# Patient Record
Sex: Female | Born: 1950
Health system: Southern US, Community
[De-identification: ages and names within clinical notes are randomized; demographics above are authoritative.]

## PROBLEM LIST (undated history)

## (undated) DIAGNOSIS — I509 Heart failure, unspecified: Secondary | ICD-10-CM

## (undated) DIAGNOSIS — I350 Nonrheumatic aortic (valve) stenosis: Secondary | ICD-10-CM

## (undated) DIAGNOSIS — I4891 Unspecified atrial fibrillation: Secondary | ICD-10-CM

## (undated) DIAGNOSIS — I4819 Other persistent atrial fibrillation: Secondary | ICD-10-CM

## (undated) DIAGNOSIS — I480 Paroxysmal atrial fibrillation: Secondary | ICD-10-CM

## (undated) DIAGNOSIS — T7840XA Allergy, unspecified, initial encounter: Secondary | ICD-10-CM

## (undated) DIAGNOSIS — R011 Cardiac murmur, unspecified: Secondary | ICD-10-CM

## (undated) DIAGNOSIS — I428 Other cardiomyopathies: Secondary | ICD-10-CM

## (undated) DIAGNOSIS — I10 Essential (primary) hypertension: Secondary | ICD-10-CM

## (undated) DIAGNOSIS — N189 Chronic kidney disease, unspecified: Secondary | ICD-10-CM

## (undated) HISTORY — DX: Essential (primary) hypertension: I10

## (undated) HISTORY — DX: Allergy, unspecified, initial encounter: T78.40XA

## (undated) HISTORY — DX: Chronic kidney disease, unspecified: N18.9

## (undated) HISTORY — DX: Other cardiomyopathies: I42.8

## (undated) HISTORY — DX: Other persistent atrial fibrillation: I48.19

## (undated) HISTORY — PX: TONSILLECTOMY: SUR1361

## (undated) HISTORY — DX: Paroxysmal atrial fibrillation: I48.0

## (undated) HISTORY — DX: Nonrheumatic aortic (valve) stenosis: I35.0

## (undated) HISTORY — DX: Cardiac murmur, unspecified: R01.1

---
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
## 1998-09-09 ENCOUNTER — Emergency Department (HOSPITAL_COMMUNITY): Admission: EM | Admit: 1998-09-09 | Discharge: 1998-09-09 | Payer: Self-pay | Admitting: Emergency Medicine

## 2004-11-23 ENCOUNTER — Ambulatory Visit: Payer: Self-pay

## 2005-01-05 ENCOUNTER — Ambulatory Visit: Payer: Self-pay

## 2005-01-05 IMAGING — MG UNKNOWN MG STUDY
1 series · 4 of 4 positions shown · non-contrast
Comparison: none

REASON FOR EXAM: screening
COMMENTS:

[R CC · right · 4 of 4 slices shown]
[im 1/4]
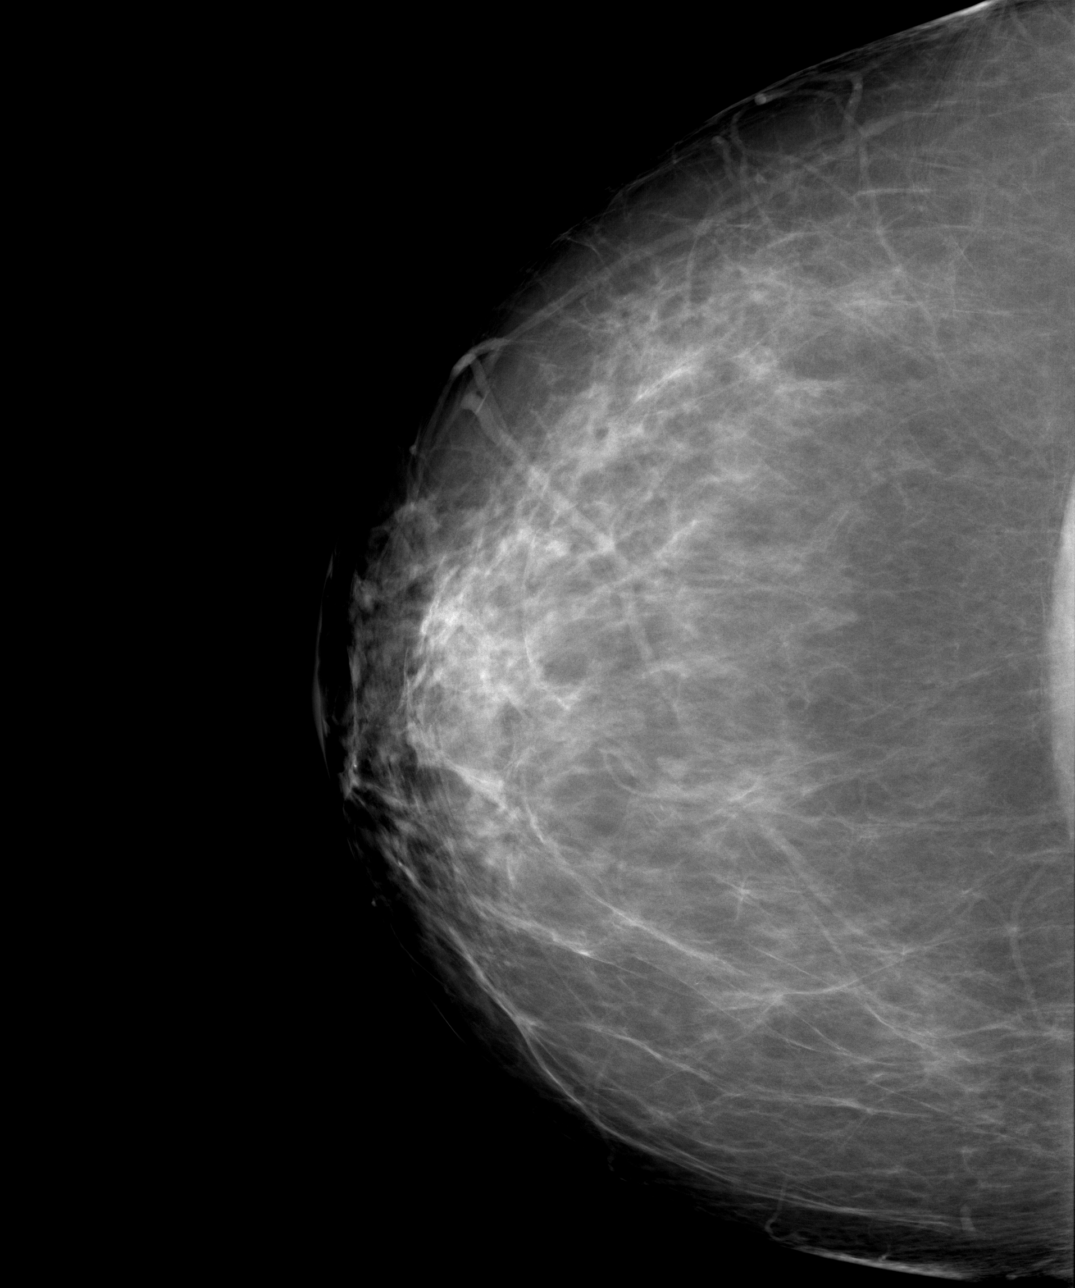
[im 2/4]
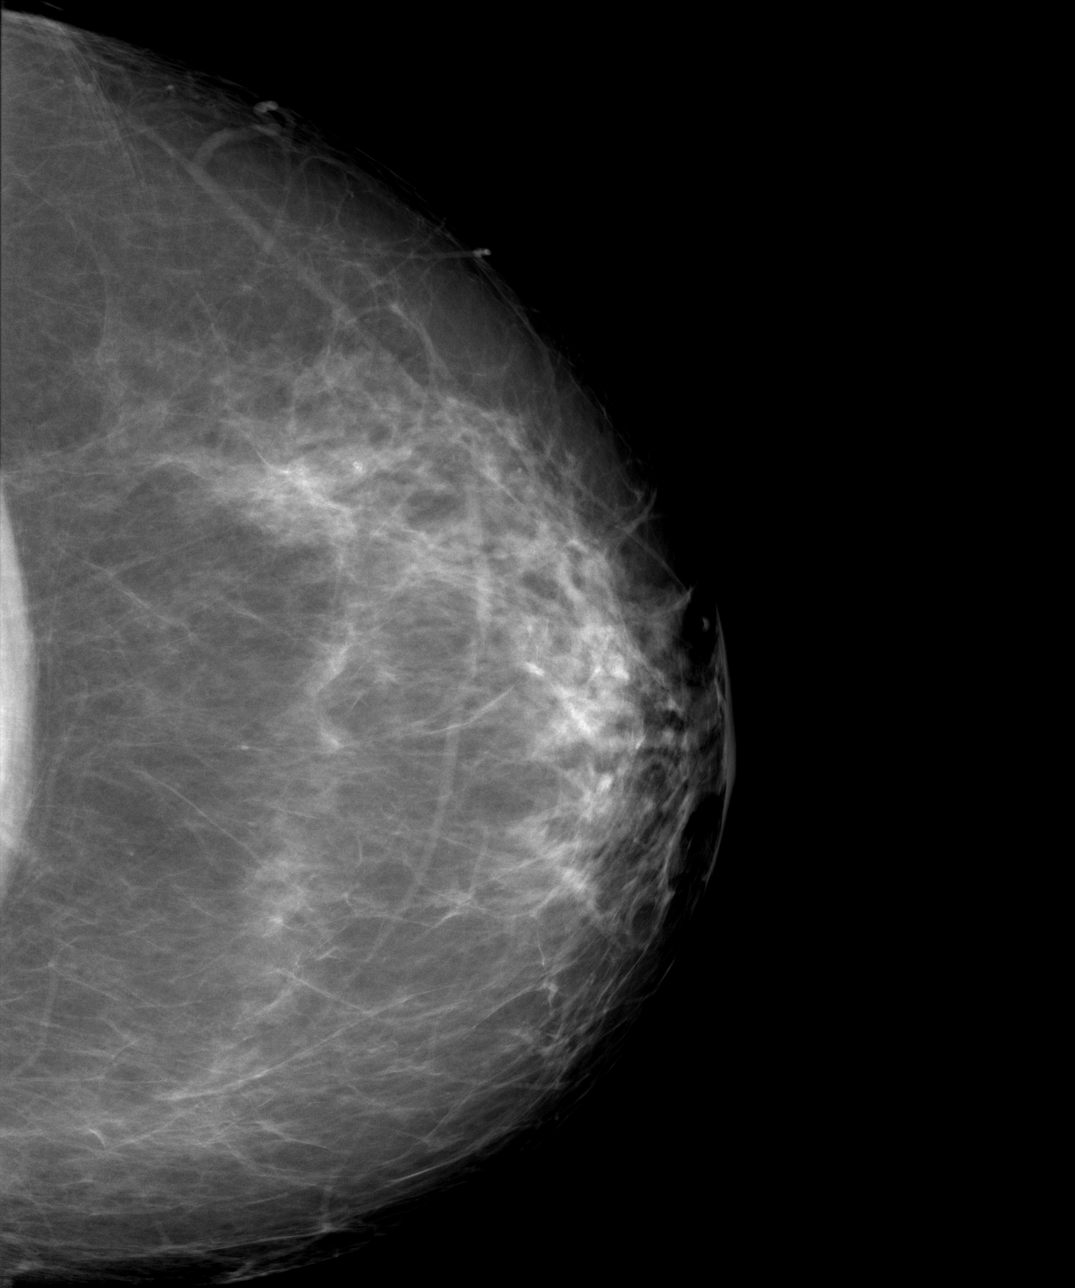
[im 3/4]
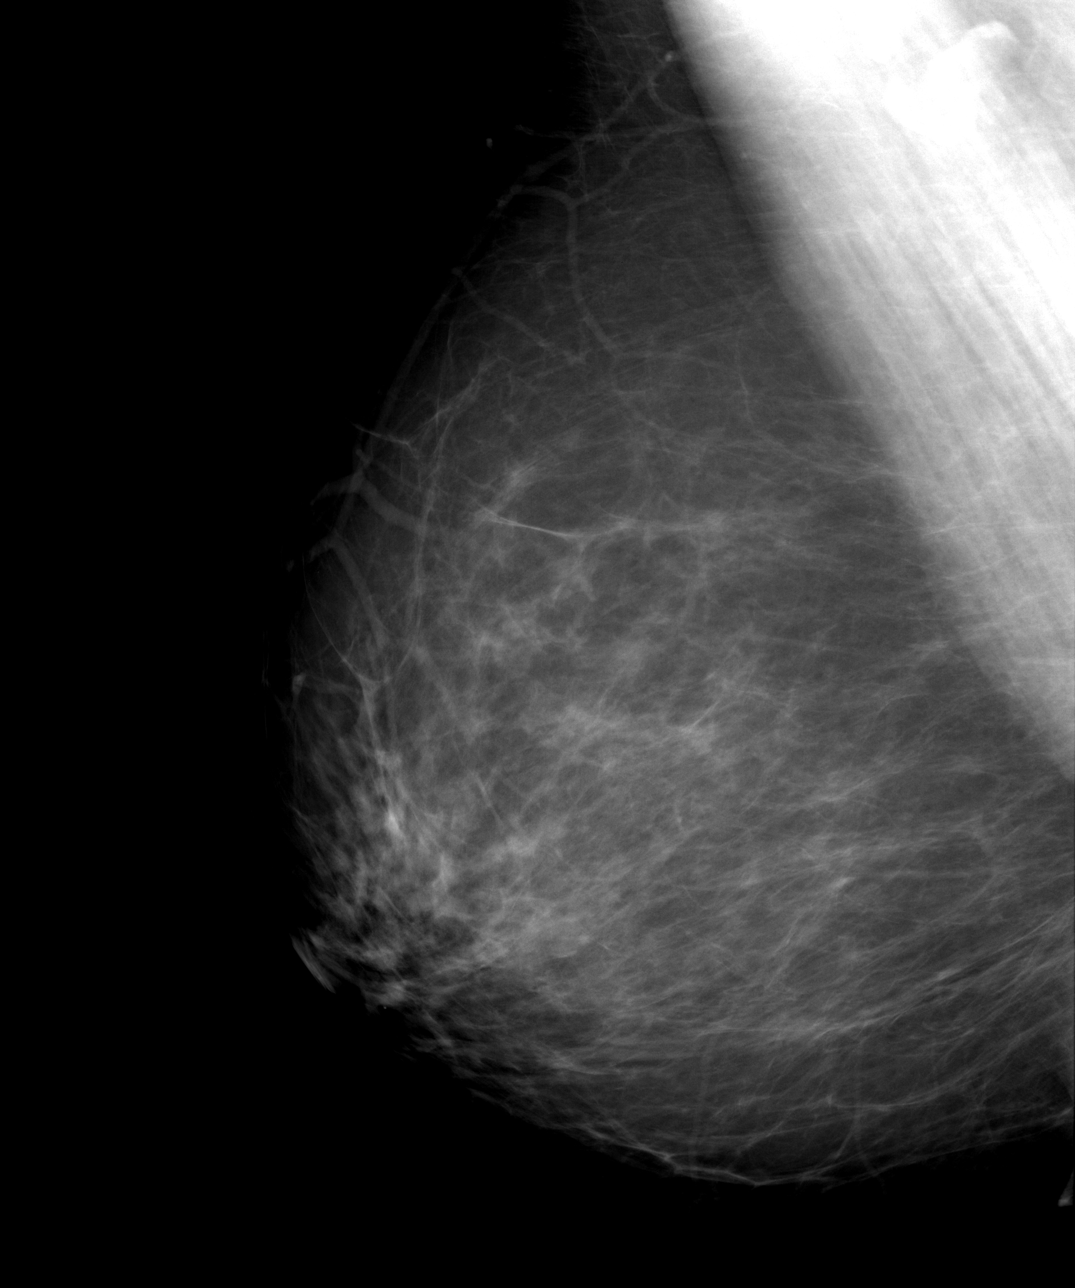
[im 4/4]
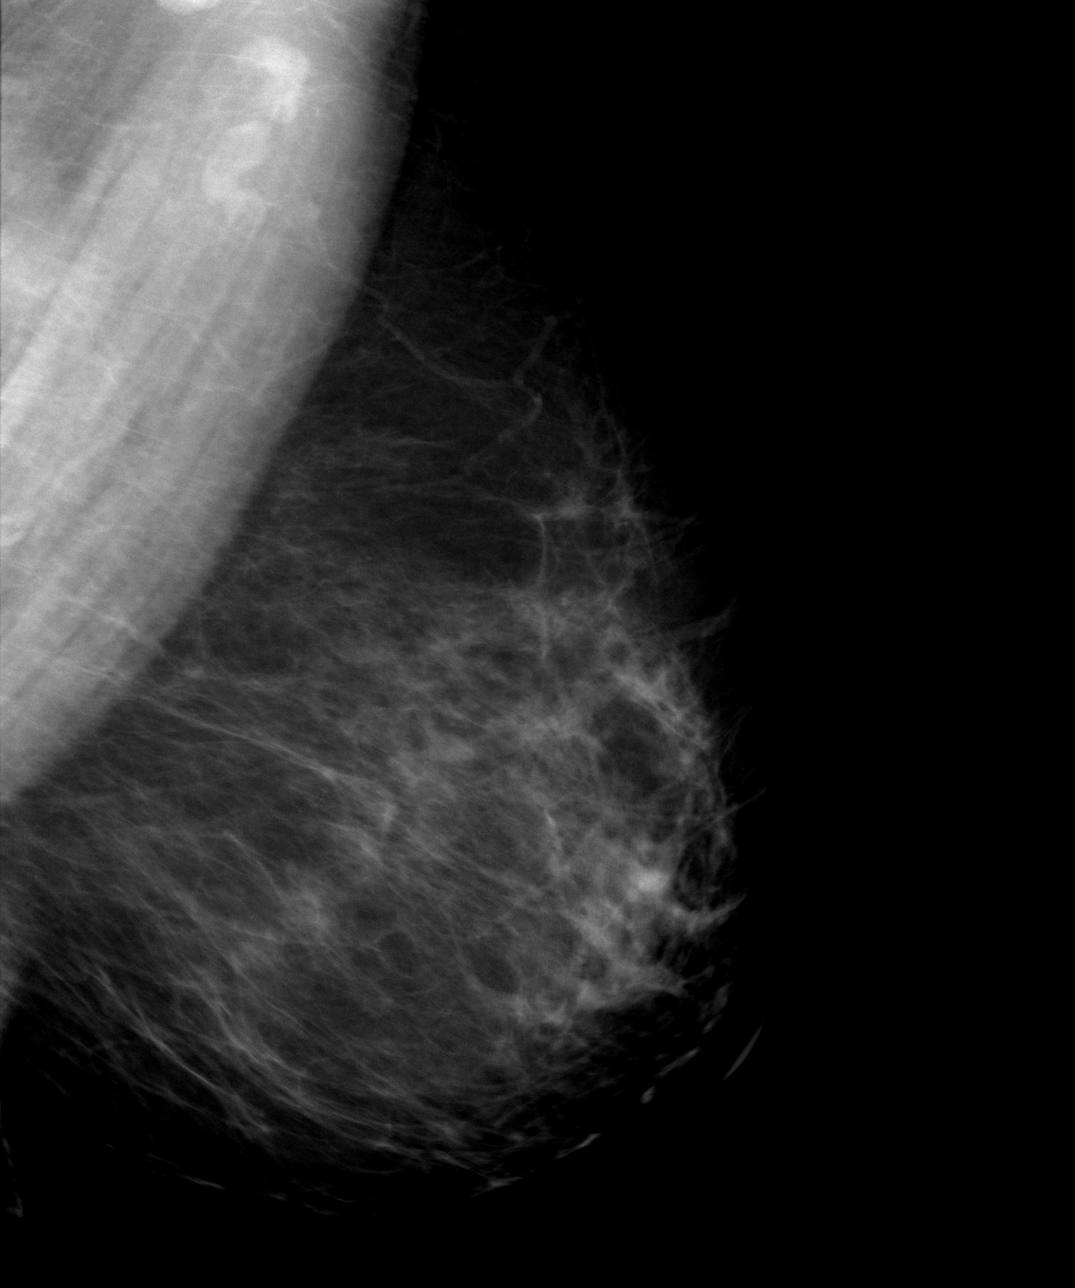

[4 of 4 positions shown; findings below may reference images not displayed]

PROCEDURE:     MAM - MAM DGTL SCREENING MAMMO W/CAD  - [DATE]  [DATE]

RESULT:     Comparison is made to the prior study of [DATE].  The breasts
exhibit a mildly dense parenchymal pattern. There is no evidence of an area
of developing density, dominant mass, or malignant appearing calcification.
The appearance is stable.
IMPRESSION: 1)Stable, benign appearing bilateral mammogram.

BI-RADS: Category 2-Benign Finding

RECOMMENDATIONS:

1)Please continue to encourage yearly mammographic follow-up.

A NEGATIVE MAMMOGRAM REPORT DOES NOT PRECLUDE BIOPSY OR OTHER EVALUATION OF
A CLINICALLY PALPABLE OR OTHERWISE SUSPICIOUS MASS OR LESION.  BREAST CANCER
MAY NOT BE DETECTED BY MAMMOGRAPHY IN UP TO 10% OF CASES.

## 2005-09-06 ENCOUNTER — Ambulatory Visit: Payer: Self-pay

## 2005-09-06 IMAGING — CR DG CHEST 2V
1 series · 2 of 2 positions shown · non-contrast
Comparison: none

REASON FOR EXAM: PAIN , COUGH, CONGESTION
COMMENTS:

[Series 1: view not recorded · 0.17mm/px · 2 of 2 slices shown]
[im 1/2]
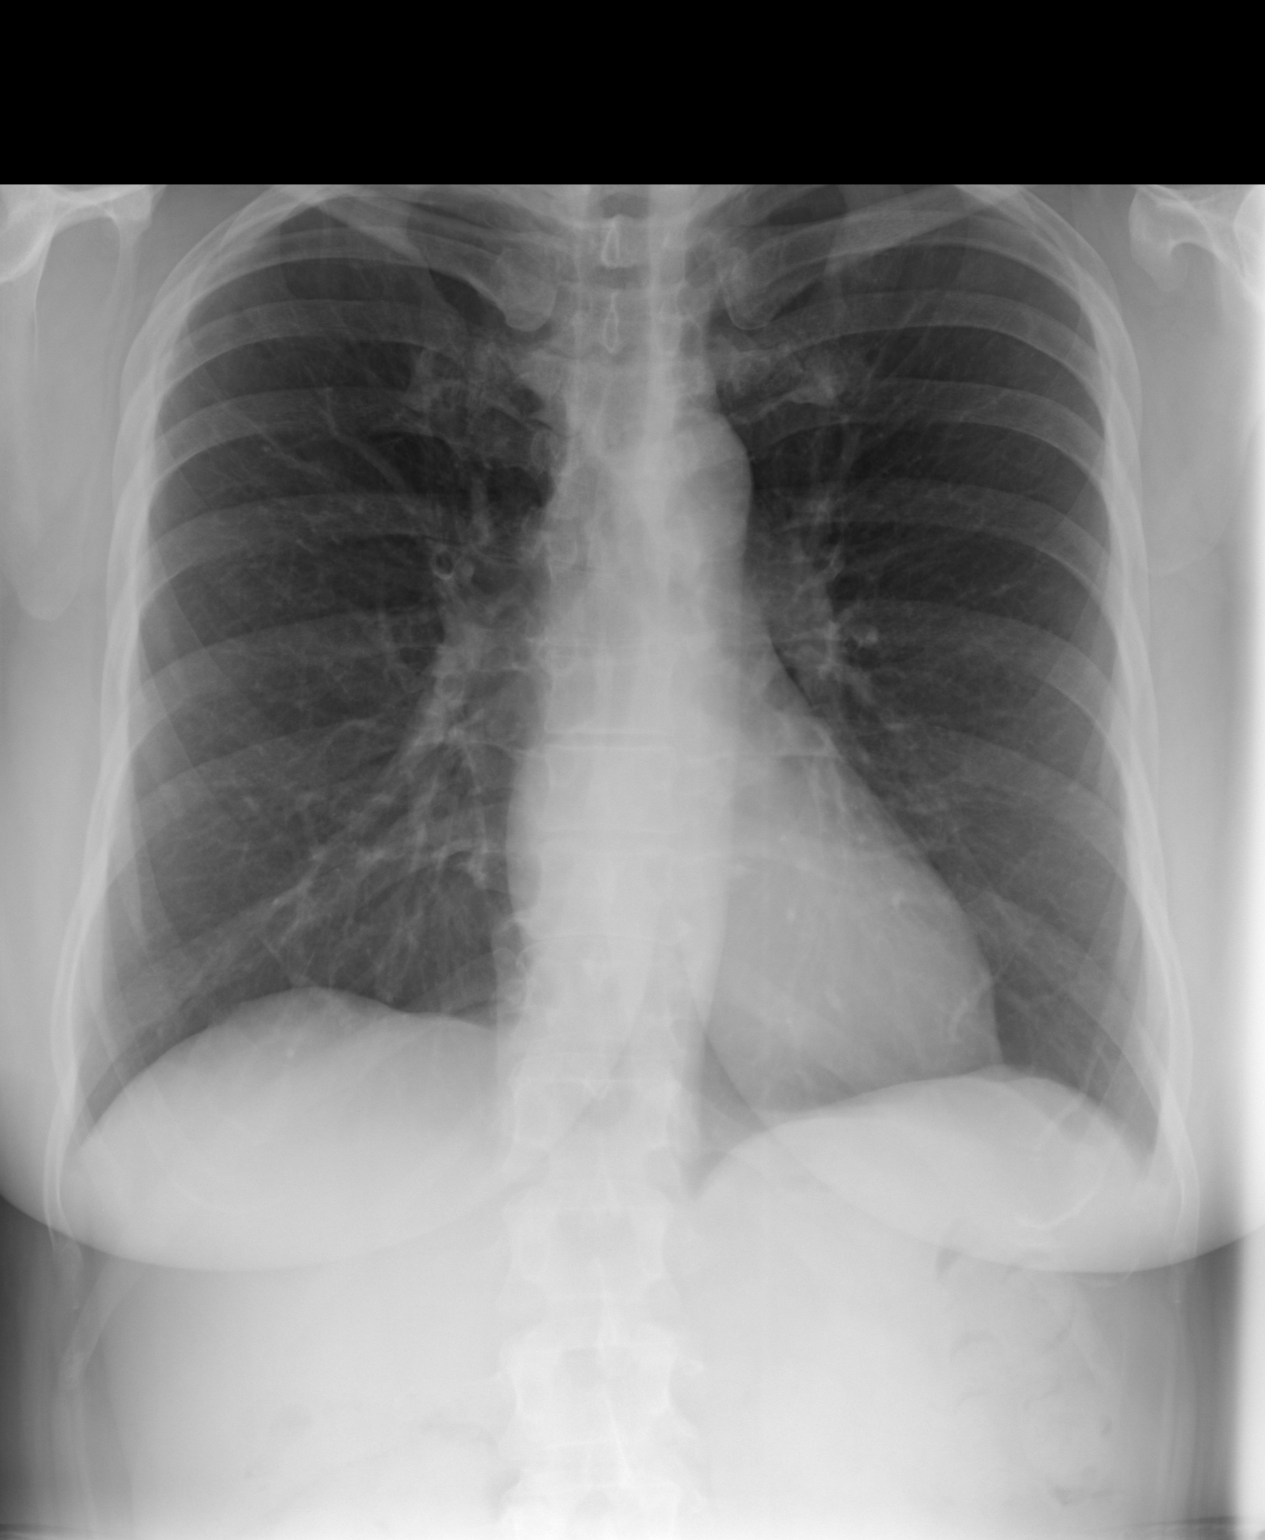
[im 2/2]
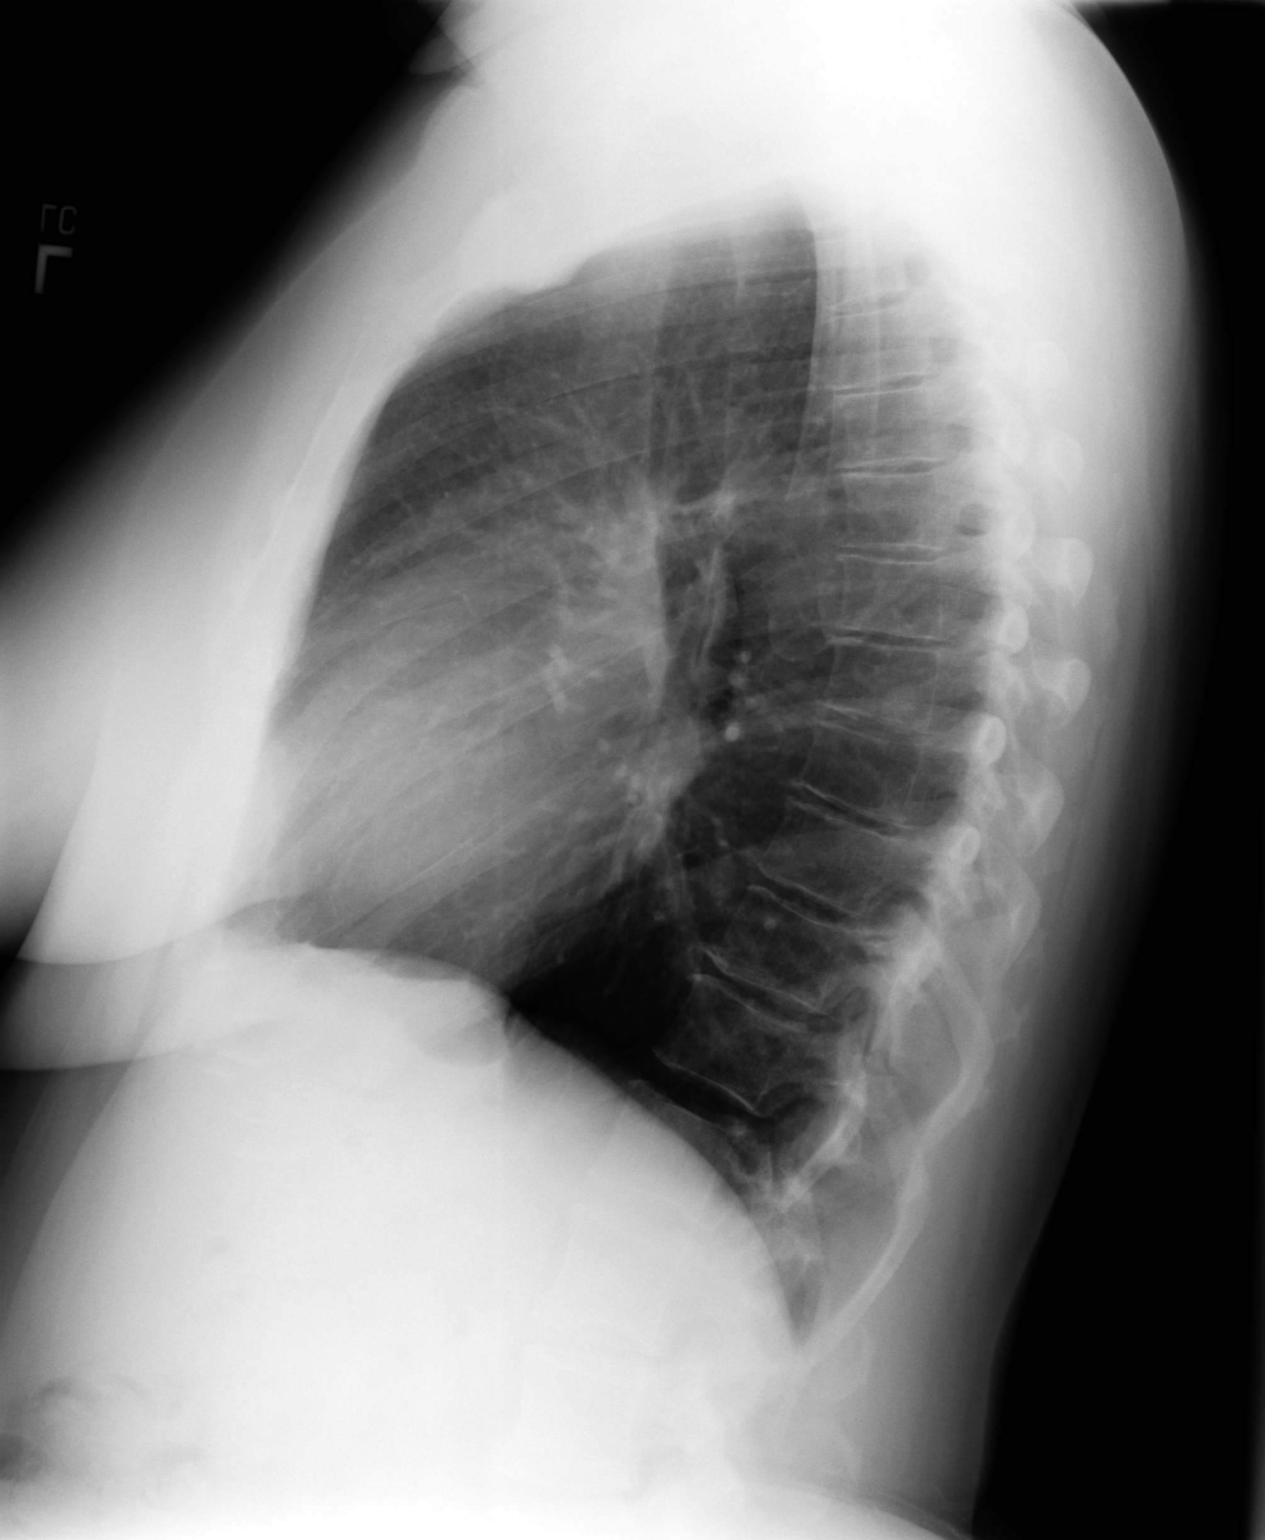

[2 of 2 positions shown; findings below may reference images not displayed]

PROCEDURE:     DXR - DXR CHEST PA (OR AP) AND LATERAL  - [DATE]  [DATE]

RESULT:     Two views of the chest show the lungs are clear. The heart and
pulmonary vessels are normal. The bony structures are unremarkable. There is
some slight prominence of the lung markings in the RIGHT lung base which
could represent minimal atelectasis or fibrosis but there is no definite
infiltrate.
IMPRESSION: No acute cardiopulmonary disease. I cannot exclude some
RIGHT basilar atelectasis or fibrosis.

## 2005-09-06 IMAGING — CR PARANASAL SINUSES - COMPLETE 3 + VIEW
1 series · 5 of 5 positions shown · non-contrast
Comparison: none

REASON FOR EXAM: PAIN,COUGH, CONGESTION
COMMENTS:

[Series 1: view not recorded · 0.17mm/px · 5 of 5 slices shown]
[im 1/5]
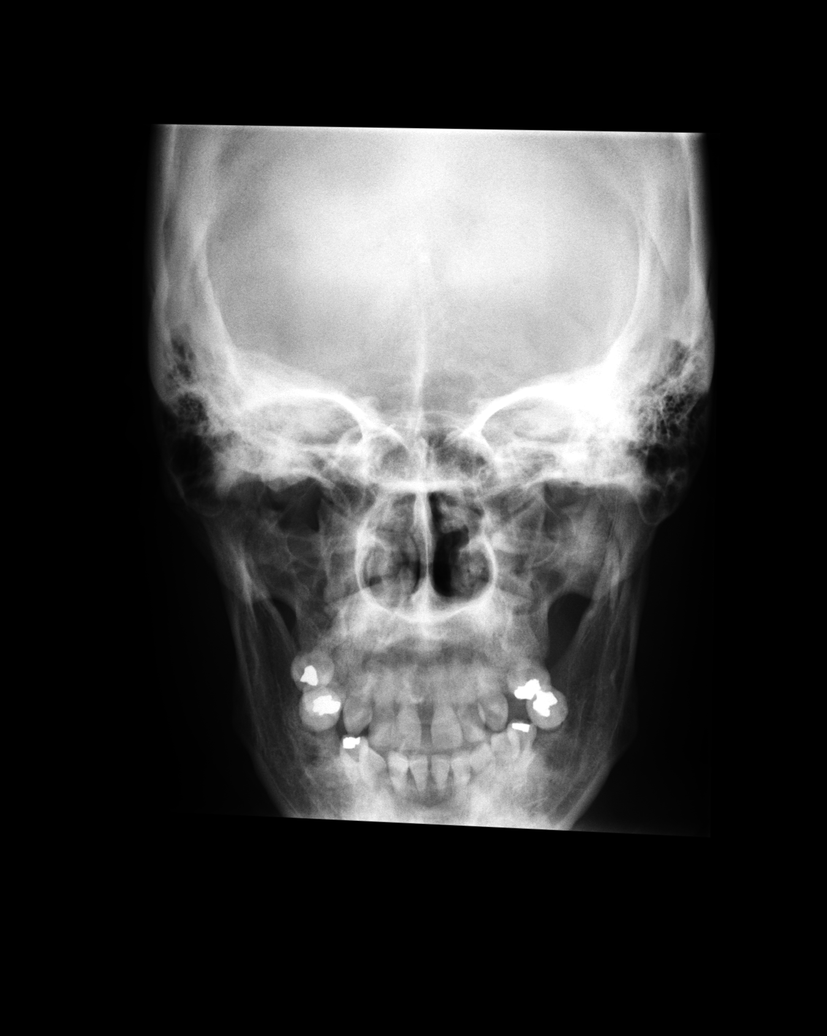
[im 2/5]
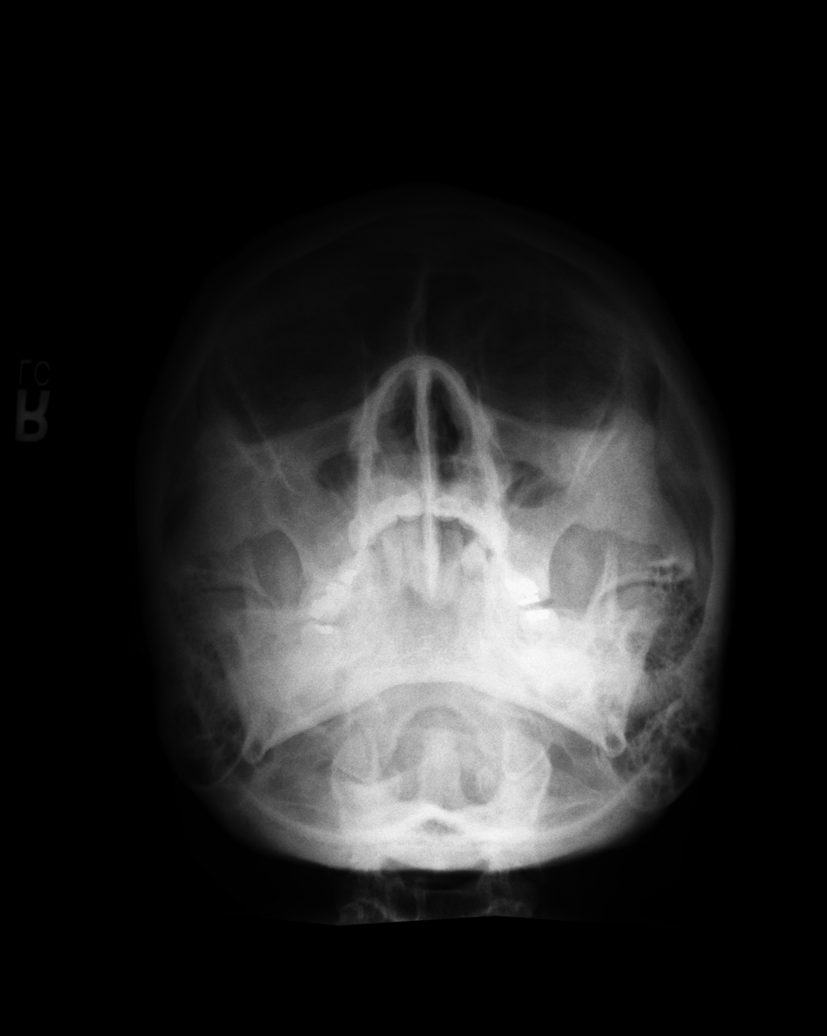
[im 3/5]
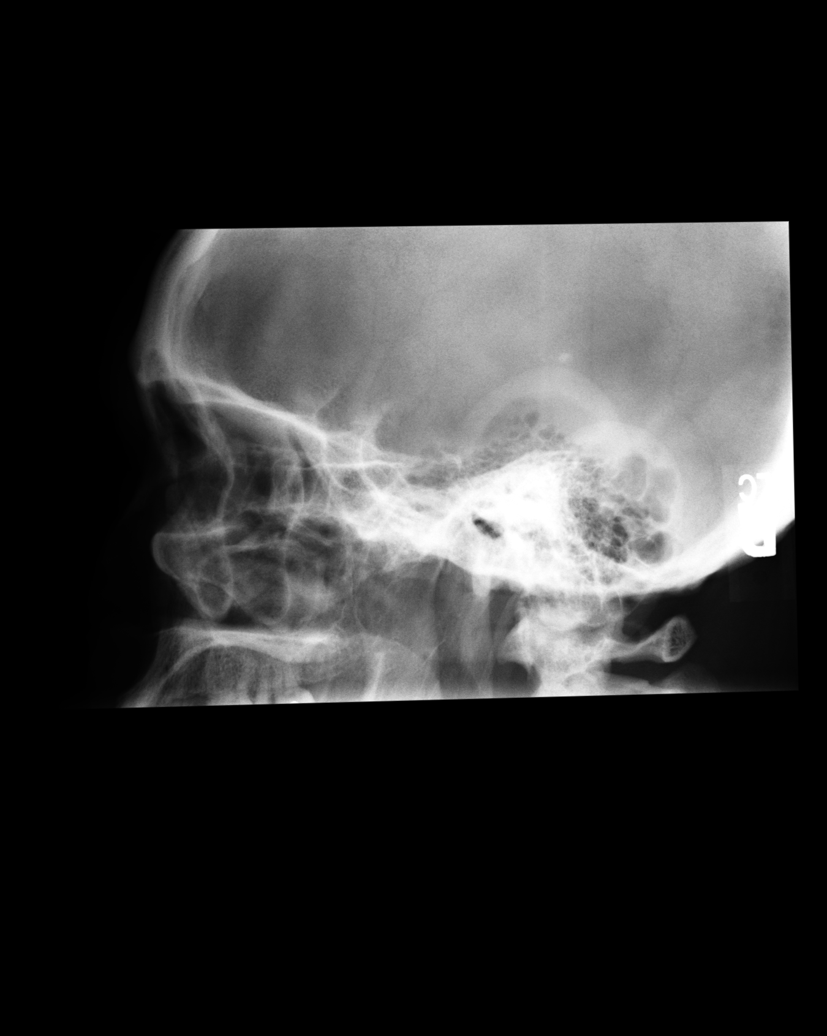
[im 4/5]
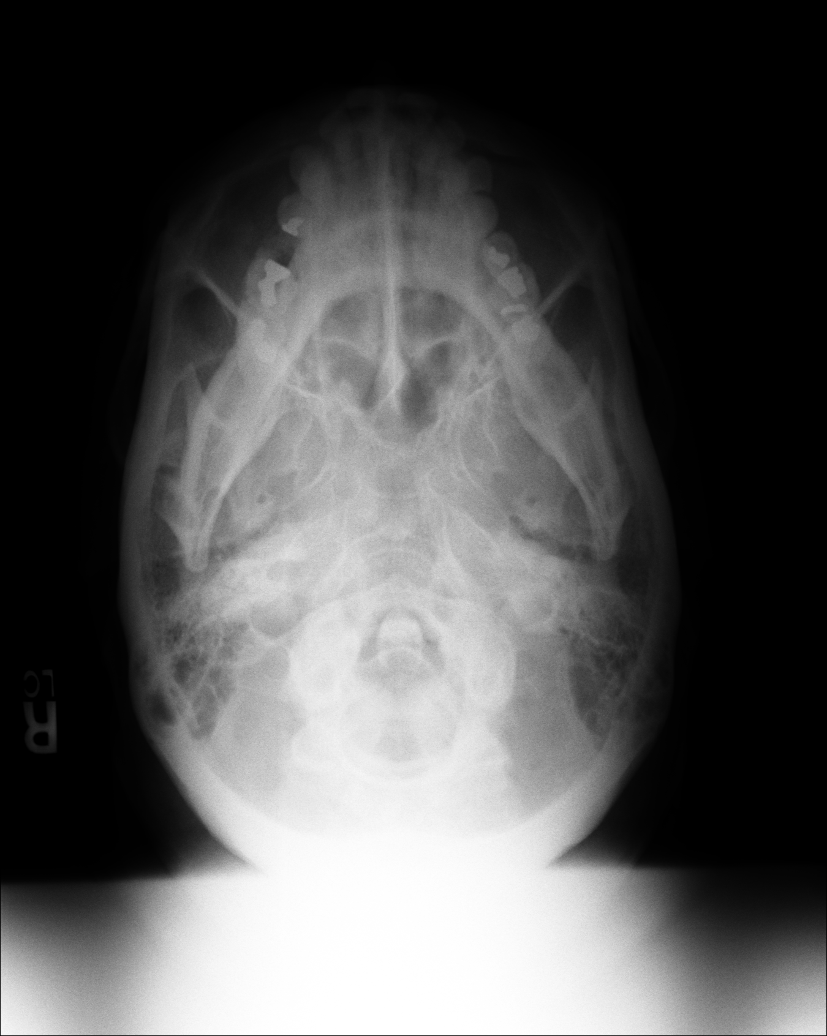
[im 5/5]
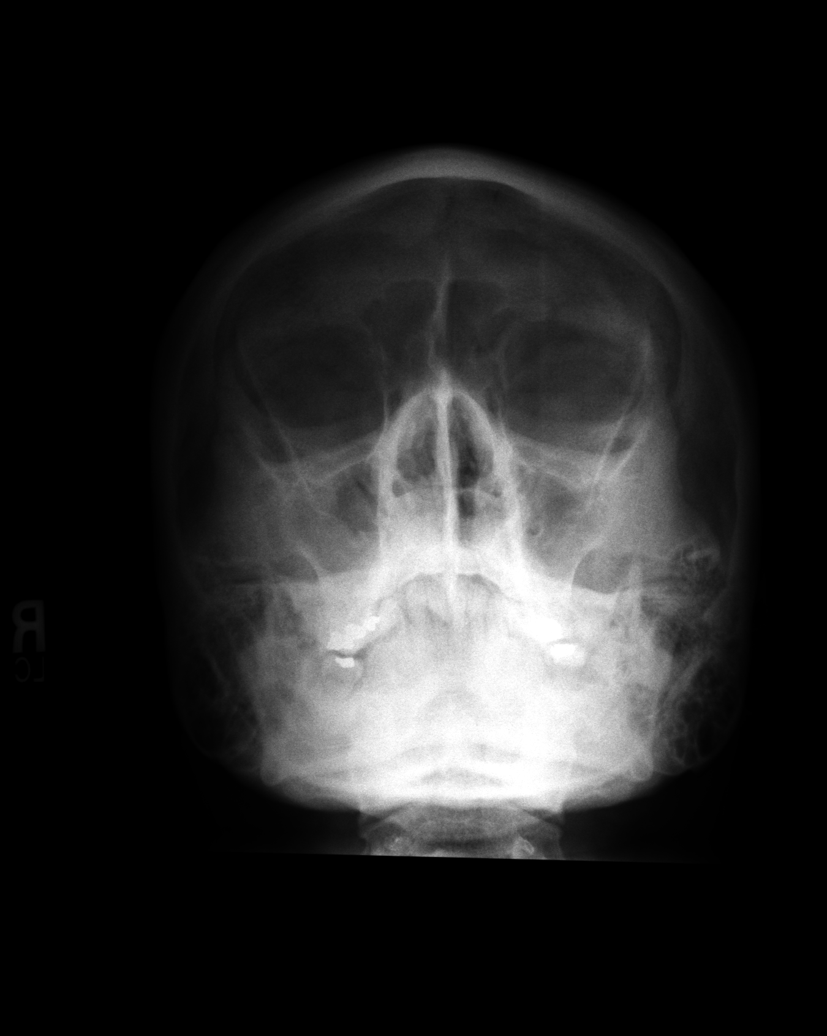

[5 of 5 positions shown; findings below may reference images not displayed]

PROCEDURE:     DXR - DXR SINUSES PARANASAL COMPLETE  - [DATE]  [DATE]

RESULT:        Routine sinus series demonstrates evidence of mild mucosal
thickening in the maxillary sinuses.  Sinusitis cannot be excluded.  If
there is continued clinical concern,  then correlation with CT of the
sinuses would be suggested.
IMPRESSION: See above.

## 2006-06-28 ENCOUNTER — Ambulatory Visit: Payer: Self-pay

## 2006-06-28 ENCOUNTER — Ambulatory Visit: Payer: Self-pay | Admitting: Internal Medicine

## 2006-06-28 IMAGING — MG UNKNOWN MG STUDY
1 series · 4 of 4 positions shown · non-contrast
Comparison: none

REASON FOR EXAM: screening mammogram
COMMENTS:

[Series 6266: R CC · right · 4 of 4 slices shown]
[im 1/4]
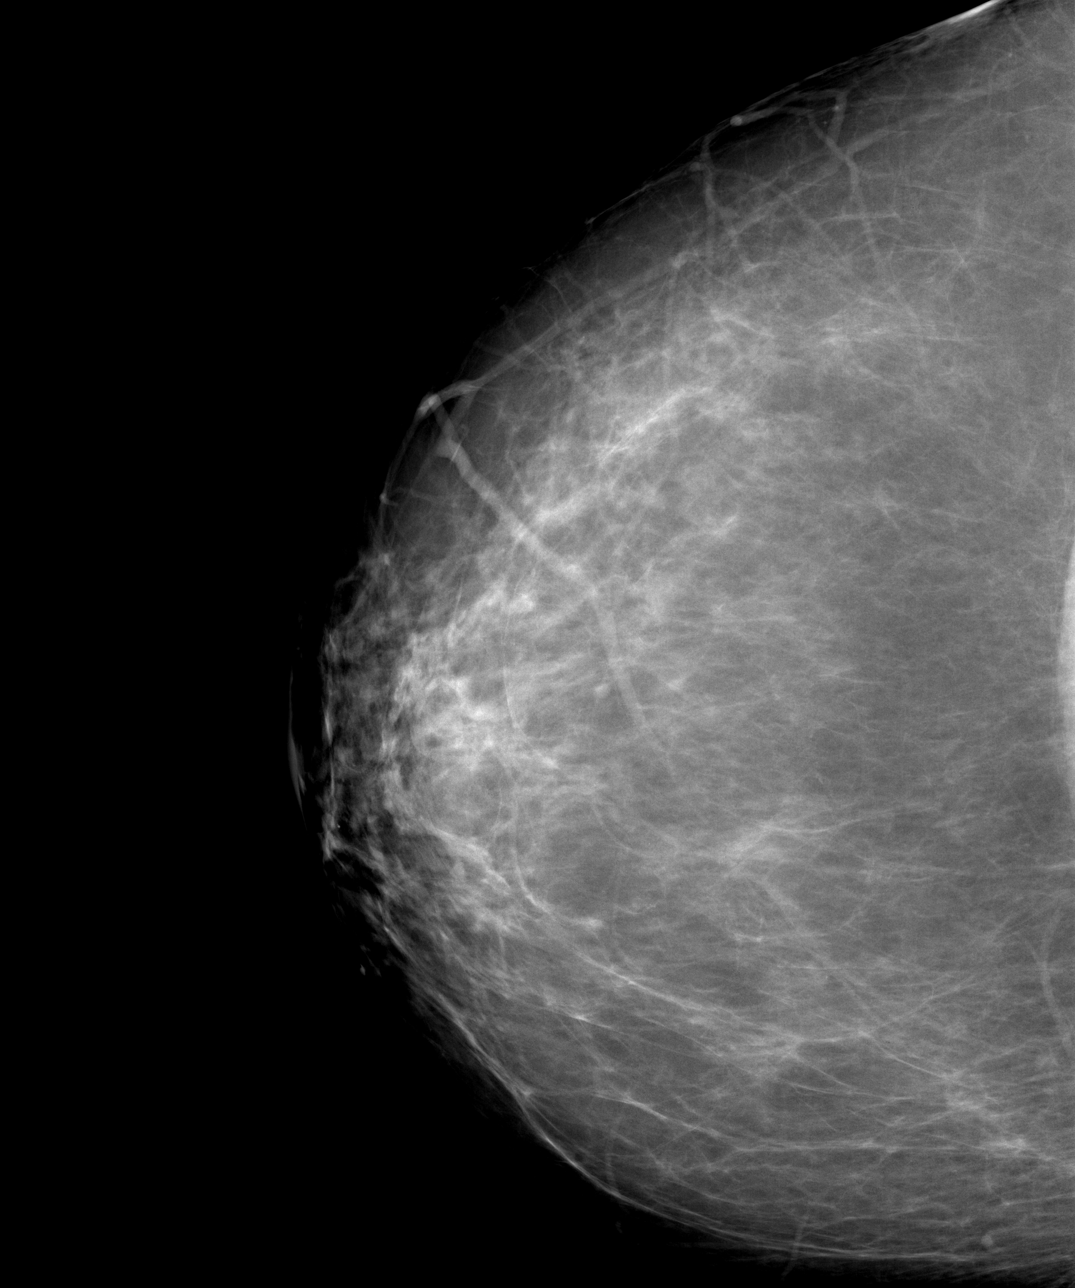
[im 2/4]
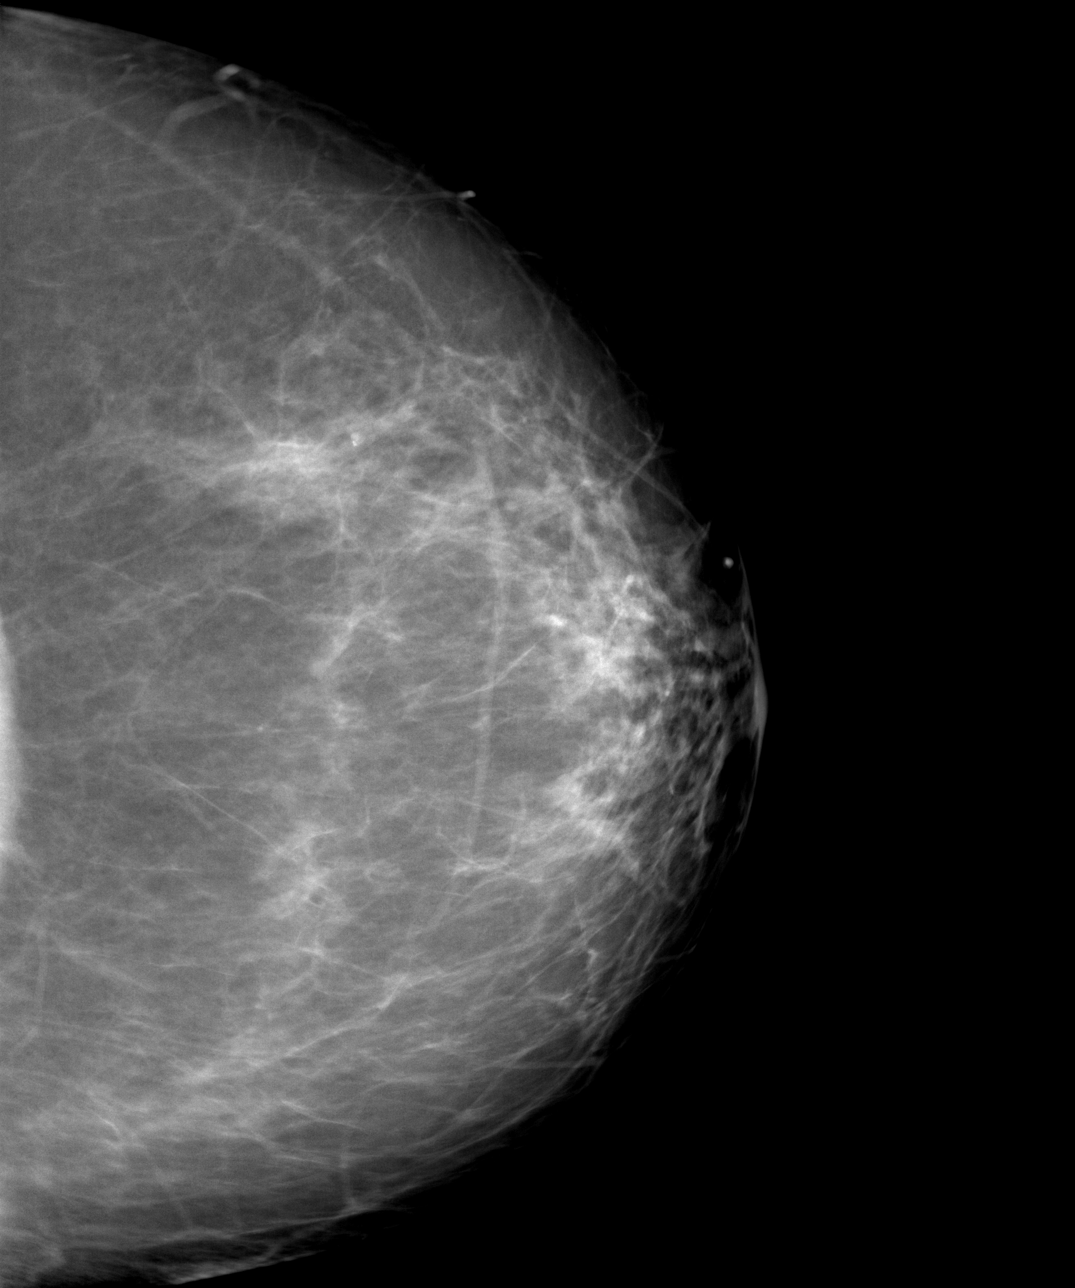
[im 3/4]
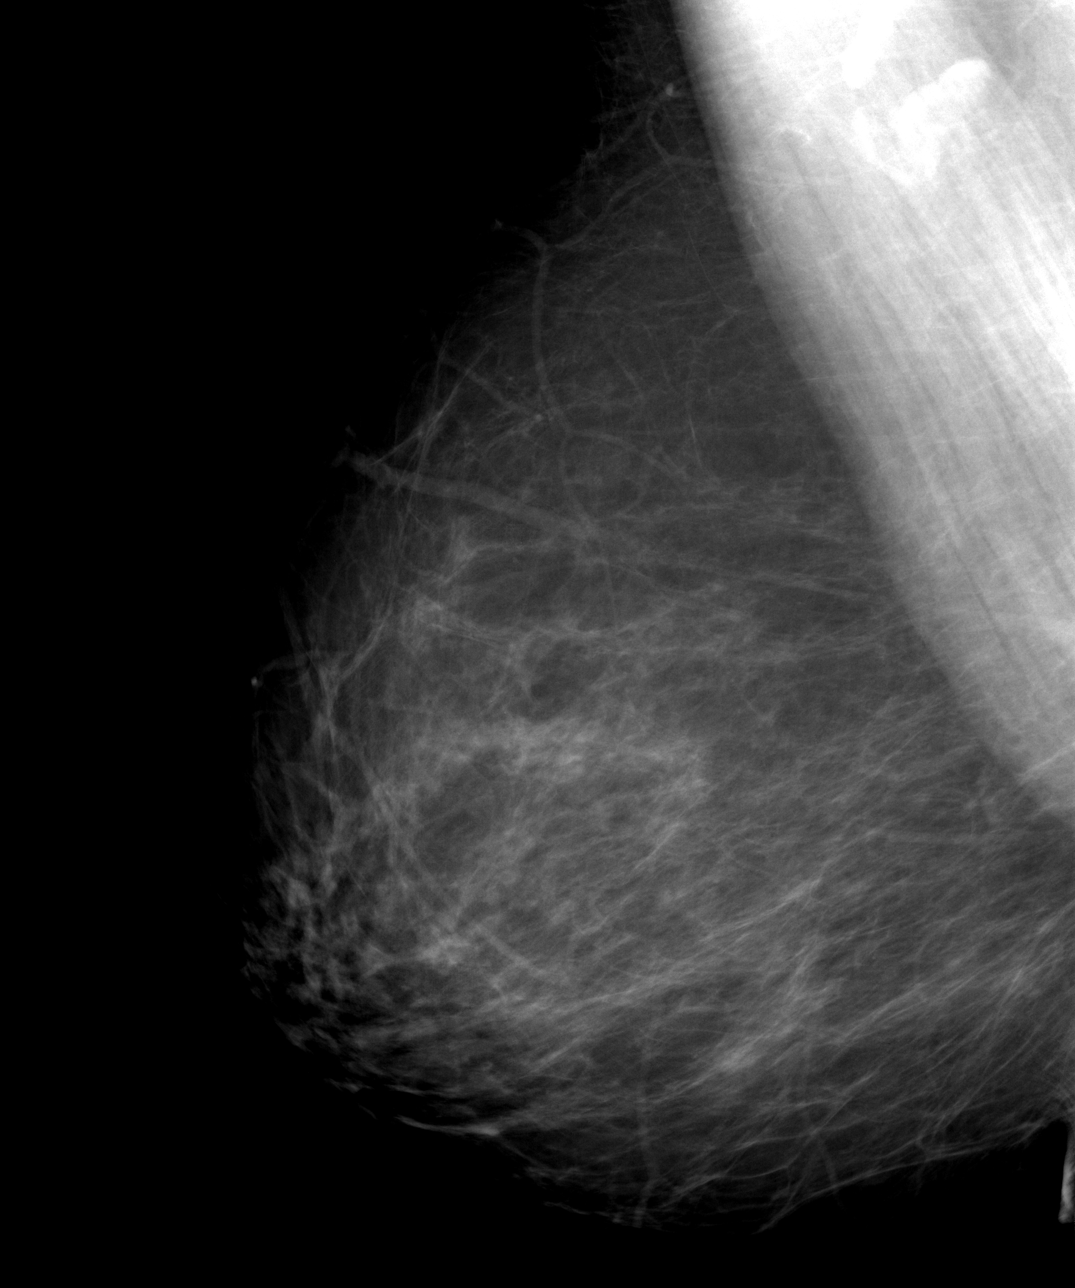
[im 4/4]
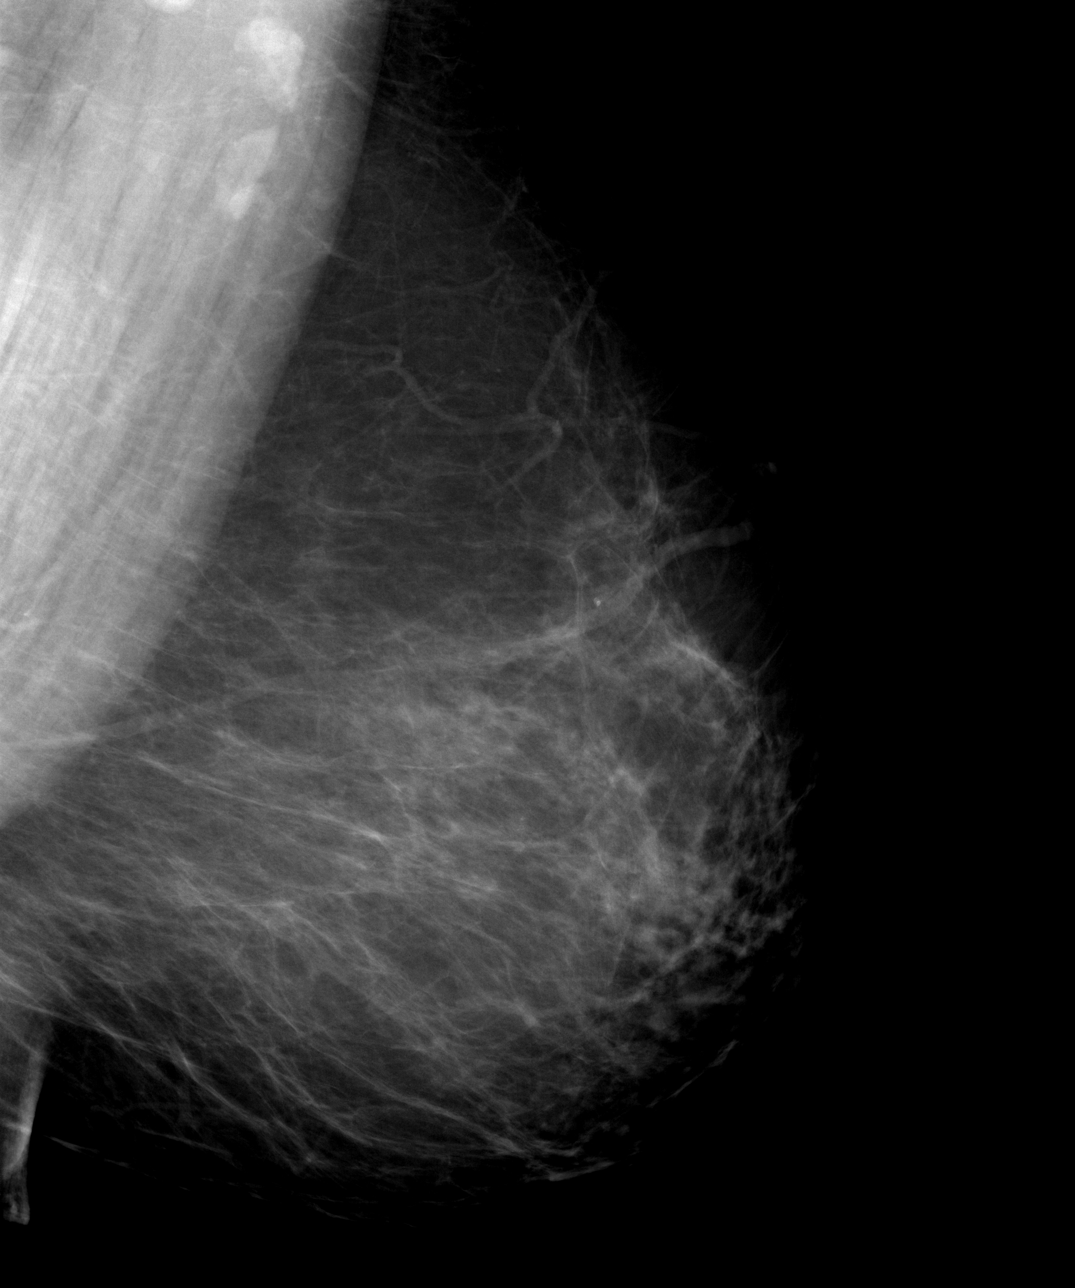

[4 of 4 positions shown; findings below may reference images not displayed]

PROCEDURE:     MAM - MAM DGTL SCREENING MAMMO W/CAD  - [DATE]  [DATE]

RESULT:          Comparison is made to the film-screen study of [DATE]
and to previous digital exam of [DATE].

The breasts exhibit a moderately dense parenchymal pattern and show no area
of developing density, dominant mass, or malignant-appearing calcification.
The appearance is stable.  There is no architectural distortion.
IMPRESSION: Stable, benign-appearing bilateral mammogram. Please
continue to encourage annual mammographic followup.

BI-RADS:  Category 2 - Benign finding

A NEGATIVE MAMMOGRAM REPORT DOES NOT PRECLUDE BIOPSY OR OTHER EVALUATION OF
A CLINICALLY PALPABLE OR OTHERWISE SUSPICIOUS MASS OR LESION.  BREAST CANCER
MAY NOT BE DETECTED BY MAMMOGRAPHY IN UP TO 10% OF CASES.

## 2006-07-10 ENCOUNTER — Ambulatory Visit: Payer: Self-pay | Admitting: Internal Medicine

## 2008-09-03 ENCOUNTER — Ambulatory Visit: Payer: Self-pay

## 2008-09-03 IMAGING — MG MAM DGTL SCREENING MAMMO W/CAD
1 series · 7 of 7 positions shown · non-contrast
Comparison: none

REASON FOR EXAM: screening mammo
COMMENTS:

[R CC · right · 7 of 7 slices shown]
[im 1/7]
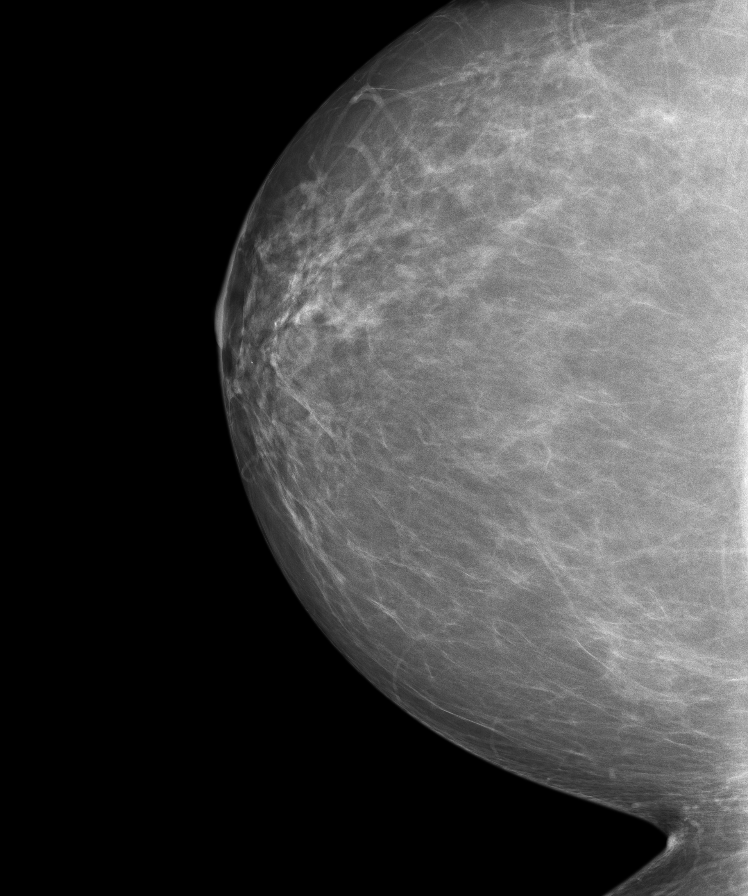
[im 2/7]
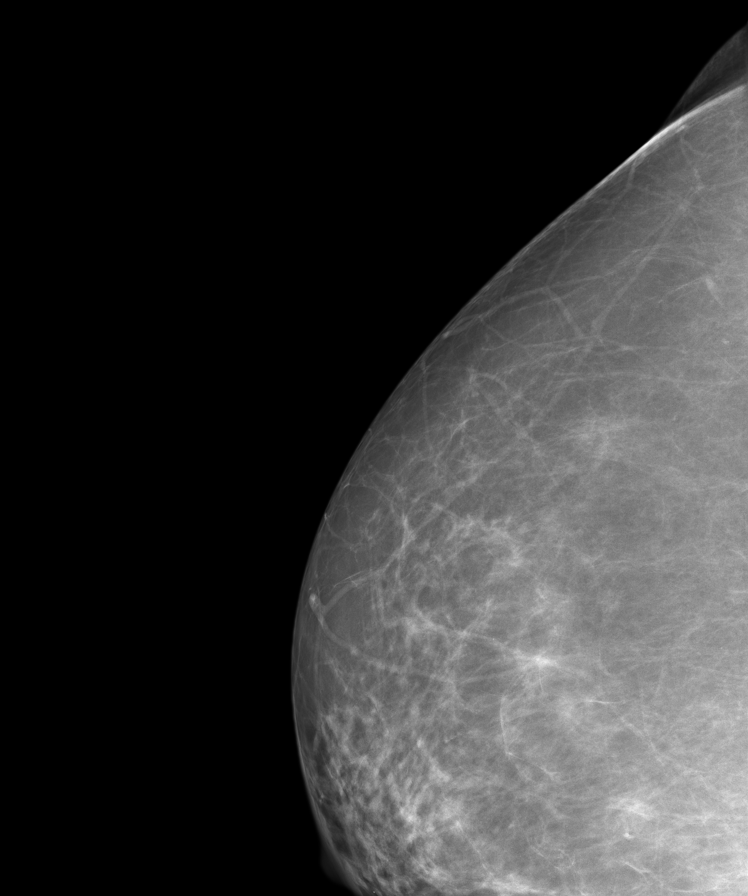
[im 3/7]
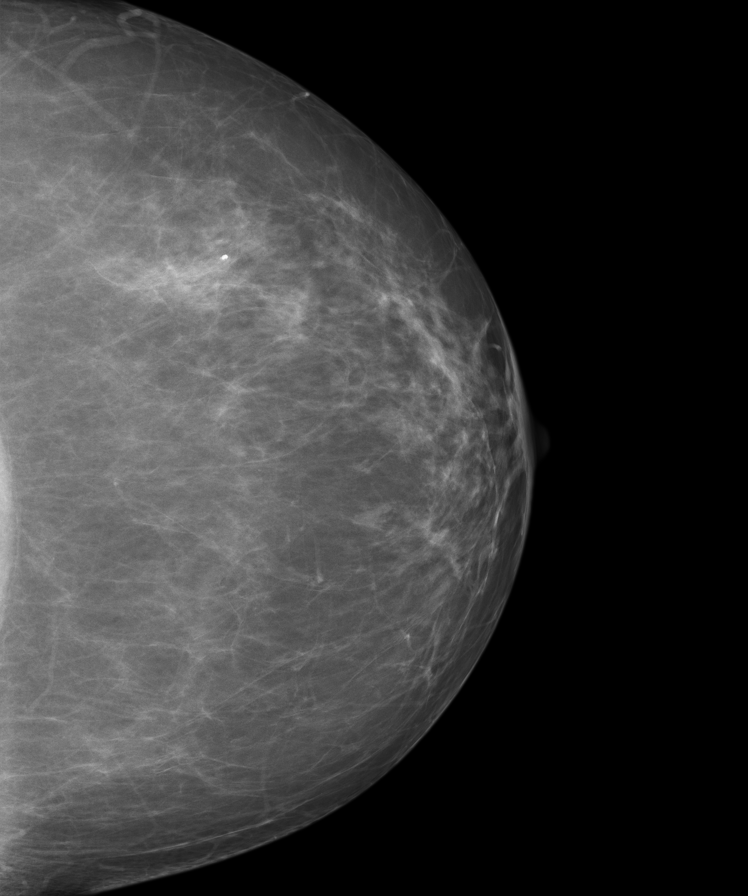
[im 4/7]
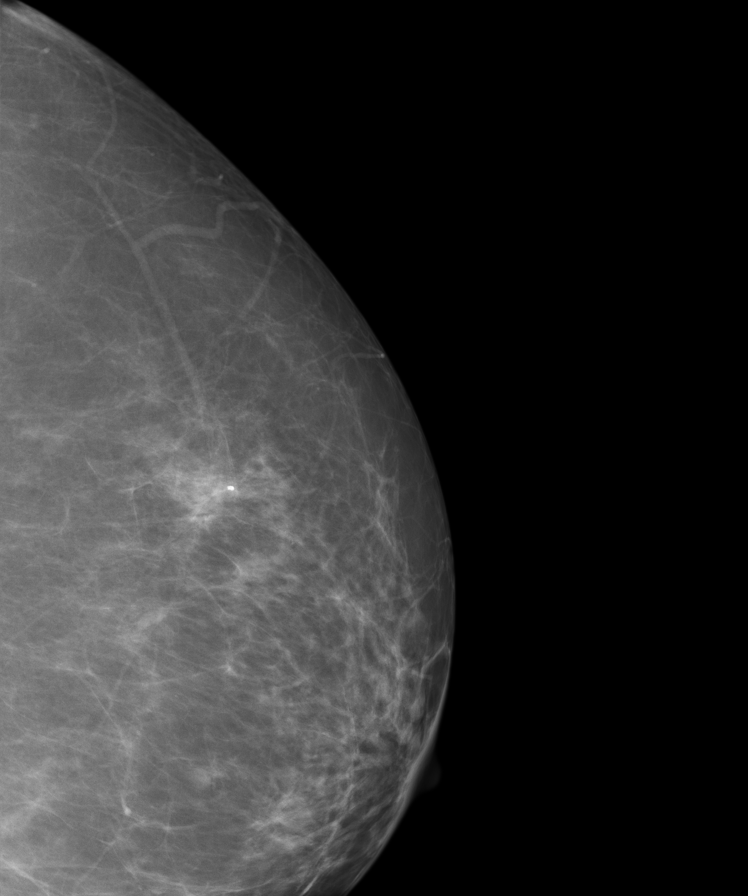
[im 5/7]
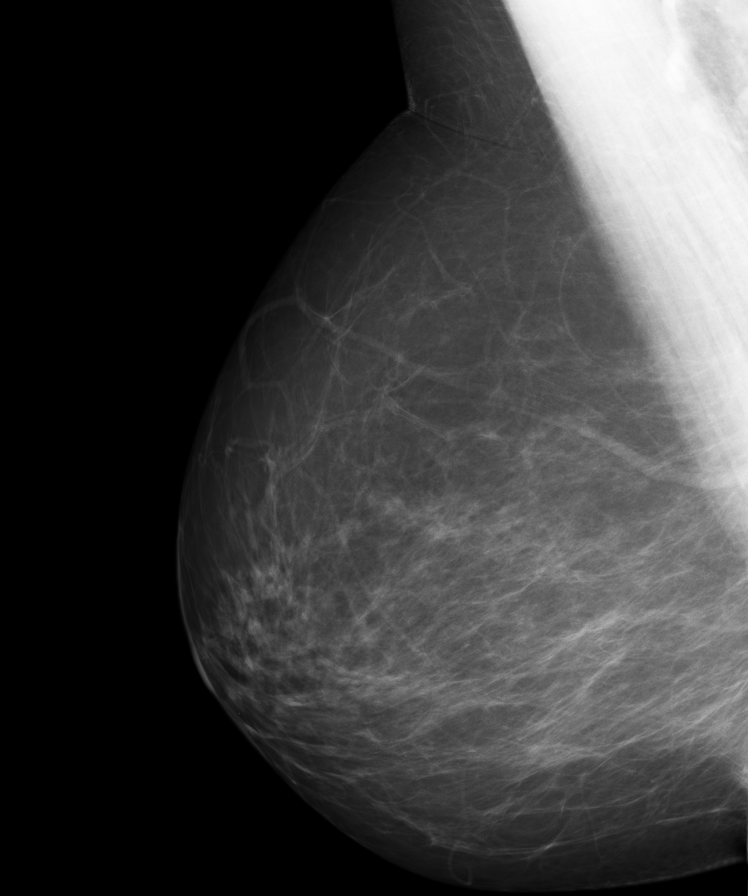
[im 6/7]
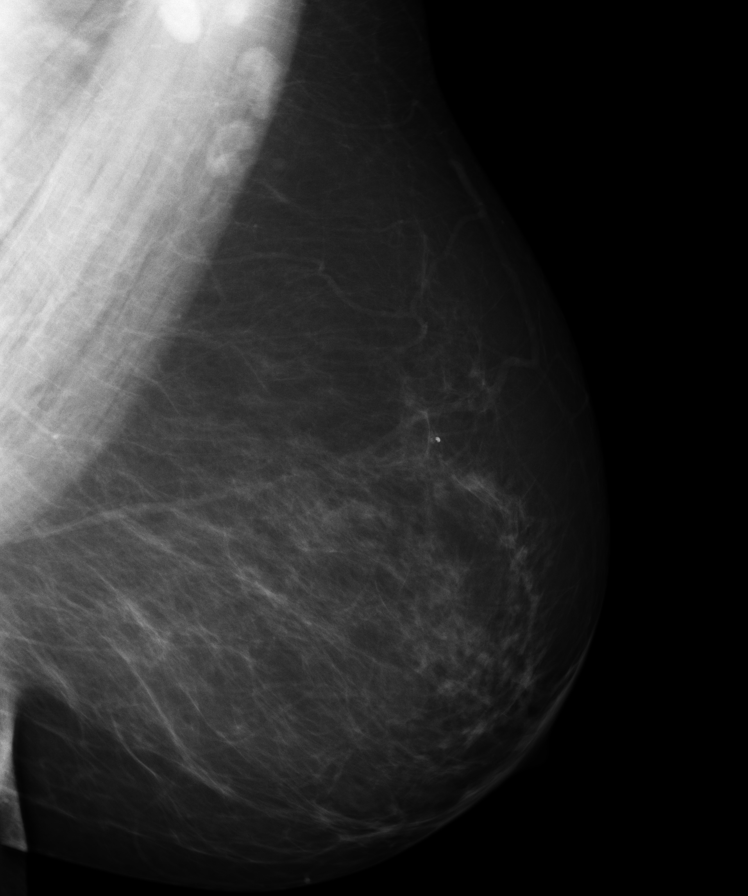
[im 7/7]
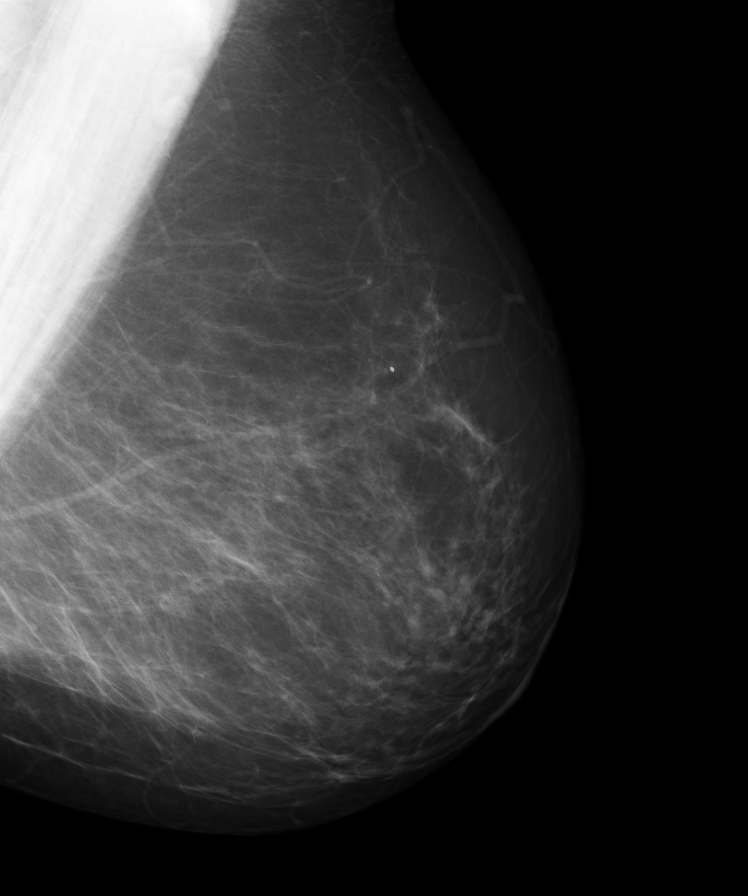

[7 of 7 positions shown; findings below may reference images not displayed]

PROCEDURE:     MAM - MAM DGTL SCREENING MAMMO W/CAD  - [DATE]  [DATE]

RESULT:     Comparison is made to a prior digital study [DATE],[DATE], and to a film screening study dated [DATE].

The breasts exhibit a moderately dense parenchymal pattern. A benign
appearing macrocalcification is present in the upper outer aspect of the
LEFT breast. There are no malignant appearing groupings of
microcalcification and I see no areas of new architectural distortion.
IMPRESSION: 1.I see no finding suspicious for malignancy.

BI-RADS: Category 2-Benign Finding

RECOMMENDATIONS:

1.     Please continue to encourage annual mammographic follow-up.

A NEGATIVE MAMMOGRAM REPORT DOES NOT PRECLUDE BIOPSY OR OTHER EVALUATION OF
A CLINICALLY PALPABLE OR OTHERWISE SUSPICIOUS MASS OR LESION. BREAST CANCER
MAY NOT BE DETECTED BY MAMMOGRAPHY IN UP TO 10% OF CASES.

## 2009-09-07 ENCOUNTER — Ambulatory Visit: Payer: Self-pay

## 2009-09-07 IMAGING — MG MM CAD SCREENING MAMMO
1 series · 8 of 8 positions shown · non-contrast
Comparison: none

REASON FOR EXAM: SCR MAMMO
COMMENTS:

PROCEDURE:     MAM - MAM DGTL SCREENING MAMMO W/CAD  - [DATE]  [DATE]
RESULT:     Comparison is made to prior studies dated [DATE], [DATE] and
[DATE].
The breasts demonstrate a moderate, heterogeneous parenchymal pattern. There
is no radiographic evidence to suggest malignancy.

[R CC · right · 8 of 8 slices shown]
[im 1/8]
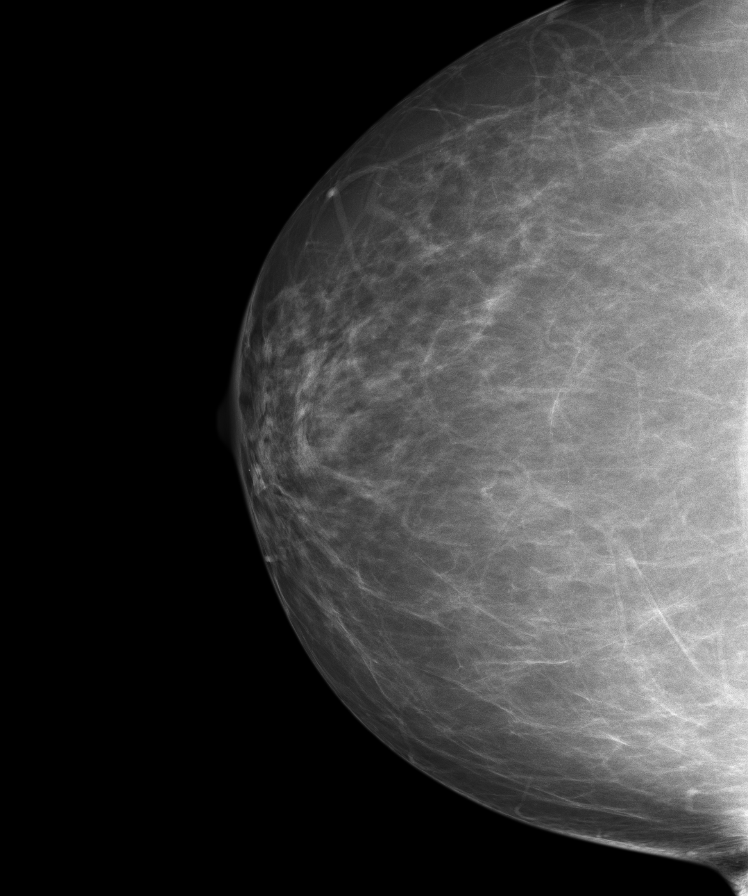
[im 2/8]
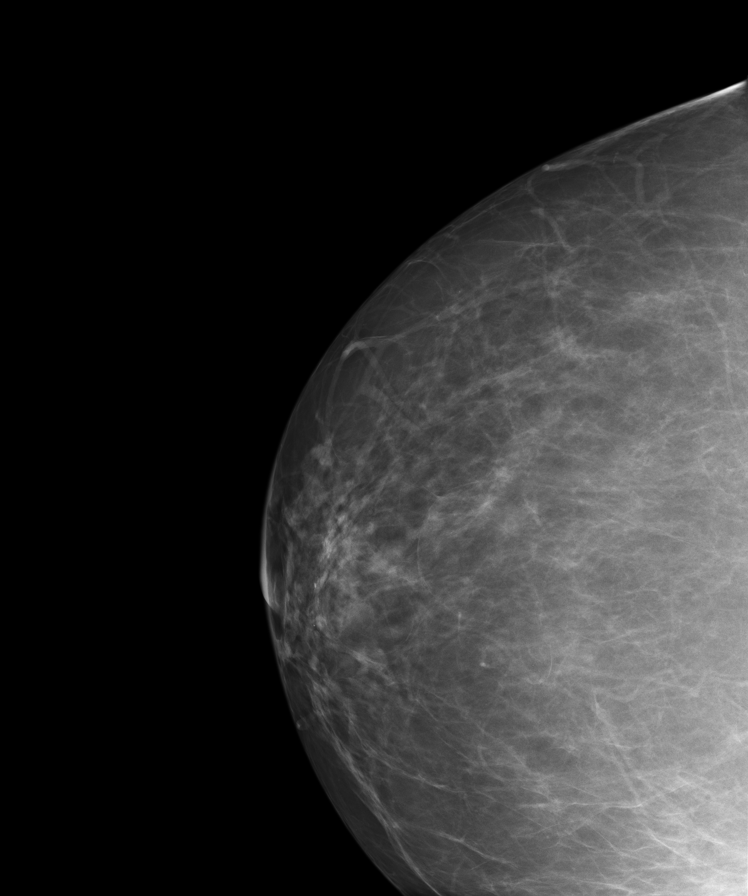
[im 3/8]
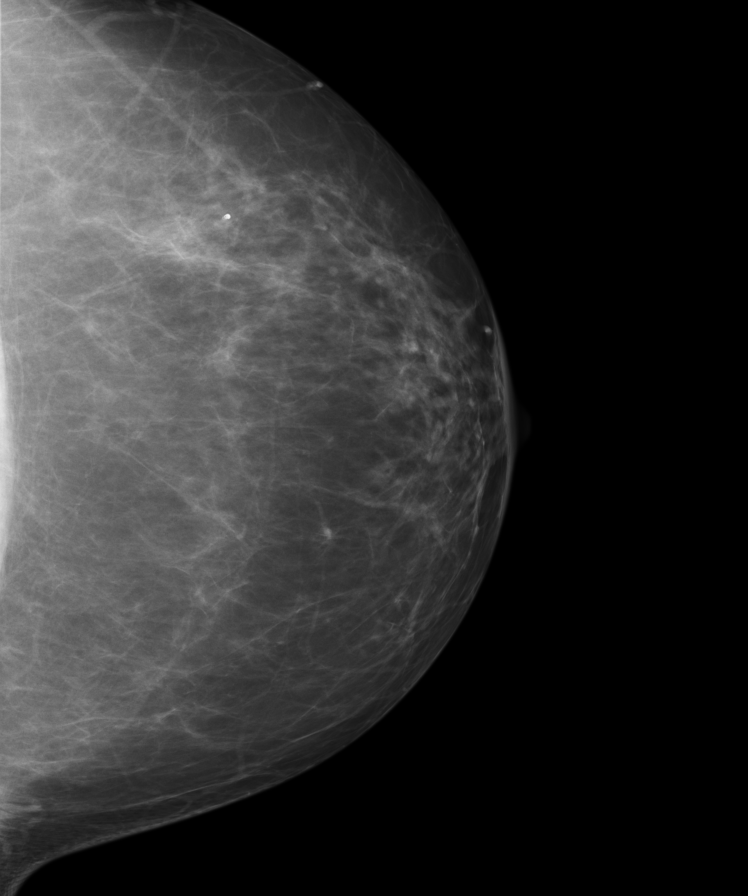
[im 4/8]
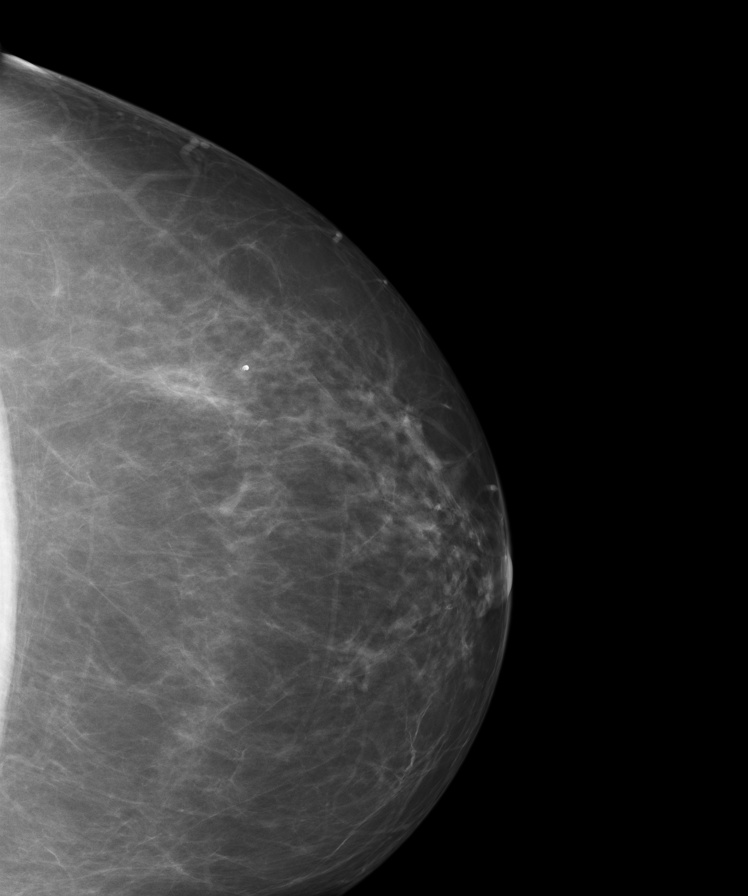
[im 5/8]
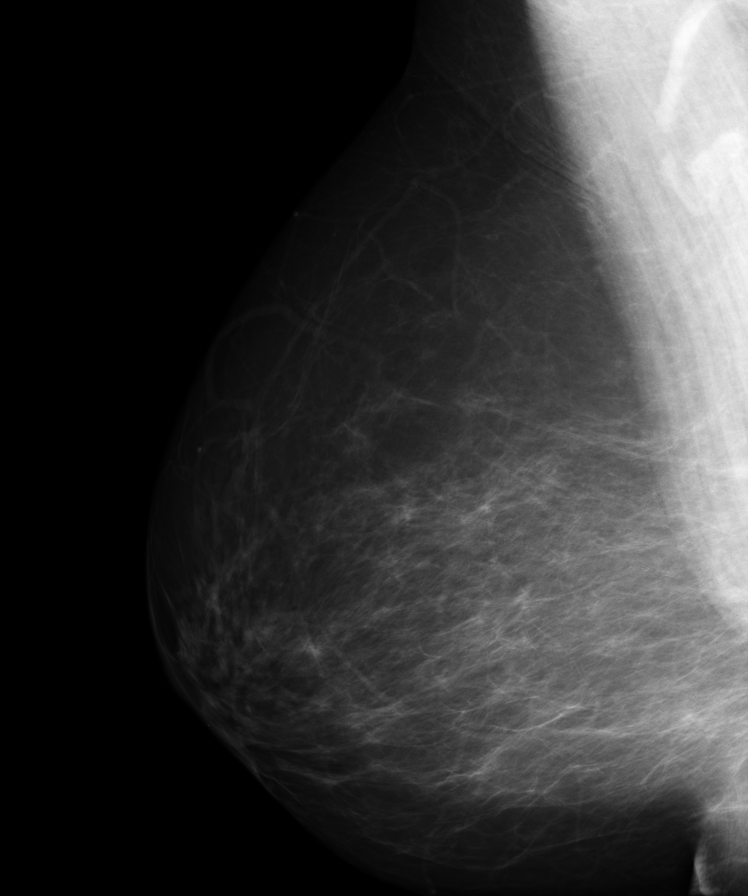
[im 6/8]
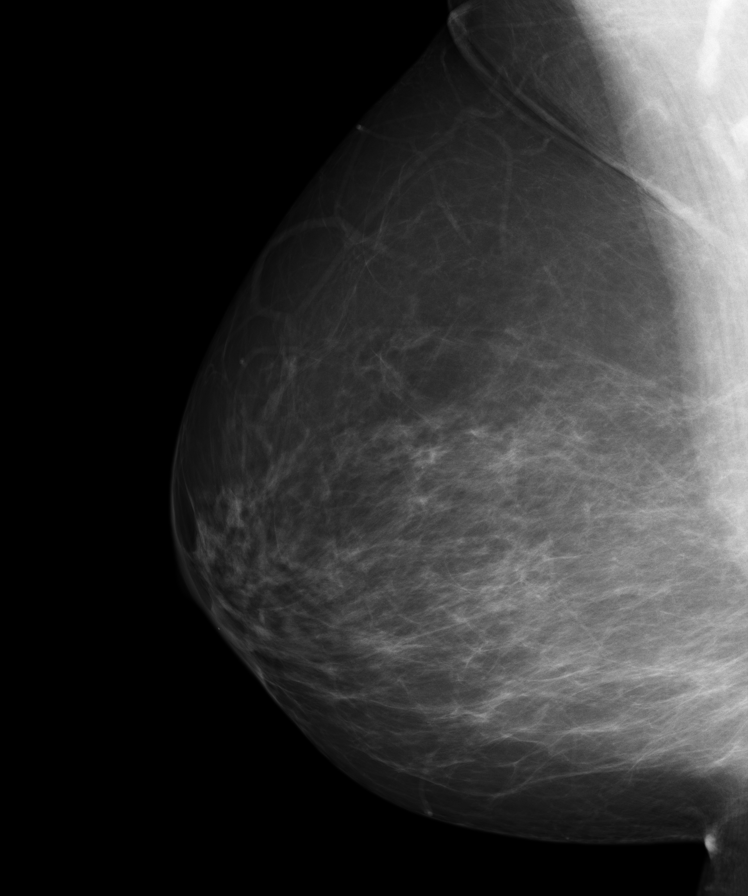
[im 7/8]
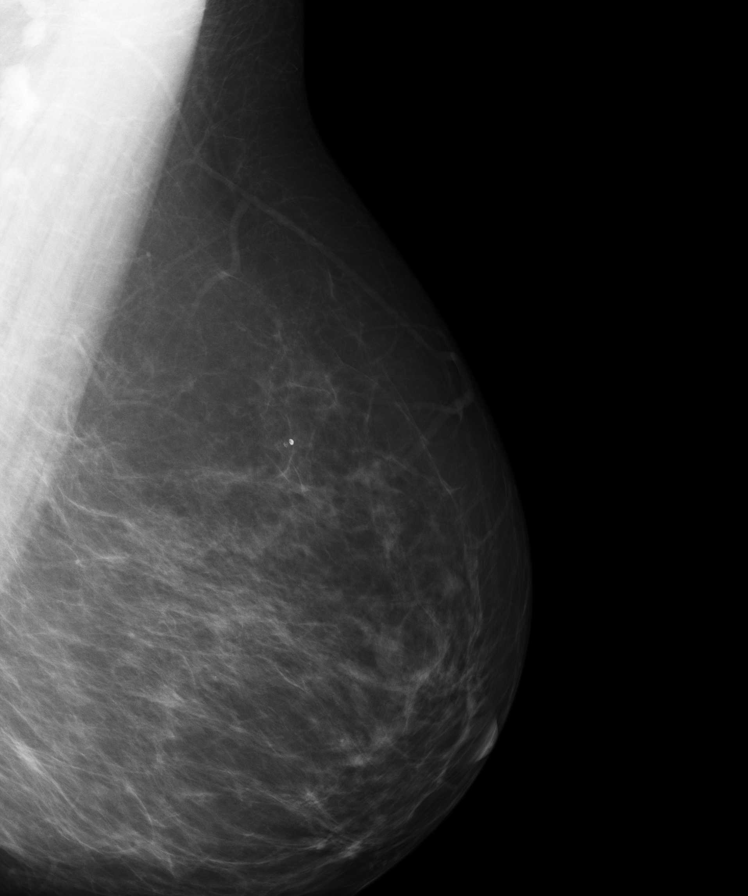
[im 8/8]
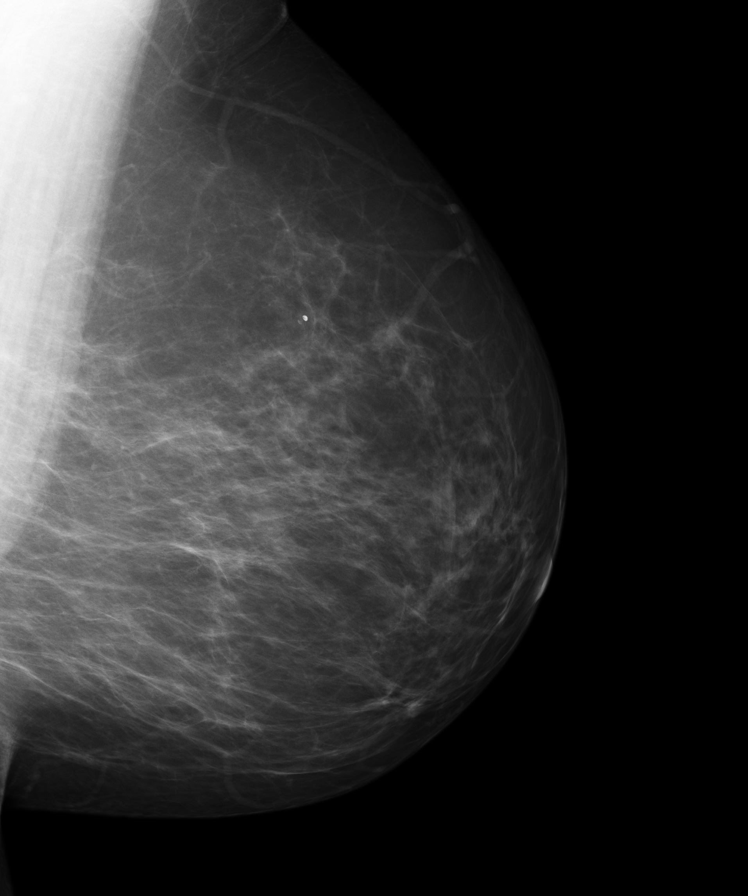

[8 of 8 positions shown; findings below may reference images not displayed]

IMPRESSION: BI-RADS: Category 2 - Benign Findings

A NEGATIVE MAMMOGRAM REPORT DOES NOT PRECLUDE BIOPSY OR OTHER EVALUATION OF
A CLINICALLY PALPABLE OR OTHERWISE SUSPICIOUS MASS OR LESION. BREAST CANCER
MAY NOT BE DETECTED BY MAMMOGRAPHY IN UP TO 10% OF CASES.

## 2010-04-18 ENCOUNTER — Ambulatory Visit: Payer: Self-pay

## 2010-10-04 ENCOUNTER — Ambulatory Visit: Payer: Self-pay

## 2010-10-04 IMAGING — MG MM CAD SCREENING MAMMO
1 series · 4 of 4 positions shown · non-contrast
Comparison: none

REASON FOR EXAM: SCR
COMMENTS:

[R CC · right · 4 of 4 slices shown]
[im 1/4]
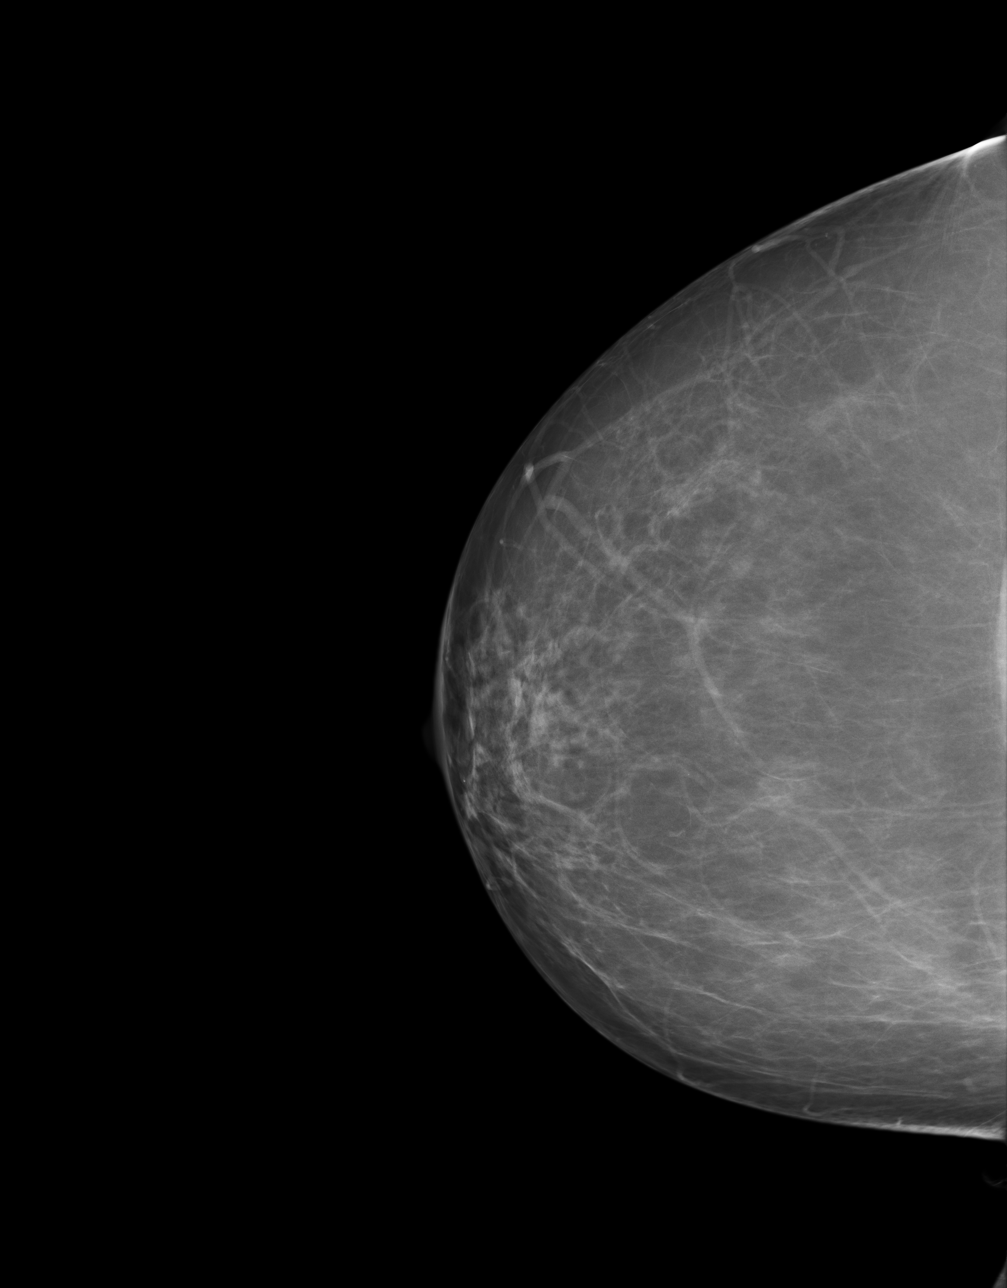
[im 2/4]
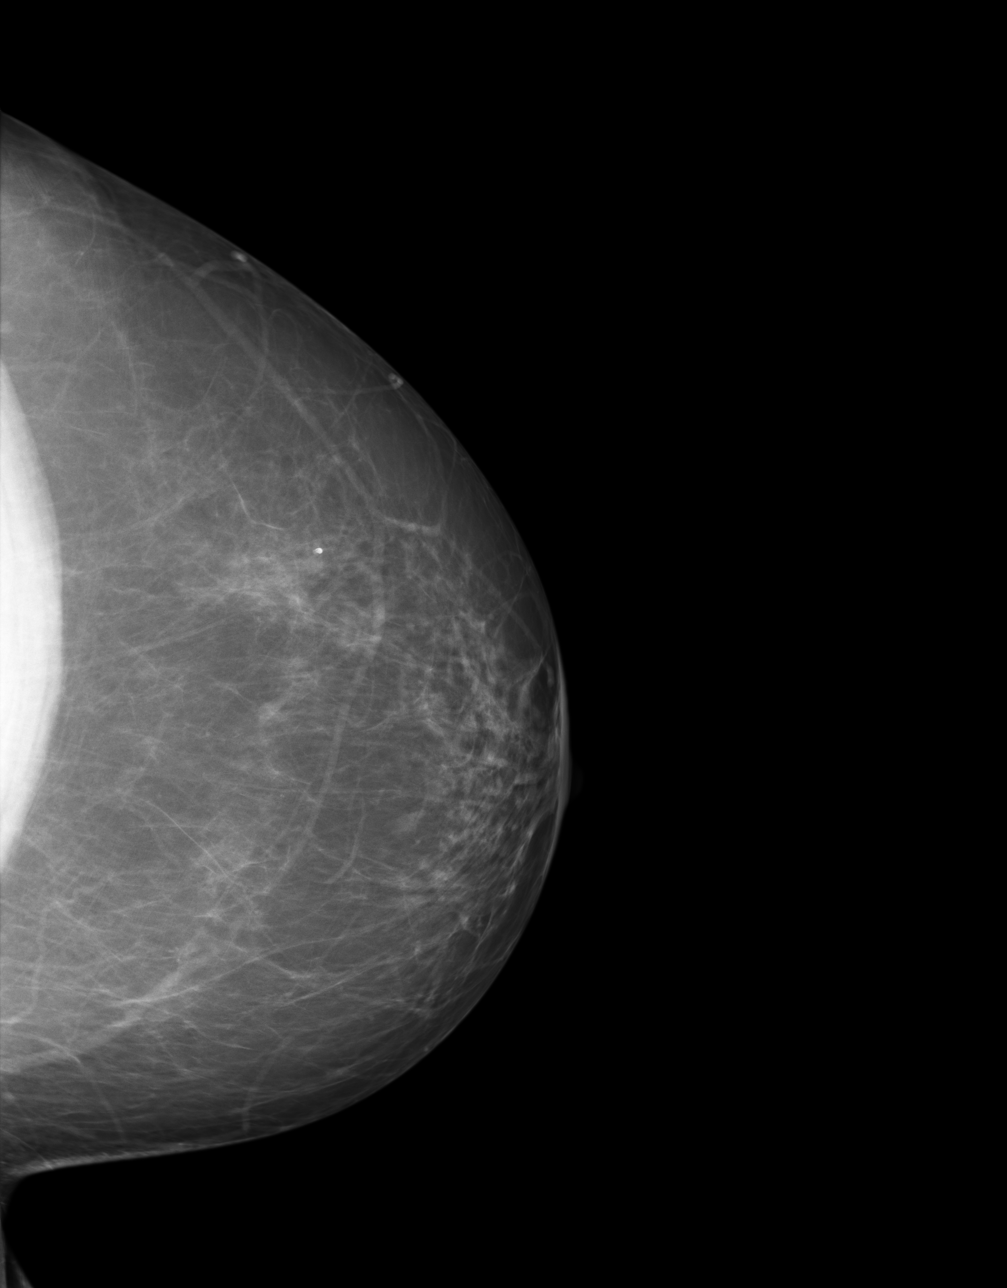
[im 3/4]
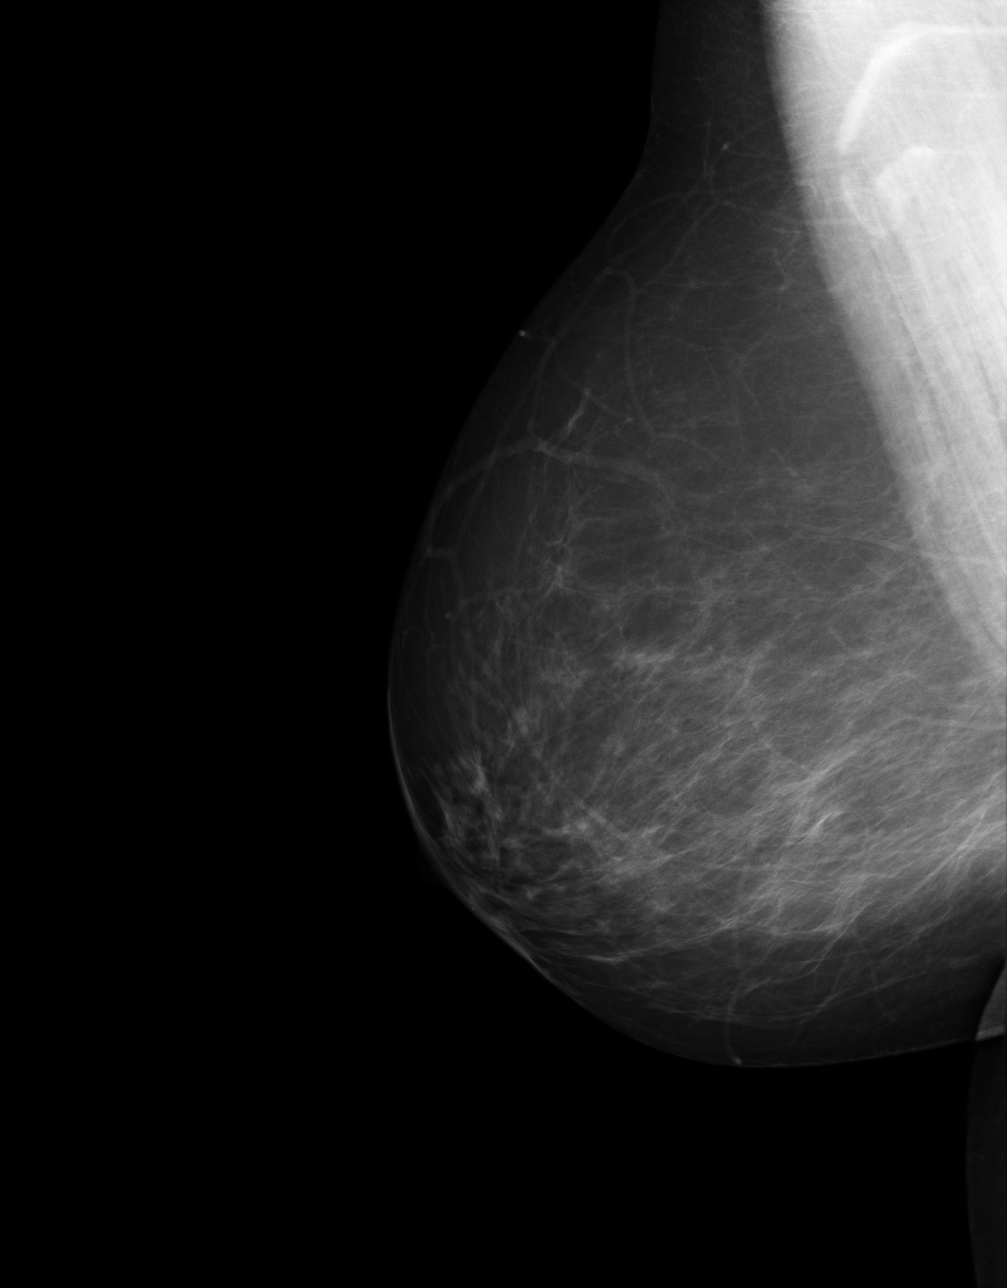
[im 4/4]
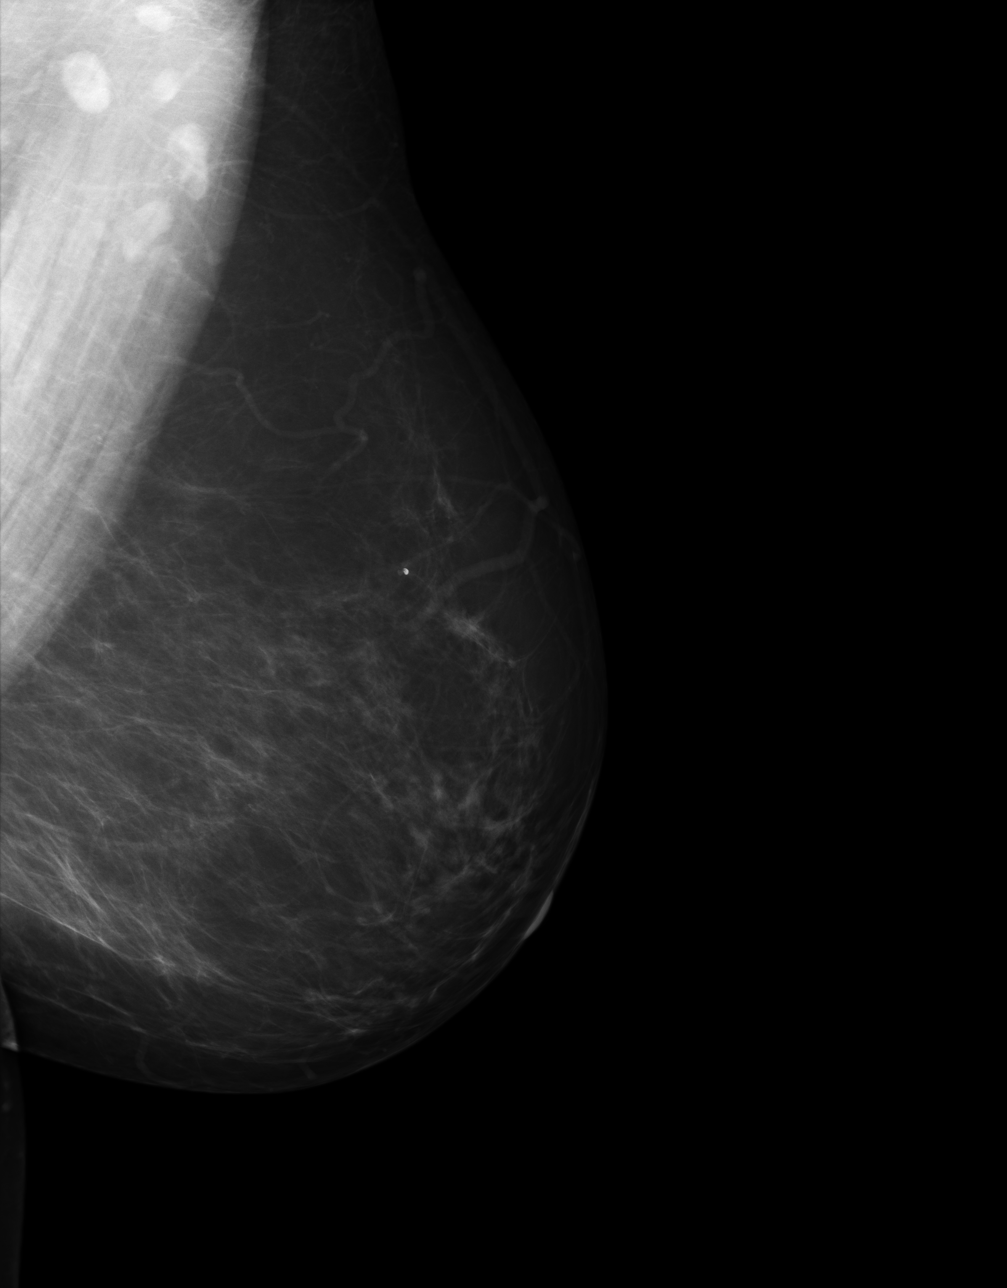

[4 of 4 positions shown; findings below may reference images not displayed]

PROCEDURE:     MAM - MAM DGTL SCREENING MAMMO W/CAD  - [DATE]  [DATE]

RESULT:     Comparison is made to the study of [DATE] and to images of
[DATE] and [DATE]. The breasts exhibit a mildly dense parenchymal
pattern. There is no developing density, dominant mass or
malignant-appearing calcification. The appearance is stable. There is no
known family history of breast cancer.
IMPRESSION: 1.     Stable, benign-appearing bilateral mammogram.
2.     BI-RADS:  Category 2- Benign Finding.
3.     Please continue to encourage annual mammographic followup.

A negative mammogram report does not preclude biopsy or other evaluation of
a clinically palpable or otherwise suspicious mass or lesion.  Breast cancer
may not be detected by mammography in up to 10% of cases.

## 2011-08-02 ENCOUNTER — Encounter: Payer: Self-pay | Admitting: Internal Medicine

## 2011-11-13 ENCOUNTER — Ambulatory Visit: Payer: Self-pay

## 2012-11-15 ENCOUNTER — Ambulatory Visit: Payer: Self-pay

## 2012-11-15 IMAGING — MG MM CAD SCREENING MAMMO
1 series · 4 of 4 positions shown · non-contrast
Comparison: none

REASON FOR EXAM: SCR MAMMO NO ORDER
COMMENTS:

PROCEDURE:     MAM - MAM DGTL SCRN MAM NO ORDER W/CAD  - [DATE] [DATE]
RESULT:

[R CC · right · 4 of 4 slices shown]
[im 1/4]
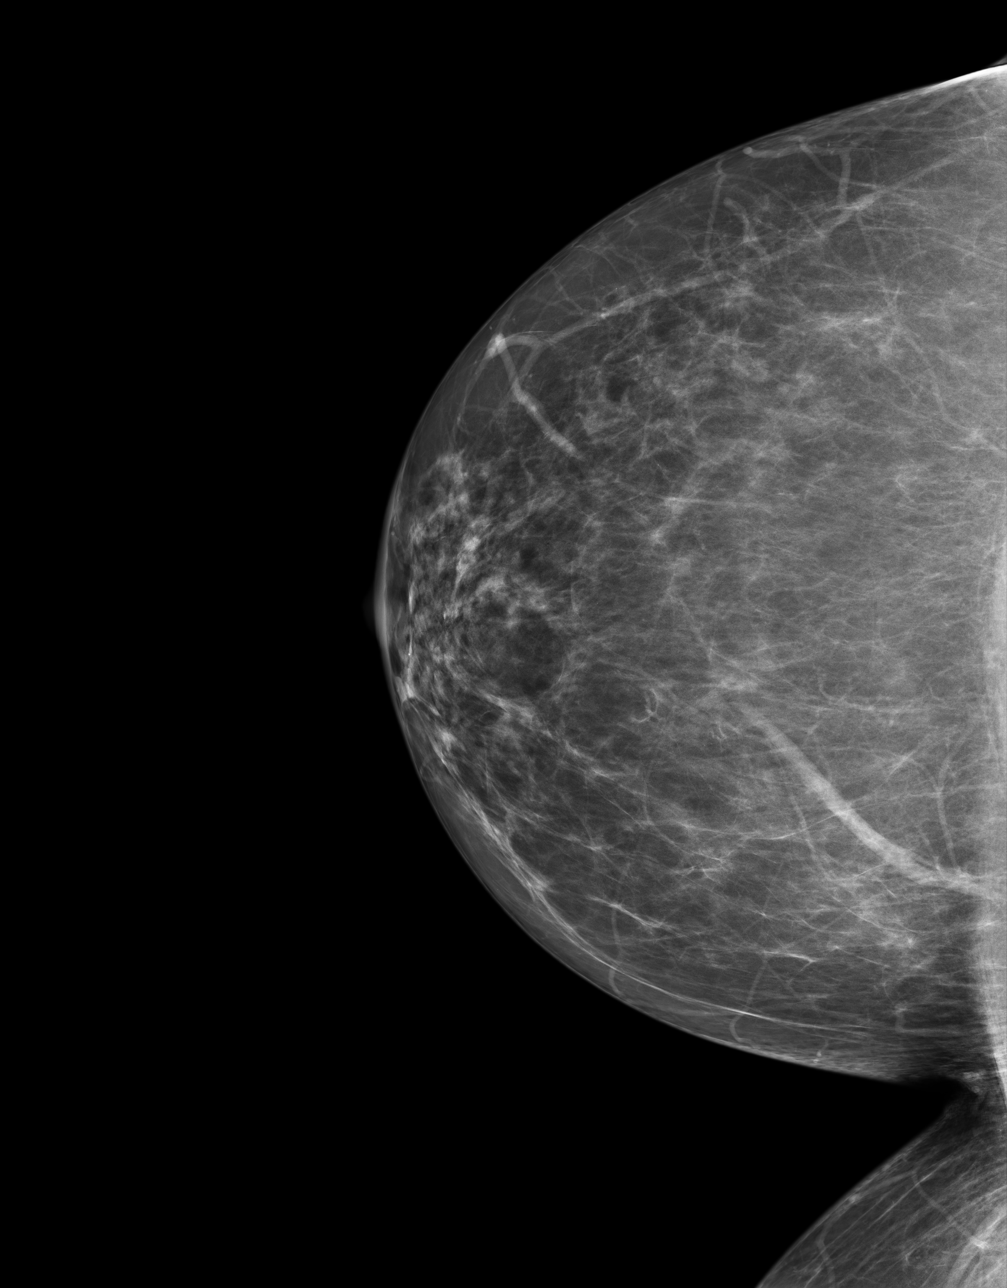
[im 2/4]
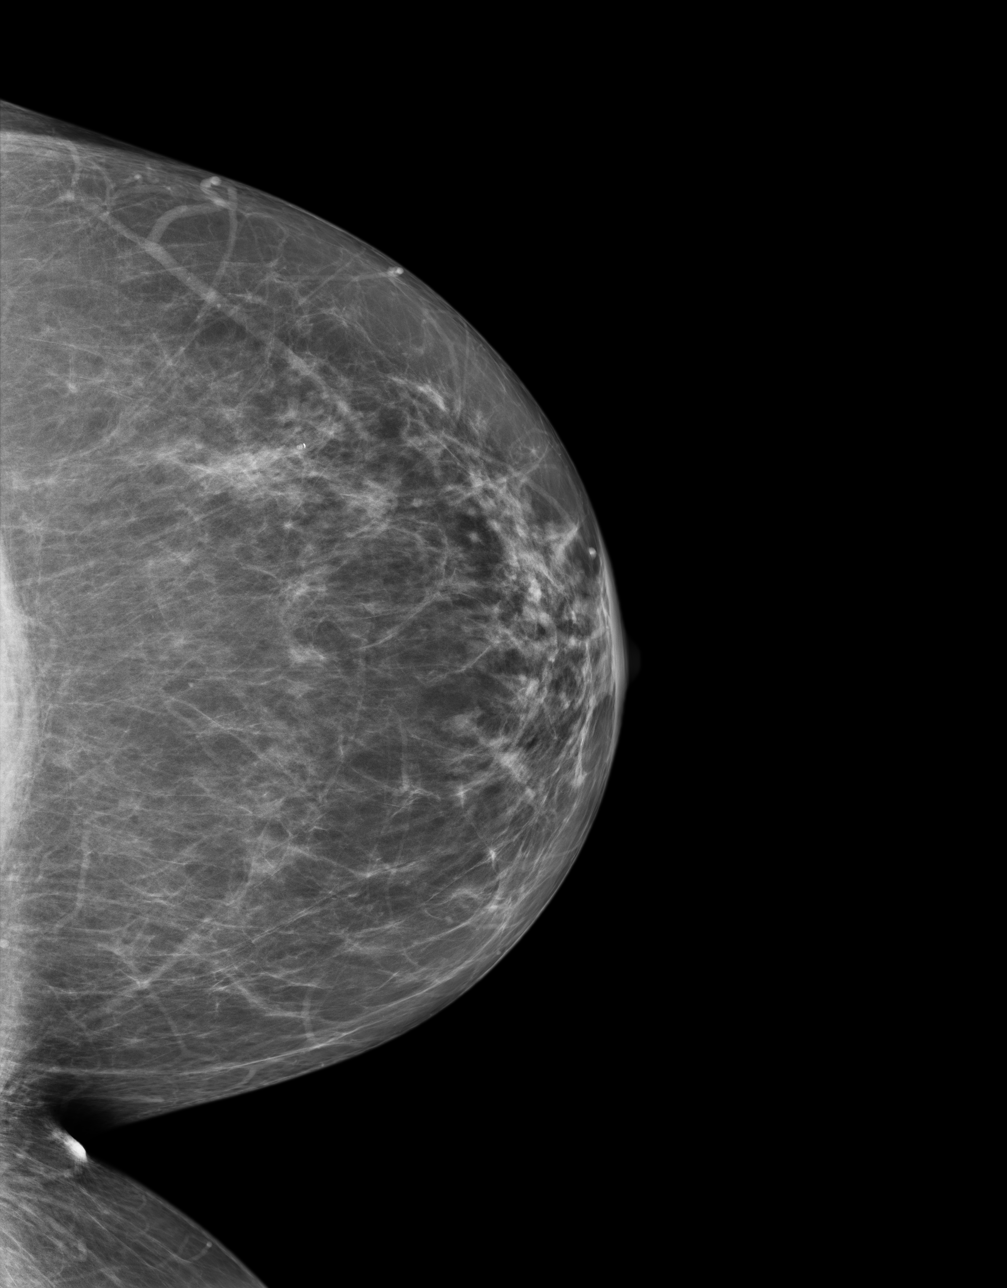
[im 3/4]
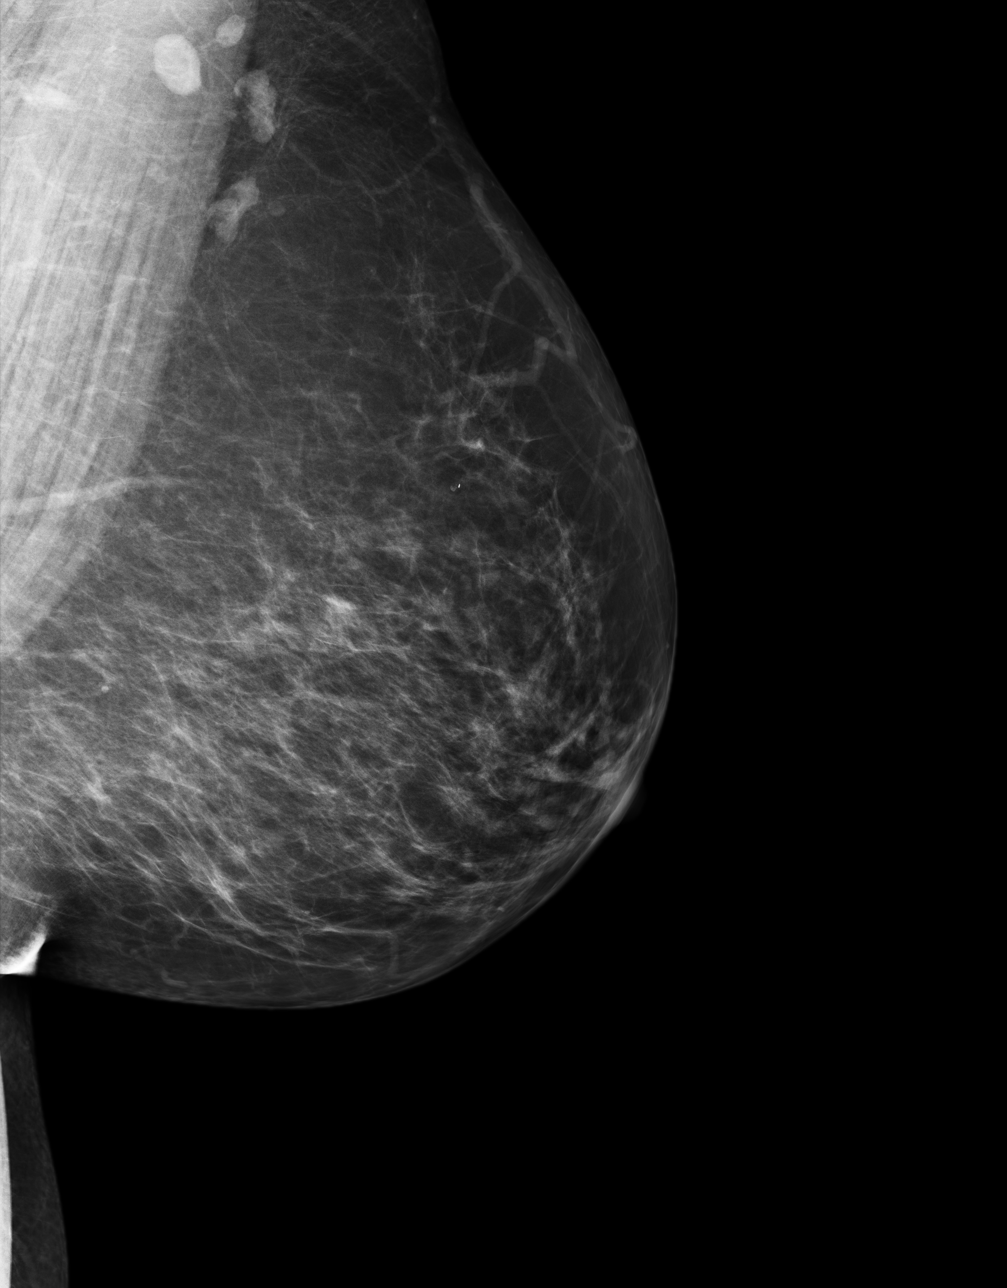
[im 4/4]
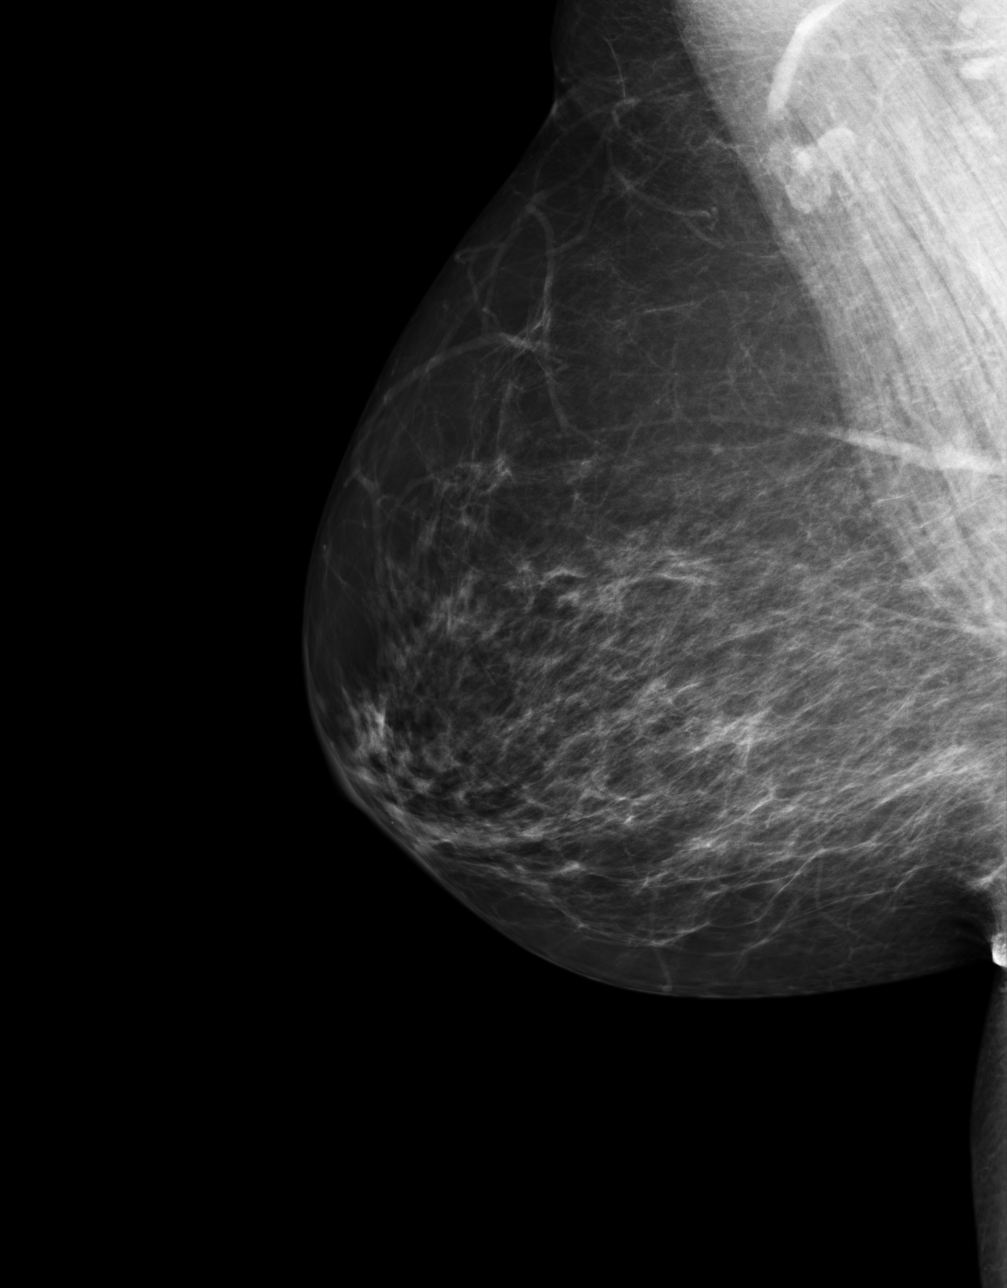

[4 of 4 positions shown; findings below may reference images not displayed]

FINDINGS: There is no known personal or family history of breast cancer. The
patient denies previous breast surgery.

Comparison is made to previous digital mammographic studies including images
from [DATE],[DATE] and [DATE].

There is a mild to moderately dense somewhat nodular heterogeneous
parenchymal pattern without a developing or dominant mass, malignant
appearing calcification or architectural distortion. Benign appearing
calcifications are present in the left breast. Axillary lymph nodes appear
to be unchanged bilaterally.
IMPRESSION: Benign appearing bilateral mammogram.

RECOMMENDATION:  Please continue to encourage annual mammographic follow-up
and monthly breast self exam.

BI-RADS: Category 2 - Benign Findings

Breast density: BI-RADS Type II

[REDACTED]

Thank you for this opportunity to contribute to the care of your patient.

A NEGATIVE MAMMOGRAM REPORT DOES NOT PRECLUDE BIOPSY OR OTHER EVALUATION OF
A CLINICALLY PALPABLE OR OTHERWISE SUSPICIOUS MASS OR LESION. BREAST CANCER
MAY NOT BE DETECTED BY MAMMOGRAPHY IN UP TO 10% OF CASES.

## 2013-06-27 ENCOUNTER — Encounter: Payer: Self-pay | Admitting: Internal Medicine

## 2013-11-07 ENCOUNTER — Ambulatory Visit: Payer: Self-pay

## 2013-11-07 IMAGING — US US CAROTID DUPLEX BILAT
1 series · 13 of 24 positions shown · non-contrast
Comparison: None.

CLINICAL DATA: Left Carotid bruit, history of hyperlipidemia,
history of tobacco use

EXAM:
BILATERAL CAROTID DUPLEX ULTRASOUND
TECHNIQUE: Gray scale imaging, color Doppler and duplex ultrasound were
performed of bilateral carotid and vertebral arteries in the neck.

[Series 1: us carotid duplex bilat · 0.07mm/px · 13 of 65 slices shown]
[im 1/65]
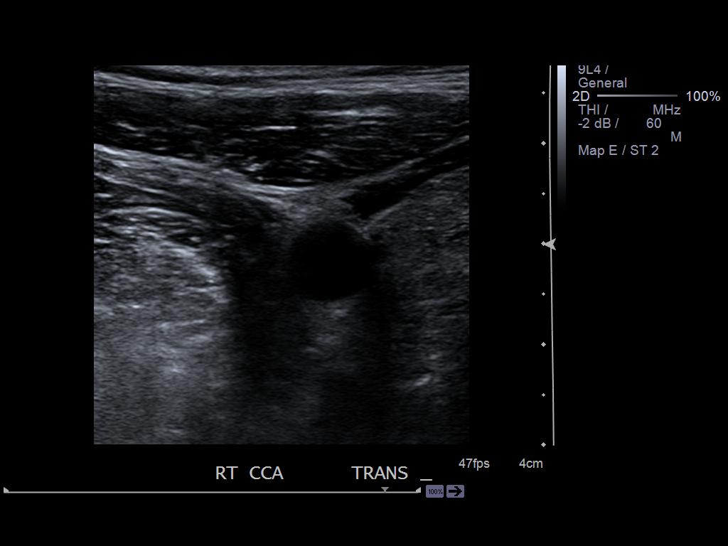
[im 6/65]
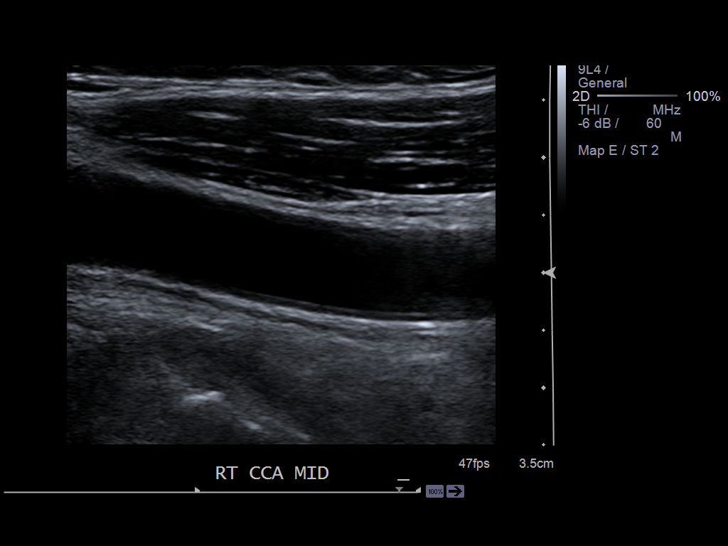
[im 12/65]
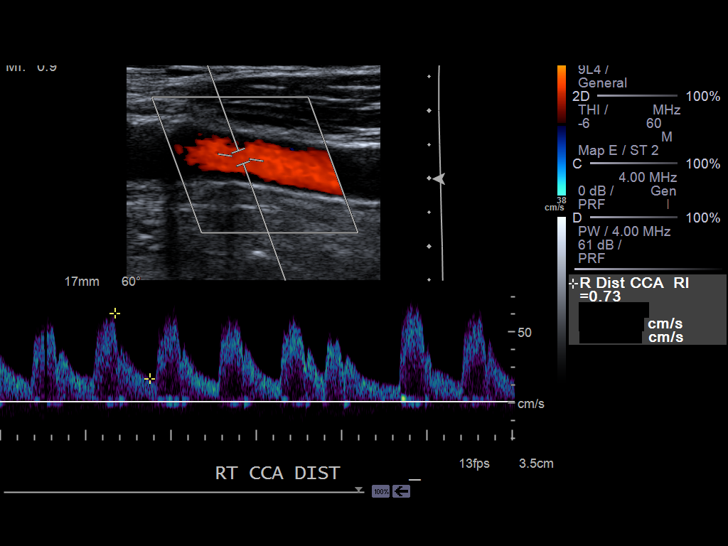
[im 17/65]
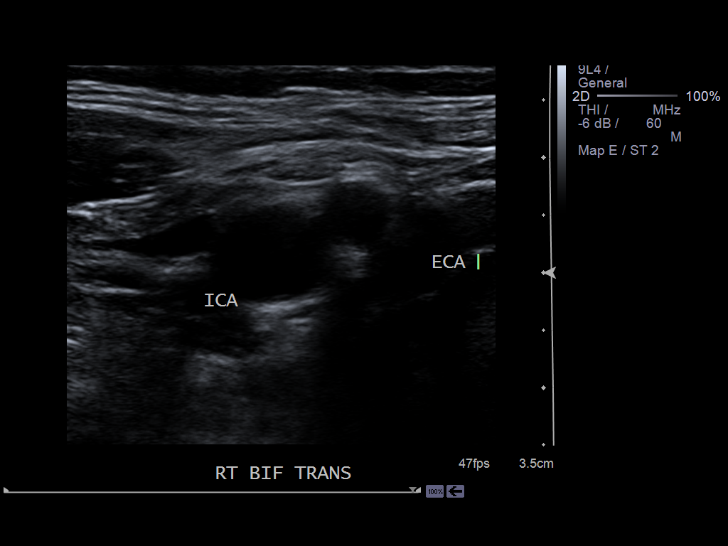
[im 23/65]
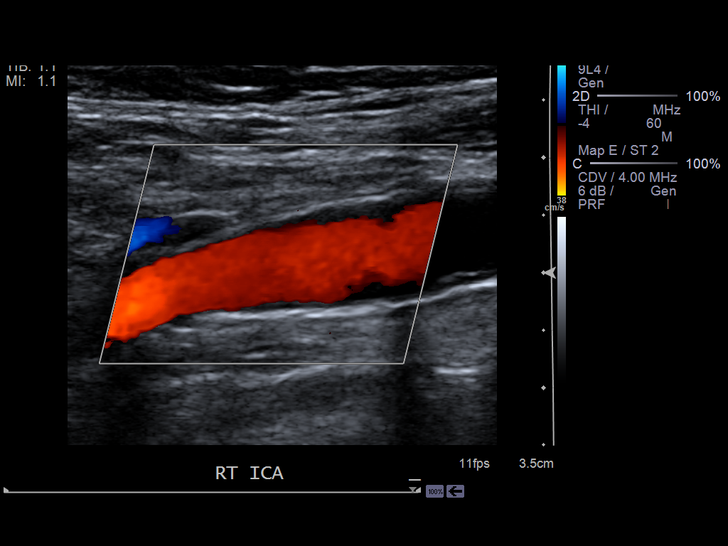
[im 28/65]
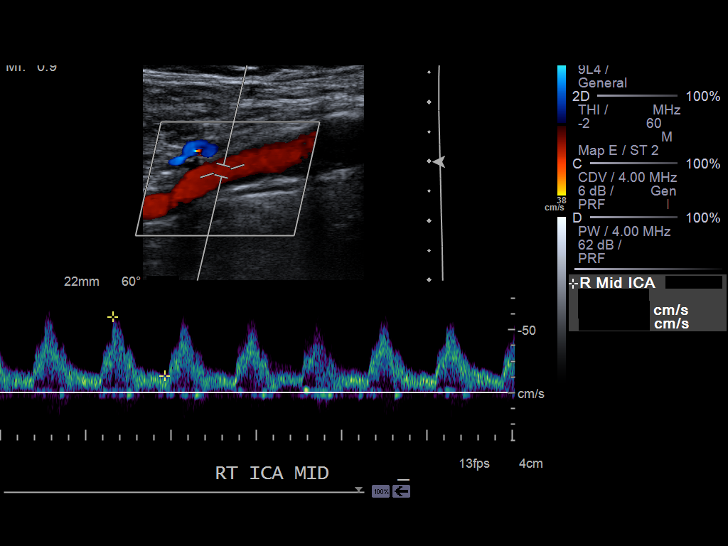
[im 34/65]
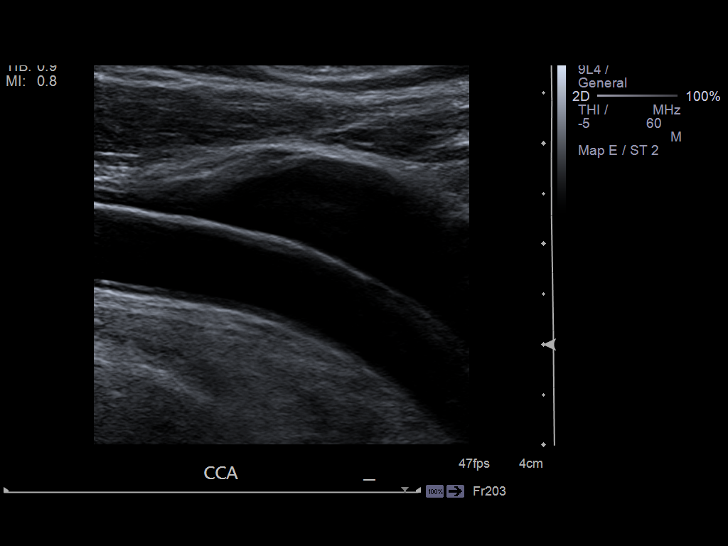
[im 37/65]
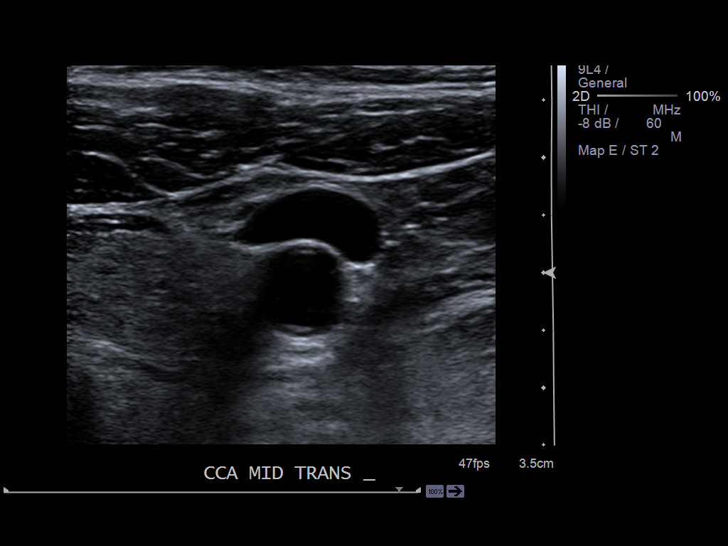
[im 42/65]
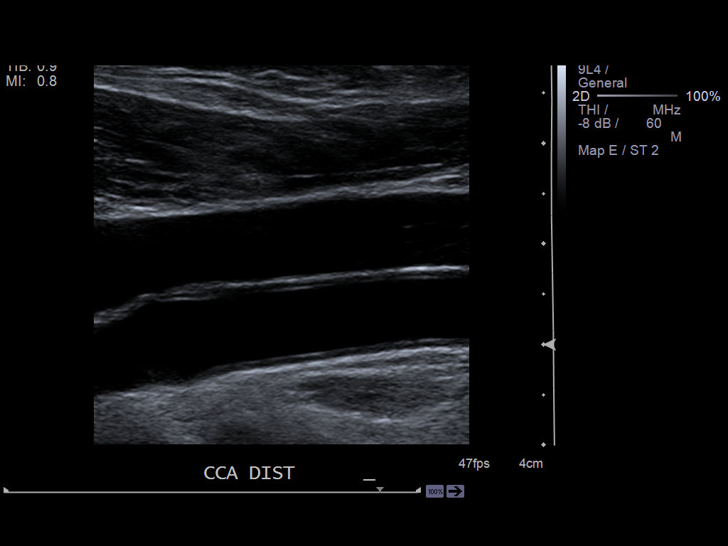
[im 48/65]
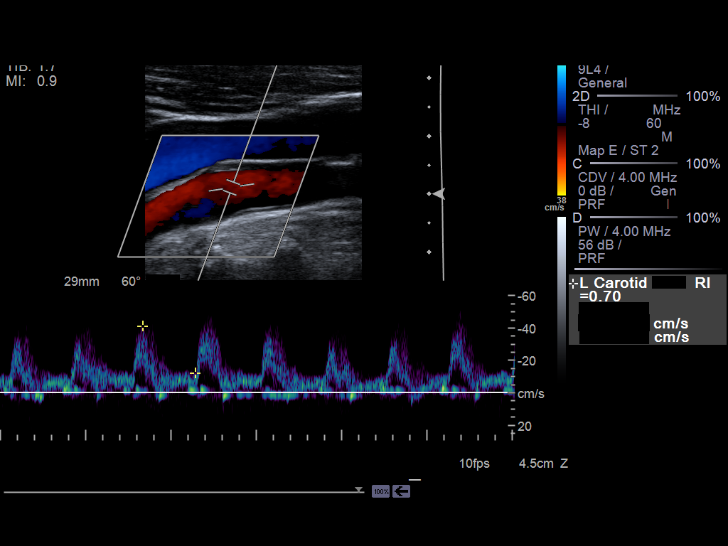
[im 53/65]
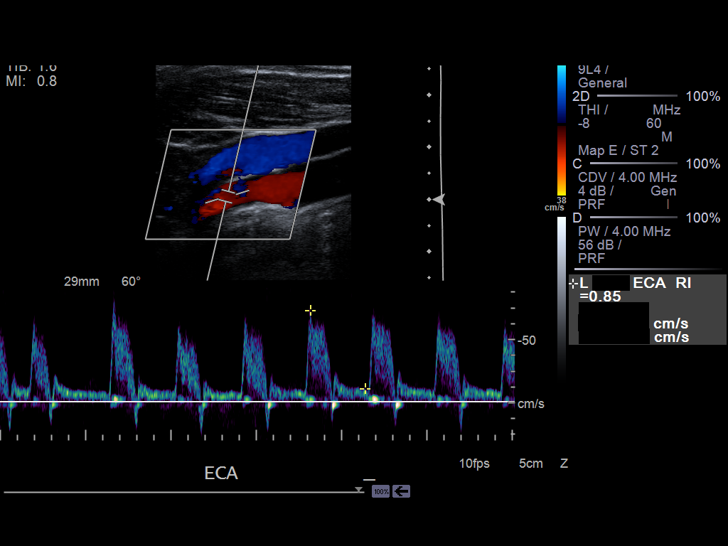
[im 59/65]
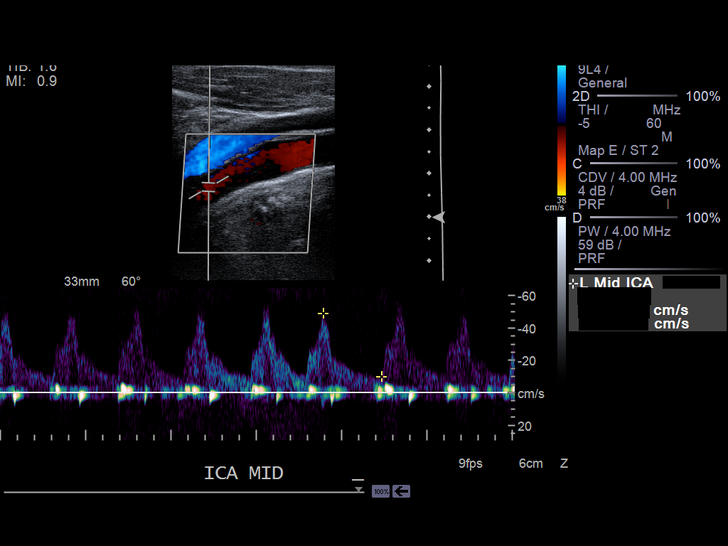
[im 65/65]
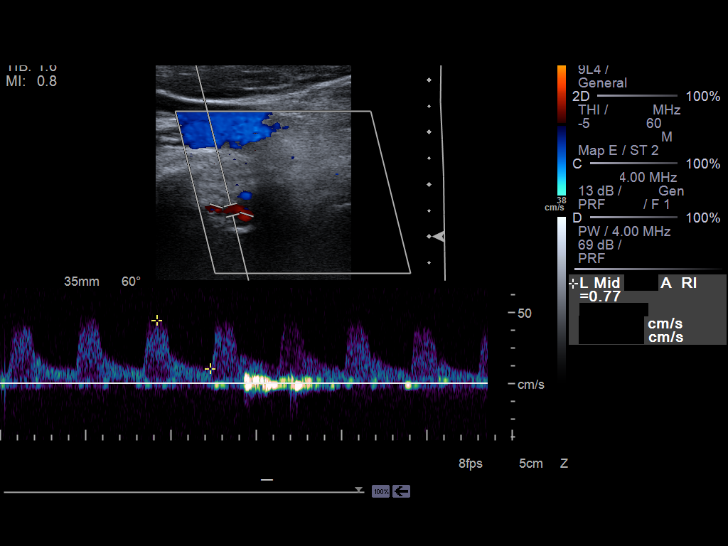

[13 of 24 positions shown; findings below may reference images not displayed]

FINDINGS: Criteria: Quantification of carotid stenosis is based on velocity
parameters that correlate the residual internal carotid diameter
with NASCET-based stenosis levels, using the diameter of the distal
internal carotid lumen as the denominator for stenosis measurement.

The following velocity measurements were obtained:

RIGHT

ICA:  60 cm/sec

CCA:  57 cm/sec

SYSTOLIC ICA/CCA RATIO:

DIASTOLIC ICA/CCA RATIO:

ECA:  75 cm/sec

LEFT

ICA:  49 cm/sec

CCA:  60 cm/sec

SYSTOLIC ICA/CCA RATIO:

DIASTOLIC ICA/CCA RATIO:

ECA:  73 cm/sec

RIGHT CAROTID ARTERY: Small amount of calcified plaque at the right
carotid bulb

RIGHT VERTEBRAL ARTERY:  Normal in flow direction bilaterally.

LEFT CAROTID ARTERY:  No significant soft or calcified plaque.

LEFT VERTEBRAL ARTERY:  Normal in flow direction.
IMPRESSION: There is no evidence of a hemodynamically significant carotid
stenosis.

## 2013-11-20 ENCOUNTER — Ambulatory Visit: Payer: Self-pay

## 2014-05-03 ENCOUNTER — Encounter: Payer: Self-pay | Admitting: Internal Medicine

## 2015-08-27 ENCOUNTER — Encounter: Payer: Self-pay | Admitting: *Deleted

## 2015-09-21 ENCOUNTER — Other Ambulatory Visit: Payer: Self-pay | Admitting: Family Medicine

## 2015-09-21 ENCOUNTER — Encounter: Payer: Self-pay | Admitting: Family Medicine

## 2015-09-21 ENCOUNTER — Ambulatory Visit (INDEPENDENT_AMBULATORY_CARE_PROVIDER_SITE_OTHER): Payer: BC Managed Care – PPO | Admitting: Family Medicine

## 2015-09-21 VITALS — BP 136/70 | HR 84 | Temp 98.0°F | Resp 16 | Ht 67.0 in | Wt 196.0 lb

## 2015-09-21 DIAGNOSIS — Z0000 Encounter for general adult medical examination without abnormal findings: Secondary | ICD-10-CM

## 2015-09-21 DIAGNOSIS — E669 Obesity, unspecified: Secondary | ICD-10-CM | POA: Diagnosis not present

## 2015-09-21 DIAGNOSIS — I10 Essential (primary) hypertension: Secondary | ICD-10-CM | POA: Diagnosis not present

## 2015-09-21 DIAGNOSIS — Z124 Encounter for screening for malignant neoplasm of cervix: Secondary | ICD-10-CM | POA: Diagnosis not present

## 2015-09-21 DIAGNOSIS — Z1321 Encounter for screening for nutritional disorder: Secondary | ICD-10-CM

## 2015-09-21 DIAGNOSIS — Z1239 Encounter for other screening for malignant neoplasm of breast: Secondary | ICD-10-CM | POA: Diagnosis not present

## 2015-09-21 LAB — COMPREHENSIVE METABOLIC PANEL
ALBUMIN: 4.2 g/dL (ref 3.6–5.1)
ALK PHOS: 54 U/L (ref 33–130)
ALT: 15 U/L (ref 6–29)
AST: 16 U/L (ref 10–35)
BILIRUBIN TOTAL: 0.5 mg/dL (ref 0.2–1.2)
BUN: 19 mg/dL (ref 7–25)
CALCIUM: 9.2 mg/dL (ref 8.6–10.4)
CO2: 22 mmol/L (ref 20–31)
Chloride: 105 mmol/L (ref 98–110)
Creat: 0.98 mg/dL (ref 0.50–0.99)
GLUCOSE: 113 mg/dL — AB (ref 70–99)
Potassium: 4.7 mmol/L (ref 3.5–5.3)
Sodium: 138 mmol/L (ref 135–146)
TOTAL PROTEIN: 7.1 g/dL (ref 6.1–8.1)

## 2015-09-21 LAB — CBC WITH DIFFERENTIAL/PLATELET
BASOS ABS: 0 10*3/uL (ref 0.0–0.1)
Basophils Relative: 0 % (ref 0–1)
Eosinophils Absolute: 0.1 10*3/uL (ref 0.0–0.7)
Eosinophils Relative: 1 % (ref 0–5)
HEMATOCRIT: 43.9 % (ref 36.0–46.0)
HEMOGLOBIN: 14.6 g/dL (ref 12.0–15.0)
LYMPHS ABS: 3.5 10*3/uL (ref 0.7–4.0)
LYMPHS PCT: 37 % (ref 12–46)
MCH: 30 pg (ref 26.0–34.0)
MCHC: 33.3 g/dL (ref 30.0–36.0)
MCV: 90.3 fL (ref 78.0–100.0)
MPV: 9.9 fL (ref 8.6–12.4)
Monocytes Absolute: 0.7 10*3/uL (ref 0.1–1.0)
Monocytes Relative: 7 % (ref 3–12)
NEUTROS ABS: 5.2 10*3/uL (ref 1.7–7.7)
Neutrophils Relative %: 55 % (ref 43–77)
Platelets: 305 10*3/uL (ref 150–400)
RBC: 4.86 MIL/uL (ref 3.87–5.11)
RDW: 14.1 % (ref 11.5–15.5)
WBC: 9.5 10*3/uL (ref 4.0–10.5)

## 2015-09-21 LAB — LIPID PANEL
CHOLESTEROL: 210 mg/dL — AB (ref 125–200)
HDL: 36 mg/dL — AB (ref >=46)
LDL Cholesterol: 123 mg/dL (ref <130)
TRIGLYCERIDES: 254 mg/dL — AB (ref <150)
Total CHOL/HDL Ratio: 5.8 Ratio — ABNORMAL HIGH (ref <=5.0)
VLDL: 51 mg/dL — ABNORMAL HIGH (ref <30)

## 2015-09-21 MED ORDER — HYDROCHLOROTHIAZIDE 25 MG PO TABS: 25.0000 mg | ORAL_TABLET | Freq: Every day | ORAL | Status: DC

## 2015-09-21 NOTE — Assessment & Plan Note (Signed)
Well controlled, continue HCTZ Discussed healthy eating , exercise

## 2015-09-21 NOTE — Progress Notes (Addendum)
Patient ID: Tracy James, female   DOB: September 29, 1951, 64 y.o.   MRN: [id number]   Subjective:    Patient ID: Tracy James, female    DOB: [birth year], 64 y.o.   MRN: [id number]  Patient presents for CPE with PAP  patient here to establish care. Her previous PCP was Dr. Hardin Negus. She was retired Geophysicist/field seismologist at [employer]. She is history of hypertension has been well controlled on hydrochlorothiazide. She tries to limit her medication and uses homeopathic remedies. She has no new concerns today. Her colonoscopy is up-to-date and was last done 2 years ago she has a family history of colon cancer. She is due for mammogram in November. She's been getting Pap smears almost yearly she had one abnormal Pap smear 4 years ago she had cryotherapy done and they have been normal since then.   - On exam heart murmur seen- has been evaluated by previous PCP  She declines immunizations.  Family history and social history reviewed. She is separated but has 2 children.    Review Of Systems:  GEN- denies fatigue, fever, weight loss,weakness, recent illness HEENT- denies eye drainage, change in vision, nasal discharge, CVS- denies chest pain, palpitations RESP- denies SOB, cough, wheeze ABD- denies N/V, change in stools, abd pain GU- denies dysuria, hematuria, dribbling, incontinence MSK- denies joint pain, muscle aches, injury Neuro- denies headache, dizziness, syncope, seizure activity       Objective:    BP 136/70 mmHg  Pulse 84  Temp(Src) 98 F (36.7 C) (Oral)  Resp 16  Ht 5\' 7"  (1.702 m)  Wt 196 lb (88.905 kg)  BMI 30.69 kg/m2 GEN- NAD, alert and oriented x3 HEENT- PERRL, EOMI, non injected sclera, pink conjunctiva, MMM, oropharynx clear Neck- Supple, no thyromegaly Breast- normal symmetry, no nipple inversion,no nipple drainage, no nodules or lumps felt Nodes- no axillary nodes CVS- RRR, 2/6 SEM RESP-CTAB ABD-NABS,soft,NT,ND GU- normal external genitalia,  vaginal mucosa pink and moist, cervix visualized no growth, no blood form os, minimal thin clear discharge, no CMT, no ovarian masses, uterus normal size, rectum, norma EXT- No edema Pulses- Radial, DP- 2+        Assessment & Plan:      Problem List Items Addressed This Visit    Obesity   Essential hypertension, benign    Well controlled, continue HCTZ Discussed healthy eating , exercise      Relevant Medications   hydrochlorothiazide (HYDRODIURIL) 25 MG tablet    Other Visit Diagnoses    Routine general medical examination at a health care facility    -  Primary    CPE done, schedule mammogram, obtain records from PCP, declines flu shots, fasting labs done, PAP Smear if normal repeat in 3 years    Relevant Orders    CBC with Differential/Platelet    Comprehensive metabolic panel    Lipid panel    Cervical cancer screening        Relevant Orders    PAP, ThinPrep ASCUS Rflx HPV Rflx Type    Breast cancer screening        Relevant Orders    MM DIGITAL SCREENING BILATERAL    Encounter for vitamin deficiency screening        Relevant Orders    Vitamin D, 25-hydroxy       Note: This dictation was prepared with Dragon dictation along with smaller phrase technology. Any transcriptional errors that result from this process are unintentional.

## 2015-09-21 NOTE — Patient Instructions (Addendum)
Release of records- [facility]- last Mammogram Release of records- Dr. Hardin Negus  Schedule your mammogram F/U 1 year for PHYSICAL

## 2015-09-22 LAB — PAP THINPREP ASCUS RFLX HPV RFLX TYPE

## 2015-09-22 LAB — VITAMIN D 25 HYDROXY (VIT D DEFICIENCY, FRACTURES): VIT D 25 HYDROXY: 28 ng/mL — AB (ref 30–100)

## 2015-09-22 LAB — HEMOGLOBIN A1C
HEMOGLOBIN A1C: 6.3 % — AB (ref <5.7)
Mean Plasma Glucose: 134 mg/dL — ABNORMAL HIGH (ref <117)

## 2015-09-27 ENCOUNTER — Encounter: Payer: Self-pay | Admitting: *Deleted

## 2015-10-19 ENCOUNTER — Encounter: Payer: Self-pay | Admitting: *Deleted

## 2015-11-12 ENCOUNTER — Ambulatory Visit
Admission: RE | Admit: 2015-11-12 | Discharge: 2015-11-12 | Disposition: A | Discharge status: Home / Self Care | Payer: BC Managed Care – PPO | Source: Ambulatory Visit | Attending: Family Medicine | Admitting: Family Medicine

## 2015-11-12 DIAGNOSIS — Z1239 Encounter for other screening for malignant neoplasm of breast: Secondary | ICD-10-CM

## 2016-03-20 ENCOUNTER — Encounter: Payer: Self-pay | Admitting: Family Medicine

## 2016-03-20 ENCOUNTER — Ambulatory Visit (INDEPENDENT_AMBULATORY_CARE_PROVIDER_SITE_OTHER): Payer: BC Managed Care – PPO | Admitting: Family Medicine

## 2016-03-20 VITALS — BP 140/78 | HR 72 | Temp 97.8°F | Resp 16 | Ht 67.0 in | Wt 189.0 lb

## 2016-03-20 DIAGNOSIS — R7303 Prediabetes: Secondary | ICD-10-CM | POA: Diagnosis not present

## 2016-03-20 DIAGNOSIS — E781 Pure hyperglyceridemia: Secondary | ICD-10-CM | POA: Insufficient documentation

## 2016-03-20 DIAGNOSIS — E669 Obesity, unspecified: Secondary | ICD-10-CM

## 2016-03-20 DIAGNOSIS — I10 Essential (primary) hypertension: Secondary | ICD-10-CM | POA: Diagnosis not present

## 2016-03-20 LAB — CBC WITH DIFFERENTIAL/PLATELET
BASOS PCT: 0 %
Basophils Absolute: 0 cells/uL (ref 0–200)
EOS ABS: 158 {cells}/uL (ref 15–500)
Eosinophils Relative: 2 %
HEMATOCRIT: 40.4 % (ref 35.0–45.0)
Hemoglobin: 13.1 g/dL (ref 12.0–15.0)
LYMPHS PCT: 41 %
Lymphs Abs: 3239 cells/uL (ref 850–3900)
MCH: 29.8 pg (ref 27.0–33.0)
MCHC: 32.4 g/dL (ref 32.0–36.0)
MCV: 92 fL (ref 80.0–100.0)
MONO ABS: 553 {cells}/uL (ref 200–950)
MONOS PCT: 7 %
MPV: 10 fL (ref 7.5–12.5)
NEUTROS PCT: 50 %
Neutro Abs: 3950 cells/uL (ref 1500–7800)
PLATELETS: 293 10*3/uL (ref 140–400)
RBC: 4.39 MIL/uL (ref 3.80–5.10)
RDW: 14.2 % (ref 11.0–15.0)
WBC: 7.9 10*3/uL (ref 3.8–10.8)

## 2016-03-20 LAB — LIPID PANEL
Cholesterol: 175 mg/dL (ref 125–200)
HDL: 36 mg/dL — AB (ref >=46)
LDL CALC: 96 mg/dL (ref <130)
Total CHOL/HDL Ratio: 4.9 Ratio (ref <=5.0)
Triglycerides: 214 mg/dL — ABNORMAL HIGH (ref <150)
VLDL: 43 mg/dL — ABNORMAL HIGH (ref <30)

## 2016-03-20 LAB — COMPREHENSIVE METABOLIC PANEL
ALT: 18 U/L (ref 6–29)
AST: 19 U/L (ref 10–35)
Albumin: 3.8 g/dL (ref 3.6–5.1)
Alkaline Phosphatase: 46 U/L (ref 33–130)
BILIRUBIN TOTAL: 0.5 mg/dL (ref 0.2–1.2)
BUN: 17 mg/dL (ref 7–25)
CHLORIDE: 104 mmol/L (ref 98–110)
CO2: 22 mmol/L (ref 20–31)
CREATININE: 0.99 mg/dL (ref 0.50–0.99)
Calcium: 9.1 mg/dL (ref 8.6–10.4)
GLUCOSE: 111 mg/dL — AB (ref 70–99)
Potassium: 4.2 mmol/L (ref 3.5–5.3)
SODIUM: 137 mmol/L (ref 135–146)
Total Protein: 6.4 g/dL (ref 6.1–8.1)

## 2016-03-20 LAB — HEMOGLOBIN A1C
HEMOGLOBIN A1C: 6.3 % — AB (ref <5.7)
MEAN PLASMA GLUCOSE: 134 mg/dL

## 2016-03-20 NOTE — Patient Instructions (Signed)
Release of records- [facility] We will call with labs results F/U 6 months for Physical

## 2016-03-20 NOTE — Assessment & Plan Note (Signed)
Recheck TG, working on dietary changes, also pre-diabetic with A1C at 6.3%

## 2016-03-20 NOTE — Assessment & Plan Note (Signed)
No meds taken this AM, BP typically controlled with HCTZ No change to medication

## 2016-03-20 NOTE — Progress Notes (Signed)
Patient ID: Tracy James, female   DOB: [birth year], 65 y.o.   MRN: [id number]    Subjective:    Patient ID: Tracy James, female    DOB: 1951-11-11, 65 y.o.   MRN: [id number]  Patient presents for 6 month F/U Patient here to follow-up. She has no particular concerns today. She's been being very active she's been changed her diet her weight is down 7 pounds intentionally. She is due for repeat fasting labs she has hypertriglyceridemia with her triglycerides in the 250s her HDL was also low. She is still taking her blood pressure medication as prescribed in her supplements including omega-3 and calcium and vitamin D. Her last set of labs were reviewed at bedside.    Review Of Systems:  GEN- denies fatigue, fever, weight loss,weakness, recent illness HEENT- denies eye drainage, change in vision, nasal discharge, CVS- denies chest pain, palpitations RESP- denies SOB, cough, wheeze ABD- denies N/V, change in stools, abd pain GU- denies dysuria, hematuria, dribbling, incontinence MSK- denies joint pain, muscle aches, injury Neuro- denies headache, dizziness, syncope, seizure activity       Objective:    BP 140/78 mmHg  Pulse 72  Temp(Src) 97.8 F (36.6 C) (Oral)  Resp 16  Ht 5\' 7"  (1.702 m)  Wt 189 lb (85.73 kg)  BMI 29.59 kg/m2 GEN- NAD, alert and oriented x3 HEENT- PERRL, EOMI, non injected sclera, pink conjunctiva, MMM, oropharynx clear Neck- Supple, no thyromegaly CVS- RRR, 2/6 SEM RESP-CTAB EXT- No edema Pulses- Radial, DP- 2+        Assessment & Plan:      Problem List Items Addressed This Visit    Obesity   Hypertriglyceridemia    Recheck TG, working on dietary changes, also pre-diabetic with A1C at 6.3%       Relevant Orders   Lipid panel   Essential hypertension, benign - Primary    No meds taken this AM, BP typically controlled with HCTZ No change to medication      Relevant Orders   Comprehensive metabolic panel   CBC with  Differential/Platelet    Other Visit Diagnoses    Borderline diabetes        Relevant Orders    Hemoglobin A1c       Note: This dictation was prepared with Dragon dictation along with smaller phrase technology. Any transcriptional errors that result from this process are unintentional.

## 2016-07-13 ENCOUNTER — Encounter: Payer: Self-pay | Admitting: Family Medicine

## 2016-09-22 ENCOUNTER — Other Ambulatory Visit: Payer: Self-pay | Admitting: Family Medicine

## 2016-09-22 ENCOUNTER — Encounter: Payer: Self-pay | Admitting: Family Medicine

## 2016-09-22 ENCOUNTER — Ambulatory Visit (INDEPENDENT_AMBULATORY_CARE_PROVIDER_SITE_OTHER): Payer: BC Managed Care – PPO | Admitting: Family Medicine

## 2016-09-22 VITALS — BP 136/68 | HR 76 | Temp 98.8°F | Resp 14 | Ht 67.0 in | Wt 185.0 lb

## 2016-09-22 DIAGNOSIS — I10 Essential (primary) hypertension: Secondary | ICD-10-CM

## 2016-09-22 DIAGNOSIS — E559 Vitamin D deficiency, unspecified: Secondary | ICD-10-CM

## 2016-09-22 DIAGNOSIS — Z1211 Encounter for screening for malignant neoplasm of colon: Secondary | ICD-10-CM

## 2016-09-22 DIAGNOSIS — Z1231 Encounter for screening mammogram for malignant neoplasm of breast: Secondary | ICD-10-CM | POA: Diagnosis not present

## 2016-09-22 DIAGNOSIS — R7303 Prediabetes: Secondary | ICD-10-CM | POA: Diagnosis not present

## 2016-09-22 DIAGNOSIS — R011 Cardiac murmur, unspecified: Secondary | ICD-10-CM

## 2016-09-22 DIAGNOSIS — Z0000 Encounter for general adult medical examination without abnormal findings: Secondary | ICD-10-CM

## 2016-09-22 DIAGNOSIS — Z1239 Encounter for other screening for malignant neoplasm of breast: Secondary | ICD-10-CM

## 2016-09-22 DIAGNOSIS — Z13820 Encounter for screening for osteoporosis: Secondary | ICD-10-CM

## 2016-09-22 DIAGNOSIS — E781 Pure hyperglyceridemia: Secondary | ICD-10-CM

## 2016-09-22 DIAGNOSIS — Z1159 Encounter for screening for other viral diseases: Secondary | ICD-10-CM

## 2016-09-22 DIAGNOSIS — Z780 Asymptomatic menopausal state: Secondary | ICD-10-CM

## 2016-09-22 DIAGNOSIS — E663 Overweight: Secondary | ICD-10-CM | POA: Diagnosis not present

## 2016-09-22 DIAGNOSIS — I350 Nonrheumatic aortic (valve) stenosis: Secondary | ICD-10-CM | POA: Insufficient documentation

## 2016-09-22 LAB — CBC WITH DIFFERENTIAL/PLATELET
Basophils Absolute: 0 {cells}/uL (ref 0–200)
Basophils Relative: 0 %
Eosinophils Absolute: 107 {cells}/uL (ref 15–500)
Eosinophils Relative: 1 %
HCT: 43.8 % (ref 35.0–45.0)
Hemoglobin: 14.6 g/dL (ref 12.0–15.0)
Lymphocytes Relative: 37 %
Lymphs Abs: 3959 {cells}/uL — ABNORMAL HIGH (ref 850–3900)
MCH: 30.8 pg (ref 27.0–33.0)
MCHC: 33.3 g/dL (ref 32.0–36.0)
MCV: 92.4 fL (ref 80.0–100.0)
MPV: 10.3 fL (ref 7.5–12.5)
Monocytes Absolute: 749 {cells}/uL (ref 200–950)
Monocytes Relative: 7 %
Neutro Abs: 5885 {cells}/uL (ref 1500–7800)
Neutrophils Relative %: 55 %
Platelets: 311 K/uL (ref 140–400)
RBC: 4.74 MIL/uL (ref 3.80–5.10)
RDW: 13.8 % (ref 11.0–15.0)
WBC: 10.7 K/uL (ref 3.8–10.8)

## 2016-09-22 LAB — COMPREHENSIVE METABOLIC PANEL WITH GFR
ALT: 16 U/L (ref 6–29)
AST: 19 U/L (ref 10–35)
Albumin: 4.2 g/dL (ref 3.6–5.1)
Alkaline Phosphatase: 50 U/L (ref 33–130)
BUN: 23 mg/dL (ref 7–25)
CO2: 26 mmol/L (ref 20–31)
Calcium: 9.7 mg/dL (ref 8.6–10.4)
Chloride: 103 mmol/L (ref 98–110)
Creat: 1.19 mg/dL — ABNORMAL HIGH (ref 0.50–0.99)
Glucose, Bld: 103 mg/dL — ABNORMAL HIGH (ref 70–99)
Potassium: 4.3 mmol/L (ref 3.5–5.3)
Sodium: 139 mmol/L (ref 135–146)
Total Bilirubin: 0.4 mg/dL (ref 0.2–1.2)
Total Protein: 7.2 g/dL (ref 6.1–8.1)

## 2016-09-22 LAB — LIPID PANEL
Cholesterol: 191 mg/dL (ref 125–200)
HDL: 38 mg/dL — ABNORMAL LOW
LDL Cholesterol: 101 mg/dL
Total CHOL/HDL Ratio: 5 ratio
Triglycerides: 262 mg/dL — ABNORMAL HIGH
VLDL: 52 mg/dL — ABNORMAL HIGH

## 2016-09-22 LAB — HEMOGLOBIN A1C
Hgb A1c MFr Bld: 5.8 % — ABNORMAL HIGH
Mean Plasma Glucose: 120 mg/dL

## 2016-09-22 LAB — TSH: TSH: 1.42 m[IU]/L

## 2016-09-22 MED ORDER — CALCIUM 600-200 MG-UNIT PO TABS: 2.0000 | ORAL_TABLET | Freq: Every day | ORAL | Status: DC

## 2016-09-22 NOTE — Progress Notes (Signed)
   Subjective:    Patient ID: Tracy James, female    DOB: [birth year], 65 y.o.   MRN: [id number]  Patient presents for CPE (is fasting)  Pt here for CPE   Mammogram- due in Dec  PAP Smear- UTD, normal past 5 years, had cryotherapy in past   Declines immunizations Colonoscopy Due- history of polyps  Taking BP meds as prescribed, she does have borderline DM last A1C 6.3% 6 months ago as well as high triglycerdies  No concerns  Discussed hep C screen  She has  intentionally lost weight by doing water aerobics and change in her diet  Review Of Systems:  GEN- denies fatigue, fever, weight loss,weakness, recent illness HEENT- denies eye drainage, change in vision, nasal discharge, CVS- denies chest pain, palpitations RESP- denies SOB, cough, wheeze ABD- denies N/V, change in stools, abd pain GU- denies dysuria, hematuria, dribbling, incontinence MSK- denies joint pain, muscle aches, injury Neuro- denies headache, dizziness, syncope, seizure activity       Objective:    BP 136/68 (BP Location: Left Arm, Patient Position: Sitting, Cuff Size: Normal)   Pulse 76   Temp 98.8 F (37.1 C) (Oral)   Resp 14   Ht 5\' 7"  (1.702 m)   Wt 185 lb (83.9 kg)   BMI 28.98 kg/m  GEN- NAD, alert and oriented x3 HEENT- PERRL, EOMI, non injected sclera, pink conjunctiva, MMM, oropharynx clear Neck- Supple, no thyromegaly CVS- RRR, 3/6 SEM radiates to carotids  RESP-CTAB ABD-NABS,soft,NT,ND EXT- No edema Pulses- Radial, DP- 2+        Assessment & Plan:      Problem List Items Addressed This Visit    Overweight (BMI 25.0-29.9)   Hypertriglyceridemia   Relevant Orders   Lipid panel   Heart murmur   Essential hypertension, benign    Well controlled       Borderline diabetic    Check A1C      Relevant Orders   Hemoglobin A1c    Other Visit Diagnoses    Routine general medical examination at a health care facility    -  Primary   CPE done, PAP UTD, Colonoscopy to be scheduled,  congratuled on weight, no longer in obese category, declines immunizations   Relevant Orders   CBC with Differential/Platelet   Comprehensive metabolic panel   TSH   Colon cancer screening       Relevant Orders   Ambulatory referral to Gastroenterology   Breast cancer screening       Relevant Orders   MS DIGITAL SCREENING TOMO BILATERAL   Osteoporosis screening       Relevant Orders   DG Bone Density   Need for hepatitis C screening test       Relevant Orders   Hepatitis C antibody      Note: This dictation was prepared with Dragon dictation along with smaller phrase technology. Any transcriptional errors that result from this process are unintentional.

## 2016-09-22 NOTE — Assessment & Plan Note (Signed)
Check A1C 

## 2016-09-22 NOTE — Patient Instructions (Addendum)
Referral for GI for colonoscopy Schedule your mammogram and Bone Density  F/U 1 year for Physical

## 2016-09-22 NOTE — Assessment & Plan Note (Signed)
Well controlled 

## 2016-09-23 LAB — HEPATITIS C ANTIBODY: HCV Ab: NEGATIVE

## 2016-09-27 ENCOUNTER — Other Ambulatory Visit: Payer: Self-pay | Admitting: Family Medicine

## 2016-09-29 ENCOUNTER — Other Ambulatory Visit: Payer: Self-pay | Admitting: *Deleted

## 2016-10-02 ENCOUNTER — Encounter: Payer: Self-pay | Admitting: *Deleted

## 2016-10-06 ENCOUNTER — Encounter: Payer: Self-pay | Admitting: Gastroenterology

## 2016-10-19 ENCOUNTER — Telehealth: Payer: Self-pay | Admitting: *Deleted

## 2016-10-19 NOTE — Telephone Encounter (Signed)
Received call from patient.   States that she has some questions about her colonoscopy.   Please call her at [phone].

## 2016-10-23 NOTE — Telephone Encounter (Signed)
Spoke to patient.  She has had a colonoscopy in [place] after 2007.  Had at an outpt facility.  Told pt she will need to come and sign another record release for [place] to get that.  She said she will do at next appt.

## 2016-10-23 NOTE — Telephone Encounter (Signed)
LMTCB  ? Need previous records

## 2016-11-13 ENCOUNTER — Ambulatory Visit
Admission: RE | Admit: 2016-11-13 | Discharge: 2016-11-13 | Disposition: A | Discharge status: Home / Self Care | Payer: BC Managed Care – PPO | Source: Ambulatory Visit | Attending: Family Medicine | Admitting: Family Medicine

## 2016-11-13 DIAGNOSIS — E559 Vitamin D deficiency, unspecified: Secondary | ICD-10-CM

## 2016-11-13 DIAGNOSIS — Z780 Asymptomatic menopausal state: Secondary | ICD-10-CM

## 2016-11-13 DIAGNOSIS — Z1231 Encounter for screening mammogram for malignant neoplasm of breast: Secondary | ICD-10-CM

## 2016-11-14 ENCOUNTER — Encounter: Payer: Self-pay | Admitting: *Deleted

## 2017-01-01 ENCOUNTER — Encounter: Payer: BC Managed Care – PPO | Admitting: Gastroenterology

## 2017-02-16 ENCOUNTER — Ambulatory Visit (INDEPENDENT_AMBULATORY_CARE_PROVIDER_SITE_OTHER): Payer: BC Managed Care – PPO | Admitting: Family Medicine

## 2017-02-16 ENCOUNTER — Encounter: Payer: Self-pay | Admitting: Family Medicine

## 2017-02-16 VITALS — BP 146/80 | HR 84 | Temp 97.9°F | Resp 16 | Ht 67.0 in | Wt 184.0 lb

## 2017-02-16 DIAGNOSIS — B9689 Other specified bacterial agents as the cause of diseases classified elsewhere: Secondary | ICD-10-CM

## 2017-02-16 DIAGNOSIS — J019 Acute sinusitis, unspecified: Secondary | ICD-10-CM | POA: Diagnosis not present

## 2017-02-16 MED ORDER — AMOXICILLIN 875 MG PO TABS: 875.0000 mg | ORAL_TABLET | Freq: Two times a day (BID) | ORAL | 0 refills | Status: DC

## 2017-02-16 NOTE — Progress Notes (Signed)
   Subjective:    Patient ID: Tracy James, female    DOB: 11-14-1951, 66 y.o.   MRN: [id number]  HPI  The patient developed an upper respiratory infection approximately 3 weeks ago characterized by a nonproductive cough and rhinorrhea. She's been battling this for more than 3 weeks. Starting this week she developed pain and pressure in her frontal and maxillary sinuses bilaterally. She's been trying nasal saline Flonase and over-the-counter cold medication with no relief. She now reports a constant sinus headache, postnasal drip, and generally feeling poorly Past Medical History:  Diagnosis Date  . Allergy    SEASONAL  . Heart murmur   . Hypertension    No past surgical history on file. Current Outpatient Prescriptions on File Prior to Visit  Medication Sig Dispense Refill  . Calcium 600-200 MG-UNIT tablet Take 2 tablets by mouth daily.    . hydrochlorothiazide (HYDRODIURIL) 25 MG tablet TAKE ONE TABLET BY MOUTH ONCE DAILY 30 tablet 11  . Omega-3 1000 MG CAPS Take by mouth.    . Turmeric 500 MG TABS Take by mouth.     No current facility-administered medications on file prior to visit.    No Known Allergies Social History   Social History  . Marital status: Legally Separated    Spouse name: N/A  . Number of children: N/A  . Years of education: N/A   Occupational History  . Not on file.   Social History Main Topics  . Smoking status: Former Smoker    Quit date: 12/05/2011  . Smokeless tobacco: Never Used  . Alcohol use No     Comment: occasionally  . Drug use: No  . Sexual activity: Not Currently    Birth control/ protection: None   Other Topics Concern  . Not on file   Social History Narrative  . No narrative on file     Review of Systems  All other systems reviewed and are negative.      Objective:   Physical Exam  HENT:  Right Ear: Tympanic membrane, external ear and ear canal normal.  Left Ear: Tympanic membrane, external ear and ear canal normal.    Nose: Mucosal edema present. Right sinus exhibits maxillary sinus tenderness and frontal sinus tenderness. Left sinus exhibits maxillary sinus tenderness and frontal sinus tenderness.  Mouth/Throat: Oropharynx is clear and moist. No oropharyngeal exudate.  Neck: Neck supple.  Cardiovascular: Normal rate, regular rhythm and normal heart sounds.   Pulmonary/Chest: Effort normal and breath sounds normal. No respiratory distress. She has no wheezes. She has no rales.  Lymphadenopathy:    She has no cervical adenopathy.  Vitals reviewed.         Assessment & Plan:  Acute bacterial rhinosinusitis - Plan: amoxicillin (AMOXIL) 875 MG tablet  I believe the patient's upper respiratory infection has developed into a secondary sinusitis. Begin amoxicillin 875 mg by mouth twice a day for 10 days. Continue nasal saline 2-3 times a day as needed

## 2017-04-02 ENCOUNTER — Ambulatory Visit (INDEPENDENT_AMBULATORY_CARE_PROVIDER_SITE_OTHER): Payer: BC Managed Care – PPO | Admitting: Family Medicine

## 2017-04-02 ENCOUNTER — Encounter: Payer: Self-pay | Admitting: Family Medicine

## 2017-04-02 VITALS — BP 140/78 | HR 80 | Temp 98.6°F | Resp 14 | Ht 67.0 in | Wt 186.0 lb

## 2017-04-02 DIAGNOSIS — S39012A Strain of muscle, fascia and tendon of lower back, initial encounter: Secondary | ICD-10-CM | POA: Diagnosis not present

## 2017-04-02 DIAGNOSIS — M549 Dorsalgia, unspecified: Secondary | ICD-10-CM

## 2017-04-02 DIAGNOSIS — M5430 Sciatica, unspecified side: Secondary | ICD-10-CM | POA: Diagnosis not present

## 2017-04-02 MED ORDER — CYCLOBENZAPRINE HCL 10 MG PO TABS: 10.0000 mg | ORAL_TABLET | Freq: Three times a day (TID) | ORAL | 0 refills | Status: DC | PRN

## 2017-04-02 MED ORDER — NAPROXEN 500 MG PO TABS: 500.0000 mg | ORAL_TABLET | Freq: Two times a day (BID) | ORAL | 0 refills | Status: DC

## 2017-04-02 NOTE — Patient Instructions (Signed)
Use muscle relaxer Use anti-inflammation  Okay for Well's CHiropracter F/U 4 weeks if not better
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
## 2017-04-04 ENCOUNTER — Telehealth: Payer: Self-pay | Admitting: *Deleted

## 2017-04-04 DIAGNOSIS — S39012A Strain of muscle, fascia and tendon of lower back, initial encounter: Secondary | ICD-10-CM

## 2017-04-04 NOTE — Telephone Encounter (Signed)
Attempted to call patient.  No answer.

## 2017-04-04 NOTE — Telephone Encounter (Signed)
Received VM from patient.   States that she would like to be referred to [facility] for lumbar strain, but she requires referral.   Ok to order?

## 2017-04-04 NOTE — Telephone Encounter (Signed)
She stated chiropracter, does she want to cancel that ? Otherwise she can see Sports Medicine or is she referring to Physical therapy?

## 2017-04-04 NOTE — Telephone Encounter (Signed)
Patient returned call. Requested call on cell phone [phone].  States that she would like to be seen for PT. Does not want to continue with Chiropractor.   Would like to see: [facility] Address: [address] [place], [place],  [address] [phone]  MD made aware and approved order.   Referral placed.

## 2017-04-18 ENCOUNTER — Encounter: Payer: Self-pay | Admitting: Physical Therapy

## 2017-04-18 ENCOUNTER — Ambulatory Visit: Payer: BC Managed Care – PPO | Attending: Family Medicine | Admitting: Physical Therapy

## 2017-04-18 DIAGNOSIS — M5441 Lumbago with sciatica, right side: Secondary | ICD-10-CM | POA: Diagnosis present

## 2017-04-18 DIAGNOSIS — R2689 Other abnormalities of gait and mobility: Secondary | ICD-10-CM | POA: Insufficient documentation
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
## 2017-04-20 ENCOUNTER — Other Ambulatory Visit: Payer: Self-pay | Admitting: Family Medicine

## 2017-04-20 NOTE — Telephone Encounter (Signed)
okay

## 2017-04-20 NOTE — Telephone Encounter (Signed)
Medication called to pharmacy. 

## 2017-04-20 NOTE — Telephone Encounter (Signed)
Ok to refill 

## 2017-04-27 ENCOUNTER — Ambulatory Visit: Payer: BC Managed Care – PPO | Admitting: Physical Therapy

## 2017-04-27 DIAGNOSIS — R2689 Other abnormalities of gait and mobility: Secondary | ICD-10-CM

## 2017-04-27 DIAGNOSIS — M5441 Lumbago with sciatica, right side: Secondary | ICD-10-CM | POA: Diagnosis not present
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
## 2017-04-27 NOTE — Patient Instructions (Signed)
Discussed sleeping position had pillow but not folded, R thigh crossed midline   Standing flexion - felt stretching/discomfort in R gluteal area   Extension - no symptoms   R rotation - painful, L rotation nothing   Squat R hip IR/adduction anterior tibial translation- pain on R side   MMT hip IR- felt pressure, ER felt nothing, hip flexion felt pressure  SLR- felt pulling   Hip scour -- felt relieving pressure   Hip IR- pain, hip ER ROM - no pain   HS - WNL   CPAs L3, L4/5 grade I-II   Supine clamshells  Prone on elbows

## 2017-05-08 ENCOUNTER — Ambulatory Visit: Payer: BC Managed Care – PPO | Attending: Family Medicine | Admitting: Physical Therapy

## 2017-05-08 DIAGNOSIS — M5441 Lumbago with sciatica, right side: Secondary | ICD-10-CM

## 2017-05-08 DIAGNOSIS — R2689 Other abnormalities of gait and mobility: Secondary | ICD-10-CM
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
## 2017-05-08 NOTE — Patient Instructions (Signed)
Soft tissue mobilization over origin of gluteals and piriformis   Foam rolling over gluteals/piriformis -- 5 bouts x 30" per bout, cuing for set up and technique   Sidelying high volt e-stim applied to gluteal origin at 165V with cold compress applied on top x 10 minutes.   Standing hip abduction with red t-band   Side stepping with red t-band

## 2017-05-10 ENCOUNTER — Ambulatory Visit: Payer: BC Managed Care – PPO | Admitting: Physical Therapy

## 2017-05-14 ENCOUNTER — Ambulatory Visit: Payer: BC Managed Care – PPO | Admitting: Physical Therapy

## 2017-05-17 ENCOUNTER — Ambulatory Visit: Payer: BC Managed Care – PPO | Admitting: Physical Therapy

## 2017-05-21 ENCOUNTER — Ambulatory Visit: Payer: BC Managed Care – PPO

## 2017-05-21 ENCOUNTER — Ambulatory Visit: Payer: BC Managed Care – PPO | Admitting: Physical Therapy

## 2017-05-21 DIAGNOSIS — M5441 Lumbago with sciatica, right side: Secondary | ICD-10-CM | POA: Diagnosis not present

## 2017-05-21 DIAGNOSIS — R2689 Other abnormalities of gait and mobility: Secondary | ICD-10-CM
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
## 2017-05-24 ENCOUNTER — Ambulatory Visit: Payer: BC Managed Care – PPO | Admitting: Physical Therapy

## 2017-05-29 ENCOUNTER — Encounter: Payer: BC Managed Care – PPO | Admitting: Physical Therapy

## 2017-05-31 ENCOUNTER — Ambulatory Visit: Payer: BC Managed Care – PPO | Admitting: Physical Therapy

## 2017-05-31 DIAGNOSIS — R2689 Other abnormalities of gait and mobility: Secondary | ICD-10-CM

## 2017-05-31 DIAGNOSIS — M5441 Lumbago with sciatica, right side: Secondary | ICD-10-CM
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
## 2017-05-31 NOTE — Patient Instructions (Signed)
Hip IR mobilizations with belt   Long axis distractions   Educated on hip IR stretching   Educated on long axis distraction and provided with blue t-band for home.

## 2017-06-07 ENCOUNTER — Ambulatory Visit: Payer: BC Managed Care – PPO | Attending: Family Medicine | Admitting: Physical Therapy

## 2017-06-07 DIAGNOSIS — M5441 Lumbago with sciatica, right side: Secondary | ICD-10-CM | POA: Diagnosis not present

## 2017-06-07 DIAGNOSIS — R2689 Other abnormalities of gait and mobility: Secondary | ICD-10-CM | POA: Diagnosis present
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
## 2017-06-07 NOTE — Patient Instructions (Signed)
1/2 kneeling hip flexor   Standing hip abduction   Lateral band walking

## 2017-06-14 ENCOUNTER — Ambulatory Visit: Payer: BC Managed Care – PPO | Admitting: Physical Therapy

## 2017-06-20 ENCOUNTER — Encounter: Payer: BC Managed Care – PPO | Admitting: Physical Therapy

## 2017-06-21 ENCOUNTER — Encounter: Payer: BC Managed Care – PPO | Admitting: Physical Therapy

## 2017-07-19 ENCOUNTER — Emergency Department: Payer: BC Managed Care – PPO

## 2017-07-19 ENCOUNTER — Telehealth: Payer: Self-pay | Admitting: *Deleted

## 2017-07-19 ENCOUNTER — Observation Stay
Admission: EM | Admit: 2017-07-19 | Discharge: 2017-07-19 | Discharge status: Against Medical Advice | Payer: BC Managed Care – PPO | Attending: Internal Medicine | Admitting: Internal Medicine

## 2017-07-19 ENCOUNTER — Encounter: Payer: Self-pay | Admitting: Emergency Medicine

## 2017-07-19 DIAGNOSIS — I10 Essential (primary) hypertension: Principal | ICD-10-CM | POA: Insufficient documentation

## 2017-07-19 DIAGNOSIS — R079 Chest pain, unspecified: Secondary | ICD-10-CM

## 2017-07-19 DIAGNOSIS — R072 Precordial pain: Secondary | ICD-10-CM | POA: Diagnosis present

## 2017-07-19 DIAGNOSIS — R011 Cardiac murmur, unspecified: Secondary | ICD-10-CM | POA: Diagnosis not present

## 2017-07-19 DIAGNOSIS — Z8249 Family history of ischemic heart disease and other diseases of the circulatory system: Secondary | ICD-10-CM | POA: Insufficient documentation

## 2017-07-19 DIAGNOSIS — Z79899 Other long term (current) drug therapy: Secondary | ICD-10-CM | POA: Insufficient documentation

## 2017-07-19 DIAGNOSIS — Z87891 Personal history of nicotine dependence: Secondary | ICD-10-CM | POA: Insufficient documentation

## 2017-07-19 DIAGNOSIS — R7989 Other specified abnormal findings of blood chemistry: Secondary | ICD-10-CM

## 2017-07-19 DIAGNOSIS — R778 Other specified abnormalities of plasma proteins: Secondary | ICD-10-CM

## 2017-07-19 LAB — CBC
HEMATOCRIT: 40.6 % (ref 35.0–47.0)
HEMOGLOBIN: 14.1 g/dL (ref 12.0–16.0)
MCH: 31.1 pg (ref 26.0–34.0)
MCHC: 34.7 g/dL (ref 32.0–36.0)
MCV: 89.7 fL (ref 80.0–100.0)
Platelets: 258 10*3/uL (ref 150–440)
RBC: 4.53 MIL/uL (ref 3.80–5.20)
RDW: 14.2 % (ref 11.5–14.5)
WBC: 11.2 10*3/uL — AB (ref 3.6–11.0)

## 2017-07-19 LAB — BASIC METABOLIC PANEL
ANION GAP: 11 (ref 5–15)
BUN: 28 mg/dL — ABNORMAL HIGH (ref 6–20)
CALCIUM: 9.6 mg/dL (ref 8.9–10.3)
CO2: 23 mmol/L (ref 22–32)
Chloride: 101 mmol/L (ref 101–111)
Creatinine, Ser: 1.31 mg/dL — ABNORMAL HIGH (ref 0.44–1.00)
GFR calc Af Amer: 49 mL/min — ABNORMAL LOW (ref >60)
GFR, EST NON AFRICAN AMERICAN: 42 mL/min — AB (ref >60)
GLUCOSE: 105 mg/dL — AB (ref 65–99)
POTASSIUM: 4.2 mmol/L (ref 3.5–5.1)
SODIUM: 135 mmol/L (ref 135–145)

## 2017-07-19 LAB — TROPONIN I: TROPONIN I: 0.04 ng/mL — AB (ref <0.03)

## 2017-07-19 IMAGING — CR DG CHEST 2V
1 series · 2 of 2 positions shown · non-contrast
Comparison: [DATE]

CLINICAL DATA: Back pain for 2 weeks

EXAM:
CHEST  2 VIEW

[Series 1: w chest pa · 0.14mm/px · 2 of 2 slices shown]
[im 1/2]
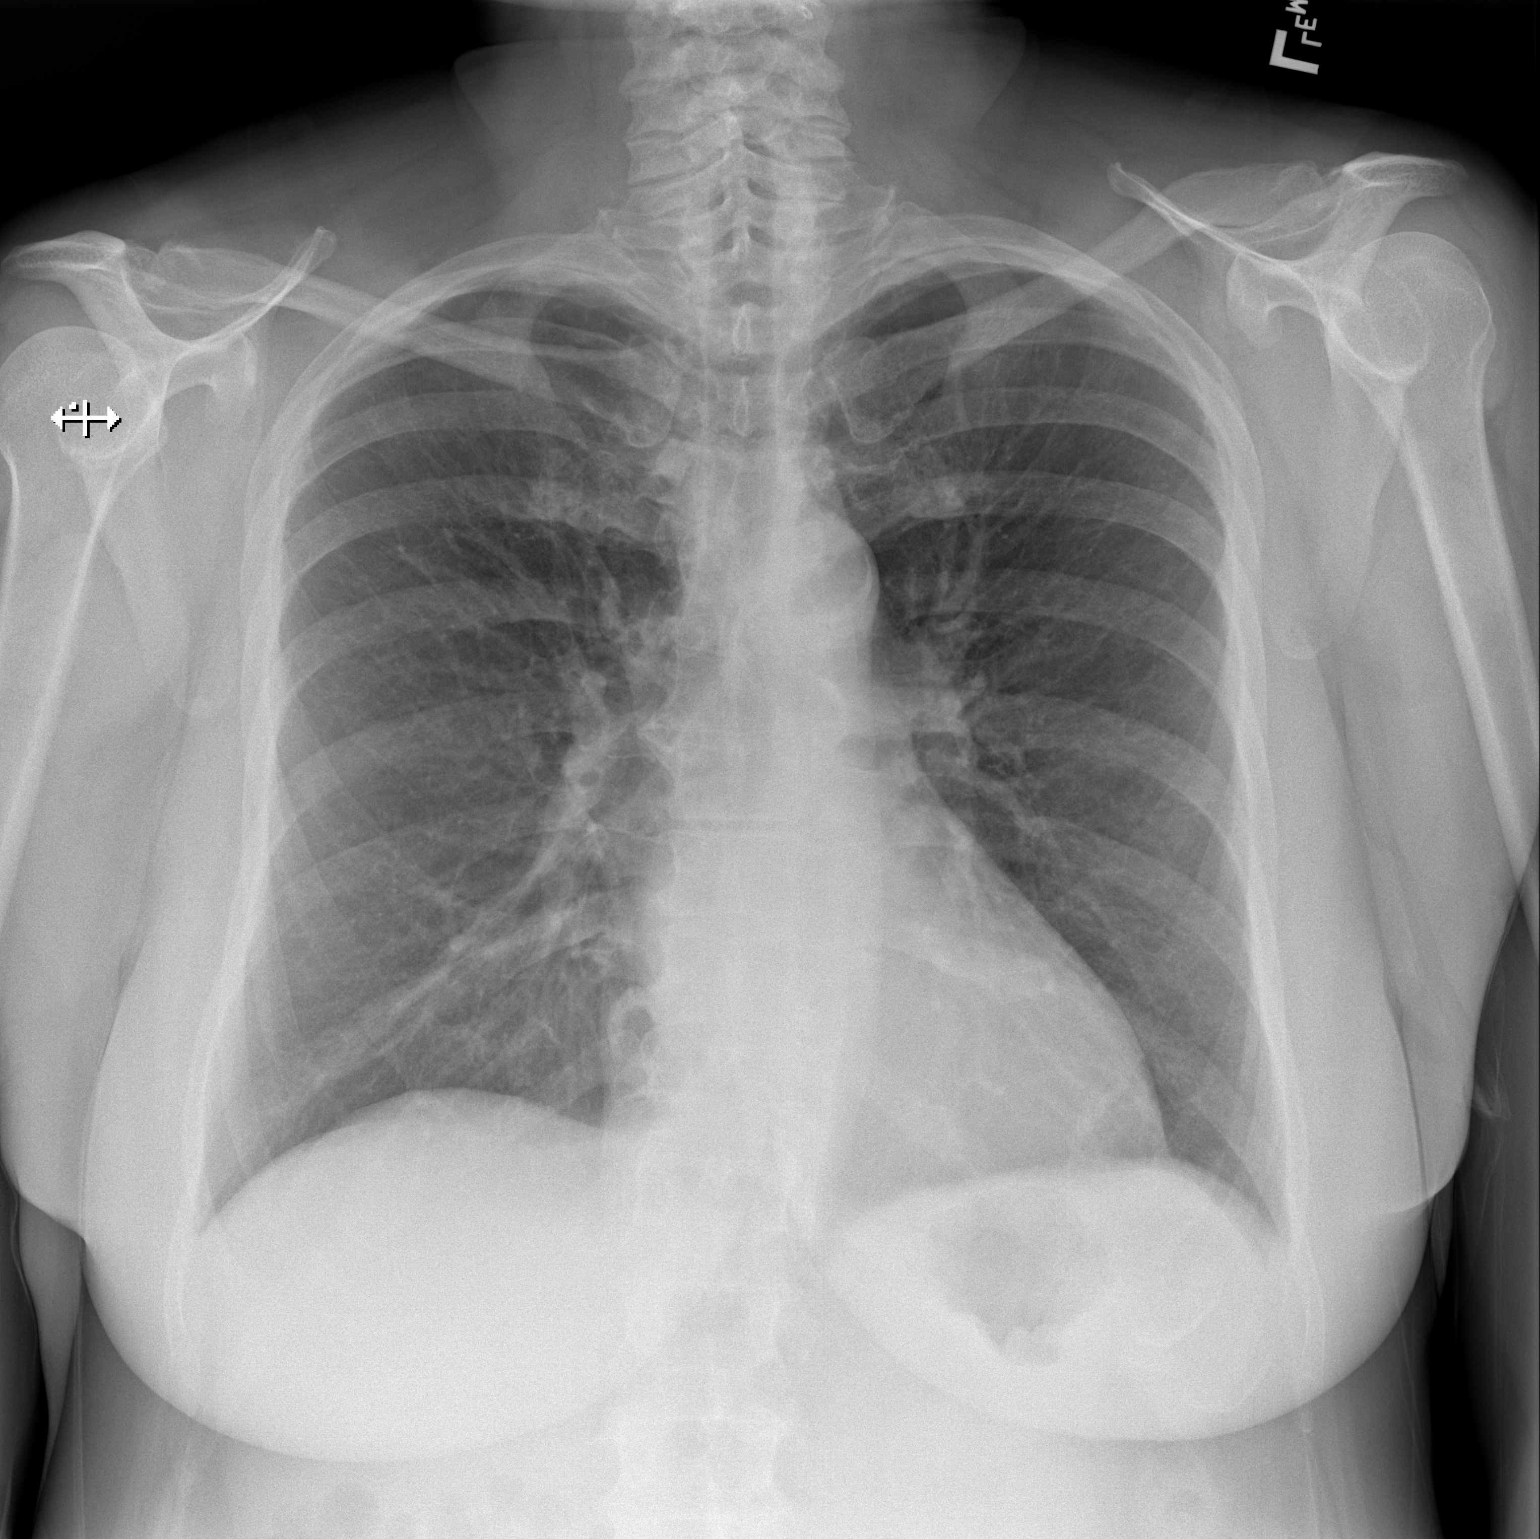
[im 2/2]
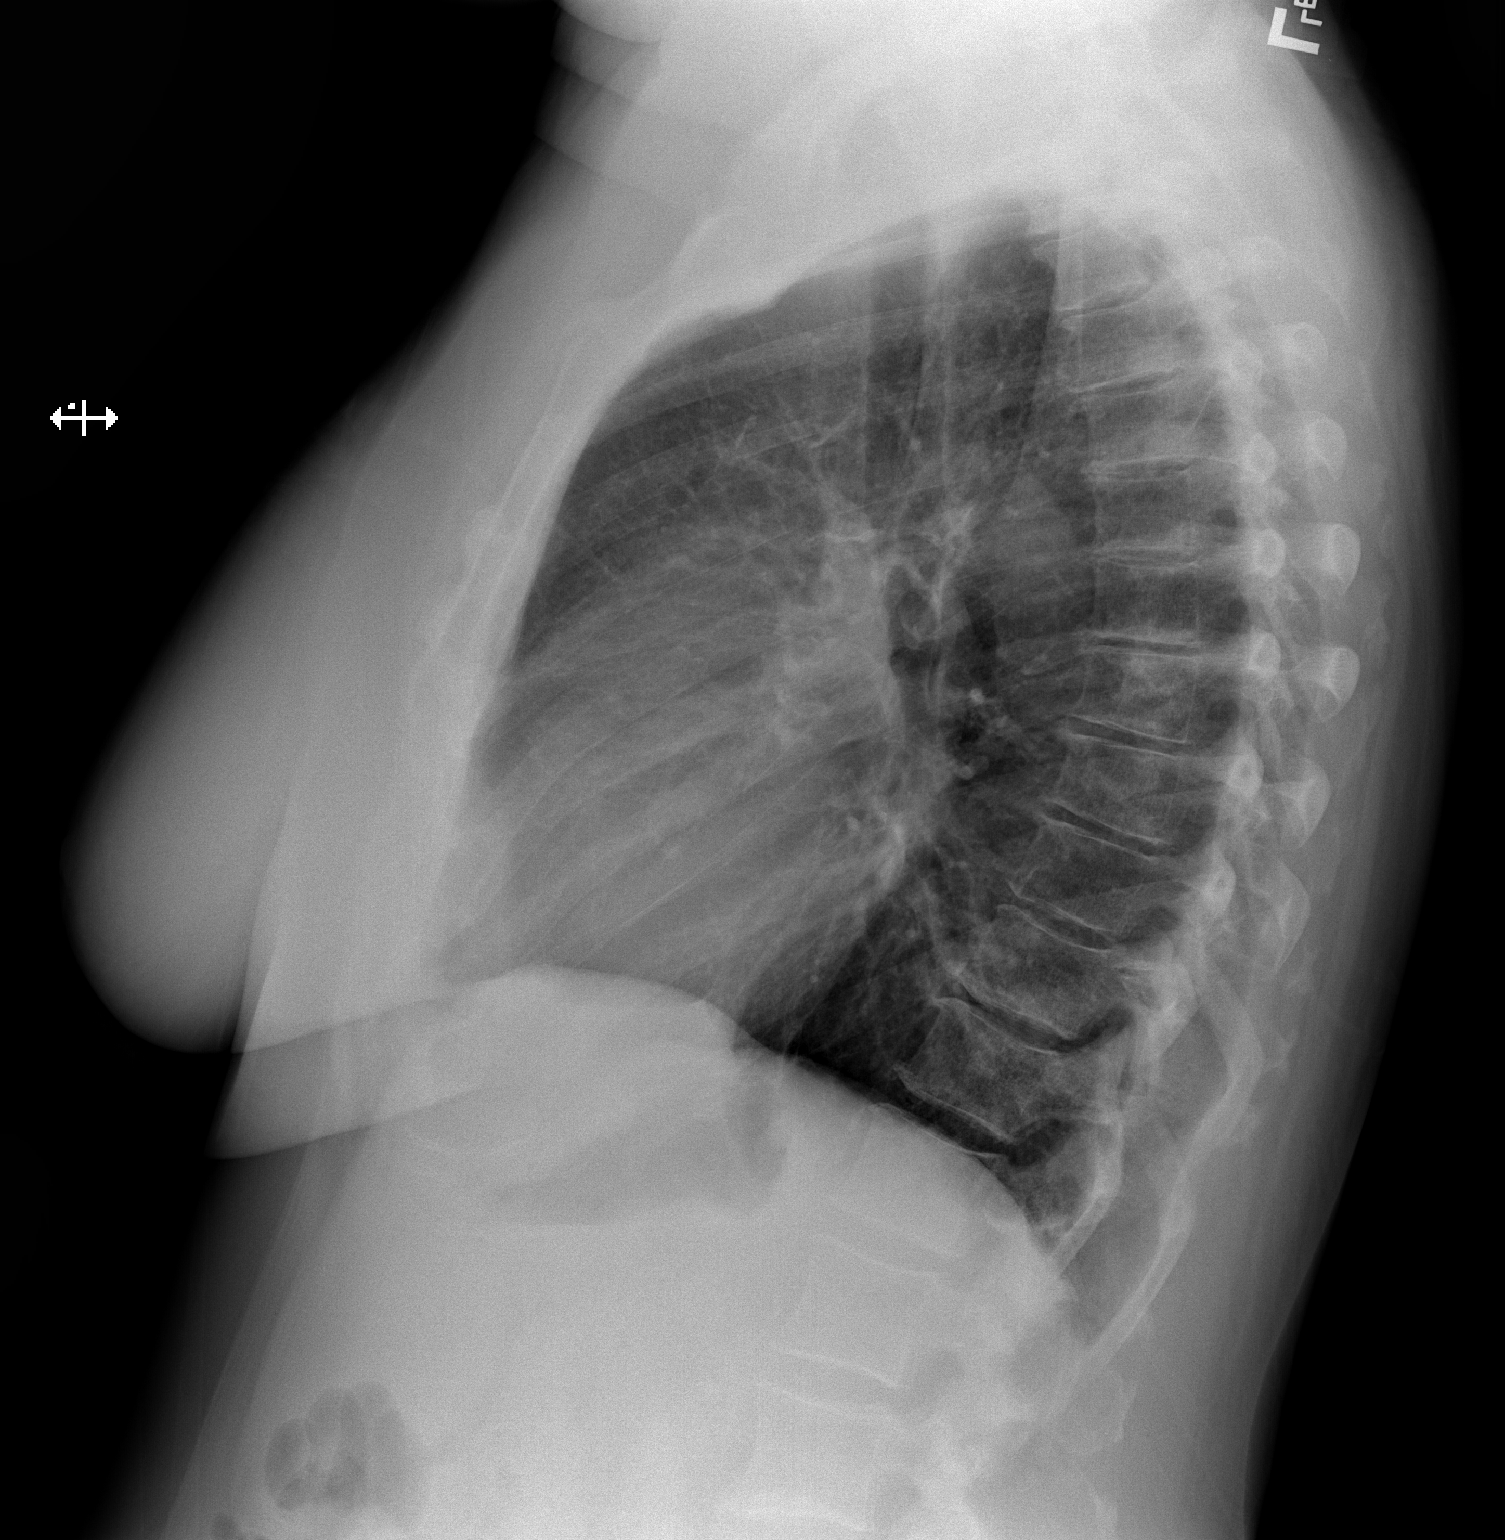

[2 of 2 positions shown; findings below may reference images not displayed]

FINDINGS: The heart size and mediastinal contours are within normal limits.
Both lungs are clear. The visualized skeletal structures are
unremarkable.
IMPRESSION: No active cardiopulmonary disease.

## 2017-07-19 IMAGING — CT CT ANGIO CHEST
3 of 7 series · 18 of 36 positions shown · IV contrast (APPLIED)
Comparison: Chest radiograph [DATE]

CLINICAL DATA: Chest pain

EXAM:
CT ANGIOGRAPHY CHEST WITH CONTRAST
TECHNIQUE: Initially, axial CT images were obtained through the chest without
intravenous contrast maternal administration. Multidetector CT
imaging of the chest was performed using the standard protocol
during bolus administration of intravenous contrast. Multiplanar CT
image reconstructions and MIPs were obtained to evaluate the
vascular anatomy.
CONTRAST:  75 mL Isovue 370 nonionic

[Series 6: axial arterial · axial · arterial · 0.71mm/px · z∈[-335,-77]mm · 11 of 104 slices shown]
[im 9/104  lung]
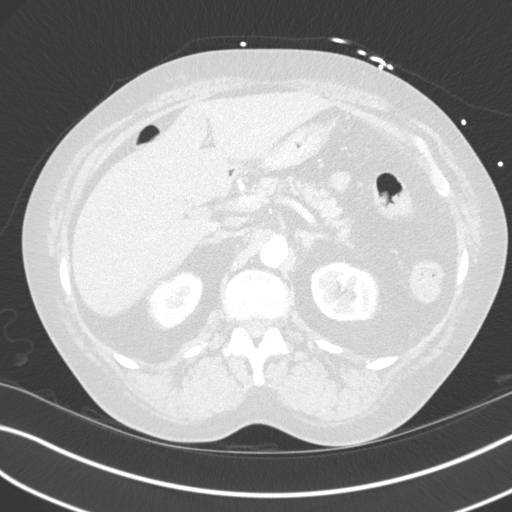
[im 18/104  mediastinal]
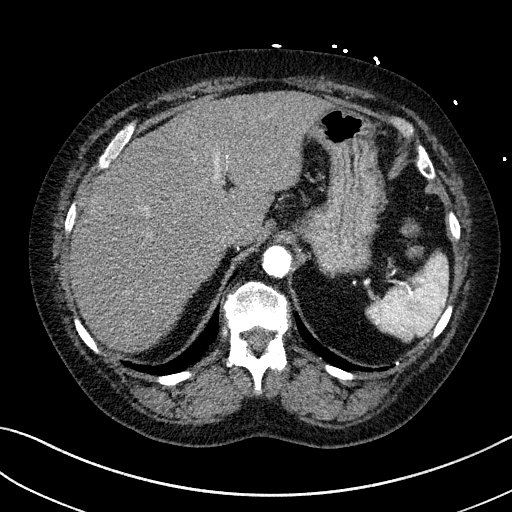
[im 26/104  lung]
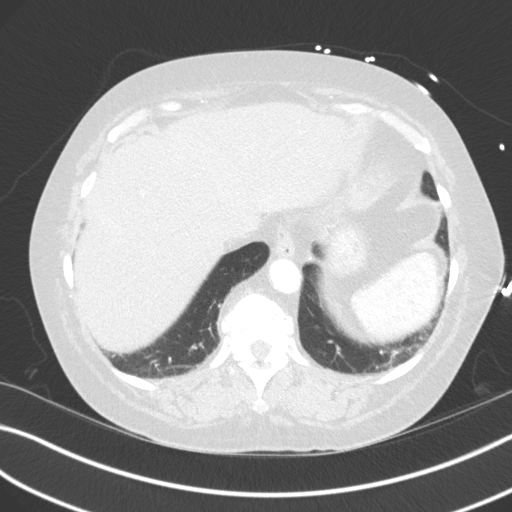
[im 35/104  mediastinal]
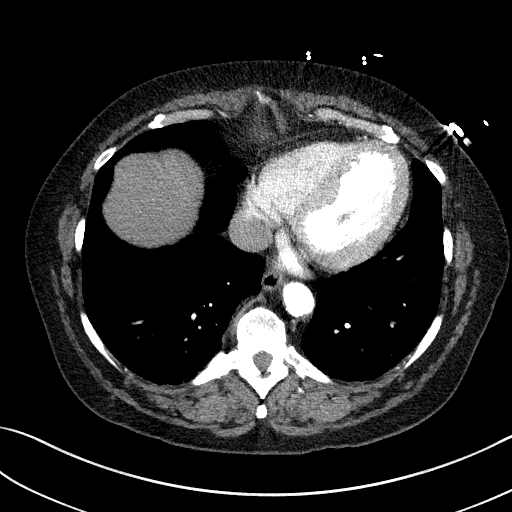
[im 43/104  lung]
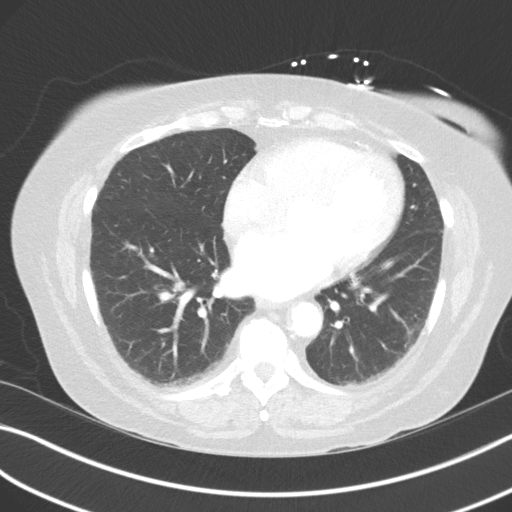
[im 52/104  mediastinal]
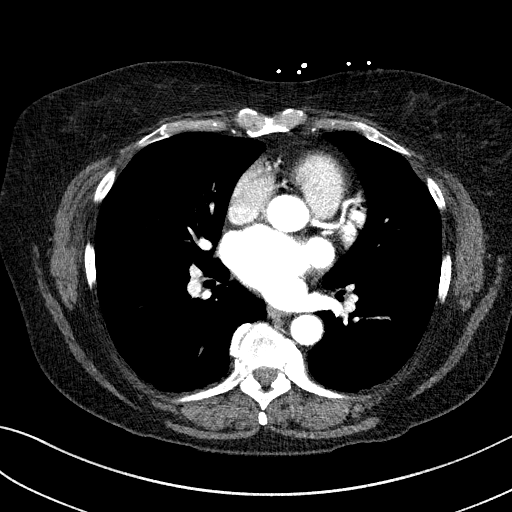
[im 61/104  lung]
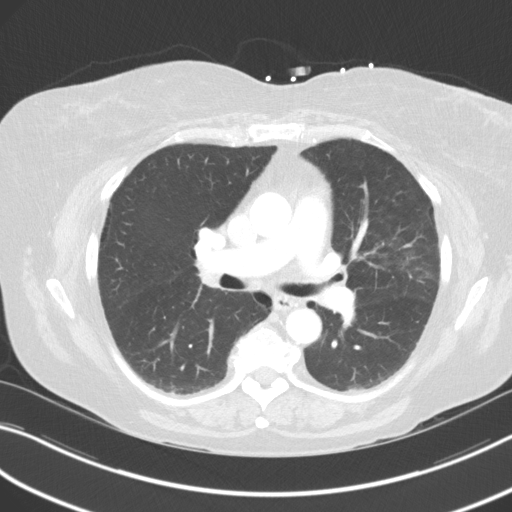
[im 69/104  mediastinal]
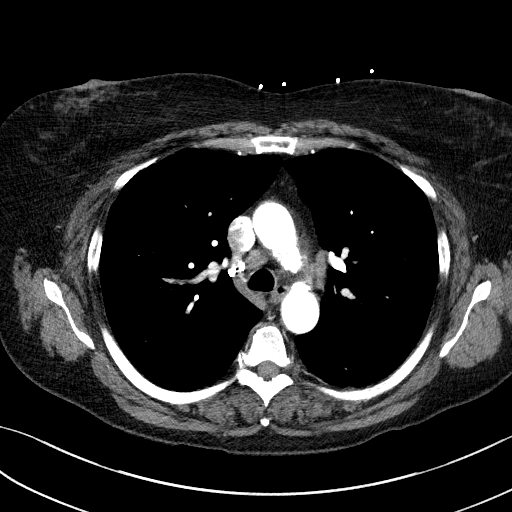
[im 78/104  lung]
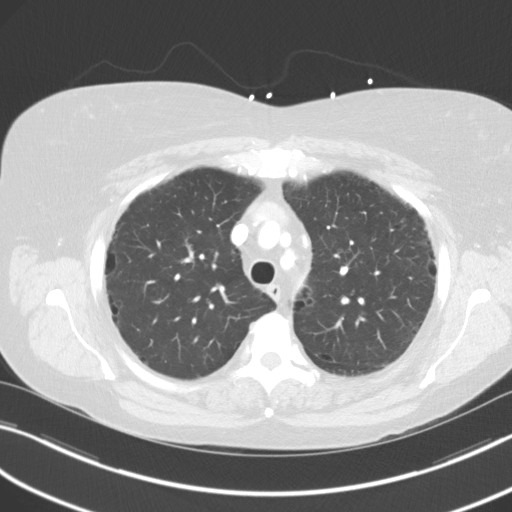
[im 86/104  mediastinal]
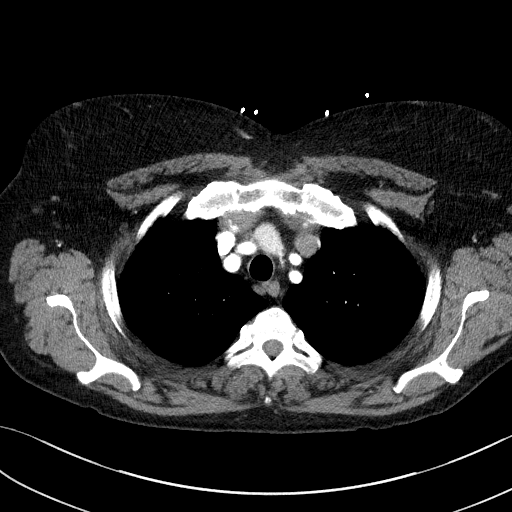
[im 95/104  lung]
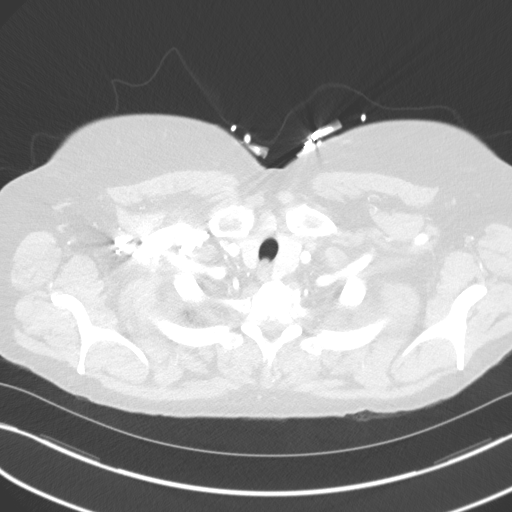

[Series 7: lung · axial · 0.71mm/px · z∈[-315,-95]mm · 6 of 62 slices shown]
[im 9/62  mediastinal]
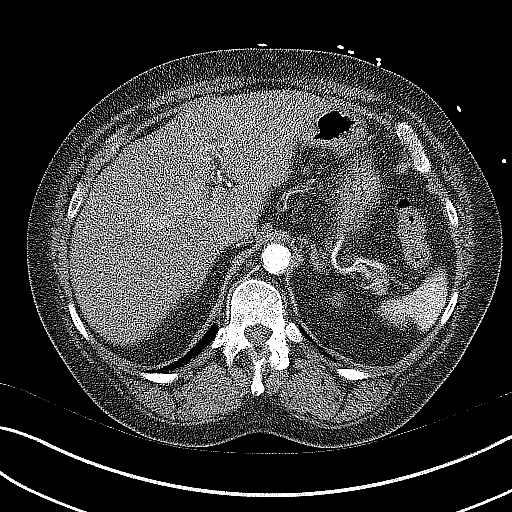
[im 18/62  mediastinal]
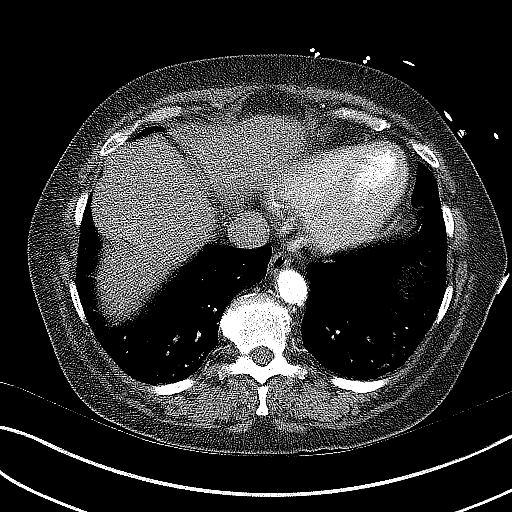
[im 27/62  mediastinal]
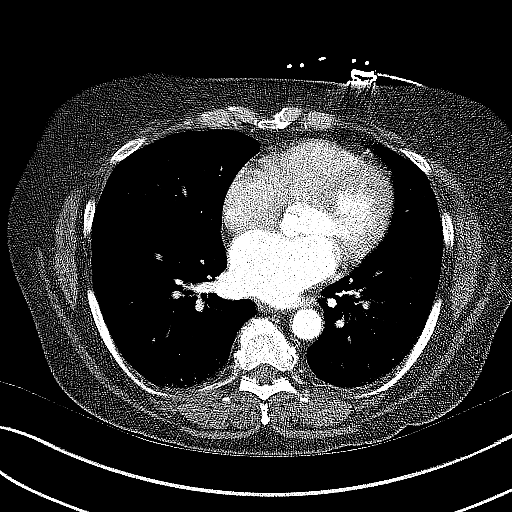
[im 35/62  mediastinal]
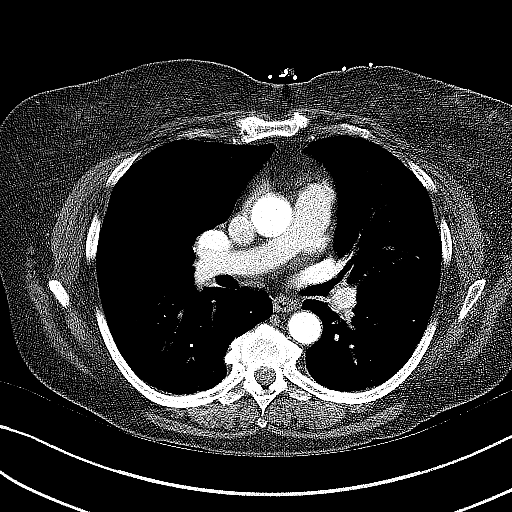
[im 44/62  mediastinal]
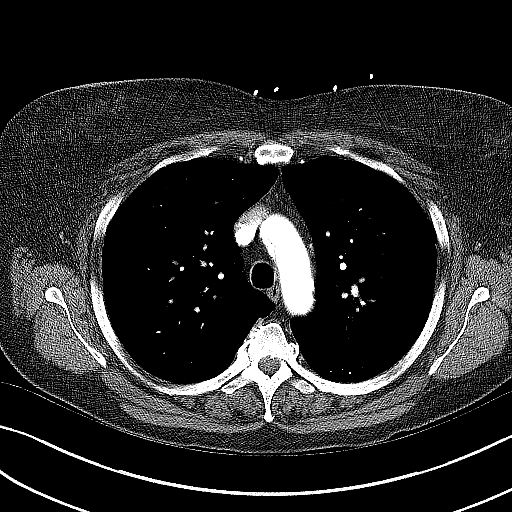
[im 53/62  mediastinal]
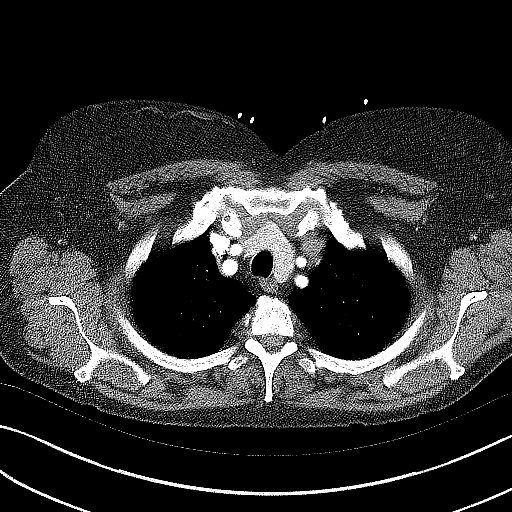

[Series 8: coronals · coronal · 0.70mm/px · 1 of 158 slices shown]
[im 79/158  mediastinal]
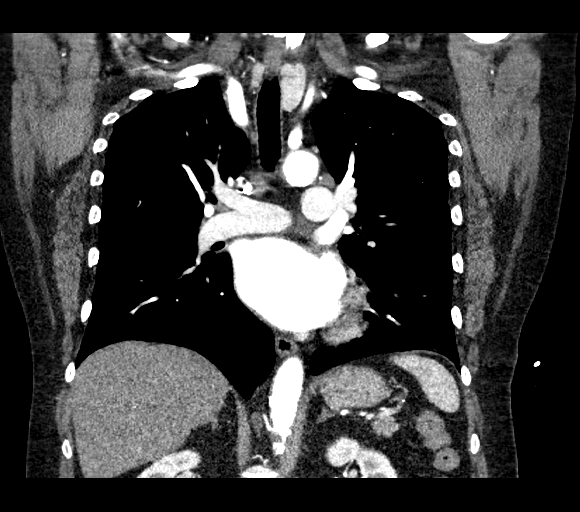

[18 of 36 positions shown; findings below may reference images not displayed]

FINDINGS: Cardiovascular: There is no intramural hematoma evident on
noncontrast enhanced study. There is no appreciable thoracic aortic
aneurysm or dissection. There is atherosclerotic calcification in
the proximal great vessels without hemodynamically significant
obstruction. There is atherosclerotic calcification in the aorta
with mild plaque in the arch region. There is a small amount of
calcification in the aortic valve. Pericardium is not appreciably
thickened. No evident pulmonary embolus.

Mediastinum/Nodes: Left lobe of thyroid is larger than right lobe of
thyroid. There is no dominant thyroid lesion. Slight inhomogeneity
in the left lobe may indicate mild multinodular goiter. There are
occasional subcentimeter mediastinal lymph nodes. There is a lymph
node to the right of the distal trachea anteriorly measuring 1.3 x
1.0 cm. No esophageal lesions are evident. Prominence

Lungs/Pleura: There is a degree of underlying centrilobular
emphysematous change. There are small bullae along the periphery of
each upper lobe. There is mild bibasilar atelectatic change. There
is no edema or consolidation. No pleural effusion or pleural
thickening.

Upper Abdomen: There is atherosclerotic calcification in the aorta
and proximal left renal artery. Visualized upper abdominal
structures otherwise appear unremarkable.

Musculoskeletal: There are foci of degenerative change in the
thoracic spine. There are no blastic or lytic bone lesions. There is
also degenerative change in the sternomanubrial joint.

Review of the MIP images confirms the above findings.
IMPRESSION: 1. No thoracic aortic aneurysm or dissection. There are foci of
calcification in the thoracic aorta and great vessels. There is mild
calcification in the aortic valve. No evident pulmonary embolus.

2. There is a degree of centrilobular emphysematous change. No edema
or consolidation. There is bibasilar atelectasis.

3. Single mildly prominent pretracheal lymph node, likely reactive
in etiology.

Aortic Atherosclerosis ([CX]-[CX]) and Emphysema ([CX]-[CX]).

## 2017-07-19 MED ORDER — IOPAMIDOL (ISOVUE-370) INJECTION 76%
75.0000 mL | Freq: Once | INTRAVENOUS | Status: AC | PRN
  Administered 2017-07-19: 75 mL via INTRAVENOUS

## 2017-07-19 MED ORDER — FENTANYL CITRATE (PF) 100 MCG/2ML IJ SOLN
50.0000 ug | INTRAMUSCULAR | Status: DC | PRN
  Administered 2017-07-19: 50 ug via INTRAVENOUS
  Filled 2017-07-19: qty 2

## 2017-07-19 MED ORDER — ASPIRIN EC 325 MG PO TBEC: 325.0000 mg | DELAYED_RELEASE_TABLET | Freq: Every day | ORAL | Status: DC

## 2017-07-19 MED ORDER — KETOROLAC TROMETHAMINE 30 MG/ML IJ SOLN: 30.0000 mg | Freq: Four times a day (QID) | INTRAMUSCULAR | Status: DC

## 2017-07-19 MED ORDER — HYDRALAZINE HCL 20 MG/ML IJ SOLN: 10.0000 mg | Freq: Four times a day (QID) | INTRAMUSCULAR | Status: DC | PRN

## 2017-07-19 MED ORDER — GI COCKTAIL ~~LOC~~: 30.0000 mL | Freq: Three times a day (TID) | ORAL | Status: DC

## 2017-07-19 MED ORDER — SODIUM CHLORIDE 0.9 % IV BOLUS (SEPSIS)
500.0000 mL | Freq: Once | INTRAVENOUS | Status: AC
  Administered 2017-07-19: 500 mL via INTRAVENOUS

## 2017-07-19 MED ORDER — NITROGLYCERIN 2 % TD OINT
0.5000 [in_us] | TOPICAL_OINTMENT | Freq: Once | TRANSDERMAL | Status: AC
  Administered 2017-07-19: 0.5 [in_us] via TOPICAL
  Filled 2017-07-19: qty 1

## 2017-07-19 MED ORDER — NITROGLYCERIN 0.4 MG SL SUBL: 0.4000 mg | SUBLINGUAL_TABLET | SUBLINGUAL | Status: DC | PRN

## 2017-07-19 MED ORDER — ASPIRIN 81 MG PO CHEW
324.0000 mg | CHEWABLE_TABLET | Freq: Once | ORAL | Status: AC
  Administered 2017-07-19: 324 mg via ORAL
  Filled 2017-07-19: qty 4

## 2017-07-19 NOTE — Telephone Encounter (Signed)
Received call from patient.   States that she has pressure in the center of her chest that feels like gas or indigestion. Reports that she has had pressure x2 weeks.States that pain will move from center of chest through to her back and will radiate towards L axilla. Pain is unresolved by antacids or Gas-x.   Denies nausea, SOB, HA, jaw pain. Advised to go to ER for eval.

## 2017-07-19 NOTE — H&P (Signed)
[facility] NAME: Tracy James    MR#:  [id number]  DATE OF BIRTH:  [birth year]  DATE OF ADMISSION:  07/19/2017  PRIMARY CARE PHYSICIAN: Alycia Rossetti, MD   REQUESTING/REFERRING PHYSICIAN:  Quentin Cornwall   CHIEF COMPLAINT:   Chief Complaint  Patient presents with  . Chest Pain    HISTORY OF PRESENT ILLNESS: Tracy James  is a 66 y.o. female with a known history of Central hypertension seasonal allergies is presenting to the hospital with complaint of having chest pain ongoing for the past 2 weeks. Patient describes the pain as a pressure-like sensation in the central area of her chest with pain radiating to her left arm as well. She reports that certain positions make it better. Laying flat makes it worse. She does not have associated shortness of breath no nausea vomiting or diarrhea. PAST MEDICAL HISTORY:   Past Medical History:  Diagnosis Date  . Allergy    SEASONAL  . Heart murmur   . Hypertension     PAST SURGICAL HISTORY: History reviewed. No pertinent surgical history.  SOCIAL HISTORY:  Social History  Substance Use Topics  . Smoking status: Former Smoker    Quit date: 12/05/2011  . Smokeless tobacco: Never Used  . Alcohol use Yes     Comment: occasionally    FAMILY HISTORY:  Family History  Problem Relation Age of Onset  . Arthritis Mother   . Cancer Mother        colon cancer  . Diabetes Mother   . Hypertension Mother   . Cancer Brother        LUNG- smoking, [place]est[place]  . Early death Brother   . Arthritis Maternal Grandmother   . Heart disease Maternal Grandmother        massive heart attack  . Heart disease Paternal Grandmother   . Asthma Brother   . Diabetes Brother   . Hypertension Brother   . Heart disease Father        anuersym- ruptered    DRUG ALLERGIES: No Known Allergies  REVIEW OF SYSTEMS:   CONSTITUTIONAL: No fever, fatigue or weakness.  EYES: No blurred or double vision.  EARS, NOSE,  AND THROAT: No tinnitus or ear pain.  RESPIRATORY: No cough, shortness of breath, wheezing or hemoptysis.  CARDIOVASCULAR: positive chest pain, orthopnea, edema.  GASTROINTESTINAL: No nausea, vomiting, diarrhea or abdominal pain.  GENITOURINARY: No dysuria, hematuria.  ENDOCRINE: No polyuria, nocturia,  HEMATOLOGY: No anemia, easy bruising or bleeding SKIN: No rash or lesion. MUSCULOSKELETAL: No joint pain or arthritis.   NEUROLOGIC: No tingling, numbness, weakness.  PSYCHIATRY: No anxiety or depression.   MEDICATIONS AT HOME:  Prior to Admission medications   Medication Sig Start Date End Date Taking? Authorizing Provider  Calcium 600-200 MG-UNIT tablet Take 2 tablets by mouth daily. 09/22/16  Yes [place], Modena Nunnery, MD  Capsicum, [place], (CAYENNE PO) Take by mouth.   Yes [provider]  hydrochlorothiazide (HYDRODIURIL) 25 MG tablet TAKE ONE TABLET BY MOUTH ONCE DAILY 09/27/16  Yes [place], Modena Nunnery, MD  Omega-3 1000 MG CAPS Take by mouth.   Yes [provider]  Turmeric 500 MG TABS Take by mouth.   Yes [provider]  cyclobenzaprine (FLEXERIL) 10 MG tablet TAKE 1 TABLET BY MOUTH THREE TIMES DAILY AS NEEDED FOR MUSCLE SPASMS Patient not taking: Reported on 07/19/2017 04/20/17   Alycia Rossetti, MD  naproxen (NAPROSYN) 500 MG tablet Take 1 tablet (500  mg total) by mouth 2 (two) times daily with a meal. Patient not taking: Reported on 04/18/2017 04/02/17   Alycia Rossetti, MD      PHYSICAL EXAMINATION:   VITAL SIGNS: Blood pressure (!) 152/71, pulse 85, temperature 98.4 F (36.9 C), temperature source Oral, resp. rate 16, height 5\' 6"  (1.676 m), weight 182 lb (82.6 kg), SpO2 98 %.  GENERAL:  66 y.o.-year-old patient lying in the bed with no acute distress.  EYES: Pupils equal, round, reactive to light and accommodation. No scleral icterus. Extraocular muscles intact.  HEENT: Head atraumatic, normocephalic. Oropharynx and nasopharynx clear.  NECK:   Supple, no jugular venous distention. No thyroid enlargement, no tenderness.  LUNGS: Normal breath sounds bilaterally, no wheezing, rales,rhonchi or crepitation. No use of accessory muscles of respiration.  CARDIOVASCULAR: S1, S2 normal. No murmurs, rubs, or gallops.  ABDOMEN: Soft, nontender, nondistended. Bowel sounds present. No organomegaly or mass.  EXTREMITIES: No pedal edema, cyanosis, or clubbing.  NEUROLOGIC: Cranial nerves II through XII are intact. Muscle strength 5/5 in all extremities. Sensation intact. Gait not checked.  PSYCHIATRIC: The patient is alert and oriented x 3.  SKIN: No obvious rash, lesion, or ulcer.   LABORATORY PANEL:   CBC  Recent Labs Lab 07/19/17 1244  WBC 11.2*  HGB 14.1  HCT 40.6  PLT 258  MCV 89.7  MCH 31.1  MCHC 34.7  RDW 14.2   ------------------------------------------------------------------------------------------------------------------  Chemistries   Recent Labs Lab 07/19/17 1244  NA 135  K 4.2  CL 101  CO2 23  GLUCOSE 105*  BUN 28*  CREATININE 1.31*  CALCIUM 9.6   ------------------------------------------------------------------------------------------------------------------ estimated creatinine clearance is 47 mL/min (A) (by C-G formula based on SCr of 1.31 mg/dL (H)). ------------------------------------------------------------------------------------------------------------------ No results for input(s): TSH, T4TOTAL, T3FREE, THYROIDAB in the last 72 hours.  Invalid input(s): FREET3   Coagulation profile No results for input(s): INR, PROTIME in the last 168 hours. ------------------------------------------------------------------------------------------------------------------- No results for input(s): DDIMER in the last 72 hours. -------------------------------------------------------------------------------------------------------------------  Cardiac Enzymes  Recent Labs Lab 07/19/17 1244  TROPONINI 0.04*    ------------------------------------------------------------------------------------------------------------------ Invalid input(s): POCBNP  ---------------------------------------------------------------------------------------------------------------  Urinalysis No results found for: COLORURINE, APPEARANCEUR, LABSPEC, PHURINE, GLUCOSEU, HGBUR, BILIRUBINUR, KETONESUR, PROTEINUR, UROBILINOGEN, NITRITE, LEUKOCYTESUR   RADIOLOGY: Dg Chest 2 View  Result Date: 07/19/2017 CLINICAL DATA:  Back pain for 2 weeks EXAM: CHEST  2 VIEW COMPARISON:  09/06/2005 FINDINGS: The heart size and mediastinal contours are within normal limits. Both lungs are clear. The visualized skeletal structures are unremarkable. IMPRESSION: No active cardiopulmonary disease. Electronically Signed   By: Inez Catalina M.D.   On: 07/19/2017 13:05   Ct Angio Chest Aorta W And/or Wo Contrast  Result Date: 07/19/2017 CLINICAL DATA:  Chest pain EXAM: CT ANGIOGRAPHY CHEST WITH CONTRAST TECHNIQUE: Initially, axial CT images were obtained through the chest without intravenous contrast maternal administration. Multidetector CT imaging of the chest was performed using the standard protocol during bolus administration of intravenous contrast. Multiplanar CT image reconstructions and MIPs were obtained to evaluate the vascular anatomy. CONTRAST:  75 mL Isovue 370 nonionic COMPARISON:  Chest radiograph July 19, 2017 FINDINGS: Cardiovascular: There is no intramural hematoma evident on noncontrast enhanced study. There is no appreciable thoracic aortic aneurysm or dissection. There is atherosclerotic calcification in the proximal great vessels without hemodynamically significant obstruction. There is atherosclerotic calcification in the aorta with mild plaque in the arch region. There is a small amount of calcification in the aortic valve. Pericardium is not appreciably thickened. No evident pulmonary  embolus. Mediastinum/Nodes: Left lobe of  thyroid is larger than right lobe of thyroid. There is no dominant thyroid lesion. Slight inhomogeneity in the left lobe may indicate mild multinodular goiter. There are occasional subcentimeter mediastinal lymph nodes. There is a lymph node to the right of the distal trachea anteriorly measuring 1.3 x 1.0 cm. No esophageal lesions are evident. Prominence Lungs/Pleura: There is a degree of underlying centrilobular emphysematous change. There are small bullae along the periphery of each upper lobe. There is mild bibasilar atelectatic change. There is no edema or consolidation. No pleural effusion or pleural thickening. Upper Abdomen: There is atherosclerotic calcification in the aorta and proximal left renal artery. Visualized upper abdominal structures otherwise appear unremarkable. Musculoskeletal: There are foci of degenerative change in the thoracic spine. There are no blastic or lytic bone lesions. There is also degenerative change in the sternomanubrial joint. Review of the MIP images confirms the above findings. IMPRESSION: 1. No thoracic aortic aneurysm or dissection. There are foci of calcification in the thoracic aorta and great vessels. There is mild calcification in the aortic valve. No evident pulmonary embolus. 2. There is a degree of centrilobular emphysematous change. No edema or consolidation. There is bibasilar atelectasis. 3. Single mildly prominent pretracheal lymph node, likely reactive in etiology. Aortic Atherosclerosis (ICD10-I70.0) and Emphysema (ICD10-J43.9). Electronically Signed   By: Lowella Grip III M.D.   On: 07/19/2017 14:51    EKG: Orders placed or performed during the hospital encounter of 07/19/17  . EKG 12-Lead  . EKG 12-Lead  . ED EKG within 10 minutes  . ED EKG within 10 minutes    IMPRESSION AND PLAN: Patient is 66 year old African-American female with chest  1. Chest pain Will admit to telemetry Obtain echocardiogram Obtain a stress test in the  morning Aspirin therapy  2. Essential hypertension Start metoprolol Continue HCTZ Use when necessary IV hydralazine  3. Miscellaneous Lovenox for DVT prophylaxis     All the records are reviewed and case discussed with ED provider. Management plans discussed with the patient, family and they are in agreement.  CODE STATUS: Code Status History    This patient does not have a recorded code status. Please follow your organizational policy for patients in this situation.       TOTAL TIME TAKING CARE OF THIS PATIENT:55 minutes.    Dustin Flock M.D on 07/19/2017 at 3:28 PM  Between 7am to 6pm - Pager - [phone]  After 6pm go to [email or web address] - password [email or web address]  [facility]  Office  [phone]  CC: Primary care physician; Alycia Rossetti, MD

## 2017-07-19 NOTE — ED Notes (Signed)
Admitting MD , nurse and patient advocate discussed admission status, however pt is persistent on leaving AMA.

## 2017-07-19 NOTE — ED Triage Notes (Signed)
Pt in via POV with complaints of centralized chest pain radiating through to back and left shoulder x approximately 2 weeks.  Pt denies any accompanying symptoms.  Pt reports pain is positional.  Ambulatory to triage room.  NAD noted at this time.

## 2017-07-19 NOTE — ED Notes (Signed)
First Nurse: pt ambulatory to stat desk with reports of upper back pain radiating around into epigastric pain, only when moving, tender to palpation.

## 2017-07-19 NOTE — ED Provider Notes (Signed)
[facility] Emergency Department Provider Note    First MD Initiated Contact with Patient 07/19/17 1325     (approximate)  I have reviewed the triage vital signs and the nursing notes.   HISTORY  Chief Complaint Chest Pain    HPI Tracy James is a 66 y.o. female presents with chief complaint of 2 weeks of midsternal stabbing chest pain radiating through to her back. Pain does change with position and movement. Patient states she has difficulty finding a comfortable position. Denies any shortness of breath. States that she thought it was gas and took some Gas-X but has not had any improvement. Denies any nausea or vomiting. No diaphoresis. No recent fevers. No previous history of heart attacks. Does have a history of smoking but does not currently. Does have a history of hypertension.   Past Medical History:  Diagnosis Date  . Allergy    SEASONAL  . Heart murmur   . Hypertension    Family History  Problem Relation Age of Onset  . Arthritis Mother   . Cancer Mother        colon cancer  . Diabetes Mother   . Hypertension Mother   . Cancer Brother        LUNG- smoking, Abestos  . Early death Brother   . Arthritis Maternal Grandmother   . Heart disease Maternal Grandmother        massive heart attack  . Heart disease Paternal Grandmother   . Asthma Brother   . Diabetes Brother   . Hypertension Brother   . Heart disease Father        anuersym- ruptered   History reviewed. No pertinent surgical history. Patient Active Problem List   Diagnosis Date Noted  . Borderline diabetic 09/22/2016  . Overweight (BMI 25.0-29.9) 09/22/2016  . Heart murmur 09/22/2016  . Hypertriglyceridemia 03/20/2016  . Essential hypertension, benign 09/21/2015      Prior to Admission medications   Medication Sig Start Date End Date Taking? Authorizing Provider  Calcium 600-200 MG-UNIT tablet Take 2 tablets by mouth daily. 09/22/16  Yes [place], Modena Nunnery, MD    Capsicum, [place], (CAYENNE PO) Take by mouth.   Yes [provider]  hydrochlorothiazide (HYDRODIURIL) 25 MG tablet TAKE ONE TABLET BY MOUTH ONCE DAILY 09/27/16  Yes [place], Modena Nunnery, MD  Omega-3 1000 MG CAPS Take by mouth.   Yes [provider]  Turmeric 500 MG TABS Take by mouth.   Yes [provider]  cyclobenzaprine (FLEXERIL) 10 MG tablet TAKE 1 TABLET BY MOUTH THREE TIMES DAILY AS NEEDED FOR MUSCLE SPASMS Patient not taking: Reported on 07/19/2017 04/20/17   Alycia Rossetti, MD  naproxen (NAPROSYN) 500 MG tablet Take 1 tablet (500 mg total) by mouth 2 (two) times daily with a meal. Patient not taking: Reported on 04/18/2017 04/02/17   Alycia Rossetti, MD    Allergies Patient has no known allergies.    Social History Social History  Substance Use Topics  . Smoking status: Former Smoker    Quit date: 12/05/2011  . Smokeless tobacco: Never Used  . Alcohol use Yes     Comment: occasionally    Review of Systems Patient denies headaches, rhinorrhea, blurry vision, numbness, shortness of breath, chest pain, edema, cough, abdominal pain, nausea, vomiting, diarrhea, dysuria, fevers, rashes or hallucinations unless otherwise stated above in HPI. ____________________________________________   PHYSICAL EXAM:  VITAL SIGNS: Vitals:   07/19/17 1245  BP: (!) 152/71  Pulse: 85  Resp: 16  Temp: 98.4 F (36.9 C)  SpO2: 98%    Constitutional: Alert and oriented. Well appearing and in no acute distress. Eyes: Conjunctivae are normal.  Head: Atraumatic. Nose: No congestion/rhinnorhea. Mouth/Throat: Mucous membranes are moist.   Neck: No stridor. Painless ROM.  Cardiovascular: Normal rate, regular rhythm. Grossly normal heart sounds.  Good peripheral circulation. Respiratory: Normal respiratory effort.  No retractions. Lungs CTAB. Gastrointestinal: Soft and nontender. No distention. No abdominal bruits. No CVA tenderness. Genitourinary:   Musculoskeletal: No lower extremity tenderness nor edema.  No joint effusions. Neurologic:  Normal speech and language. No gross focal neurologic deficits are appreciated. No facial droop Skin:  Skin is warm, dry and intact. No rash noted. Psychiatric: Mood and affect are normal. Speech and behavior are normal.  ____________________________________________   LABS (all labs ordered are listed, but only abnormal results are displayed)  Results for orders placed or performed during the hospital encounter of 07/19/17 (from the past 24 hour(s))  Basic metabolic panel     Status: Abnormal   Collection Time: 07/19/17 12:44 PM  Result Value Ref Range   Sodium 135 135 - 145 mmol/L   Potassium 4.2 3.5 - 5.1 mmol/L   Chloride 101 101 - 111 mmol/L   CO2 23 22 - 32 mmol/L   Glucose, Bld 105 (H) 65 - 99 mg/dL   BUN 28 (H) 6 - 20 mg/dL   Creatinine, Ser 1.31 (H) 0.44 - 1.00 mg/dL   Calcium 9.6 8.9 - 10.3 mg/dL   GFR calc non Af Amer 42 (L) >60 mL/min   GFR calc Af Amer 49 (L) >60 mL/min   Anion gap 11 5 - 15  CBC     Status: Abnormal   Collection Time: 07/19/17 12:44 PM  Result Value Ref Range   WBC 11.2 (H) 3.6 - 11.0 K/uL   RBC 4.53 3.80 - 5.20 MIL/uL   Hemoglobin 14.1 12.0 - 16.0 g/dL   HCT 40.6 35.0 - 47.0 %   MCV 89.7 80.0 - 100.0 fL   MCH 31.1 26.0 - 34.0 pg   MCHC 34.7 32.0 - 36.0 g/dL   RDW 14.2 11.5 - 14.5 %   Platelets 258 150 - 440 K/uL  Troponin I     Status: Abnormal   Collection Time: 07/19/17 12:44 PM  Result Value Ref Range   Troponin I 0.04 (HH) <0.03 ng/mL   ____________________________________________  EKG My review and personal interpretation at Time: 12:39   Indication: chest pain  Rate: 80  Rhythm: sinus Axis: normal Other: normal intervals,  No stemi, no wpw ____________________________________________  RADIOLOGY  I personally reviewed all radiographic images ordered to evaluate for the above acute complaints and reviewed radiology reports and findings.   These findings were personally discussed with the patient.  Please see medical record for radiology report.  ____________________________________________   PROCEDURES  Procedure(s) performed:  Procedures    Critical Care performed: no ____________________________________________   INITIAL IMPRESSION / ASSESSMENT AND PLAN / ED COURSE  Pertinent labs & imaging results that were available during my care of the patient were reviewed by me and considered in my medical decision making (see chart for details).  DDX: ACS, pericarditis, esophagitis, boerhaaves, pe, dissection, pna, bronchitis, costochondritis   Tracy James is a 66 y.o. who presents to the ED with Chief complaint of waxing and waning intermittent chest pain radiating through to her back for the past 2 weeks. Patient afebrile hemodynamic stable. EKG shows some nonspecific changes. No  evidence of acute ischemia. CT angiogram ordered to evaluate for dissection above differential shows diffuse atherosclerotic changes but no aneurysm or dissection. Do not feel this is clinically consistent with pulmonary embolism. Her abdominal exam is soft and benign. Patient does have mildly elevated renal dysfunction from previous as well as an elevated troponin of 0.04. Patient still with intermittent chest pain. May be secondary to hypertensive urgency as her blood pressure is now 200/90. Based on her high risk heart score of 5 and high-risk comorbidities I do feel the patient benefit from admission. Patient given aspirin as well as nitroglycerin.      ____________________________________________   FINAL CLINICAL IMPRESSION(S) / ED DIAGNOSES  Final diagnoses:  Chest pain, unspecified type  Elevated troponin I level  Hypertension, unspecified type      NEW MEDICATIONS STARTED DURING THIS VISIT:  New Prescriptions   No medications on file     Note:  This document was prepared using Dragon voice recognition software and may  include unintentional dictation errors.    Merlyn Lot, MD 07/19/17 [phone]

## 2017-07-20 ENCOUNTER — Ambulatory Visit: Payer: BC Managed Care – PPO

## 2017-07-20 NOTE — Telephone Encounter (Signed)
Pt admitted

## 2017-07-23 ENCOUNTER — Telehealth: Payer: Self-pay | Admitting: Family Medicine

## 2017-07-23 NOTE — Telephone Encounter (Signed)
I called patient.  She signed herself out AMA.  Did not wish to follow up with recommended studies.  Feels her problem has been entirely causes by abd gas. She tried to tell them that and they gave her nothing for it.  Told her EKG and CT Angio were fine so she saw no reason to stay and have further tests.  Says she still is having off/on mid sternal pain shooting up to the left.  Still thinks it is gas.  Made has appt to follow up with provider tomorrow

## 2017-07-23 NOTE — Telephone Encounter (Signed)
noted 

## 2017-07-24 ENCOUNTER — Encounter: Payer: Self-pay | Admitting: Family Medicine

## 2017-07-24 ENCOUNTER — Ambulatory Visit (INDEPENDENT_AMBULATORY_CARE_PROVIDER_SITE_OTHER): Payer: BC Managed Care – PPO | Admitting: Family Medicine

## 2017-07-24 VITALS — BP 146/62 | HR 96 | Temp 98.4°F | Resp 12 | Ht 67.0 in | Wt 182.0 lb

## 2017-07-24 DIAGNOSIS — R079 Chest pain, unspecified: Secondary | ICD-10-CM | POA: Diagnosis not present

## 2017-07-24 DIAGNOSIS — R7989 Other specified abnormal findings of blood chemistry: Secondary | ICD-10-CM

## 2017-07-24 DIAGNOSIS — R748 Abnormal levels of other serum enzymes: Secondary | ICD-10-CM

## 2017-07-24 DIAGNOSIS — N179 Acute kidney failure, unspecified: Secondary | ICD-10-CM | POA: Diagnosis not present

## 2017-07-24 DIAGNOSIS — I10 Essential (primary) hypertension: Secondary | ICD-10-CM | POA: Diagnosis not present

## 2017-07-24 DIAGNOSIS — R778 Other specified abnormalities of plasma proteins: Secondary | ICD-10-CM

## 2017-07-24 LAB — CBC WITH DIFFERENTIAL/PLATELET
BASOS PCT: 0 %
Basophils Absolute: 0 cells/uL (ref 0–200)
EOS ABS: 119 {cells}/uL (ref 15–500)
Eosinophils Relative: 1 %
HEMATOCRIT: 42.2 % (ref 35.0–45.0)
HEMOGLOBIN: 14.4 g/dL (ref 12.0–15.0)
LYMPHS ABS: 4046 {cells}/uL — AB (ref 850–3900)
Lymphocytes Relative: 34 %
MCH: 31.4 pg (ref 27.0–33.0)
MCHC: 34.1 g/dL (ref 32.0–36.0)
MCV: 92.1 fL (ref 80.0–100.0)
MONO ABS: 714 {cells}/uL (ref 200–950)
MPV: 9.9 fL (ref 7.5–12.5)
Monocytes Relative: 6 %
Neutro Abs: 7021 cells/uL (ref 1500–7800)
Neutrophils Relative %: 59 %
Platelets: 310 10*3/uL (ref 140–400)
RBC: 4.58 MIL/uL (ref 3.80–5.10)
RDW: 14.2 % (ref 11.0–15.0)
WBC: 11.9 10*3/uL — ABNORMAL HIGH (ref 3.8–10.8)

## 2017-07-24 LAB — BASIC METABOLIC PANEL
BUN: 22 mg/dL (ref 7–25)
CHLORIDE: 106 mmol/L (ref 98–110)
CO2: 23 mmol/L (ref 20–32)
CREATININE: 1.28 mg/dL — AB (ref 0.50–0.99)
Calcium: 9.7 mg/dL (ref 8.6–10.4)
GLUCOSE: 126 mg/dL — AB (ref 70–99)
POTASSIUM: 3.9 mmol/L (ref 3.5–5.3)
Sodium: 140 mmol/L (ref 135–146)

## 2017-07-24 LAB — TROPONIN I: Troponin I: 0.04 ng/mL (ref <=0.05)

## 2017-07-24 MED ORDER — NITROGLYCERIN 0.4 MG SL SUBL: 0.4000 mg | SUBLINGUAL_TABLET | SUBLINGUAL | 0 refills | Status: DC | PRN

## 2017-07-24 NOTE — Progress Notes (Signed)
   Subjective:    Patient ID: Tracy James, female    DOB: 09/05/1951, 66 y.o.   MRN: [id number]  Patient presents for ED F/U (chest pain- left AMA- reports that pain moves throughout chest)  Pt here to f/u ER visit for chest pain. Has had chest pain feels like stabbing cutting across chest that also radiates to middle of back for the past 2 weeks. Seen at [facility], CXR neg, CT angio neg, EKG ? Septal infarct  She took gas/indigestoin pills from health food store with minimal improvement  She continues to have the episodes of chest discomfort. States that occasionally she belches it does improve. She denies any nausea vomiting no diaphoresis she is still getting her hot flashes at nighttime.    Review Of Systems:  GEN- denies fatigue, fever, weight loss,weakness, recent illness HEENT- denies eye drainage, change in vision, nasal discharge, CVS-+chest pain, denies  palpitations RESP- denies SOB, cough, wheeze ABD- denies N/V, change in stools, abd pain GU- denies dysuria, hematuria, dribbling, incontinence MSK- denies joint pain, muscle aches, injury Neuro- denies headache, dizziness, syncope, seizure activity       Objective:    BP (!) 146/62   Pulse 96   Temp 98.4 F (36.9 C) (Oral)   Resp 12   Ht 5\' 7"  (1.702 m)   Wt 182 lb (82.6 kg)   SpO2 98%   BMI 28.51 kg/m  GEN- NAD, alert and oriented x3 HEENT- PERRL, EOMI, non injected sclera, pink conjunctiva, MMM, oropharynx clear Neck- Supple, no thyromegaly CVS- RRR, no murmur RESP-CTAB ABD-NABS,soft,NT,ND EXT- No edema Pulses- Radial, DP- 2+  EKG- NSR, no ST changes, few PAC       Assessment & Plan:      Problem List Items Addressed This Visit      Unprioritized   Essential hypertension, benign   Relevant Medications   nitroGLYCERIN (NITROSTAT) 0.4 MG SL tablet   Other Relevant Orders   Ambulatory referral to Cardiology   Chest pain - Primary    Positive troponin, EKG shows, pt declines admission for chest  pain concenrn for unastable angina, she has risk factors for CAD. Given nitroglyercin to have at home.  She declines going back to the emergency room despite ongoing pain. I will do repeat her troponin today discussed with her if this one is still positive highly recommend that she go back to the ER tonight for admission. If it is for some reason negative she is going to proceed with seeing the cardiologist as an outpatient.      Relevant Orders   Troponin I   CBC with Differential/Platelet   Basic metabolic panel   EKG 16-SAYT (Completed)   Ambulatory referral to Cardiology    Other Visit Diagnoses    AKI (acute kidney injury) ([place])       Creatinine elevated at 1.3, recheck today she is hydrated but may have worsening in setting of her HTN    Elevated troponin       Relevant Orders   Ambulatory referral to Cardiology      Note: This dictation was prepared with Dragon dictation along with smaller phrase technology. Any transcriptional errors that result from this process are unintentional.

## 2017-07-24 NOTE — Assessment & Plan Note (Signed)
Positive troponin, EKG shows, pt declines admission for chest pain concenrn for unastable angina, she has risk factors for CAD. Given nitroglyercin to have at home.  She declines going back to the emergency room despite ongoing pain. I will do repeat her troponin today discussed with her if this one is still positive highly recommend that she go back to the ER tonight for admission. If it is for some reason negative she is going to proceed with seeing the cardiologist as an outpatient.

## 2017-07-25 ENCOUNTER — Emergency Department (HOSPITAL_COMMUNITY): Payer: BC Managed Care – PPO

## 2017-07-25 ENCOUNTER — Emergency Department (HOSPITAL_COMMUNITY)
Admission: EM | Admit: 2017-07-25 | Discharge: 2017-07-25 | Disposition: A | Discharge status: Home / Self Care | Payer: BC Managed Care – PPO | Attending: Emergency Medicine | Admitting: Emergency Medicine

## 2017-07-25 ENCOUNTER — Encounter (HOSPITAL_COMMUNITY): Payer: Self-pay | Admitting: Emergency Medicine

## 2017-07-25 ENCOUNTER — Other Ambulatory Visit: Payer: Self-pay | Admitting: Cardiology

## 2017-07-25 ENCOUNTER — Other Ambulatory Visit: Payer: Self-pay

## 2017-07-25 ENCOUNTER — Telehealth: Payer: Self-pay | Admitting: Family Medicine

## 2017-07-25 DIAGNOSIS — R011 Cardiac murmur, unspecified: Secondary | ICD-10-CM | POA: Diagnosis not present

## 2017-07-25 DIAGNOSIS — Z79899 Other long term (current) drug therapy: Secondary | ICD-10-CM | POA: Insufficient documentation

## 2017-07-25 DIAGNOSIS — R072 Precordial pain: Secondary | ICD-10-CM | POA: Diagnosis not present

## 2017-07-25 DIAGNOSIS — R079 Chest pain, unspecified: Secondary | ICD-10-CM

## 2017-07-25 DIAGNOSIS — M549 Dorsalgia, unspecified: Secondary | ICD-10-CM | POA: Insufficient documentation

## 2017-07-25 DIAGNOSIS — I10 Essential (primary) hypertension: Secondary | ICD-10-CM | POA: Insufficient documentation

## 2017-07-25 DIAGNOSIS — Z87891 Personal history of nicotine dependence: Secondary | ICD-10-CM | POA: Insufficient documentation

## 2017-07-25 LAB — HEPATIC FUNCTION PANEL
ALBUMIN: 3.8 g/dL (ref 3.5–5.0)
ALT: 20 U/L (ref 14–54)
AST: 23 U/L (ref 15–41)
Alkaline Phosphatase: 50 U/L (ref 38–126)
BILIRUBIN INDIRECT: 0.9 mg/dL (ref 0.3–0.9)
Bilirubin, Direct: 0.2 mg/dL (ref 0.1–0.5)
TOTAL PROTEIN: 7.1 g/dL (ref 6.5–8.1)
Total Bilirubin: 1.1 mg/dL (ref 0.3–1.2)

## 2017-07-25 LAB — CBC
HEMATOCRIT: 41 % (ref 36.0–46.0)
HEMOGLOBIN: 13.9 g/dL (ref 12.0–15.0)
MCH: 30.3 pg (ref 26.0–34.0)
MCHC: 33.9 g/dL (ref 30.0–36.0)
MCV: 89.5 fL (ref 78.0–100.0)
Platelets: 294 10*3/uL (ref 150–400)
RBC: 4.58 MIL/uL (ref 3.87–5.11)
RDW: 14.2 % (ref 11.5–15.5)
WBC: 11.5 10*3/uL — AB (ref 4.0–10.5)

## 2017-07-25 LAB — BASIC METABOLIC PANEL
ANION GAP: 11 (ref 5–15)
BUN: 19 mg/dL (ref 6–20)
CO2: 20 mmol/L — ABNORMAL LOW (ref 22–32)
Calcium: 9.6 mg/dL (ref 8.9–10.3)
Chloride: 107 mmol/L (ref 101–111)
Creatinine, Ser: 1.23 mg/dL — ABNORMAL HIGH (ref 0.44–1.00)
GFR calc Af Amer: 53 mL/min — ABNORMAL LOW (ref >60)
GFR, EST NON AFRICAN AMERICAN: 45 mL/min — AB (ref >60)
GLUCOSE: 132 mg/dL — AB (ref 65–99)
POTASSIUM: 3.6 mmol/L (ref 3.5–5.1)
SODIUM: 138 mmol/L (ref 135–145)

## 2017-07-25 LAB — I-STAT TROPONIN, ED: Troponin i, poc: 0.02 ng/mL (ref 0.00–0.08)

## 2017-07-25 LAB — PROTIME-INR
INR: 0.99
PROTHROMBIN TIME: 13.1 s (ref 11.4–15.2)

## 2017-07-25 LAB — LIPASE, BLOOD: LIPASE: 22 U/L (ref 11–51)

## 2017-07-25 LAB — D-DIMER, QUANTITATIVE (NOT AT ARMC): D DIMER QUANT: 0.3 ug{FEU}/mL (ref 0.00–0.50)

## 2017-07-25 IMAGING — DX DG CHEST 2V
2 series · 2 of 2 positions shown · non-contrast
Comparison: Radiographs and CT [DATE]

CLINICAL DATA: Chest pain for 2 and half weeks.

EXAM:
CHEST  2 VIEW

[chest pa]
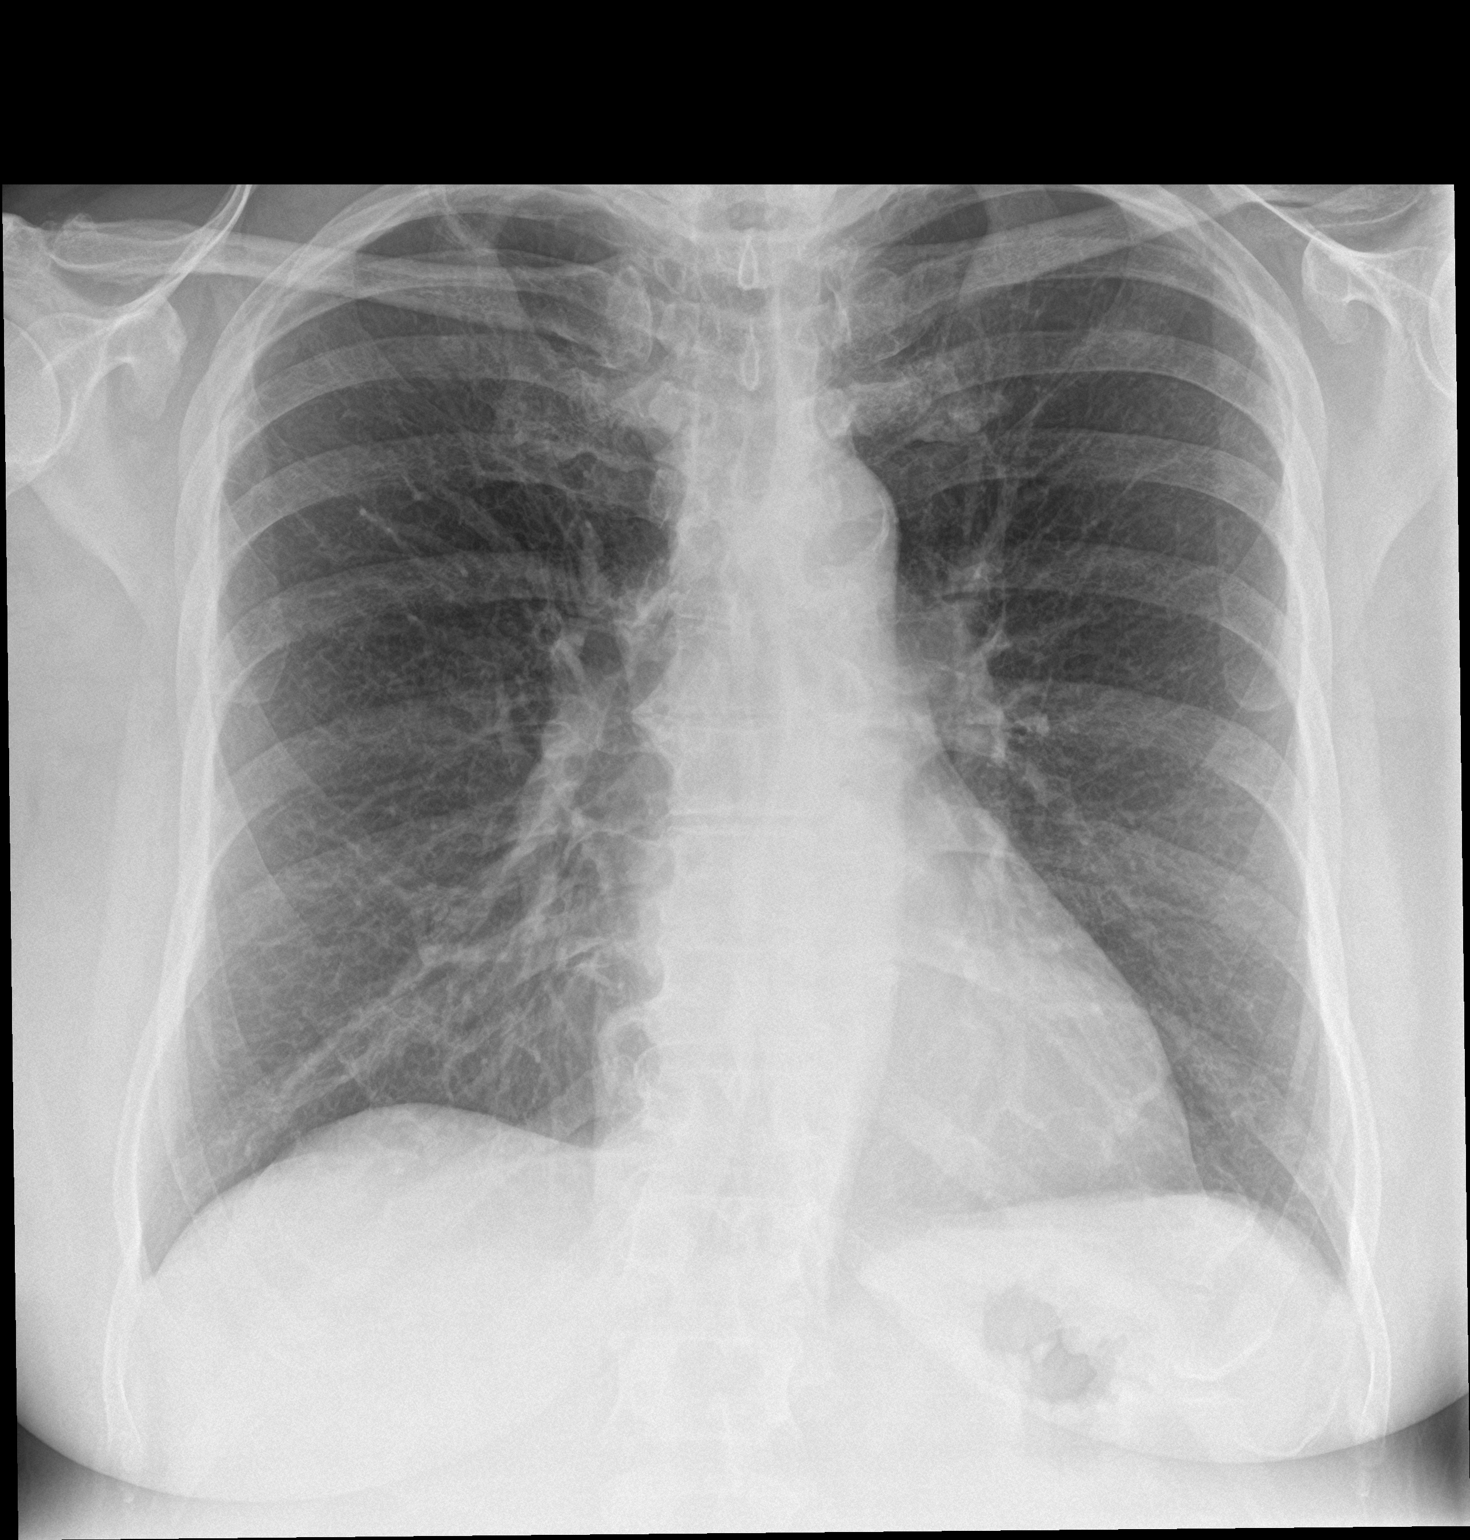

[chest lat]
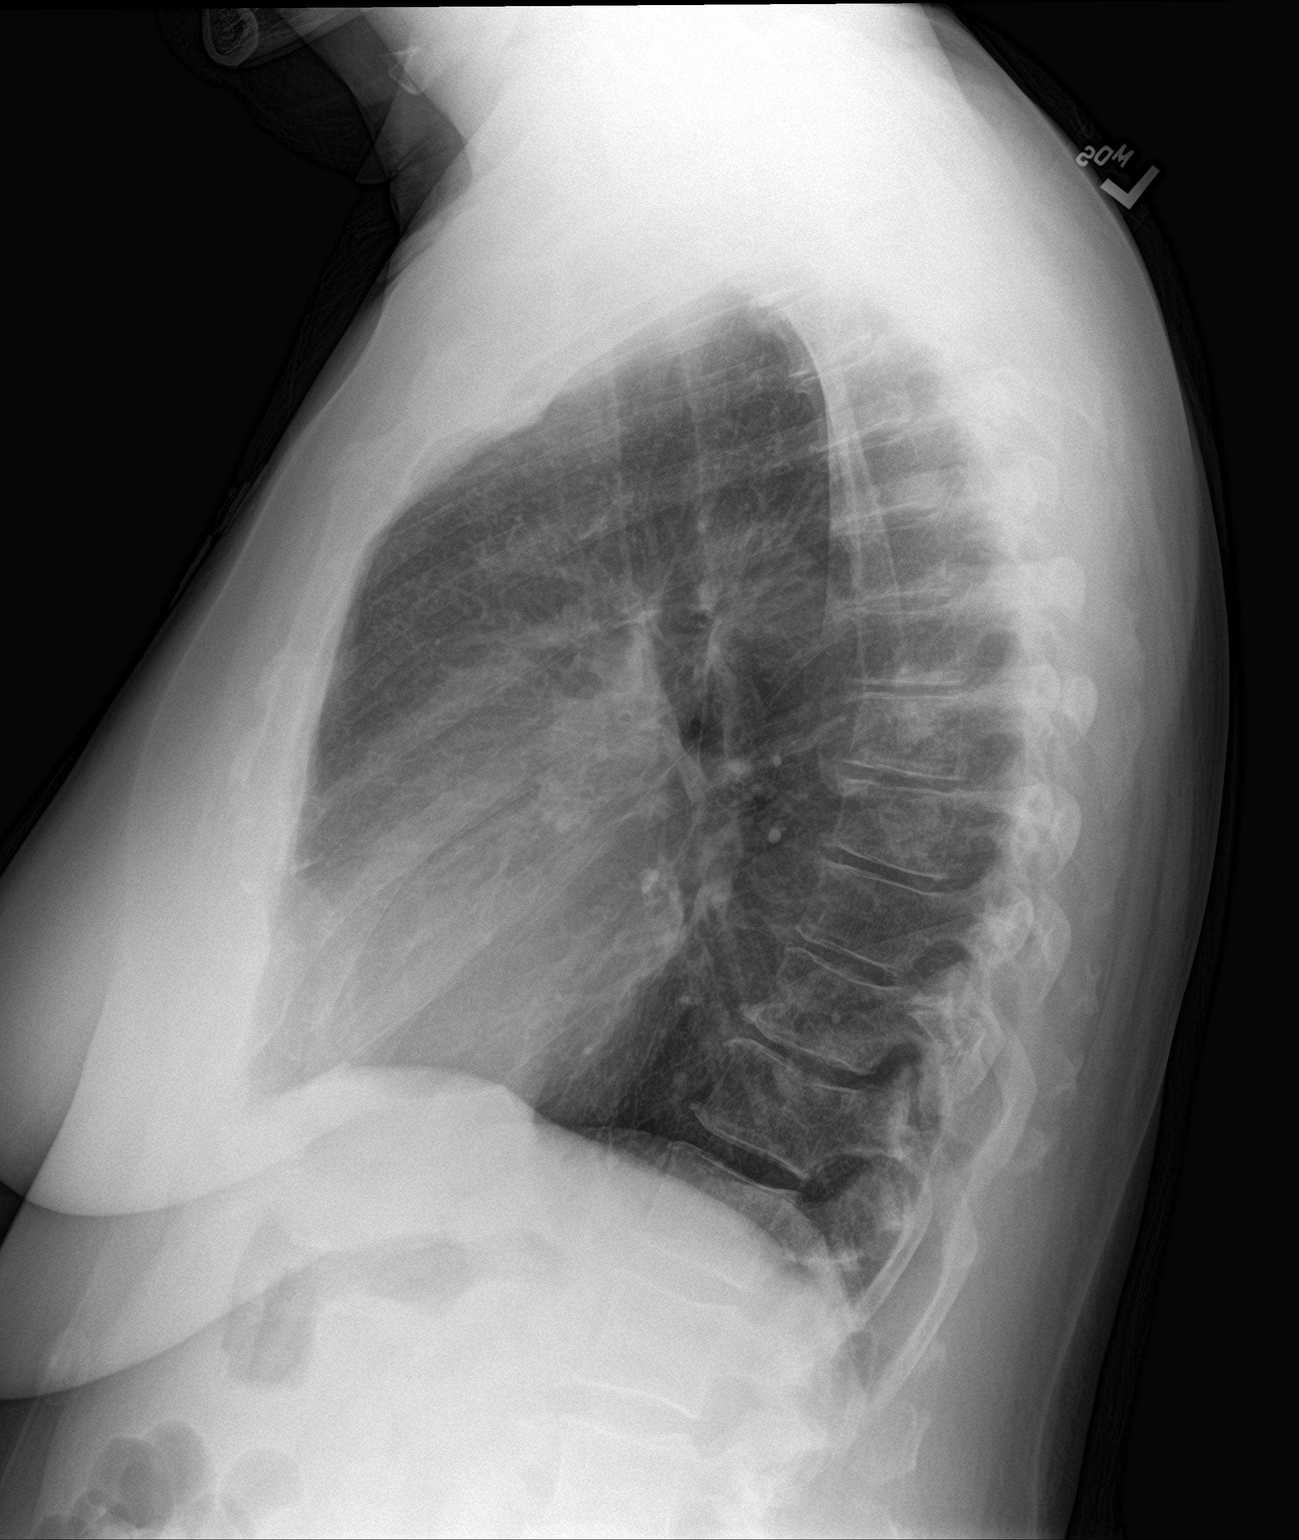

[2 of 2 positions shown; findings below may reference images not displayed]

FINDINGS: Mild hyperinflation, similar to prior exam. The cardiomediastinal
contours are normal. Mild atherosclerosis of the aortic arch.
Pulmonary vasculature is normal. No consolidation, pleural effusion,
or pneumothorax. No acute osseous abnormalities are seen.
IMPRESSION: Mild chronic hyperinflation without acute abnormality.

## 2017-07-25 IMAGING — CT CT HEART MORP W/ CTA COR W/ SCORE W/ CA W/CM &/OR W/O CM
4 of 7 series · 8 of 20 positions shown, 9 images · non-contrast
Comparison: Chest radiograph of earlier today. Diagnostic chest CT
of [DATE].

CLINICAL DATA: 66-year-old female with past medical history of
hypertension, tobacco use and heart murmur who presented to the ED
with complaints of ongoing chest pain.

EXAM:
Cardiac/Coronary  CT
TECHNIQUE: The patient was scanned on a [employer] scanner.

[Series 9: best diast 73 % · axial · 0.39mm/px · z∈[+245,+298]mm · 2 of 398 slices shown, 3 images]
[im 133/398  vessel]
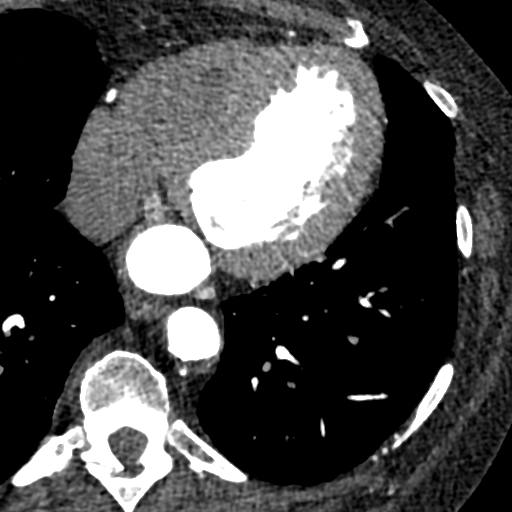
[im 133/398  lung]
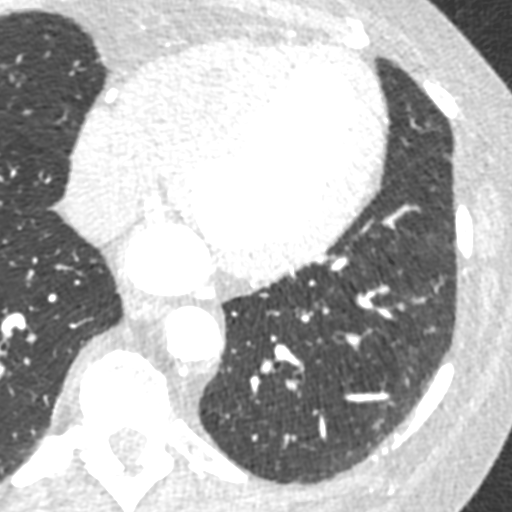
[im 265/398  vessel]
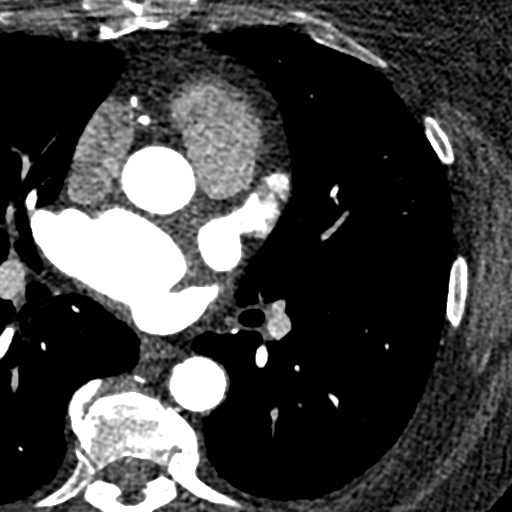

[Series 10: best syst 36 % · axial · 0.39mm/px · z∈[+245,+298]mm · 2 of 398 slices shown]
[im 133/398  vessel]
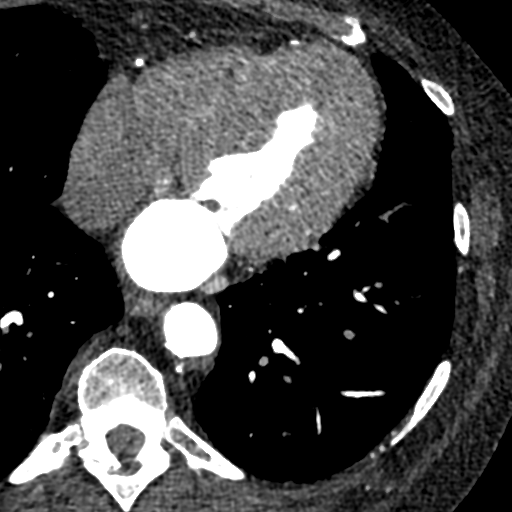
[im 265/398  vessel]
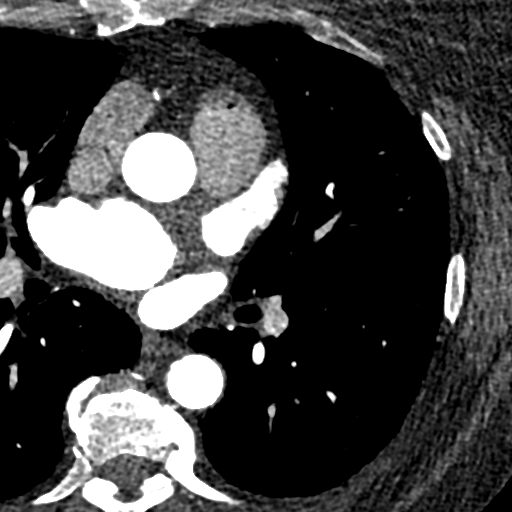

[Series 11: ts diast sharp 73 % · axial · 0.39mm/px · z∈[+245,+298]mm · 2 of 398 slices shown]
[im 133/398  lung]
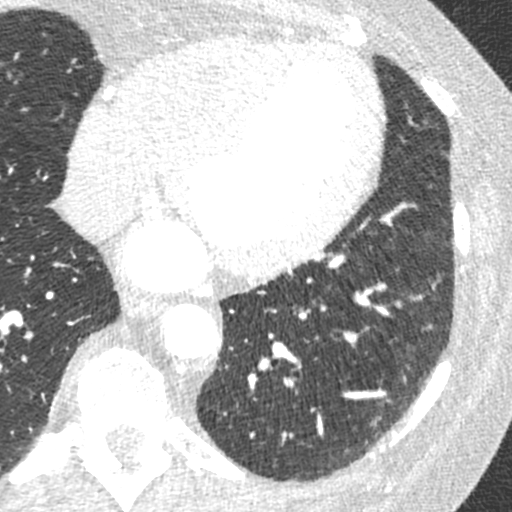
[im 265/398  lung]
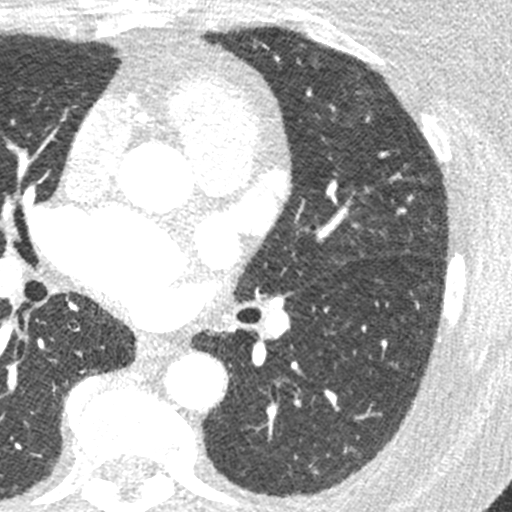

[Series 12: ts syst sharp 36 % · axial · 0.39mm/px · z∈[+245,+298]mm · 2 of 398 slices shown]
[im 133/398  lung]
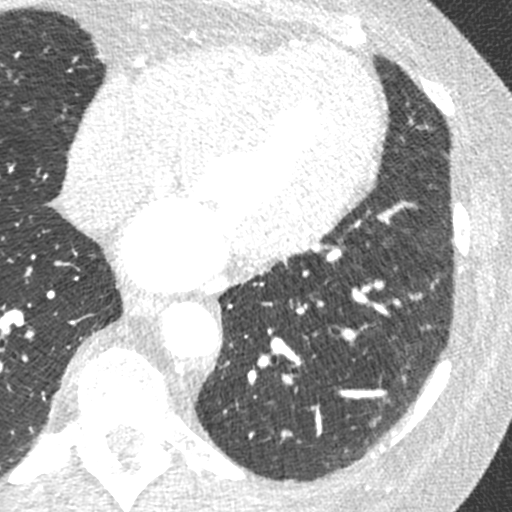
[im 265/398  lung]
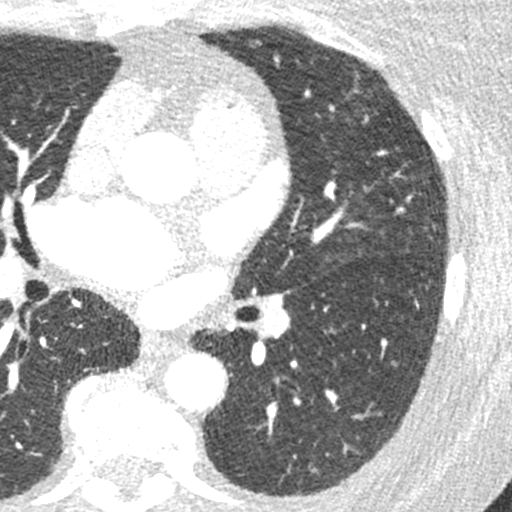

[8 of 20 positions shown; findings below may reference images not displayed]

FINDINGS: A 100 kV prospective scan was triggered in the descending thoracic
aorta at 111 HU's. Axial non-contrast 3 mm slices were carried out
through the heart. The data set was analyzed on a dedicated work
station and scored using the Agatson method. Gantry rotation speed
was 250 msecs and collimation was .5 mm. 5 mg of iv Metoprolol and
0.8 mg of sl NTG was given. The 3D data set was reconstructed in 5%
intervals of the 67-82 % of the R-R cycle. Diastolic phases were
analyzed on a dedicated work station using MPR, MIP and VRT modes.
The patient received 80 cc of contrast.

Aorta: Normal size. Mild diffuse calcifications in the aortic arch.
No dissection.

Aortic Valve: Trileaflet, unusually thickened leaflets and mild
calcifications in the non-coronary cusp. Leaflets opening can't be
evaluated as only diastolic images were obtained.

Coronary Arteries:  Normal coronary origin.  Right dominance.

RCA is a large dominant artery that gives rise to PDA and PLVB.
There is no plaque.

Left main is a large artery that gives rise to LAD and LCX arteries.

LAD is a large vessel that gives rise to two diagonal branches and
has no plaque.

LCX is a non-dominant artery that gives rise to one OM1 branch.
There is no plaque.

Other findings:

Normal pulmonary vein drainage into the left atrium.

Normal let atrial appendage without a thrombus.

Normal size of the pulmonary artery.
IMPRESSION: 1. Coronary calcium score of 0. This was 0 percentile for age and
sex matched control.

2. Normal coronary origin with right dominance.

3. No evidence of CAD.

4. Unusually thickened leaflets of the tricuspid aortic valve mild
calcifications in the non-coronary cusp. Leaflets opening can't be
evaluated as only diastolic images were obtained. An echocardiogram
is recommended for further evaluation.

MARVIN EDUARDO

EXAM:
OVER-READ INTERPRETATION  CT CHEST

The following report is an over-read performed by radiologist Dr.
does not include interpretation of cardiac or coronary anatomy or
pathology. The coronary CTA interpretation by the cardiologist is
attached.
FINDINGS: Vascular: Normal thoracic aortic caliber. Aortic atherosclerosis. No
aortic dissection. No central pulmonary embolism, on this
non-dedicated study.

Mediastinum/Nodes: 9 mm right paratracheal node is not pathologic by
size criteria. No hilar adenopathy.

Lungs/Pleura: No pleural fluid.  Clear imaged lungs.

Upper Abdomen: Normal imaged portions of the liver, spleen, stomach.

Musculoskeletal: No acute osseous abnormality. Mild thoracic
spondylosis.
IMPRESSION: 1. No acute findings in the extracardiac imaged chest.
2.  Aortic Atherosclerosis ([KU]-[KU]).

## 2017-07-25 MED ORDER — ASPIRIN 81 MG PO CHEW
324.0000 mg | CHEWABLE_TABLET | Freq: Once | ORAL | Status: AC
  Administered 2017-07-25: 324 mg via ORAL
  Filled 2017-07-25: qty 4

## 2017-07-25 MED ORDER — METOPROLOL TARTRATE 5 MG/5ML IV SOLN
INTRAVENOUS | Status: AC
  Filled 2017-07-25: qty 5

## 2017-07-25 MED ORDER — IOPAMIDOL (ISOVUE-370) INJECTION 76%
INTRAVENOUS | Status: AC
  Administered 2017-07-25: 100 mL
  Filled 2017-07-25: qty 100

## 2017-07-25 MED ORDER — NITROGLYCERIN 0.4 MG SL SUBL
0.4000 mg | SUBLINGUAL_TABLET | SUBLINGUAL | Status: DC | PRN
  Administered 2017-07-25 (×2): 0.4 mg via SUBLINGUAL
  Filled 2017-07-25: qty 1

## 2017-07-25 MED ORDER — NITROGLYCERIN 0.4 MG SL SUBL
0.8000 mg | SUBLINGUAL_TABLET | Freq: Once | SUBLINGUAL | Status: AC
  Administered 2017-07-25: 0.8 mg via SUBLINGUAL

## 2017-07-25 MED ORDER — NITROGLYCERIN 0.4 MG SL SUBL
SUBLINGUAL_TABLET | SUBLINGUAL | Status: AC
  Filled 2017-07-25: qty 2

## 2017-07-25 MED ORDER — METOPROLOL TARTRATE 25 MG PO TABS: 50.0000 mg | ORAL_TABLET | Freq: Once | ORAL | Status: DC

## 2017-07-25 MED ORDER — METOPROLOL TARTRATE 5 MG/5ML IV SOLN
5.0000 mg | Freq: Once | INTRAVENOUS | Status: AC
  Administered 2017-07-25: 5 mg via INTRAVENOUS

## 2017-07-25 NOTE — ED Notes (Signed)
Patient is back in bed hooked up to the monitor call bell in reach patient is resting

## 2017-07-25 NOTE — ED Provider Notes (Signed)
Patient is 66 year old female presenting with chest pain. Previous disposition was consultation cardiology.  Cardiology just reported that they would like to send patient home with negative coronary CT.  Patietn ambulatory, taking PO at time of discharge.   Will follow their recommendations.    Macarthur Critchley, MD 07/25/17 [phone]

## 2017-07-25 NOTE — ED Notes (Signed)
Patient transported to CT 

## 2017-07-25 NOTE — Consult Note (Addendum)
Consult Note    Patient ID: Tracy James MRN: [id number], DOB/AGE: 1951-01-02   Admit date: 07/25/2017  Primary Physician: Alycia Rossetti, MD Primary Cardiologist: New  Patient Profile    66 year old female with past medical history of hypertension, tobacco use and heart murmur who presented to the ED with complaints of ongoing chest pain.  Past Medical History   Past Medical History:  Diagnosis Date  . Allergy    SEASONAL  . Heart murmur   . Hypertension     No past surgical history on file.   Allergies  No Known Allergies  History of Present Illness    [name] is a 66 year old female with past medical history of hypertension, tobacco use and heart murmur. She denies having been dilated by cardiology in the past. States family history of heart disease with her maternal grandmother having multiple MIs. No CAD with her mother, father or siblings, only reported heart murmurs. She reports she is very active, and goes to water aerobics multiple days a week. About 3 weeks ago she began to experience a pressure/sharp sensation that radiated to the left and right sides of her chest. Felt this was GI related, and has attempted to treat with over-the-counter antacids without much relief. States she routinely cuts her grass on Mondays, and has noted in the evenings the pain seems a more significant. Goes to water aerobics on Tuesdays, and has continued to have this pressure/stabbing like sensation all across her chest. Was recently seen in the ER at [facility] on 8/16 his reported symptoms, and recommended admission with further workup. Troponin the air was 0.04. EKG there showed sinus rhythm with possible septal infarct. Patient reports she was nervous and decided to sign out AMA, feeling that this was all related to gas. Symptoms persisted, and she proceeded to follow-up phone call from her PCP office regarding her recent visit to the ER. At her appointment follow-up lab work  was done, and troponin showed 0.04 again. She was given sublingual nitroglycerin at this appt and given ER precautions.   Reports her symptoms have continued and have been keeping her awake at night as she has been too uncomfortable to sleep. Pain tends to radiate to the left side of her chest, and the right as well and into her back, as well into her left shoulder. Denies any significant dyspnea, or nausea or vomiting. States she took a sublingual nitroglycerin last night without much improvement, but woke up again around midnight and took a second without relief. Then presented to the ER for further evaluation.  In the ED her labs showed stable electrolytes, creatinine 1.2, hemoglobin 13.9, troponin 0.02. KG shows sinus rhythm with PACs and PVCs. Chest x-ray was negative for acute findings.  Home Medications    Prior to Admission medications   Medication Sig Start Date End Date Taking? Authorizing Provider  Calcium 600-200 MG-UNIT tablet Take 2 tablets by mouth daily. Patient taking differently: Take 1 tablet by mouth daily.  09/22/16  Yes [place], Modena Nunnery, MD  Capsicum, [place], (CAYENNE PO) Take 1 tablet by mouth daily.    Yes [provider]  hydrochlorothiazide (HYDRODIURIL) 25 MG tablet TAKE ONE TABLET BY MOUTH ONCE DAILY 09/27/16  Yes [place], Modena Nunnery, MD  nitroGLYCERIN (NITROSTAT) 0.4 MG SL tablet Place 1 tablet (0.4 mg total) under the tongue every 5 (five) minutes as needed for chest pain. 07/24/17  Yes [place], Modena Nunnery, MD  Omega-3 1000 MG CAPS Take 1 capsule  by mouth daily.    Yes [provider]  Turmeric 500 MG TABS Take 1 tablet by mouth daily.    Yes [provider]    Family History    Family History  Problem Relation Age of Onset  . Arthritis Mother   . Cancer Mother        colon cancer  . Diabetes Mother   . Hypertension Mother   . Cancer Brother        LUNG- smoking, [place]  . Early death Brother   . Arthritis Maternal Grandmother   .  Heart disease Maternal Grandmother        massive heart attack  . Heart disease Paternal Grandmother   . Asthma Brother   . Diabetes Brother   . Hypertension Brother   . Heart disease Father        anuersym- ruptered    Social History    Social History   Social History  . Marital status: Divorced    Spouse name: N/A  . Number of children: N/A  . Years of education: N/A   Occupational History  . Not on file.   Social History Main Topics  . Smoking status: Former Smoker    Quit date: 12/05/2011  . Smokeless tobacco: Never Used  . Alcohol use Yes     Comment: occasionally  . Drug use: No  . Sexual activity: Not Currently    Birth control/ protection: None   Other Topics Concern  . Not on file   Social History Narrative  . No narrative on file     Review of Systems    See HPI All other systems reviewed and are otherwise negative except as noted above.  Physical Exam    Blood pressure (!) 162/74, pulse 62, temperature 98.1 F (36.7 C), temperature source Oral, resp. rate 18, SpO2 100 %.  General: Pleasant, older AAF NAD Psych: Normal affect. Neuro: Alert and oriented X 3. Moves all extremities spontaneously. HEENT: Normal  Neck: Supple without bruits or JVD. Lungs:  Resp regular and unlabored, CTA. Heart: RRR no s3, s4, 2/6 systolic murmurs. Abdomen: Soft, non-tender, non-distended, BS + x 4.  Extremities: No clubbing, cyanosis or edema. DP/PT/Radials 2+ and equal bilaterally.  Labs    Troponin [facility] of Care Test)  Recent Labs  07/25/17 0559  TROPIPOC 0.02    Recent Labs  07/24/17 1613  TROPONINI 0.04   Lab Results  Component Value Date   WBC 11.5 (H) 07/25/2017   HGB 13.9 07/25/2017   HCT 41.0 07/25/2017   MCV 89.5 07/25/2017   PLT 294 07/25/2017    Recent Labs Lab 07/25/17 0546 07/25/17 0831  NA 138  --   K 3.6  --   CL 107  --   CO2 20*  --   BUN 19  --   CREATININE 1.23*  --   CALCIUM 9.6  --   PROT  --  7.1  BILITOT  --  1.1   ALKPHOS  --  50  ALT  --  20  AST  --  23  GLUCOSE 132*  --    Lab Results  Component Value Date   CHOL 191 09/22/2016   HDL 38 (L) 09/22/2016   LDLCALC 101 09/22/2016   TRIG 262 (H) 09/22/2016   Lab Results  Component Value Date   DDIMER 0.30 07/25/2017     Radiology Studies    Dg Chest 2 View  Result Date: 07/25/2017 CLINICAL DATA:  Chest pain for 2 and half weeks. EXAM: CHEST  2 VIEW COMPARISON:  Radiographs and CT 07/19/2017 FINDINGS: Mild hyperinflation, similar to prior exam. The cardiomediastinal contours are normal. Mild atherosclerosis of the aortic arch. Pulmonary vasculature is normal. No consolidation, pleural effusion, or pneumothorax. No acute osseous abnormalities are seen. IMPRESSION: Mild chronic hyperinflation without acute abnormality. Electronically Signed   By: Jeb Levering M.D.   On: 07/25/2017 06:05   Dg Chest 2 View  Result Date: 07/19/2017 CLINICAL DATA:  Back pain for 2 weeks EXAM: CHEST  2 VIEW COMPARISON:  09/06/2005 FINDINGS: The heart size and mediastinal contours are within normal limits. Both lungs are clear. The visualized skeletal structures are unremarkable. IMPRESSION: No active cardiopulmonary disease. Electronically Signed   By: Inez Catalina M.D.   On: 07/19/2017 13:05   Ct Angio Chest Aorta W And/or Wo Contrast  Result Date: 07/19/2017 CLINICAL DATA:  Chest pain EXAM: CT ANGIOGRAPHY CHEST WITH CONTRAST TECHNIQUE: Initially, axial CT images were obtained through the chest without intravenous contrast maternal administration. Multidetector CT imaging of the chest was performed using the standard protocol during bolus administration of intravenous contrast. Multiplanar CT image reconstructions and MIPs were obtained to evaluate the vascular anatomy. CONTRAST:  75 mL Isovue 370 nonionic COMPARISON:  Chest radiograph July 19, 2017 FINDINGS: Cardiovascular: There is no intramural hematoma evident on noncontrast enhanced study. There is no  appreciable thoracic aortic aneurysm or dissection. There is atherosclerotic calcification in the proximal great vessels without hemodynamically significant obstruction. There is atherosclerotic calcification in the aorta with mild plaque in the arch region. There is a small amount of calcification in the aortic valve. Pericardium is not appreciably thickened. No evident pulmonary embolus. Mediastinum/Nodes: Left lobe of thyroid is larger than right lobe of thyroid. There is no dominant thyroid lesion. Slight inhomogeneity in the left lobe may indicate mild multinodular goiter. There are occasional subcentimeter mediastinal lymph nodes. There is a lymph node to the right of the distal trachea anteriorly measuring 1.3 x 1.0 cm. No esophageal lesions are evident. Prominence Lungs/Pleura: There is a degree of underlying centrilobular emphysematous change. There are small bullae along the periphery of each upper lobe. There is mild bibasilar atelectatic change. There is no edema or consolidation. No pleural effusion or pleural thickening. Upper Abdomen: There is atherosclerotic calcification in the aorta and proximal left renal artery. Visualized upper abdominal structures otherwise appear unremarkable. Musculoskeletal: There are foci of degenerative change in the thoracic spine. There are no blastic or lytic bone lesions. There is also degenerative change in the sternomanubrial joint. Review of the MIP images confirms the above findings. IMPRESSION: 1. No thoracic aortic aneurysm or dissection. There are foci of calcification in the thoracic aorta and great vessels. There is mild calcification in the aortic valve. No evident pulmonary embolus. 2. There is a degree of centrilobular emphysematous change. No edema or consolidation. There is bibasilar atelectasis. 3. Single mildly prominent pretracheal lymph node, likely reactive in etiology. Aortic Atherosclerosis (ICD10-I70.0) and Emphysema (ICD10-J43.9). Electronically  Signed   By: Lowella Grip III M.D.   On: 07/19/2017 14:51    ECG & Cardiac Imaging    EKG: SR with PVC, PACs  Assessment & Plan    66 year old female with past medical history of hypertension, tobacco use and heart murmur who presented to the ED with complaints of ongoing chest pain.  1. Chest pain: Reports several weeks of chest pressure/sharp pain. States she felt this was GI related, and attempted to treat  as gas with OTC antiacids. Symptoms have persisted, and was seen at [facility] with recommendations for admission/work up. She left AMA, was then seen in MD office with continued symptoms. Now presents to the ED. Trop 0.04>>0.04 at [facility] and in the office, 0.02 today. CRFs with long hx of tobacco use and HTN. Discussed with Dr. Burt Knack and will plan for Coronary CT today. If normal then ok for discharge home.   2. HTN: Elevated in the ED  -- give metoprolol 50mg  PO now for Coronary CT.    3. Heart Murmur: States her PCP first noticed this about 5 or so years ago. Had an echo at that time. Asymptomatic by report.   Barnet Pall, NP-C Pager [phone] 07/25/2017, 2:48 PM  Patient seen, examined. Available data reviewed. Agree with findings, assessment, and plan as outlined by Reino Bellis, NP-C. My exam today: Vitals:   07/25/17 1530 07/25/17 1546  BP: (!) 162/74   Pulse:  63  Resp: 17 (!) 21  Temp:    SpO2:  100%   Pt is alert and oriented, NAD HEENT: normal Neck: JVP - normal, carotids 2+= without bruits Lungs: CTA bilaterally CV: RRR with 2/6 SEM at the RUSB Abd: soft, NT, Positive BS, no hepatomegaly Ext: no C/C/E, distal pulses intact and equal Skin: warm/dry no rash  EKG shows normal sinus rhythm with PVCs. Troponin has been equivocal at 0.04. The patient's history is carefully assessed and she really has primarily atypical symptoms of resting upper chest discomfort that feels like "indigestion." She has not had an exertional component and in fact she  feels better when she is pushing a lawnmower or walking. However, she does have significant risk for coronary artery disease and her troponin is equivocal. She has atherosclerosis of the aorta seen on his CT scan. I think further evaluation for ischemic heart disease is indicated. I have elected to order a gated coronary CTA with FFR if needed. If her CTA is negative she could be discharged from the emergency room. Obviously if there are coronary obstructive lesions or other significant issues found on the CT study we will admit her for further evaluation and treatment.  Sherren Mocha, M.D. 07/25/2017 4:17 PM  Addendum: Discussed coronary CTA result with Dr. Meda Coffee. The patient has no coronary artery disease. The aortic valve is noted to be thickened and this may be the cause of her heart murmur. As above, will arrange an outpatient echo. I think the patient can be discharged from the emergency room and we will arrange outpatient cardiology follow-up.  Sherren Mocha 07/25/2017 4:57 PM

## 2017-07-25 NOTE — Telephone Encounter (Signed)
New Message  Pt voiced she was here yesterday but wanted to let the nurse and MD-Heber know she was in the ED.

## 2017-07-25 NOTE — Telephone Encounter (Signed)
MD aware

## 2017-07-25 NOTE — Discharge Instructions (Signed)
Please follow up as cardiology has advised.

## 2017-07-25 NOTE — ED Notes (Signed)
Patient transported to X-ray 

## 2017-07-25 NOTE — ED Provider Notes (Signed)
[facility] DEPT Provider Note   CSN: [id number] Arrival date & time: 07/25/17  0536     History   Chief Complaint Chief Complaint  Patient presents with  . Chest Pain    HPI Tracy James is a 66 y.o. female.  The history is provided by the patient and medical records.  Chest Pain   This is a new problem. The current episode started more than 1 week ago. The problem occurs constantly. The problem has not changed since onset.The pain is present in the substernal region. The pain is at a severity of 8/10. The pain is severe. The quality of the pain is described as heavy, sharp, stabbing and pressure-like. The pain radiates to the epigastrium and upper back. Duration of episode(s) is 3 weeks. Associated symptoms include back pain and irregular heartbeat. Pertinent negatives include no abdominal pain, no cough, no diaphoresis, no fever, no headaches, no lower extremity edema, no malaise/fatigue, no nausea, no near-syncope, no numbness, no palpitations, no shortness of breath and no vomiting. She has tried nitroglycerin for the symptoms. The treatment provided moderate relief.  Her family medical history is significant for CAD.    Past Medical History:  Diagnosis Date  . Allergy    SEASONAL  . Heart murmur   . Hypertension     Patient Active Problem List   Diagnosis Date Noted  . Chest pain 07/19/2017  . Borderline diabetic 09/22/2016  . Overweight (BMI 25.0-29.9) 09/22/2016  . Heart murmur 09/22/2016  . Hypertriglyceridemia 03/20/2016  . Essential hypertension, benign 09/21/2015    No past surgical history on file.  OB History    No data available       Home Medications    Prior to Admission medications   Medication Sig Start Date End Date Taking? Authorizing Provider  Calcium 600-200 MG-UNIT tablet Take 2 tablets by mouth daily. Patient taking differently: Take 1 tablet by mouth daily.  09/22/16  Yes [place], Modena Nunnery, MD  Capsicum, [place], (CAYENNE PO)  Take 1 tablet by mouth daily.    Yes [provider]  hydrochlorothiazide (HYDRODIURIL) 25 MG tablet TAKE ONE TABLET BY MOUTH ONCE DAILY 09/27/16  Yes [place], Modena Nunnery, MD  nitroGLYCERIN (NITROSTAT) 0.4 MG SL tablet Place 1 tablet (0.4 mg total) under the tongue every 5 (five) minutes as needed for chest pain. 07/24/17  Yes [place], Modena Nunnery, MD  Omega-3 1000 MG CAPS Take 1 capsule by mouth daily.    Yes [provider]  Turmeric 500 MG TABS Take 1 tablet by mouth daily.    Yes [provider]    Family History Family History  Problem Relation Age of Onset  . Arthritis Mother   . Cancer Mother        colon cancer  . Diabetes Mother   . Hypertension Mother   . Cancer Brother        LUNG- smoking, [place]  . Early death Brother   . Arthritis Maternal Grandmother   . Heart disease Maternal Grandmother        massive heart attack  . Heart disease Paternal Grandmother   . Asthma Brother   . Diabetes Brother   . Hypertension Brother   . Heart disease Father        anuersym- ruptered    Social History Social History  Substance Use Topics  . Smoking status: Former Smoker    Quit date: 12/05/2011  . Smokeless tobacco: Never Used  . Alcohol use Yes  Comment: occasionally     Allergies   Patient has no known allergies.   Review of Systems Review of Systems  Constitutional: Negative for chills, diaphoresis, fatigue, fever and malaise/fatigue.  HENT: Negative for congestion.   Respiratory: Negative for cough, chest tightness, shortness of breath and wheezing.   Cardiovascular: Positive for chest pain. Negative for palpitations and near-syncope.  Gastrointestinal: Negative for abdominal pain, diarrhea, nausea and vomiting.  Genitourinary: Negative for flank pain.  Musculoskeletal: Positive for back pain. Negative for neck pain and neck stiffness.  Skin: Negative for rash and wound.  Neurological: Negative for light-headedness, numbness and  headaches.  Psychiatric/Behavioral: Negative for agitation.  All other systems reviewed and are negative.    Physical Exam Updated Vital Signs BP (S) (!) 150/78 (BP Location: Left Arm)   Pulse 87   Temp 98.1 F (36.7 C) (Oral)   Resp 19   SpO2 96%   Physical Exam  Constitutional: She is oriented to person, place, and time. She appears well-developed and well-nourished. No distress.  HENT:  Head: Normocephalic.  Mouth/Throat: Oropharynx is clear and moist. No oropharyngeal exudate.  Eyes: Pupils are equal, round, and reactive to light. Conjunctivae and EOM are normal.  Neck: Normal range of motion.  Cardiovascular: Normal rate and intact distal pulses.   Murmur heard. Pulmonary/Chest: Effort normal. No stridor. She has no wheezes. She has no rales. She exhibits no tenderness.  Abdominal: Soft. There is no tenderness.  Musculoskeletal: She exhibits no tenderness.  Neurological: She is alert and oriented to person, place, and time. No sensory deficit. She exhibits normal muscle tone.  Skin: Capillary refill takes less than 2 seconds. No rash noted. She is not diaphoretic. No erythema.  Psychiatric: She has a normal mood and affect.  Nursing note and vitals reviewed.    ED Treatments / Results  Labs (all labs ordered are listed, but only abnormal results are displayed) Labs Reviewed  BASIC METABOLIC PANEL - Abnormal; Notable for the following:       Result Value   CO2 20 (*)    Glucose, Bld 132 (*)    Creatinine, Ser 1.23 (*)    GFR calc non Af Amer 45 (*)    GFR calc Af Amer 53 (*)    All other components within normal limits  CBC - Abnormal; Notable for the following:    WBC 11.5 (*)    All other components within normal limits  PROTIME-INR  D-DIMER, QUANTITATIVE (NOT AT [facility])  HEPATIC FUNCTION PANEL  LIPASE, BLOOD  I-STAT TROPONIN, ED    EKG  EKG Interpretation  Date/Time:  Wednesday July 25 2017 05:37:17 EDT Ventricular Rate:  100 PR Interval:    QRS  Duration: 62 QT Interval:  368 QTC Calculation: 474 R Axis:   71 Text Interpretation:  Atrial fibrillation with premature ventricular or aberrantly conducted complexes Nonspecific ST abnormality , probably digitalis effect Abnormal ECG Multiple PVC's When compared to prior, more PVC present, no STEMI Confirmed by Antony Blackbird [phone]) on 07/25/2017 6:58:58 AM       Radiology Dg Chest 2 View  Result Date: 07/25/2017 CLINICAL DATA:  Chest pain for 2 and half weeks. EXAM: CHEST  2 VIEW COMPARISON:  Radiographs and CT 07/19/2017 FINDINGS: Mild hyperinflation, similar to prior exam. The cardiomediastinal contours are normal. Mild atherosclerosis of the aortic arch. Pulmonary vasculature is normal. No consolidation, pleural effusion, or pneumothorax. No acute osseous abnormalities are seen. IMPRESSION: Mild chronic hyperinflation without acute abnormality. Electronically Signed  By: Jeb Levering M.D.   On: 07/25/2017 06:05   Ct Coronary Morph W/cta Cor W/score W/ca W/cm &/or Wo/cm  Addendum Date: 07/25/2017   ADDENDUM REPORT: 07/25/2017 17:09 CLINICAL DATA:  66 year old female with past medical history of hypertension, tobacco use and heart murmur who presented to the ED with complaints of ongoing chest pain. EXAM: Cardiac/Coronary  CT TECHNIQUE: The patient was scanned on a [employer]. FINDINGS: A 100 kV prospective scan was triggered in the descending thoracic aorta at 111 HU's. Axial non-contrast 3 mm slices were carried out through the heart. The data set was analyzed on a dedicated work station and scored using the [place]. Gantry rotation speed was 250 msecs and collimation was .5 mm. 5 mg of iv Metoprolol and 0.8 mg of sl NTG was given. The 3D data set was reconstructed in 5% intervals of the 67-82 % of the R-R cycle. Diastolic phases were analyzed on a dedicated work station using MPR, MIP and VRT modes. The patient received 80 cc of contrast. Aorta: Normal size. Mild  diffuse calcifications in the aortic arch. No dissection. Aortic Valve: Trileaflet, unusually thickened leaflets and mild calcifications in the non-coronary cusp. Leaflets opening can't be evaluated as only diastolic images were obtained. Coronary Arteries:  Normal coronary origin.  Right dominance. RCA is a large dominant artery that gives rise to PDA and PLVB. There is no plaque. Left main is a large artery that gives rise to LAD and LCX arteries. LAD is a large vessel that gives rise to two diagonal branches and has no plaque. LCX is a non-dominant artery that gives rise to one OM1 branch. There is no plaque. Other findings: Normal pulmonary vein drainage into the left atrium. Normal let atrial appendage without a thrombus. Normal size of the pulmonary artery. IMPRESSION: 1. Coronary calcium score of 0. This was 0 percentile for age and sex matched control. 2. Normal coronary origin with right dominance. 3. No evidence of CAD. 4. Unusually thickened leaflets of the tricuspid aortic valve mild calcifications in the non-coronary cusp. Leaflets opening can't be evaluated as only diastolic images were obtained. An echocardiogram is recommended for further evaluation. Ena Dawley Electronically Signed   By: Ena Dawley   On: 07/25/2017 17:09   Result Date: 07/25/2017 EXAM: OVER-READ INTERPRETATION  CT CHEST The following report is an over-read performed by radiologist Dr. Forest Gleason [facility] on 07/25/2017. This over-read does not include interpretation of cardiac or coronary anatomy or pathology. The coronary CTA interpretation by the cardiologist is attached. COMPARISON:  Chest radiograph of earlier today. Diagnostic chest CT of 07/19/2017. FINDINGS: Vascular: Normal thoracic aortic caliber. Aortic atherosclerosis. No aortic dissection. No central pulmonary embolism, on this non-dedicated study. Mediastinum/Nodes: 9 mm right paratracheal node is not pathologic by size criteria. No hilar  adenopathy. Lungs/Pleura: No pleural fluid.  Clear imaged lungs. Upper Abdomen: Normal imaged portions of the liver, spleen, stomach. Musculoskeletal: No acute osseous abnormality. Mild thoracic spondylosis. IMPRESSION: 1. No acute findings in the extracardiac imaged chest. 2.  Aortic Atherosclerosis (ICD10-I70.0). Electronically Signed: By: Abigail Miyamoto M.D. On: 07/25/2017 16:59    Procedures Procedures (including critical care time)  Medications Ordered in ED Medications  nitroGLYCERIN (NITROSTAT) SL tablet 0.4 mg (0.4 mg Sublingual Given 07/25/17 1105)  metoprolol tartrate (LOPRESSOR) tablet 50 mg (not administered)  aspirin chewable tablet 324 mg (324 mg Oral Given 07/25/17 0903)  iopamidol (ISOVUE-370) 76 % injection (100 mLs  Contrast Given 07/25/17 1601)  metoprolol  tartrate (LOPRESSOR) injection 5 mg (5 mg Intravenous Given 07/25/17 1626)  nitroGLYCERIN (NITROSTAT) SL tablet 0.8 mg (0.8 mg Sublingual Given 07/25/17 1626)     Initial Impression / Assessment and Plan / ED Course  I have reviewed the triage vital signs and the nursing notes.  Pertinent labs & imaging results that were available during my care of the patient were reviewed by me and considered in my medical decision making (see chart for details).     Tracy James is a 66 y.o. female with a past medical history significant for hypertension and borderline diabetes ho presents with continued chest pain. Patient says that last week, she presented to [facility] and had a workup for 2 weeks of chest pain. She describes it as sharp as well as a pressure-like pain in her central chest radiating around the side on the left to her back. According to documentation, patient had a positive troponin at that time as well as a calculus in heart score of 5. Patient was offered admission however before admission, patient left AGAINST MEDICAL ADVICE. Patient says that for the last 6 days, she has had continued chest pain that has some  typical and atypical features. She does report that it is pressure-like at times and has been constant. She says that she has had relief with nitroglycerin at home. Patient then called her PCP who told her to come to the emergency department as they were concerned about a cardiac chest pain. Patient denies shortness of breath, diaphoresis, nausea, lightheadedness. Patient does report occasional palpitations. She denies recent fevers, chills, conservation, diarrhea, or dysuria. She denies any recent traumatic injuries. Patient says that her pain is up to a 6 out of 10 in severity with the discomfort. She initially thought it was gas pain but due to the length of symptoms, she is concerned about be worse.  On exam, chest is nontender. Her pain was nonreproducible.Patient's lungs were clear to auscultation bilaterally. Abdomen was nontender. Patient has no lower stomach edema. Symmetric pulses in all extremities. No focal neurologic deficits.  Patient's heart score calculated as a 4 this facility at this time. Patient is hypertensive. Patient had EKG showing multiple PVCs. These are new. These likely correspond to her palpitation she is feeling.  D-dimer was negative. Lipase not elevated. Hepatic function panel unremarkable. Mild leukocytosis but no anemia. Creatinine 1.2 and somewhat prior. Patient's initial troponin negative.  Patient will be given nitroglycerin to see if her pain is improved again. Patient's pain improved after nitroglycerin.  Given her elevated heart score and her continued symptoms after recently positive troponin, etc. Patient will need admission for further management and cardiac chest pain workup.  Anticipate following cardiology recs.    Final Clinical Impressions(s) / ED Diagnoses   Final diagnoses:  Precordial chest pain  Chest pain  Precordial pain    Clinical Impression: 1. Precordial chest pain   2. Chest pain   3. Precordial pain     Disposition: Admit to  Cardiology   Afsheen Antony, Gwenyth Allegra, MD 07/25/17 [phone]

## 2017-07-25 NOTE — ED Triage Notes (Addendum)
Pt states centralized chest pain radiating into back and shoulders for almost 3 weeks, was seen at [facility] and told she wanted to be admitted. Pt left. Went to MD office who told her she was "having a heart attack and discharged her with a prescription for nitro." Pt states she was going to see a heart specialist the next day. Pt states she has been awake since 0100 from the chest pain.  BP had to be taken manually.

## 2017-07-25 NOTE — ED Notes (Signed)
Walked patient to the bathroom patient did well 

## 2017-08-01 ENCOUNTER — Ambulatory Visit (HOSPITAL_COMMUNITY): Payer: BC Managed Care – PPO | Attending: Cardiology

## 2017-08-01 ENCOUNTER — Encounter (INDEPENDENT_AMBULATORY_CARE_PROVIDER_SITE_OTHER): Payer: Self-pay

## 2017-08-01 ENCOUNTER — Other Ambulatory Visit: Payer: Self-pay

## 2017-08-01 DIAGNOSIS — I119 Hypertensive heart disease without heart failure: Secondary | ICD-10-CM | POA: Insufficient documentation

## 2017-08-01 DIAGNOSIS — R011 Cardiac murmur, unspecified: Secondary | ICD-10-CM | POA: Diagnosis present

## 2017-08-01 DIAGNOSIS — R079 Chest pain, unspecified: Secondary | ICD-10-CM | POA: Insufficient documentation

## 2017-08-01 DIAGNOSIS — I350 Nonrheumatic aortic (valve) stenosis: Secondary | ICD-10-CM | POA: Insufficient documentation

## 2017-08-21 ENCOUNTER — Ambulatory Visit (INDEPENDENT_AMBULATORY_CARE_PROVIDER_SITE_OTHER): Payer: BC Managed Care – PPO | Admitting: Physician Assistant

## 2017-08-21 ENCOUNTER — Encounter: Payer: Self-pay | Admitting: Physician Assistant

## 2017-08-21 VITALS — BP 160/70 | HR 76 | Ht 67.0 in | Wt 181.8 lb

## 2017-08-21 DIAGNOSIS — I10 Essential (primary) hypertension: Secondary | ICD-10-CM

## 2017-08-21 DIAGNOSIS — I350 Nonrheumatic aortic (valve) stenosis: Secondary | ICD-10-CM | POA: Diagnosis not present

## 2017-08-21 DIAGNOSIS — R072 Precordial pain: Secondary | ICD-10-CM

## 2017-08-21 MED ORDER — LOSARTAN POTASSIUM 25 MG PO TABS: 25.0000 mg | ORAL_TABLET | Freq: Every day | ORAL | 3 refills | Status: DC

## 2017-08-21 NOTE — Patient Instructions (Addendum)
Medication Instructions:  1. START LOSARTAN 25 MG 1 TABLET DAILY; RX HAS BEEN SENT IN  Labwork: BMET TO BE DONE IN 1 WEEK  Testing/Procedures: Your physician has requested that you have an echocardiogram. Echocardiography is a painless test that uses sound waves to create images of your heart. It provides your doctor with information about the size and shape of your heart and how well your heart's chambers and valves are working. This procedure takes approximately one hour. There are no restrictions for this procedure. THIS IS TO BE DONE IN 1 YEAR. OUR OFFICE WILL CALL YOU TO SCHEDULE.     Follow-Up: Your physician wants you to follow-up in: 6 MONTHS WITH DR. Emelda Fear will receive a reminder letter in the mail two months in advance. If you don't receive a letter, please call our office to schedule the follow-up appointment.   Any Other Special Instructions Will Be Listed Below (If Applicable).   CALL IF YOUR BLOOD PRESSURE IS 140/90 OR HIGHER MOST OF THE TIME. [phone]  If you need a refill on your cardiac medications before your next appointment, please call your pharmacy.

## 2017-08-21 NOTE — Progress Notes (Signed)
Cardiology Office Note:    Date:  08/21/2017   ID:  Tracy James [birth year], MRN [id number]  PCP:  Tracy Rossetti, MD  Cardiologist:  Dr. Sherren James    Referring MD: Tracy Rossetti, MD   Chief Complaint  Patient presents with  . Hospitalization Follow-up    seen for chest pain  . Aortic Stenosis    History of Present Illness:    Tracy James is a 66 y.o. female with a hx of HTN, tobacco abuse.  The patient was evaluated by Dr. Sherren James in the ED on 07/25/17 for chest pain.  She had been seen in the [facility] ED 8/16 with recommendation for admission. However, she was nervous and left AMA.  At [facility], she had minimally elevated Troponin levels without clear trend.  Coronary CTA was performed and this demonstrated a Calcium score of 0 and no evidence of CAD.  The aortic valve was thickened and an OP echocardiogram was arranged.  This demonstrated an EF of 60-65.  Mean aortic valve gradient is 35 but the LVOT/AV ratio is 0.21.    Tracy James returns for follow-up. She is here alone. Since being seen in the emergency room, she denies recurrent chest discomfort. She has some upper thoracic discomfort that comes on with positional changes. She denies significant dyspnea. She denies orthopnea, PND or edema. She denies syncope.  Prior CV studies:   The following studies were reviewed today:  Echo 08/01/17 Mod LVH, EF 60-65, no RWMA, Gr 2 DD, mod AS (mean 35, peak 60), LVOT/AV velocity ratio 0.21, trivial MR, mild LAE  Coronary CTA 07/25/17 IMPRESSION: 1. Coronary calcium score of 0. This was 0 percentile for age and sex matched control. 2. Normal coronary origin with right dominance. 3. No evidence of CAD. 4. Unusually thickened leaflets of the tricuspid aortic valve mild calcifications in the non-coronary cusp. Leaflets opening can't be evaluated as only diastolic images were obtained. An echocardiogram is recommended for further evaluation.  Chest CTA  07/19/17 IMPRESSION: 1. No thoracic aortic aneurysm or dissection. There are foci of calcification in the thoracic aorta and great vessels. There is mild calcification in the aortic valve. No evident pulmonary embolus. 2. There is a degree of centrilobular emphysematous change. No edema or consolidation. There is bibasilar atelectasis. 3. Single mildly prominent pretracheal lymph node, likely reactive in etiology. Aortic Atherosclerosis (ICD10-I70.0) and Emphysema (ICD10-J43.9).  Carotid US 12/14 There is no evidence of a hemodynamically significant carotid stenosis.   Past Medical History:  Diagnosis Date  . Allergy    SEASONAL  . Heart murmur   . Hypertension     History reviewed. No pertinent surgical history.  Current Medications: Current Meds  Medication Sig  . Calcium 600-200 MG-UNIT tablet Take 1 tablet by mouth daily.  . Capsicum, [place], (CAYENNE PO) Take 1 tablet by mouth daily.   . hydrochlorothiazide (HYDRODIURIL) 25 MG tablet TAKE ONE TABLET BY MOUTH ONCE DAILY  . nitroGLYCERIN (NITROSTAT) 0.4 MG SL tablet Place 1 tablet (0.4 mg total) under the tongue every 5 (five) minutes as needed for chest pain.  . Omega-3 1000 MG CAPS Take 1 capsule by mouth daily.   . Turmeric 500 MG TABS Take 1 tablet by mouth daily.      Allergies:   Patient has no known allergies.   Social History   Social History  . Marital status: Divorced    Spouse name: N/A  . Number of children: N/A  . Years  of education: N/A   Social History Main Topics  . Smoking status: Former Smoker    Quit date: 12/05/2011  . Smokeless tobacco: Never Used  . Alcohol use Yes     Comment: occasionally  . Drug use: No  . Sexual activity: Not Currently    Birth control/ protection: None   Other Topics Concern  . None   Social History Narrative  . None     Family Hx: The patient's family history includes Arthritis in her maternal grandmother and mother; Asthma in her brother; Cancer in her brother  and mother; Diabetes in her brother and mother; Early death in her brother; Heart disease in her father, maternal grandmother, and paternal grandmother; Hypertension in her brother and mother.  ROS:   Please see the history of present illness.    ROS All other systems reviewed and are negative.   EKGs/Labs/Other Test Reviewed:    EKG:  EKG is  ordered today.  The ekg ordered today demonstrates NSR, HR 76, normal axis, PACs, nonspecific ST-T wave changes, QTc 438 ms  Recent Labs: 09/22/2016: TSH 1.42 07/25/2017: ALT 20; BUN 19; Creatinine, Ser 1.23; Hemoglobin 13.9; Platelets 294; Potassium 3.6; Sodium 138   Recent Lipid Panel Lab Results  Component Value Date/Time   CHOL 191 09/22/2016 08:29 AM   TRIG 262 (H) 09/22/2016 08:29 AM   HDL 38 (L) 09/22/2016 08:29 AM   CHOLHDL 5.0 09/22/2016 08:29 AM   LDLCALC 101 09/22/2016 08:29 AM    Physical Exam:    VS:  BP (!) 160/70   Pulse 76   Ht 5\' 7"  (1.702 m)   Wt 181 lb 12.8 oz (82.5 kg)   BMI 28.47 kg/m     Wt Readings from Last 3 Encounters:  08/21/17 181 lb 12.8 oz (82.5 kg)  07/24/17 182 lb (82.6 kg)  07/19/17 182 lb (82.6 kg)     Physical Exam  Constitutional: She is oriented to person, place, and time. She appears well-developed and well-nourished. No distress.  HENT:  Head: Normocephalic and atraumatic.  Eyes: No scleral icterus.  Neck: No JVD present.  Cardiovascular: Normal rate and regular rhythm.   Murmur heard.  Medium-pitched systolic murmur is present with a grade of 2/6  at the upper right sternal border Pulmonary/Chest: Effort normal. She has no rales.  Abdominal: Soft. There is no tenderness.  Musculoskeletal: She exhibits no edema.  Neurological: She is alert and oriented to person, place, and time.  Skin: Skin is warm and dry.  Psychiatric: She has a normal mood and affect.    ASSESSMENT:    1. Aortic valve stenosis, etiology of cardiac valve disease unspecified   2. Essential hypertension, benign    3. Precordial chest pain    PLAN:    In order of problems listed above:  1. Aortic valve stenosis, etiology of cardiac valve disease unspecified Recent echocardiogram with mean gradient 35 mmHg and LVOT/AV ratio 0.21. Mean gradient suggest moderate aortic stenosis. LVOT/AV ratio suggest severe aortic stenosis. She does not describe any symptoms that sound consistent with symptomatic severe aortic stenosis. I did review her echocardiogram today with Dr. Burt Knack over the telephone. We will plan on a repeat echocardiogram in one year. She will follow-up with Dr. Burt Knack or me in 6 months. We can obtain a follow up echocardiogram sooner if she develops symptoms.  2. Essential hypertension, benign BP above target.  She has a hx of cough that is likely related to COPD from prior smoking.  Continue HCTZ.  Start Losartan 25 mg QD.  BMET in 1 week. She will monitor her BP at home.   3. Precordial chest pain No recurrent chest pain.  Recent coronary CTA demonstrated no CAD and a Ca Score 0.  Dispo:  Return in about 6 months (around 02/18/2018) for Routine Follow Up, w/ Dr. Burt Knack, or Richardson Dopp, PA-C.   Medication Adjustments/Labs and Tests Ordered: Current medicines are reviewed at length with the patient today.  Concerns regarding medicines are outlined above.  Tests Ordered: Orders Placed This Encounter  Procedures  . Basic Metabolic Panel (BMET)  . EKG 12-Lead   Medication Changes: Meds ordered this encounter  Medications  . losartan (COZAAR) 25 MG tablet    Sig: Take 1 tablet (25 mg total) by mouth daily.    Dispense:  90 tablet    Refill:  3    Signed, Richardson Dopp, PA-C  08/21/2017 3:43 PM    [facility] [place], [place], [place]  [address] Phone: [phone]; Fax: [phone]

## 2017-08-29 ENCOUNTER — Other Ambulatory Visit: Payer: BC Managed Care – PPO | Admitting: *Deleted

## 2017-08-29 ENCOUNTER — Telehealth: Payer: Self-pay | Admitting: *Deleted

## 2017-08-29 DIAGNOSIS — I10 Essential (primary) hypertension: Secondary | ICD-10-CM

## 2017-08-29 LAB — BASIC METABOLIC PANEL
BUN/Creatinine Ratio: 19 (ref 12–28)
BUN: 19 mg/dL (ref 8–27)
CALCIUM: 9.4 mg/dL (ref 8.7–10.3)
CHLORIDE: 102 mmol/L (ref 96–106)
CO2: 22 mmol/L (ref 20–29)
Creatinine, Ser: 1.01 mg/dL — ABNORMAL HIGH (ref 0.57–1.00)
GFR calc Af Amer: 68 mL/min/{1.73_m2} (ref >59)
GFR calc non Af Amer: 59 mL/min/{1.73_m2} — ABNORMAL LOW (ref >59)
GLUCOSE: 108 mg/dL — AB (ref 65–99)
POTASSIUM: 4.9 mmol/L (ref 3.5–5.2)
Sodium: 138 mmol/L (ref 134–144)

## 2017-08-29 NOTE — Telephone Encounter (Signed)
-----   Message from Liliane Shi, [place] sent at 08/29/2017  4:58 PM EDT ----- Please call the patient. Kidney function is stable. All other parameters are within acceptable limits and no further intervention or testing required. Continue with current treatment plan. Richardson Dopp, PA-C   08/29/2017 4:58 PM

## 2017-08-29 NOTE — Telephone Encounter (Signed)
Left message to go over lab results.  

## 2017-08-30 NOTE — Telephone Encounter (Signed)
  Notes recorded by Liliane Shi, PA-C on 08/29/2017 at 4:58 PM EDT Please call the patient. Kidney function is stable. All other parameters are within acceptable limits and no further intervention or testing required. Continue with current treatment plan. Richardson Dopp, PA-C  08/29/2017 4:58 PM   Pt aware of lab results and recommendations per Richardson Dopp PA-C.  Pt verbalized understanding and agrees with this plan.

## 2017-08-30 NOTE — Telephone Encounter (Signed)
New Message ° ° pt verbalized that she is returning call for rn ° °For lab results  °

## 2017-09-26 ENCOUNTER — Ambulatory Visit (INDEPENDENT_AMBULATORY_CARE_PROVIDER_SITE_OTHER): Payer: BC Managed Care – PPO | Admitting: Family Medicine

## 2017-09-26 ENCOUNTER — Encounter: Payer: Self-pay | Admitting: Family Medicine

## 2017-09-26 VITALS — BP 178/80 | HR 78 | Temp 98.5°F | Resp 16 | Ht 67.0 in | Wt 175.0 lb

## 2017-09-26 DIAGNOSIS — E781 Pure hyperglyceridemia: Secondary | ICD-10-CM

## 2017-09-26 DIAGNOSIS — Z0000 Encounter for general adult medical examination without abnormal findings: Secondary | ICD-10-CM

## 2017-09-26 DIAGNOSIS — Z1231 Encounter for screening mammogram for malignant neoplasm of breast: Secondary | ICD-10-CM | POA: Diagnosis not present

## 2017-09-26 DIAGNOSIS — R7303 Prediabetes: Secondary | ICD-10-CM | POA: Diagnosis not present

## 2017-09-26 DIAGNOSIS — I10 Essential (primary) hypertension: Secondary | ICD-10-CM | POA: Diagnosis not present

## 2017-09-26 DIAGNOSIS — Z1211 Encounter for screening for malignant neoplasm of colon: Secondary | ICD-10-CM

## 2017-09-26 DIAGNOSIS — Z1239 Encounter for other screening for malignant neoplasm of breast: Secondary | ICD-10-CM

## 2017-09-26 MED ORDER — HYDROCHLOROTHIAZIDE 25 MG PO TABS: 25.0000 mg | ORAL_TABLET | Freq: Every day | ORAL | 3 refills | Status: DC

## 2017-09-26 NOTE — Assessment & Plan Note (Signed)
Restart HCTZ in addition to losartan Check BP at home F/U in office 6 weeks for BP recheck Continue diet and exercise

## 2017-09-26 NOTE — Patient Instructions (Addendum)
[facility] Restart HCTZ with the losartan  We will call with lab results  F/U 6 WEEKS   SCHEDULE MAMMOGRAM IN [place]  [phone]

## 2017-09-26 NOTE — Progress Notes (Signed)
   Subjective:    Patient ID: Tracy James, female    DOB: 17-Dec-1950, 66 y.o.   MRN: [id number]  Patient presents for CPE (is fasting)   Pt here for CPE   Fasting labs reviewed   Mammogram - UTD  COlonoscopy- UTD per patient PAP Smear UTD  Declines vaccines     HTN- taking losartan 25mg  once a day   She was seen by cardiology due to chest pain  Back in august. Has Aortic stenosis, following with Echo in 6 months She thought HCTZ was discontinued, and losartan started in its place BP has been high at home   Feels well, has been doing Whole 30 to intentionally lose weight, states no pains, bowels are good Walks daily and water aerobics once a week      Review Of Systems:  GEN- denies fatigue, fever, weight loss,weakness, recent illness HEENT- denies eye drainage, change in vision, nasal discharge, CVS- denies chest pain, palpitations RESP- denies SOB, cough, wheeze ABD- denies N/V, change in stools, abd pain GU- denies dysuria, hematuria, dribbling, incontinence MSK- denies joint pain, muscle aches, injury Neuro- denies headache, dizziness, syncope, seizure activity       Objective:    BP (!) 178/80 (BP Location: Left Arm, Patient Position: Sitting, Cuff Size: Normal)   Pulse 78   Temp 98.5 F (36.9 C) (Oral)   Resp 16   Ht 5\' 7"  (1.702 m)   Wt 175 lb (79.4 kg)   SpO2 100%   BMI 27.41 kg/m  GEN- NAD, alert and oriented x3 HEENT- PERRL, EOMI, non injected sclera, pink conjunctiva, MMM, oropharynx clear Neck- Supple, no thyromegaly CVS- RRR, 3/6 SEM radiates to carotids RESP-CTAB ABD-NABS,soft,NT,ND EXT- No edema Pulses- Radial, DP- 2+        Assessment & Plan:      Problem List Items Addressed This Visit      Unprioritized   Hypertriglyceridemia   Relevant Medications   hydrochlorothiazide (HYDRODIURIL) 25 MG tablet   Other Relevant Orders   Lipid panel   Borderline diabetic   Relevant Orders   Hemoglobin A1c   Essential hypertension, benign     Restart HCTZ in addition to losartan Check BP at home F/U in office 6 weeks for BP recheck Continue diet and exercise       Relevant Medications   hydrochlorothiazide (HYDRODIURIL) 25 MG tablet    Other Visit Diagnoses    Routine general medical examination at a health care facility    -  Primary   CPE done, fasting labs, with weight loss expect TG and blood sugar to be down significantly. Obtain colonoscopy report and set up when due . Mammo in Dec   Relevant Orders   CBC with Differential/Platelet   Comprehensive metabolic panel   Lipid panel   Colon cancer screening       Breast cancer screening       Relevant Orders   MM SCREENING BREAST TOMO BILATERAL      Note: This dictation was prepared with Dragon dictation along with smaller phrase technology. Any transcriptional errors that result from this process are unintentional.

## 2017-09-27 LAB — LIPID PANEL
CHOL/HDL RATIO: 3.2 (calc) (ref <5.0)
Cholesterol: 178 mg/dL (ref <200)
HDL: 55 mg/dL (ref >50)
LDL CHOLESTEROL (CALC): 95 mg/dL
NON-HDL CHOLESTEROL (CALC): 123 mg/dL (ref <130)
Triglycerides: 179 mg/dL — ABNORMAL HIGH (ref <150)

## 2017-09-27 LAB — HEMOGLOBIN A1C
EAG (MMOL/L): 6.2 (calc)
Hgb A1c MFr Bld: 5.5 % of total Hgb (ref <5.7)
Mean Plasma Glucose: 111 (calc)

## 2017-09-27 LAB — COMPREHENSIVE METABOLIC PANEL
AG Ratio: 1.3 (calc) (ref 1.0–2.5)
ALBUMIN MSPROF: 4 g/dL (ref 3.6–5.1)
ALT: 14 U/L (ref 6–29)
AST: 20 U/L (ref 10–35)
Alkaline phosphatase (APISO): 62 U/L (ref 33–130)
BUN / CREAT RATIO: 18 (calc) (ref 6–22)
BUN: 19 mg/dL (ref 7–25)
CHLORIDE: 103 mmol/L (ref 98–110)
CO2: 26 mmol/L (ref 20–32)
CREATININE: 1.03 mg/dL — AB (ref 0.50–0.99)
Calcium: 9.6 mg/dL (ref 8.6–10.4)
GLOBULIN: 3.1 g/dL (ref 1.9–3.7)
GLUCOSE: 72 mg/dL (ref 65–99)
POTASSIUM: 4.7 mmol/L (ref 3.5–5.3)
Sodium: 137 mmol/L (ref 135–146)
TOTAL PROTEIN: 7.1 g/dL (ref 6.1–8.1)
Total Bilirubin: 0.7 mg/dL (ref 0.2–1.2)

## 2017-09-27 LAB — CBC WITH DIFFERENTIAL/PLATELET
BASOS PCT: 0.6 %
Basophils Absolute: 42 cells/uL (ref 0–200)
EOS PCT: 2.6 %
Eosinophils Absolute: 182 cells/uL (ref 15–500)
HCT: 41.8 % (ref 35.0–45.0)
Hemoglobin: 14.1 g/dL (ref 11.7–15.5)
Lymphs Abs: 3297 cells/uL (ref 850–3900)
MCH: 30.3 pg (ref 27.0–33.0)
MCHC: 33.7 g/dL (ref 32.0–36.0)
MCV: 89.7 fL (ref 80.0–100.0)
MPV: 10.8 fL (ref 7.5–12.5)
Monocytes Relative: 7.9 %
Neutro Abs: 2926 cells/uL (ref 1500–7800)
Neutrophils Relative %: 41.8 %
PLATELETS: 274 10*3/uL (ref 140–400)
RBC: 4.66 10*6/uL (ref 3.80–5.10)
RDW: 13.4 % (ref 11.0–15.0)
TOTAL LYMPHOCYTE: 47.1 %
WBC: 7 10*3/uL (ref 3.8–10.8)
WBCMIX: 553 {cells}/uL (ref 200–950)

## 2017-10-04 ENCOUNTER — Encounter: Payer: Self-pay | Admitting: *Deleted

## 2017-11-20 ENCOUNTER — Ambulatory Visit
Admission: RE | Admit: 2017-11-20 | Discharge: 2017-11-20 | Disposition: A | Discharge status: Home / Self Care | Payer: BC Managed Care – PPO | Source: Ambulatory Visit | Attending: Family Medicine | Admitting: Family Medicine

## 2017-11-20 ENCOUNTER — Telehealth: Payer: Self-pay | Admitting: *Deleted

## 2017-11-20 DIAGNOSIS — Z1239 Encounter for other screening for malignant neoplasm of breast: Secondary | ICD-10-CM

## 2017-11-20 NOTE — Telephone Encounter (Signed)
Received call from patient.   Requested MD advise if she can take Omeprazole for gas.   MD please advise.

## 2017-11-20 NOTE — Telephone Encounter (Signed)
If just gas she can use Simethicone tablets and make sure bowels are moving normally If she has belching, bloating, heartburn use the omeprazole

## 2017-11-22 NOTE — Telephone Encounter (Signed)
Call placed to patient.   Patient states that she has not had any relief from trying Simethicone in the past. Reports that she has tried some of her cousin's Omeprazole and has noted relief. Advised that prescription can be sent to pharmacy, but medication may not be covered as it is OTC. States that she will by OTC.

## 2018-01-14 ENCOUNTER — Encounter: Payer: Self-pay | Admitting: Family Medicine

## 2018-02-25 ENCOUNTER — Encounter: Payer: Self-pay | Admitting: Family Medicine

## 2018-02-27 ENCOUNTER — Telehealth: Payer: Self-pay

## 2018-02-27 NOTE — Telephone Encounter (Signed)
Patient called the office about Losartan recall letter she received. I advised patient to call the pharmacy and see if her prescription was one of the manufacturers recalled. She will call the pharmacy and call back if needed.

## 2018-08-09 ENCOUNTER — Encounter: Payer: Self-pay | Admitting: Family Medicine

## 2018-08-09 ENCOUNTER — Ambulatory Visit: Payer: Medicare Other | Attending: Family Medicine | Admitting: Family Medicine

## 2018-08-09 VITALS — BP 189/80 | HR 82 | Temp 98.3°F | Ht 67.0 in | Wt 176.8 lb

## 2018-08-09 DIAGNOSIS — Z833 Family history of diabetes mellitus: Secondary | ICD-10-CM | POA: Insufficient documentation

## 2018-08-09 DIAGNOSIS — L03113 Cellulitis of right upper limb: Secondary | ICD-10-CM | POA: Insufficient documentation

## 2018-08-09 DIAGNOSIS — Z825 Family history of asthma and other chronic lower respiratory diseases: Secondary | ICD-10-CM | POA: Insufficient documentation

## 2018-08-09 DIAGNOSIS — R21 Rash and other nonspecific skin eruption: Secondary | ICD-10-CM

## 2018-08-09 DIAGNOSIS — I10 Essential (primary) hypertension: Secondary | ICD-10-CM | POA: Diagnosis not present

## 2018-08-09 DIAGNOSIS — Z79899 Other long term (current) drug therapy: Secondary | ICD-10-CM | POA: Insufficient documentation

## 2018-08-09 DIAGNOSIS — L03119 Cellulitis of unspecified part of limb: Secondary | ICD-10-CM | POA: Diagnosis not present

## 2018-08-09 DIAGNOSIS — R7303 Prediabetes: Secondary | ICD-10-CM | POA: Diagnosis not present

## 2018-08-09 DIAGNOSIS — Z87891 Personal history of nicotine dependence: Secondary | ICD-10-CM | POA: Insufficient documentation

## 2018-08-09 DIAGNOSIS — Z800 Family history of malignant neoplasm of digestive organs: Secondary | ICD-10-CM | POA: Insufficient documentation

## 2018-08-09 DIAGNOSIS — Z8249 Family history of ischemic heart disease and other diseases of the circulatory system: Secondary | ICD-10-CM | POA: Insufficient documentation

## 2018-08-09 DIAGNOSIS — Z801 Family history of malignant neoplasm of trachea, bronchus and lung: Secondary | ICD-10-CM | POA: Insufficient documentation

## 2018-08-09 DIAGNOSIS — Z8261 Family history of arthritis: Secondary | ICD-10-CM | POA: Insufficient documentation

## 2018-08-09 LAB — POCT GLYCOSYLATED HEMOGLOBIN (HGB A1C)
HbA1c POC (<> result, manual entry): 5.6 %
HbA1c, POC (controlled diabetic range): 5.6 % (ref 0.0–7.0)
HbA1c, POC (prediabetic range): 5.6 % — AB (ref 5.7–6.4)
Hemoglobin A1C: 5.6 % (ref 4.0–5.6)

## 2018-08-09 MED ORDER — CEPHALEXIN 500 MG PO CAPS: 500.0000 mg | ORAL_CAPSULE | Freq: Three times a day (TID) | ORAL | 0 refills | Status: AC

## 2018-08-09 MED ORDER — LOSARTAN POTASSIUM 50 MG PO TABS: 50.0000 mg | ORAL_TABLET | Freq: Every day | ORAL | 6 refills | Status: DC

## 2018-08-09 MED ORDER — HYDROCHLOROTHIAZIDE 25 MG PO TABS: 25.0000 mg | ORAL_TABLET | Freq: Every day | ORAL | 1 refills | Status: DC

## 2018-08-09 MED ORDER — SILVER SULFADIAZINE 1 % EX CREA: 1.0000 "application " | TOPICAL_CREAM | Freq: Every day | CUTANEOUS | 0 refills | Status: DC

## 2018-08-09 MED FILL — CEPHALEXIN 500 MG CAPSULE: 500 | 10 days supply | Qty: 30 | Fill #0

## 2018-08-09 MED FILL — SSD 1% CREAM: 1 | 30 days supply | Qty: 50 | Fill #0

## 2018-08-09 MED FILL — HYDROCHLOROTHIAZIDE 25 MG T: 25 | 30 days supply | Qty: 30 | Fill #0

## 2018-08-09 MED FILL — LOSARTAN POTASSIUM 50 MG TA: 50 | 30 days supply | Qty: 30 | Fill #0

## 2018-08-09 NOTE — Progress Notes (Signed)
PATIENT HAD A RASH ON RIGHT WRIST

## 2018-08-09 NOTE — Patient Instructions (Signed)

## 2018-08-09 NOTE — Progress Notes (Signed)
Subjective:    Patient ID: Tracy James, female    DOB: [birth year], 67 y.o.   MRN: [id number]  HPI       67 yo female new to the practice.  Patient with chief complaint at today's visit of a rash on her right wrist.  Patient states that the rash has been present for approximately 3 weeks.  Patient states that she was gardening about 3 weeks ago and later that night noticed some itching and redness on the back of her right wrist.  Patient did not feel herself get bitten by anything but patient states that it appeared as if there was a small puncture wound on her arm as if she had gotten bitten by an insect.  Patient states that she applied Betadine, alcohol and Neosporin to the wound.  Patient however states that the wound has gotten bigger and itchy and has now spread across her entire wrist.  Patient states that she showed her rash to her local pharmacist and states that she was told that if the skin around the wound becomes red that she needs to be seen by a doctor.  Patient states that the area scabbed over with the thick scab and she started soaking her wrist and a mixture of absence salt alcohol and peroxide and the scab loosened and came off earlier this week.  Patient states that the underlying skin is red and warm and patient decided to make an appointment.  Patient also states that the area is very itchy.      Patient reports a past medical history significant for hypertension.  Patient states that she did take her blood pressure medication this morning.  Patient denies headache or dizziness related to her blood pressure.  Patient states that she is also been diagnosed with a heart murmur. (On review of patient's chart, patient also with prior diagnosis of prediabetes and had a hemoglobin A1c last year that was elevated at 5.8.).  Patient reports a family history significant for her maternal grandmother dying from heart disease and her mother also had diabetes and hypertension.  Patient states  that her mom died from colon cancer at the age of 59.  Patient states that she is currently separated patient does not smoke but did smoke in the past.  Patient states that she quit smoking approximately 7 years ago and up until that point had smoked about half a pack a day of cigarettes.  Patient reports occasional intake of wine.  Patient denies any past surgery.  Past Medical History:  Diagnosis Date  . Allergy    SEASONAL  . Heart murmur   . Hypertension    Family History  Problem Relation Age of Onset  . Arthritis Mother   . Cancer Mother        colon cancer  . Diabetes Mother   . Hypertension Mother   . Cancer Brother        LUNG- smoking, Abestos  . Early death Brother   . Arthritis Maternal Grandmother   . Heart disease Maternal Grandmother        massive heart attack  . Heart disease Paternal Grandmother   . Asthma Brother   . Diabetes Brother   . Hypertension Brother   . Heart disease Father        anuersym- ruptered   Social History   Tobacco Use  . Smoking status: Former Smoker    Last attempt to quit: 12/05/2011    Years since quitting:  6.6  . Smokeless tobacco: Never Used  Substance Use Topics  . Alcohol use: Yes    Comment: occasionally  . Drug use: No  No Known Allergies    Review of Systems     Objective:   Physical Exam  BP (!) 189/80   Pulse 82   Temp 98.3 F (36.8 C) (Oral)   Ht 5\' 7"  (1.702 m)   Wt 176 lb 12.8 oz (80.2 kg)   BMI 27.69 kg/m  Vital signs and nurse's note reviewed General-well-nourished, well-developed older female in no acute distress Neck-supple, no lymphadenopathy, no thyromegaly, patient has noise in the bilateral carotids but this may be referred noise from heart murmur Lungs-clear to auscultation bilaterally Cardiovascular-regular rate and rhythm, holosystolic murmur Abdomen-soft, nontender Extremities-no edema Back-no CVA tenderness Skin- patient with a erythematous maculopapular rash on the dorsum of the right  wrist which extends across the entire dorsum and patient also with some small papules on either side of this rash.  There is central erythema of the rash and increased warmth around the area of the rash.  There appears to be a possible trigger area, darkened area in the middle of the rash.      Assessment & Plan:  1. Uncontrolled hypertension  Patient had her medication bottles with her today and states that she had taken hydrochlorothiazide 25 mg and losartan 25 mg earlier today prior to her visit.  Patient's blood pressure however was elevated at 189/80.  Patient's dose of Cozaar is being increased to 50 mg and patient will return to clinic in 1 week for recheck of blood pressure as well as follow-up of her rash. - hydrochlorothiazide (HYDRODIURIL) 25 MG tablet; Take 1 tablet (25 mg total) by mouth daily.  Dispense: 90 tablet; Refill: 1  2. Prediabetes Patient with hemoglobin A1c last year of 5.8 on review of chart and patient will have repeat hemoglobin A1c at today's visit.  Patient's hemoglobin A1c at today's visit was improved at 5.6 - HgB A1c  3. Cellulitis of wrist Patient with cellulitis of the right wrist status post contact dermatitis versus insect bite.  Patient will be placed on Keflex 500 mg 3 times daily for the next 10 days.  Patient was asked not to use hydrogen peroxide or alcohol which may further dry out the skin and damage skin cells.  Patient was asked to follow-up in 1 week for reevaluation but call or return sooner if increased redness, pain or any concerns. - cephALEXin (KEFLEX) 500 MG capsule; Take 1 capsule (500 mg total) by mouth 3 (three) times daily for 10 days.  Dispense: 30 capsule; Refill: 0  4. Rash and nonspecific skin eruption Patient with rash that may have originated as contact dermatitis or insect bite.  Patient's rash will be addressed at today's visit with Silvadene cream and dressing and she should change this once per day.  Discussed that Silvadene will  help prevent further infection and help with healing as well as hopefully provide some relief of the itching.  Patient may also take over-the-counter Benadryl as needed for itching.  Patient will follow-up in 1 week - silver sulfADIAZINE (SILVADENE) 1 % cream; Apply 1 application topically daily.  Dispense: 50 g; Refill: 0  *Patient was offered but declined influenza immunization at today's visit  An After Visit Summary was printed and given to the patient.  Return in about 1 week (around 08/16/2018) for cellulitis.

## 2018-08-16 ENCOUNTER — Other Ambulatory Visit: Payer: Self-pay

## 2018-08-16 ENCOUNTER — Encounter: Payer: Self-pay | Admitting: Family Medicine

## 2018-08-16 ENCOUNTER — Ambulatory Visit: Payer: Medicare Other | Attending: Family Medicine | Admitting: Family Medicine

## 2018-08-16 VITALS — BP 133/79 | HR 96 | Temp 97.4°F | Resp 18 | Ht 66.0 in | Wt 175.2 lb

## 2018-08-16 DIAGNOSIS — Z87891 Personal history of nicotine dependence: Secondary | ICD-10-CM | POA: Insufficient documentation

## 2018-08-16 DIAGNOSIS — I10 Essential (primary) hypertension: Secondary | ICD-10-CM

## 2018-08-16 DIAGNOSIS — Z833 Family history of diabetes mellitus: Secondary | ICD-10-CM | POA: Insufficient documentation

## 2018-08-16 DIAGNOSIS — R21 Rash and other nonspecific skin eruption: Secondary | ICD-10-CM | POA: Diagnosis not present

## 2018-08-16 DIAGNOSIS — Z8249 Family history of ischemic heart disease and other diseases of the circulatory system: Secondary | ICD-10-CM | POA: Insufficient documentation

## 2018-08-16 DIAGNOSIS — K521 Toxic gastroenteritis and colitis: Secondary | ICD-10-CM | POA: Diagnosis not present

## 2018-08-16 DIAGNOSIS — R197 Diarrhea, unspecified: Secondary | ICD-10-CM | POA: Insufficient documentation

## 2018-08-16 MED ORDER — LOPERAMIDE HCL 2 MG PO TABS: ORAL_TABLET | ORAL | 0 refills | Status: DC

## 2018-08-16 MED ORDER — HYDROCORTISONE 0.5 % EX CREA: 1.0000 "application " | TOPICAL_CREAM | Freq: Two times a day (BID) | CUTANEOUS | 0 refills | Status: DC

## 2018-08-16 MED ORDER — METRONIDAZOLE 500 MG PO TABS: 500.0000 mg | ORAL_TABLET | Freq: Two times a day (BID) | ORAL | 0 refills | Status: AC

## 2018-08-16 NOTE — Progress Notes (Signed)
Subjective:    Patient ID: Tracy James, female    DOB: [birth year], 67 y.o.   MRN: [id number]  HPI 67 year old female seen in follow-up of cellulitis/infected area of rash as well as uncontrolled hypertension.  Patient states that she has started the blood pressure medicine, Cozaar since her last visit.  Patient also continues to take her hydrochlorothiazide.  Patient states that she has been taking the antibiotic and her wrist where she has had the rash is no longer red, warm or tender.  Patient has had no drainage from the area.  Patient states that the area does continue to feel itchy.  Patient denies any fever or chills.  Patient states that last night she had onset of diarrhea which she believes is related to the antibiotic use.  Patient states that she has had several episodes of diarrhea both last night and this morning.  Diarrhea is nonbloody.  Patient has had no abdominal pain.  Patient denies any chest pain or shortness of breath.  Patient states that she did have one episode of diarrhea since arriving here at the office this morning.  Patient does feel fatigued.  Patient denies any muscle cramping. Past Medical History:  Diagnosis Date  . Allergy    SEASONAL  . Heart murmur   . Hypertension    Family History  Problem Relation Age of Onset  . Arthritis Mother   . Cancer Mother        colon cancer  . Diabetes Mother   . Hypertension Mother   . Cancer Brother        LUNG- smoking, Abestos  . Early death Brother   . Arthritis Maternal Grandmother   . Heart disease Maternal Grandmother        massive heart attack  . Heart disease Paternal Grandmother   . Asthma Brother   . Diabetes Brother   . Hypertension Brother   . Heart disease Father        anuersym- ruptered   Social History   Tobacco Use  . Smoking status: Former Smoker    Last attempt to quit: 12/05/2011    Years since quitting: 6.7  . Smokeless tobacco: Never Used  Substance Use Topics  . Alcohol use: Yes   Comment: occasionally  . Drug use: No  No Known Allergies   sReview of Systems  Constitutional: Positive for fatigue (Due to recent onset of diarrhea). Negative for chills and fever.  Respiratory: Negative for cough and shortness of breath.   Cardiovascular: Negative for chest pain, palpitations and leg swelling.  Gastrointestinal: Positive for diarrhea. Negative for abdominal pain and nausea.  Genitourinary: Negative for dysuria and frequency.  Musculoskeletal: Negative for arthralgias and back pain.  Neurological: Negative for dizziness and headaches.       Objective:   Physical Exam BP 133/79   Pulse 96   Temp (!) 97.4 F (36.3 C) (Oral)   Resp 18   Ht 5\' 6"  (1.676 m)   Wt 175 lb 3.2 oz (79.5 kg)   SpO2 99%   BMI 28.28 kg/m Vital signs and nurse's note reviewed General-well-nourished, well-developed older female in no acute distress Lungs-clear to auscultation bilaterally. Cardiovascular- regular rate and rhythm, holosystolic murmur Abdomen- bowel sounds are normal, abdomen is soft and nontender, mild abdominal distention Back-no CVA tenderness Extremities-no edema Skin- patient still with some mild skin irritation but no increased warmth, no drainage and no extension of prior rash      Assessment & Plan:  1. Essential hypertension Patient's blood pressures improved at today's visit with recent addition of Cozaar 50 mg to patient's current hydrochlorothiazide.  Patient will continue her current medications.  Unfortunately, blood pressure may also be artificially low if patient is having some early/mild dehydration secondary to diarrhea  2. Rash Patient with complaint of continued itching at the rash site.  Prescription provided for hydrocortisone cream to apply twice daily as needed.  Patient is to return to clinic next week if itching is not improved and also if her diarrhea has not improved - hydrocortisone cream 0.5 %; Apply 1 application topically 2 (two) times  daily. As needed for itching  Dispense: 30 g; Refill: 0  3. Diarrhea due to drug Patient has recently been on Keflex for an infected wound.  Patient now with recent onset diarrhea.  Due to concern of C. difficile.  Patient will be placed on metronidazole 500 mg twice daily.  Prescription also given for Imodium.  Patient was given education on diarrhea as part of AVS.  Patient was made aware that if her diarrhea is not improving or she feels as if she has become dehydrated over the weekend she should go to the emergency department otherwise she should follow-up next week if her diarrhea has not resolved - metroNIDAZOLE (FLAGYL) 500 MG tablet; Take 1 tablet (500 mg total) by mouth 2 (two) times daily for 7 days.  Dispense: 14 tablet; Refill: 0 - loperamide (LOPERAMIDE A-D) 2 MG tablet; Take 2 pills then one pill after any additional loose stools  Dispense: 30 tablet; Refill: 0  An After Visit Summary was printed and given to the patient.  Return in about 3 months (around 11/15/2018) for next week if not better otherwise 3 months.

## 2018-08-16 NOTE — Patient Instructions (Signed)
Diarrhea, Adult °Diarrhea is when you have loose and water poop (stool) often. Diarrhea can make you feel weak and cause you to get dehydrated. Dehydration can make you tired and thirsty, make you have a dry mouth, and make it so you pee (urinate) less often. Diarrhea often lasts 2-3 days. However, it can last longer if it is a sign of something more serious. It is important to treat your diarrhea as told by your doctor. °Follow these instructions at home: °Eating and drinking ° °Follow these recommendations as told by your doctor: °· Take an oral rehydration solution (ORS). This is a drink that is sold at pharmacies and stores. °· Drink clear fluids, such as: °? Water. °? Ice chips. °? Diluted fruit juice. °? Low-calorie sports drinks. °· Eat bland, easy-to-digest foods in small amounts as you are able. These foods include: °? Bananas. °? Applesauce. °? Rice. °? Low-fat (lean) meats. °? Toast. °? Crackers. °· Avoid drinking fluids that have a lot of sugar or caffeine in them. °· Avoid alcohol. °· Avoid spicy or fatty foods. ° °General instructions ° °· Drink enough fluid to keep your pee (urine) clear or pale yellow. °· Wash your hands often. If you cannot use soap and water, use hand sanitizer. °· Make sure that all people in your home wash their hands well and often. °· Take over-the-counter and prescription medicines only as told by your doctor. °· Rest at home while you get better. °· Watch your condition for any changes. °· Take a warm bath to help with any burning or pain from having diarrhea. °· Keep all follow-up visits as told by your doctor. This is important. °Contact a doctor if: °· You have a fever. °· Your diarrhea gets worse. °· You have new symptoms. °· You cannot keep fluids down. °· You feel light-headed or dizzy. °· You have a headache. °· You have muscle cramps. °Get help right away if: °· You have chest pain. °· You feel very weak or you pass out (faint). °· You have bloody or black poop or  poop that look like tar. °· You have very bad pain, cramping, or bloating in your belly (abdomen). °· You have trouble breathing or you are breathing very quickly. °· Your heart is beating very quickly. °· Your skin feels cold and clammy. °· You feel confused. °· You have signs of dehydration, such as: °? Dark pee, hardly any pee, or no pee. °? Cracked lips. °? Dry mouth. °? Sunken eyes. °? Sleepiness. °? Weakness. °This information is not intended to replace advice given to you by your health care provider. Make sure you discuss any questions you have with your health care provider. °Document Released: 05/08/2008 Document Revised: 06/09/2016 Document Reviewed: 07/27/2015 °Elsevier Interactive Patient Education © 2018 [employer] ° °

## 2018-08-16 NOTE — Progress Notes (Signed)
Pain: 0 Patient stated she had not much energy. Antibiotic gave the patient diahrea, yellow watery.  Started yesterday . bp pills this am that's it.  Patient wanted to use [facility]

## 2018-10-04 ENCOUNTER — Telehealth: Payer: Self-pay | Admitting: Family Medicine

## 2018-10-04 ENCOUNTER — Ambulatory Visit (HOSPITAL_BASED_OUTPATIENT_CLINIC_OR_DEPARTMENT_OTHER): Payer: Medicare Other | Admitting: Family Medicine

## 2018-10-04 ENCOUNTER — Encounter: Payer: Self-pay | Admitting: Family Medicine

## 2018-10-04 ENCOUNTER — Other Ambulatory Visit: Payer: Self-pay | Admitting: Family Medicine

## 2018-10-04 ENCOUNTER — Other Ambulatory Visit: Payer: Self-pay

## 2018-10-04 ENCOUNTER — Other Ambulatory Visit (HOSPITAL_COMMUNITY)
Admission: RE | Admit: 2018-10-04 | Discharge: 2018-10-04 | Disposition: A | Discharge status: Home / Self Care | Payer: Medicare Other | Source: Ambulatory Visit | Attending: Family Medicine | Admitting: Family Medicine

## 2018-10-04 VITALS — BP 122/73 | HR 72 | Temp 98.1°F | Resp 18 | Ht 66.0 in | Wt 174.2 lb

## 2018-10-04 DIAGNOSIS — E782 Mixed hyperlipidemia: Secondary | ICD-10-CM

## 2018-10-04 DIAGNOSIS — I10 Essential (primary) hypertension: Secondary | ICD-10-CM

## 2018-10-04 DIAGNOSIS — Z79899 Other long term (current) drug therapy: Secondary | ICD-10-CM

## 2018-10-04 DIAGNOSIS — R590 Localized enlarged lymph nodes: Secondary | ICD-10-CM

## 2018-10-04 DIAGNOSIS — Z0000 Encounter for general adult medical examination without abnormal findings: Secondary | ICD-10-CM

## 2018-10-04 DIAGNOSIS — R0989 Other specified symptoms and signs involving the circulatory and respiratory systems: Secondary | ICD-10-CM | POA: Diagnosis not present

## 2018-10-04 DIAGNOSIS — Z124 Encounter for screening for malignant neoplasm of cervix: Secondary | ICD-10-CM | POA: Diagnosis present

## 2018-10-04 NOTE — Patient Instructions (Signed)

## 2018-10-04 NOTE — Progress Notes (Signed)
Subjective:    Patient ID: Tracy James, female    DOB: 12/10/1950, 67 y.o.   MRN: [id number]  HPI 67 yo female who presents for an annual well exam. Per chart review it appears that the last pap was done 09/21/15 and was negative for intraepithelial lesions or malignancy but specimen was partially obscured by inflammation. Last mammogram was done 11/20/2017 and was normal.  Patient is here for her initial Medicare examination and patient also with a history of hypertension and hyperlipidemia.  Patient states that she is fasting at today's visit in case blood work needs to be done.  Patient reports compliance with her blood pressure medication.  Patient is not currently on prescription medication for her cholesterol but she does take an omega-3 supplement.  Patient reports that she is independent with all ADLs.  Patient has no concerns regarding personal safety.  Patient reports that she is a former smoker.  Patient quit smoking about 7 years ago.  Patient has had no further surgeries.  Patient reports that she has had prior colonoscopy but she is not sure how long ago this was.  Patient does not wish to reschedule at this time.  Past Medical History:  Diagnosis Date  . Allergy    SEASONAL  . Heart murmur   . Hypertension   Surgical History: Patient has had no past surgeries Family History  Problem Relation Age of Onset  . Arthritis Mother   . Cancer Mother        colon cancer  . Diabetes Mother   . Hypertension Mother   . Cancer Brother        LUNG- smoking, [place]  . Early death Brother   . Arthritis Maternal Grandmother   . Heart disease Maternal Grandmother        massive heart attack  . Heart disease Paternal Grandmother   . Asthma Brother   . Diabetes Brother   . Hypertension Brother   . Heart disease Father        anuersym- ruptered   Social History   Tobacco Use  . Smoking status: Former Smoker    Last attempt to quit: 12/05/2011    Years since quitting: 6.8  .  Smokeless tobacco: Never Used  Substance Use Topics  . Alcohol use: Yes    Comment: occasionally  . Drug use: No  No Known Allergies     Review of Systems  Constitutional: Negative for chills and fever.  HENT: Negative for dental problem, hearing loss and trouble swallowing.   Eyes: Negative for photophobia and visual disturbance.  Respiratory: Negative for cough and shortness of breath.   Cardiovascular: Negative for chest pain, palpitations and leg swelling.  Gastrointestinal: Negative for abdominal pain, blood in stool, constipation, diarrhea and nausea.  Genitourinary: Negative for dysuria and frequency.  Musculoskeletal: Negative for back pain and gait problem.  Skin: Positive for rash. Negative for wound.  Neurological: Negative for dizziness and headaches.       Objective:   Physical Exam BP 122/73   Pulse 72   Temp 98.1 F (36.7 C) (Oral)   Resp 18   Ht 5\' 6"  (1.676 m)   Wt 174 lb 3.2 oz (79 kg)   SpO2 100%   BMI 28.12 kg/m   Nurse's notes and vital signs reviewed General-well-nourished, well-developed older female in no acute distress Neck-supple, patient with positive bilateral carotid bruits, no thyromegaly, no lymphadenopathy Cardiovascular-regular rate and rhythm; murmur (patient with known aortic stenosis per  chart) Abdomen-soft, nontender Breast exam- patient with an enlarged right upper medial enlarged lymph node, nontender.  Patient has recently shaved her underarm area bilaterally, no skin changes, no nipple discharge.  No left axillary adenopathy.  Patient with dense tissue mass in the left upper breast approximately 1 to 2 o'clock position which patient states has been present previously. Back-no CVA tenderness GU-normal external genitalia, normal appearance of the vaginal canal and mucosa, patient with some very mild clear to light white discharge within the canal near the cervix.  Patient with normal appearance to the cervix.  Patient did have some mild  bleeding on the left upper cervix with collection of Pap sample.  No cervical motion tenderness or adnexal tenderness with bimanual exam. Extremities-no edema. Skin- patient with papular rash on the right posterior wrist/back of hand       Assessment & Plan:  1. Medicare annual wellness visit, initial Patient with initial Medicare annual wellness visit.  Patient denies any difficulty with performance of ADLs.  Patient has had no falls in the past 12 months.  See information in chart for Medicare annual wellness exam.    2. Screening for cervical cancer Patient had Pap smear done at today's visit as a screening for cervical cancer.  Patient will be notified of the results and if any further treatment or follow-up is needed based on these results. - Cytology - PAP  3. Mixed hyperlipidemia Patient with a history of hyperlipidemia for which she will have lipid panel and CMP at today's visit that she is fasting.  Patient is aware that this is not considered to be part of the annual well exam as this is a pre-existing condition. - Lipid panel - Comprehensive metabolic panel  4. Bilateral carotid bruits Patient was found to have a carotid bruit on today's exam and patient will be scheduled for lipid panel in follow-up.  Patient is also being scheduled for carotid ultrasound - Lipid panel  5. Essential hypertension Patient with essential hypertension as well as a history of hyperlipidemia.  Patient will have fasting lab visit for lipid panel and CMP in follow-up of hypertension, hyperlipidemia and long-term use of medications.  Patient is encouraged to continue a low-sodium diet and regular exercise and to continue to monitor her blood pressure.  Continue losartan and HCTZ - Lipid panel - Comprehensive metabolic panel  6. Encounter for long-term (current) use of medications Patient with encounter for long-term use of medications and patient will have CMP and lipid panel done in follow-up of  long-term use of medications including - Comprehensive metabolic panel-  *Patient with abnormalities on today's exam including carotid bruit for which she will be scheduled for carotid ultrasound and patient with enlarged axillary lymph node for which she will have diagnostic mammogram and ultrasound of the axilla and breast.  *Patient was offered but declined influenza immunization at today's visit  An After Visit Summary was printed and given to the patient.  Return in about 3 months (around 01/04/2019) for blood pressure.

## 2018-10-04 NOTE — Progress Notes (Signed)
Patient ID: Tracy James, female   DOB: 1951-07-20, 67 y.o.   MRN: [id number]   Patient with Medicare annual wellness exam.  Patient did have right axillary enlarged lymph node.  Recommended diagnostic mammogram.

## 2018-10-04 NOTE — Progress Notes (Signed)
Patient ID: Tracy James, female   DOB: [birth year], 67 y.o.   MRN: [id number]   Patient is in the office this morning for her annual well exam.  Patient however does have abnormalities on exam including bilateral carotid bruits.  Patient does have history of aortic stenosis and some of the noise in the carotids may be secondary to referred noise from her murmur however patient also with a history of hypertriglyceridemia and patient will be scheduled for carotid ultrasound and will also have lipid panel and CMP done today.

## 2018-10-04 NOTE — Telephone Encounter (Signed)
Patient called requesting a refill of her hydrocortisone cream and also requesting information about her next appointment, stated she didn't understand why she needed to return 01/2018. Please follow up.

## 2018-10-05 LAB — COMPREHENSIVE METABOLIC PANEL WITH GFR
ALT: 11 IU/L (ref 0–32)
AST: 13 IU/L (ref 0–40)
Albumin/Globulin Ratio: 1.5 (ref 1.2–2.2)
Albumin: 4 g/dL (ref 3.6–4.8)
Alkaline Phosphatase: 52 IU/L (ref 39–117)
BUN/Creatinine Ratio: 19 (ref 12–28)
BUN: 23 mg/dL (ref 8–27)
Bilirubin Total: 0.4 mg/dL (ref 0.0–1.2)
CO2: 23 mmol/L (ref 20–29)
Calcium: 10.1 mg/dL (ref 8.7–10.3)
Chloride: 104 mmol/L (ref 96–106)
Creatinine, Ser: 1.21 mg/dL — ABNORMAL HIGH (ref 0.57–1.00)
GFR calc Af Amer: 54 mL/min/1.73 — ABNORMAL LOW
GFR calc non Af Amer: 47 mL/min/1.73 — ABNORMAL LOW
Globulin, Total: 2.7 g/dL (ref 1.5–4.5)
Glucose: 100 mg/dL — ABNORMAL HIGH (ref 65–99)
Potassium: 4.8 mmol/L (ref 3.5–5.2)
Sodium: 141 mmol/L (ref 134–144)
Total Protein: 6.7 g/dL (ref 6.0–8.5)

## 2018-10-05 LAB — LIPID PANEL
Chol/HDL Ratio: 4.3 ratio (ref 0.0–4.4)
Cholesterol, Total: 171 mg/dL (ref 100–199)
HDL: 40 mg/dL
LDL Calculated: 106 mg/dL — ABNORMAL HIGH (ref 0–99)
Triglycerides: 127 mg/dL (ref 0–149)
VLDL Cholesterol Cal: 25 mg/dL (ref 5–40)

## 2018-10-07 ENCOUNTER — Other Ambulatory Visit: Payer: Self-pay | Admitting: Family Medicine

## 2018-10-07 DIAGNOSIS — R21 Rash and other nonspecific skin eruption: Secondary | ICD-10-CM

## 2018-10-07 LAB — CYTOLOGY - PAP: Diagnosis: NEGATIVE

## 2018-10-07 MED ORDER — HYDROCORTISONE 0.5 % EX CREA: 1.0000 "application " | TOPICAL_CREAM | Freq: Two times a day (BID) | CUTANEOUS | 0 refills | Status: DC

## 2018-10-07 NOTE — Progress Notes (Signed)
Patient ID: Tracy James, female   DOB: [birth year], 67 y.o.   MRN: [id number]   Patient called to request refill of hydrocortisone cream.  Patient also questioned why she needed to follow-up in 4 months from her most recent visit.  Patient will be contacted by the CMA to let her know that a 109-month follow-up was arranged for her hyperlipidemia and hypertension.  Patient however if she chooses can cancel that appointment but I would like to see patient within 6 months of her most recent visit here.  Refill of her hydrocortisone cream will be sent to pharmacy.

## 2018-10-07 NOTE — Telephone Encounter (Signed)
Patient already has a follow up appointment scheduled. No further questions.

## 2018-10-07 NOTE — Telephone Encounter (Signed)
Please let patient know that I will look at her chart and refill her hydrocortisone cream.  Patient was asked to return to clinic in follow-up of her hypertension and hyperlipidemia in 4 months from her recent appointment.  It is up to patient if she wishes to return at that time otherwise I would like to see her no later than 6 months from her most recent visit

## 2018-10-10 ENCOUNTER — Ambulatory Visit (HOSPITAL_COMMUNITY)
Admission: RE | Admit: 2018-10-10 | Discharge: 2018-10-10 | Disposition: A | Discharge status: Home / Self Care | Payer: Medicare Other | Source: Ambulatory Visit | Attending: Family Medicine | Admitting: Family Medicine

## 2018-10-10 DIAGNOSIS — E782 Mixed hyperlipidemia: Secondary | ICD-10-CM | POA: Insufficient documentation

## 2018-10-10 DIAGNOSIS — Z79899 Other long term (current) drug therapy: Secondary | ICD-10-CM | POA: Diagnosis not present

## 2018-10-10 DIAGNOSIS — R0989 Other specified symptoms and signs involving the circulatory and respiratory systems: Secondary | ICD-10-CM | POA: Insufficient documentation

## 2018-10-10 NOTE — Progress Notes (Signed)
Bilateral carotid duplex completed - Preliminary results - There is no evidence of ICA stenosis. Vertebral artery flow is antegrade. [facility], [place]  10/10/2018 4:22 Pm

## 2018-10-11 NOTE — Telephone Encounter (Signed)
-----   Message from Antony Blackbird, MD sent at 10/09/2018 11:44 PM EST ----- Please notify patient that her recent Pap smear was negative for intraepithelial lesion or malignancy.  Pap smear was normal

## 2018-10-11 NOTE — Telephone Encounter (Signed)
Patient verified DOB Patient is aware of PAP being normal.  Patient is also aware of needing to increase water intake and eliminate NSAID to help protect the kidneys. Patient completed US carotid on yesterday and patient had results discussed at the office after procedure. Patient had no further questions.

## 2018-10-24 ENCOUNTER — Ambulatory Visit: Payer: Self-pay | Admitting: Family Medicine

## 2018-10-27 ENCOUNTER — Other Ambulatory Visit: Payer: Self-pay | Admitting: Family Medicine

## 2018-10-29 ENCOUNTER — Telehealth: Payer: Self-pay | Admitting: Family Medicine

## 2018-10-29 ENCOUNTER — Other Ambulatory Visit: Payer: Self-pay | Admitting: Family Medicine

## 2018-10-29 NOTE — Telephone Encounter (Signed)
Chabely was still on the phone with the patient when I saw this, I let her know to inform the patient that they have refills at [place] and should request that [facility] transfer the RX there.

## 2018-10-29 NOTE — Telephone Encounter (Signed)
Patient has requested a note to be put in for future prescriptions to be sent to   [facility], [place], [place] [address]

## 2018-10-29 NOTE — Telephone Encounter (Signed)
Patient called to get a refill for her hydrochlorothiazide. Patient would like it sent to [facility] on [address] [phone]

## 2018-11-22 ENCOUNTER — Ambulatory Visit
Admission: RE | Admit: 2018-11-22 | Discharge: 2018-11-22 | Disposition: A | Discharge status: Home / Self Care | Payer: Medicare Other | Source: Ambulatory Visit | Attending: Family Medicine | Admitting: Family Medicine

## 2018-11-22 DIAGNOSIS — R590 Localized enlarged lymph nodes: Secondary | ICD-10-CM

## 2018-11-22 IMAGING — MG DIGITAL DIAGNOSTIC BILATERAL MAMMOGRAM WITH TOMO AND CAD
1 series · 1 of 1 positions shown · non-contrast
Comparison: Previous exam(s).

CLINICAL DATA: 67-year-old female with palpable abnormality in the
right axilla.

EXAM:
DIGITAL DIAGNOSTIC BILATERAL MAMMOGRAM WITH CAD AND TOMO
ULTRASOUND OF THE RIGHT AXILLA

[R MLO synth-2D]
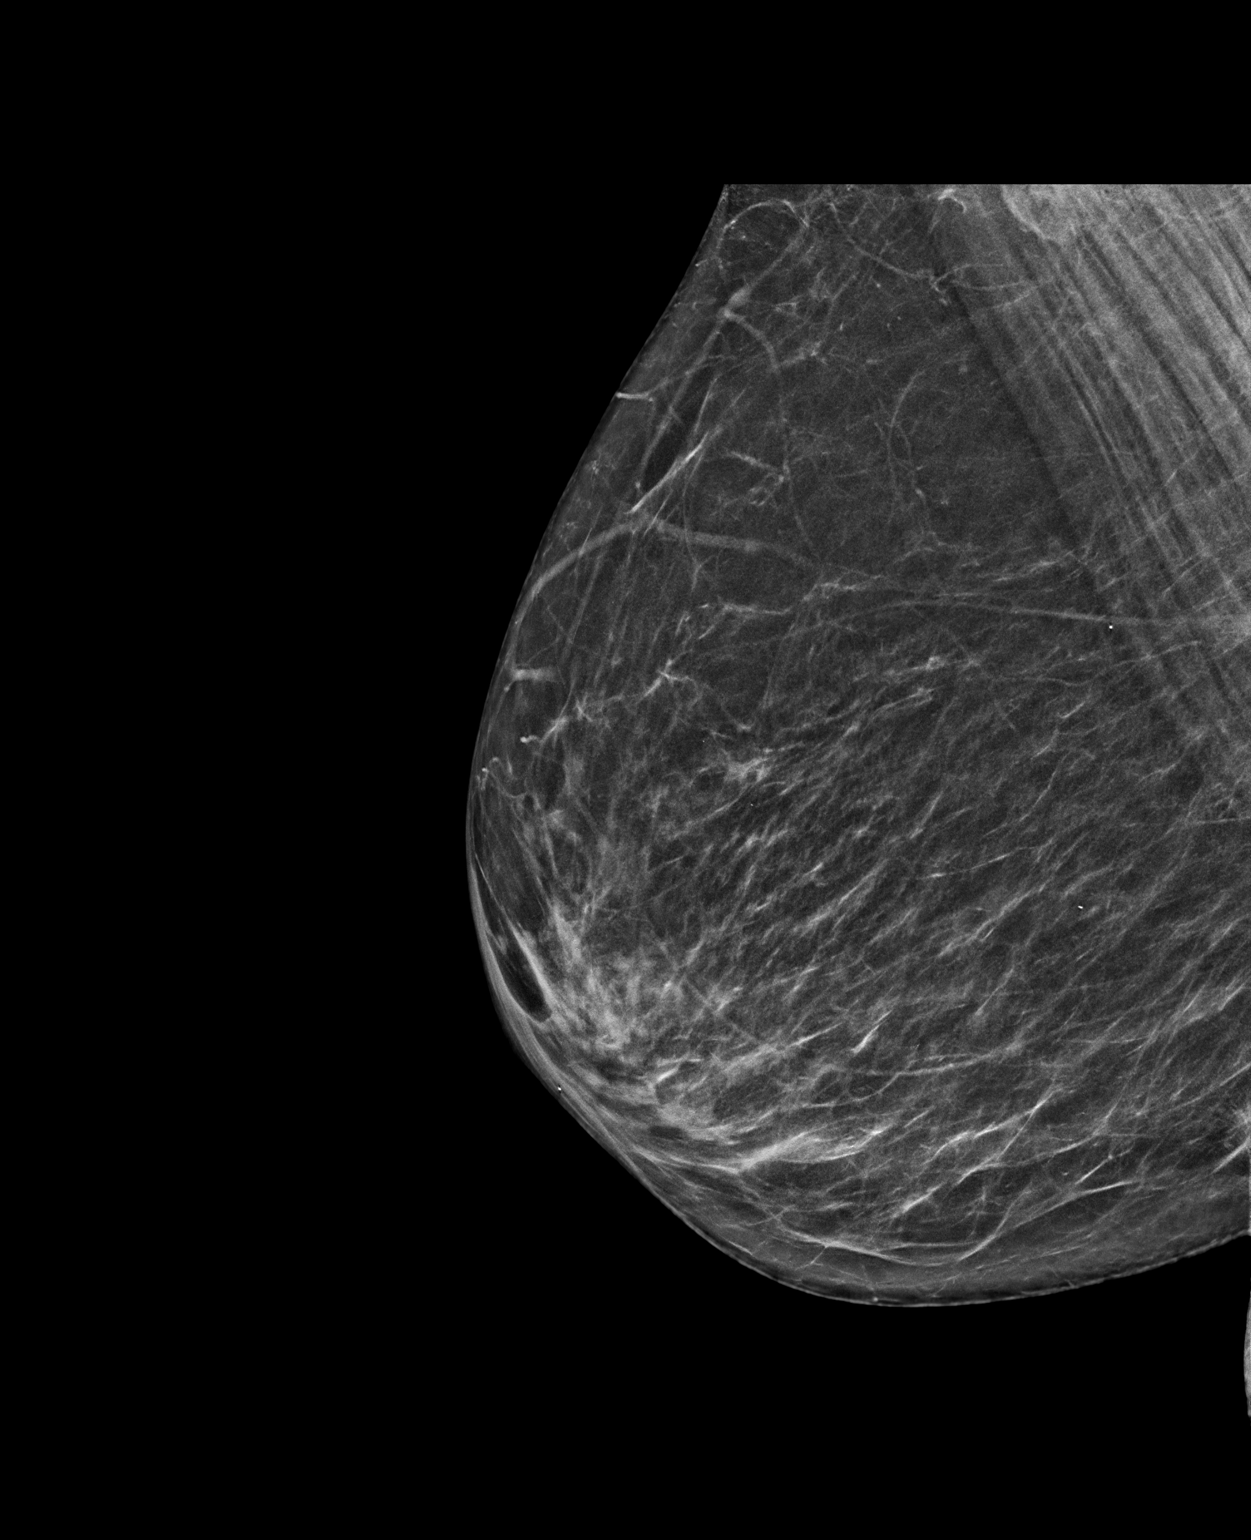

[1 of 1 positions shown; findings below may reference images not displayed]

ACR Breast Density Category b: There are scattered areas of
fibroglandular density.
FINDINGS: No suspicious masses or calcifications are seen in either breast.
Spot compression tangential tomograms were performed of the right
axilla with no abnormalities seen. The lymph nodes in the right
axilla appear mammographically stable dating back to at least [U7].

Mammographic images were processed with CAD.

Physical examination of the right axilla does not reveal any
palpable masses.

Targeted ultrasound of the right axilla was performed. No suspicious
masses or abnormality seen, only normal-appearing axillary lymph
nodes identified.
IMPRESSION: 1.  No findings of malignancy in either breast.

2. No abnormalities identified at the site of palpable concern in
the right axilla.

RECOMMENDATION:
1. Clinical follow-up as needed for right axillary palpable
abnormality.

2.  Screening mammogram in one year.(Code:[U7])

I have discussed the findings and recommendations with the patient.
Results were also provided in writing at the conclusion of the
visit. If applicable, a reminder letter will be sent to the patient
regarding the next appointment.

BI-RADS CATEGORY  1: Negative.

## 2018-11-22 IMAGING — US US AXILLARY RIGHT
1 series · 5 of 5 positions shown · non-contrast
Comparison: Previous exam(s).

CLINICAL DATA: 67-year-old female with palpable abnormality in the
right axilla.

EXAM:
DIGITAL DIAGNOSTIC BILATERAL MAMMOGRAM WITH CAD AND TOMO
ULTRASOUND OF THE RIGHT AXILLA

[Series 1: us axillary right · 0.07mm/px · 5 of 5 slices shown]
[im 1/5]
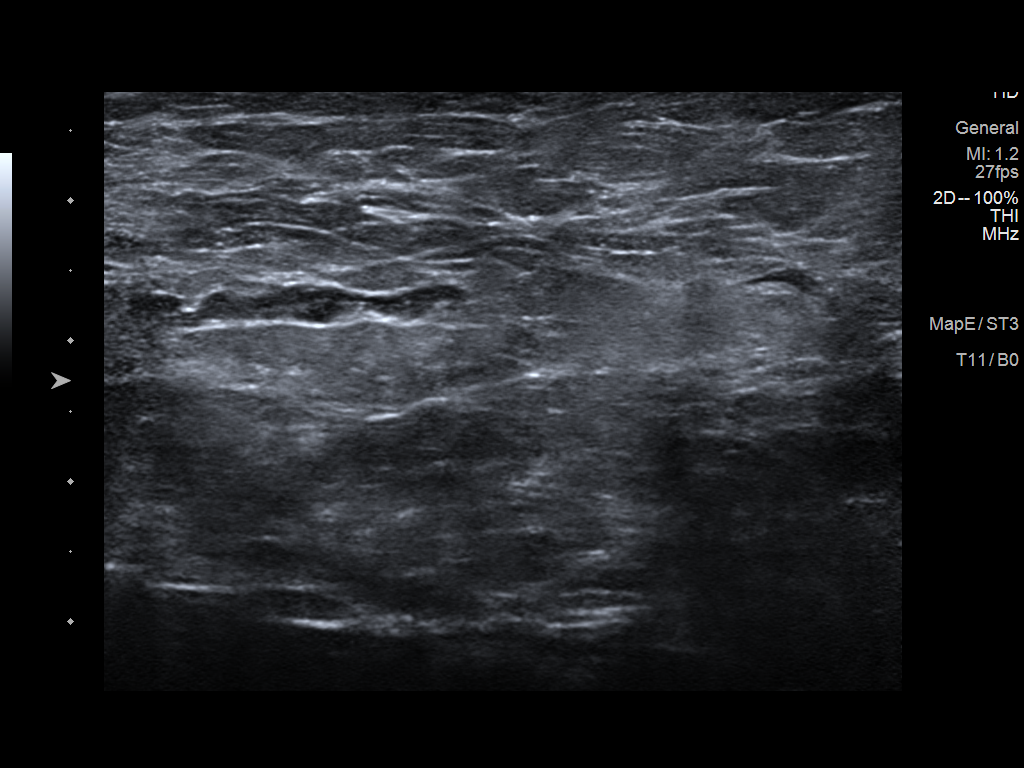
[im 2/5]
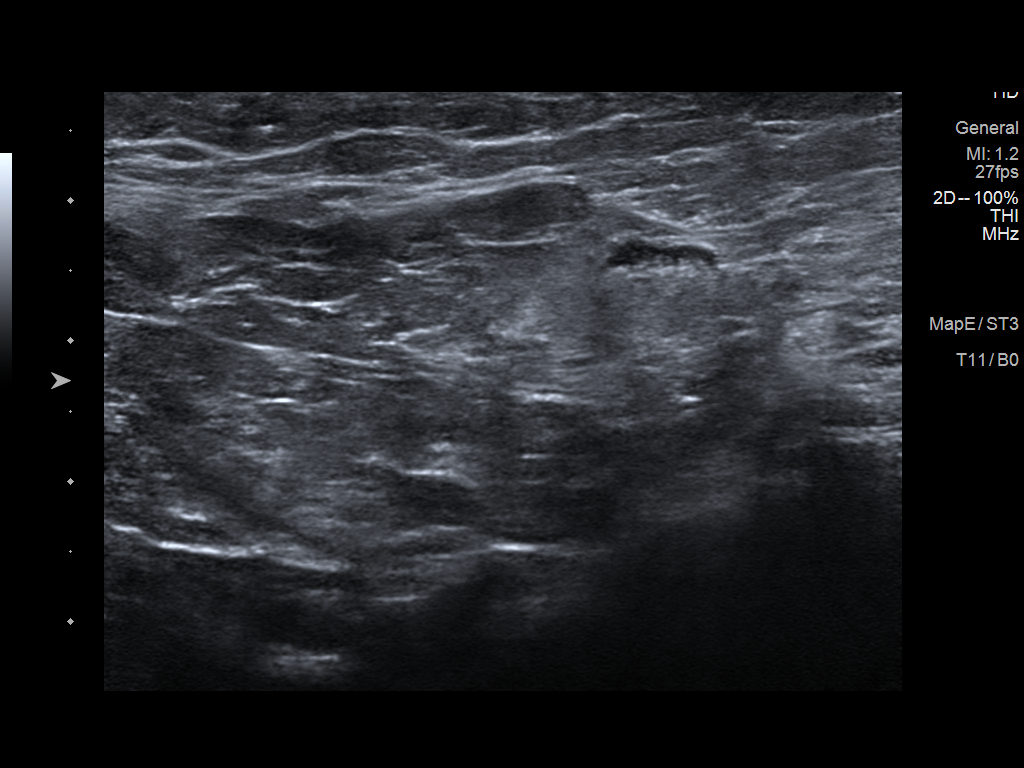
[im 3/5]
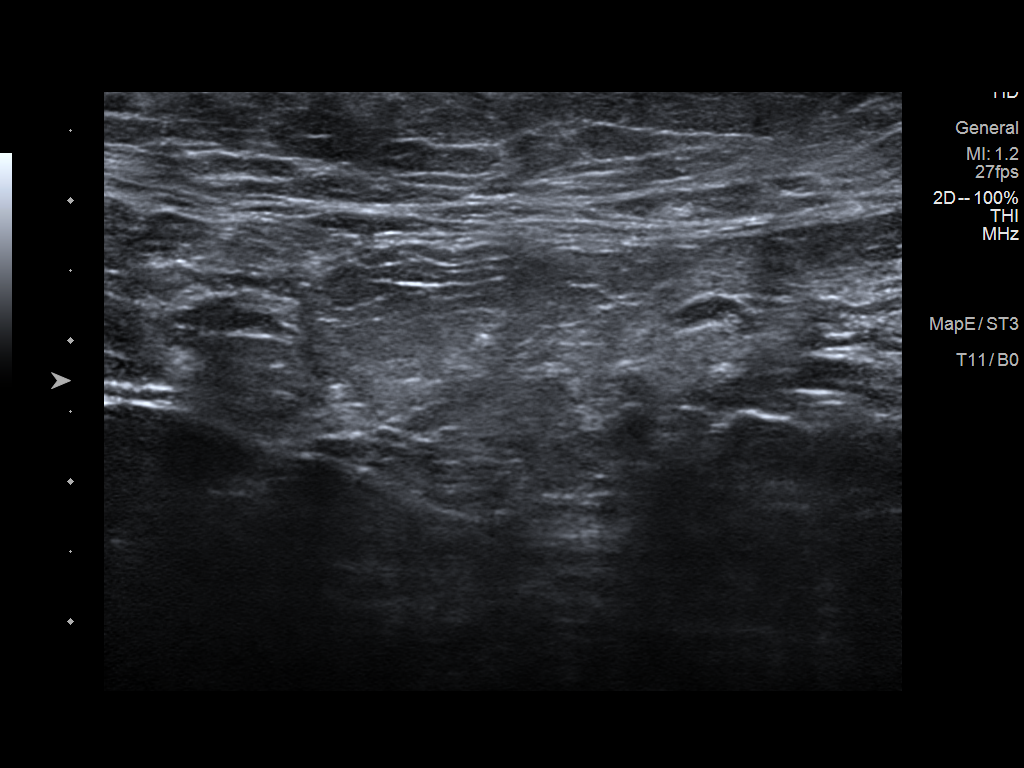
[im 4/5]
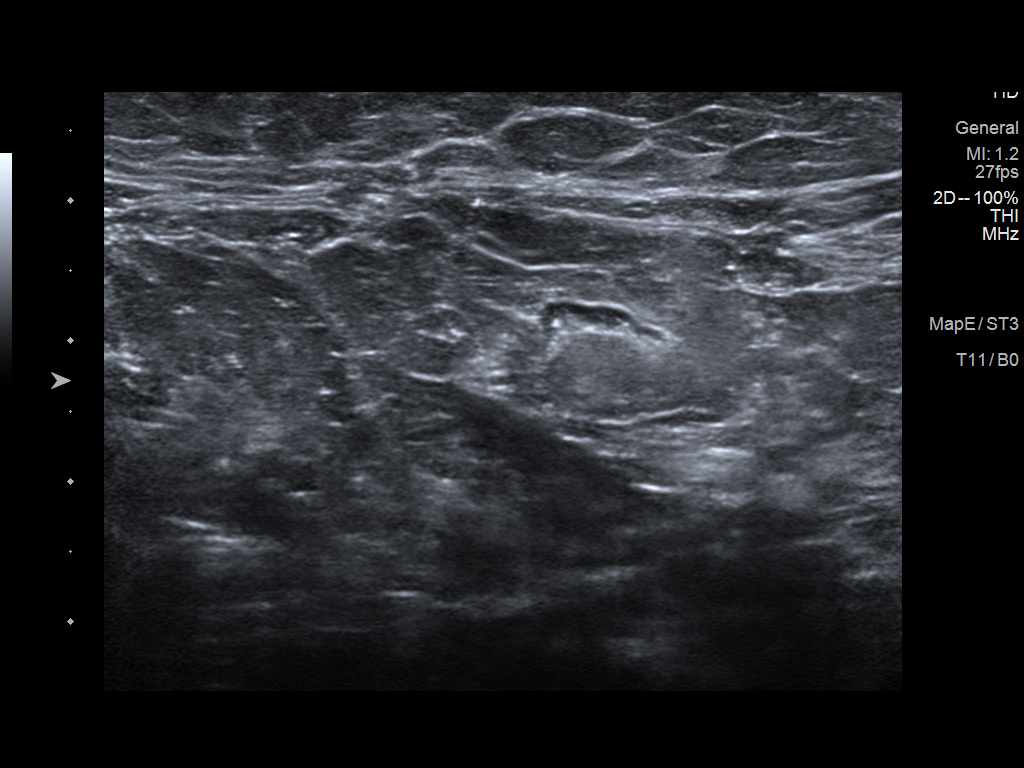
[im 5/5]
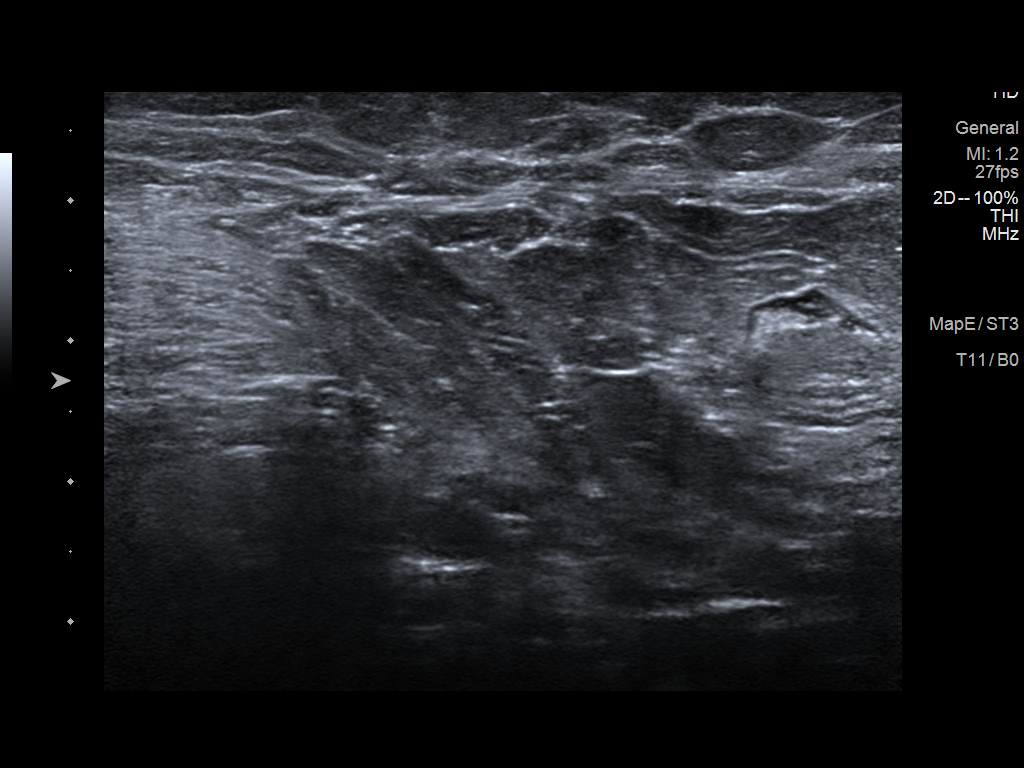

[5 of 5 positions shown; findings below may reference images not displayed]

ACR Breast Density Category b: There are scattered areas of
fibroglandular density.
FINDINGS: No suspicious masses or calcifications are seen in either breast.
Spot compression tangential tomograms were performed of the right
axilla with no abnormalities seen. The lymph nodes in the right
axilla appear mammographically stable dating back to at least [U7].

Mammographic images were processed with CAD.

Physical examination of the right axilla does not reveal any
palpable masses.

Targeted ultrasound of the right axilla was performed. No suspicious
masses or abnormality seen, only normal-appearing axillary lymph
nodes identified.
IMPRESSION: 1.  No findings of malignancy in either breast.

2. No abnormalities identified at the site of palpable concern in
the right axilla.

RECOMMENDATION:
1. Clinical follow-up as needed for right axillary palpable
abnormality.

2.  Screening mammogram in one year.(Code:[U7])

I have discussed the findings and recommendations with the patient.
Results were also provided in writing at the conclusion of the
visit. If applicable, a reminder letter will be sent to the patient
regarding the next appointment.

BI-RADS CATEGORY  1: Negative.

## 2018-12-03 ENCOUNTER — Emergency Department (HOSPITAL_COMMUNITY): Payer: Medicare Other

## 2018-12-03 ENCOUNTER — Encounter: Payer: Self-pay | Admitting: Nurse Practitioner

## 2018-12-03 ENCOUNTER — Ambulatory Visit (HOSPITAL_BASED_OUTPATIENT_CLINIC_OR_DEPARTMENT_OTHER): Payer: Medicare Other | Admitting: Nurse Practitioner

## 2018-12-03 ENCOUNTER — Inpatient Hospital Stay (HOSPITAL_COMMUNITY)
Admission: EM | Admit: 2018-12-03 | Discharge: 2018-12-07 | DRG: 308 | Disposition: A | Discharge status: Home / Self Care | Payer: Medicare Other | Attending: Family Medicine | Admitting: Family Medicine

## 2018-12-03 ENCOUNTER — Other Ambulatory Visit: Payer: Self-pay

## 2018-12-03 ENCOUNTER — Encounter (HOSPITAL_COMMUNITY): Payer: Self-pay | Admitting: Emergency Medicine

## 2018-12-03 VITALS — BP 115/77 | Ht 66.0 in | Wt 181.0 lb

## 2018-12-03 DIAGNOSIS — I429 Cardiomyopathy, unspecified: Secondary | ICD-10-CM | POA: Diagnosis present

## 2018-12-03 DIAGNOSIS — N179 Acute kidney failure, unspecified: Secondary | ICD-10-CM | POA: Diagnosis present

## 2018-12-03 DIAGNOSIS — Z6829 Body mass index (BMI) 29.0-29.9, adult: Secondary | ICD-10-CM | POA: Diagnosis not present

## 2018-12-03 DIAGNOSIS — R0602 Shortness of breath: Secondary | ICD-10-CM

## 2018-12-03 DIAGNOSIS — N183 Chronic kidney disease, stage 3 (moderate): Secondary | ICD-10-CM | POA: Diagnosis present

## 2018-12-03 DIAGNOSIS — J180 Bronchopneumonia, unspecified organism: Secondary | ICD-10-CM | POA: Diagnosis present

## 2018-12-03 DIAGNOSIS — R0989 Other specified symptoms and signs involving the circulatory and respiratory systems: Secondary | ICD-10-CM | POA: Insufficient documentation

## 2018-12-03 DIAGNOSIS — I129 Hypertensive chronic kidney disease with stage 1 through stage 4 chronic kidney disease, or unspecified chronic kidney disease: Secondary | ICD-10-CM | POA: Diagnosis present

## 2018-12-03 DIAGNOSIS — J439 Emphysema, unspecified: Secondary | ICD-10-CM | POA: Diagnosis present

## 2018-12-03 DIAGNOSIS — E785 Hyperlipidemia, unspecified: Secondary | ICD-10-CM | POA: Diagnosis present

## 2018-12-03 DIAGNOSIS — Z87891 Personal history of nicotine dependence: Secondary | ICD-10-CM

## 2018-12-03 DIAGNOSIS — E781 Pure hyperglyceridemia: Secondary | ICD-10-CM | POA: Diagnosis present

## 2018-12-03 DIAGNOSIS — Z800 Family history of malignant neoplasm of digestive organs: Secondary | ICD-10-CM

## 2018-12-03 DIAGNOSIS — Z825 Family history of asthma and other chronic lower respiratory diseases: Secondary | ICD-10-CM

## 2018-12-03 DIAGNOSIS — B9789 Other viral agents as the cause of diseases classified elsewhere: Secondary | ICD-10-CM

## 2018-12-03 DIAGNOSIS — Z7901 Long term (current) use of anticoagulants: Secondary | ICD-10-CM

## 2018-12-03 DIAGNOSIS — Z8249 Family history of ischemic heart disease and other diseases of the circulatory system: Secondary | ICD-10-CM

## 2018-12-03 DIAGNOSIS — R011 Cardiac murmur, unspecified: Secondary | ICD-10-CM

## 2018-12-03 DIAGNOSIS — Z7982 Long term (current) use of aspirin: Secondary | ICD-10-CM

## 2018-12-03 DIAGNOSIS — Z8261 Family history of arthritis: Secondary | ICD-10-CM | POA: Diagnosis not present

## 2018-12-03 DIAGNOSIS — I10 Essential (primary) hypertension: Secondary | ICD-10-CM | POA: Diagnosis not present

## 2018-12-03 DIAGNOSIS — I513 Intracardiac thrombosis, not elsewhere classified: Secondary | ICD-10-CM | POA: Diagnosis present

## 2018-12-03 DIAGNOSIS — E663 Overweight: Secondary | ICD-10-CM | POA: Diagnosis present

## 2018-12-03 DIAGNOSIS — Z79899 Other long term (current) drug therapy: Secondary | ICD-10-CM

## 2018-12-03 DIAGNOSIS — I248 Other forms of acute ischemic heart disease: Secondary | ICD-10-CM | POA: Diagnosis present

## 2018-12-03 DIAGNOSIS — J988 Other specified respiratory disorders: Secondary | ICD-10-CM | POA: Diagnosis not present

## 2018-12-03 DIAGNOSIS — I4891 Unspecified atrial fibrillation: Principal | ICD-10-CM | POA: Diagnosis present

## 2018-12-03 DIAGNOSIS — I43 Cardiomyopathy in diseases classified elsewhere: Secondary | ICD-10-CM | POA: Diagnosis not present

## 2018-12-03 DIAGNOSIS — Z833 Family history of diabetes mellitus: Secondary | ICD-10-CM

## 2018-12-03 DIAGNOSIS — I351 Nonrheumatic aortic (valve) insufficiency: Secondary | ICD-10-CM | POA: Diagnosis not present

## 2018-12-03 DIAGNOSIS — I4819 Other persistent atrial fibrillation: Secondary | ICD-10-CM | POA: Diagnosis present

## 2018-12-03 DIAGNOSIS — J40 Bronchitis, not specified as acute or chronic: Secondary | ICD-10-CM | POA: Diagnosis present

## 2018-12-03 DIAGNOSIS — I350 Nonrheumatic aortic (valve) stenosis: Secondary | ICD-10-CM | POA: Diagnosis present

## 2018-12-03 DIAGNOSIS — R079 Chest pain, unspecified: Secondary | ICD-10-CM

## 2018-12-03 DIAGNOSIS — I340 Nonrheumatic mitral (valve) insufficiency: Secondary | ICD-10-CM | POA: Diagnosis not present

## 2018-12-03 DIAGNOSIS — R000 Tachycardia, unspecified: Secondary | ICD-10-CM | POA: Diagnosis not present

## 2018-12-03 LAB — CBC
HCT: 43.4 % (ref 36.0–46.0)
HEMOGLOBIN: 14 g/dL (ref 12.0–15.0)
MCH: 32 pg (ref 26.0–34.0)
MCHC: 32.3 g/dL (ref 30.0–36.0)
MCV: 99.1 fL (ref 80.0–100.0)
Platelets: 282 10*3/uL (ref 150–400)
RBC: 4.38 MIL/uL (ref 3.87–5.11)
RDW: 14.3 % (ref 11.5–15.5)
WBC: 10.7 10*3/uL — ABNORMAL HIGH (ref 4.0–10.5)
nRBC: 0 % (ref 0.0–0.2)

## 2018-12-03 LAB — TSH: TSH: 0.991 u[IU]/mL (ref 0.350–4.500)

## 2018-12-03 LAB — BASIC METABOLIC PANEL
Anion gap: 12 (ref 5–15)
BUN: 28 mg/dL — ABNORMAL HIGH (ref 8–23)
CO2: 21 mmol/L — ABNORMAL LOW (ref 22–32)
Calcium: 9.1 mg/dL (ref 8.9–10.3)
Chloride: 104 mmol/L (ref 98–111)
Creatinine, Ser: 1.56 mg/dL — ABNORMAL HIGH (ref 0.44–1.00)
GFR calc Af Amer: 40 mL/min — ABNORMAL LOW (ref >60)
GFR, EST NON AFRICAN AMERICAN: 35 mL/min — AB (ref >60)
GLUCOSE: 142 mg/dL — AB (ref 70–99)
Potassium: 3.7 mmol/L (ref 3.5–5.1)
Sodium: 137 mmol/L (ref 135–145)

## 2018-12-03 LAB — I-STAT TROPONIN, ED: Troponin i, poc: 0.04 ng/mL (ref 0.00–0.08)

## 2018-12-03 LAB — URINALYSIS, ROUTINE W REFLEX MICROSCOPIC
Bilirubin Urine: NEGATIVE
Glucose, UA: NEGATIVE mg/dL
Ketones, ur: NEGATIVE mg/dL
Leukocytes, UA: NEGATIVE
Nitrite: NEGATIVE
PROTEIN: NEGATIVE mg/dL
Specific Gravity, Urine: 1.014 (ref 1.005–1.030)
pH: 5 (ref 5.0–8.0)

## 2018-12-03 LAB — MAGNESIUM: Magnesium: 1.8 mg/dL (ref 1.7–2.4)

## 2018-12-03 LAB — PROCALCITONIN: Procalcitonin: <0.1 ng/mL

## 2018-12-03 LAB — ETHANOL: Alcohol, Ethyl (B): <10 mg/dL (ref <10)

## 2018-12-03 LAB — STREP PNEUMONIAE URINARY ANTIGEN: Strep Pneumo Urinary Antigen: NEGATIVE

## 2018-12-03 LAB — TROPONIN I: Troponin I: 0.03 ng/mL (ref <0.03)

## 2018-12-03 IMAGING — DX DG CHEST 2V
2 series · 2 of 2 positions shown · non-contrast
Comparison: Chest x-ray [DATE].

CLINICAL DATA: 67-year-old female with history of cold symptoms for
the past 2 weeks with shortness of breath for 1 week and cough for
the past 3 days.

EXAM:
CHEST - 2 VIEW

[w chest pa]
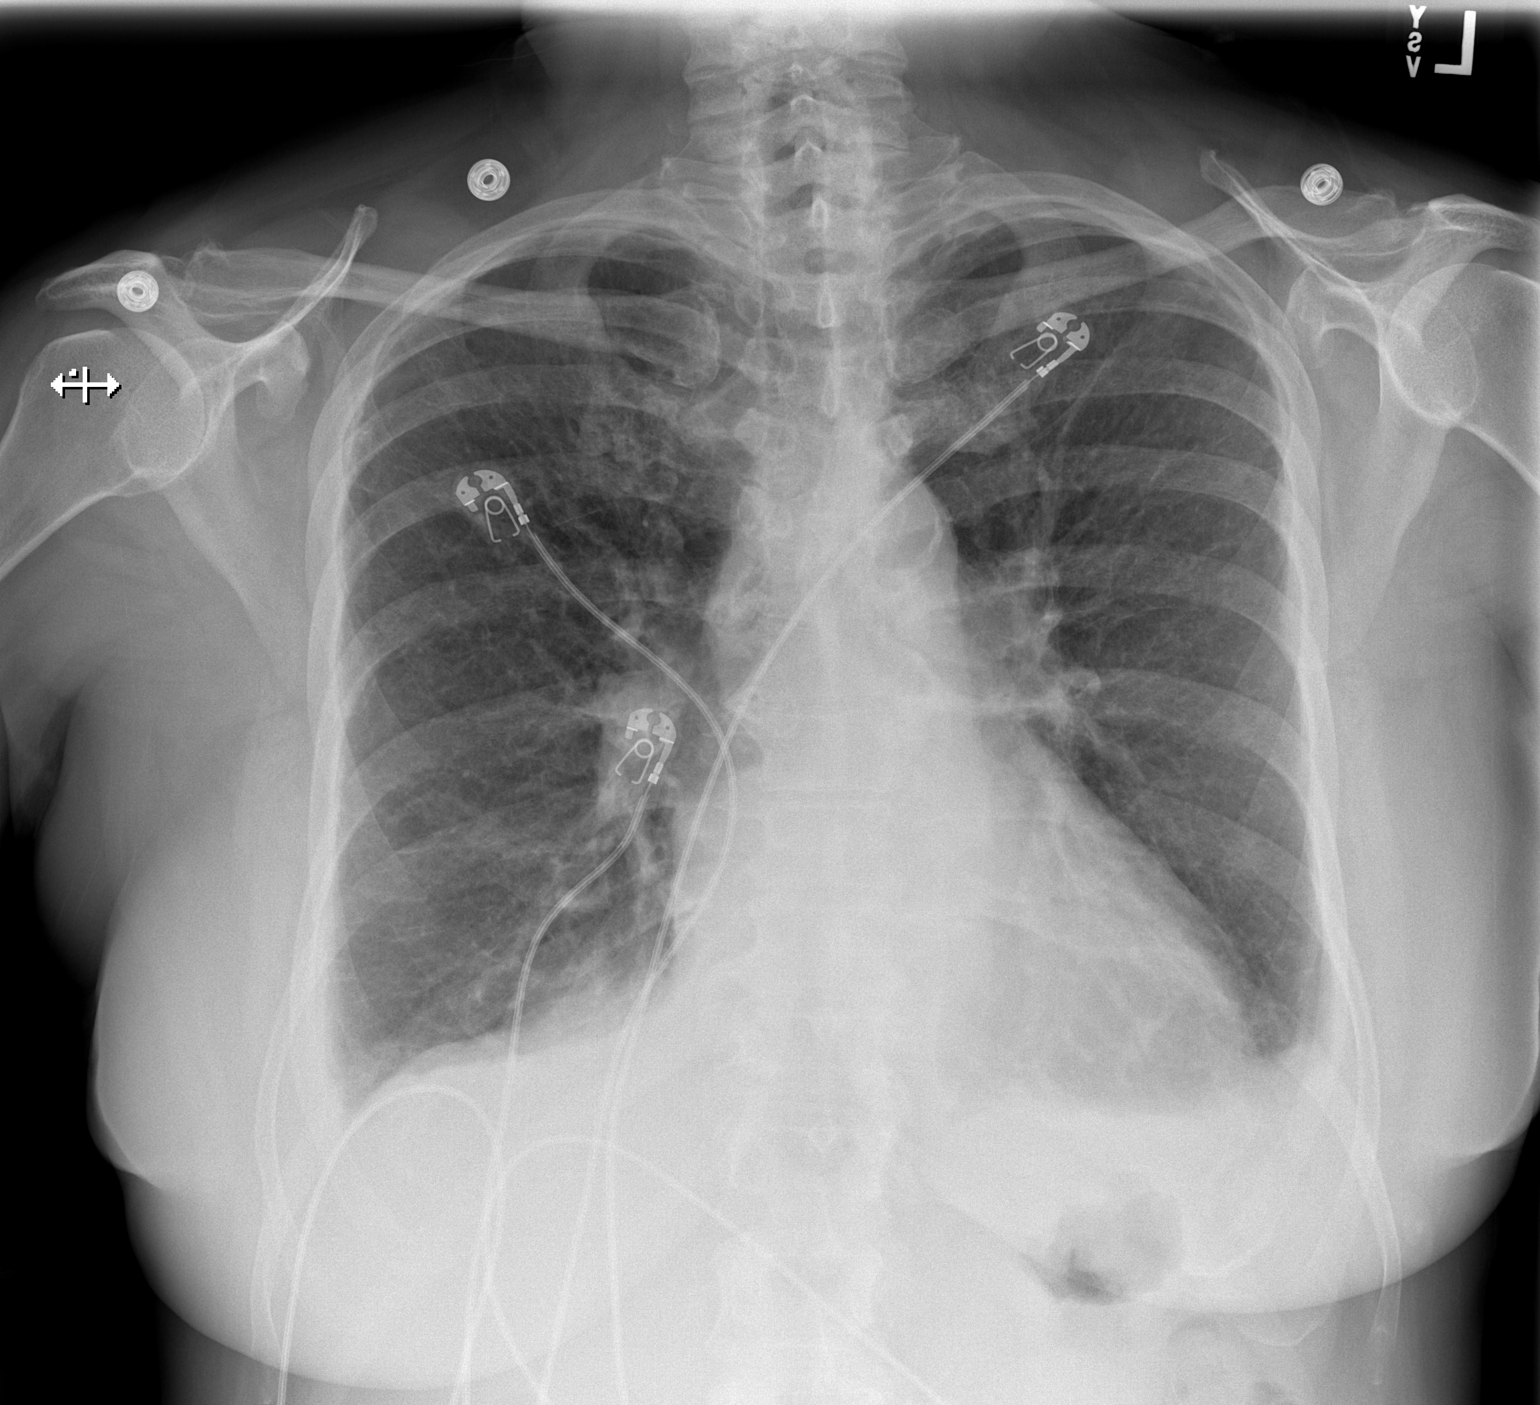

[w chest lat]
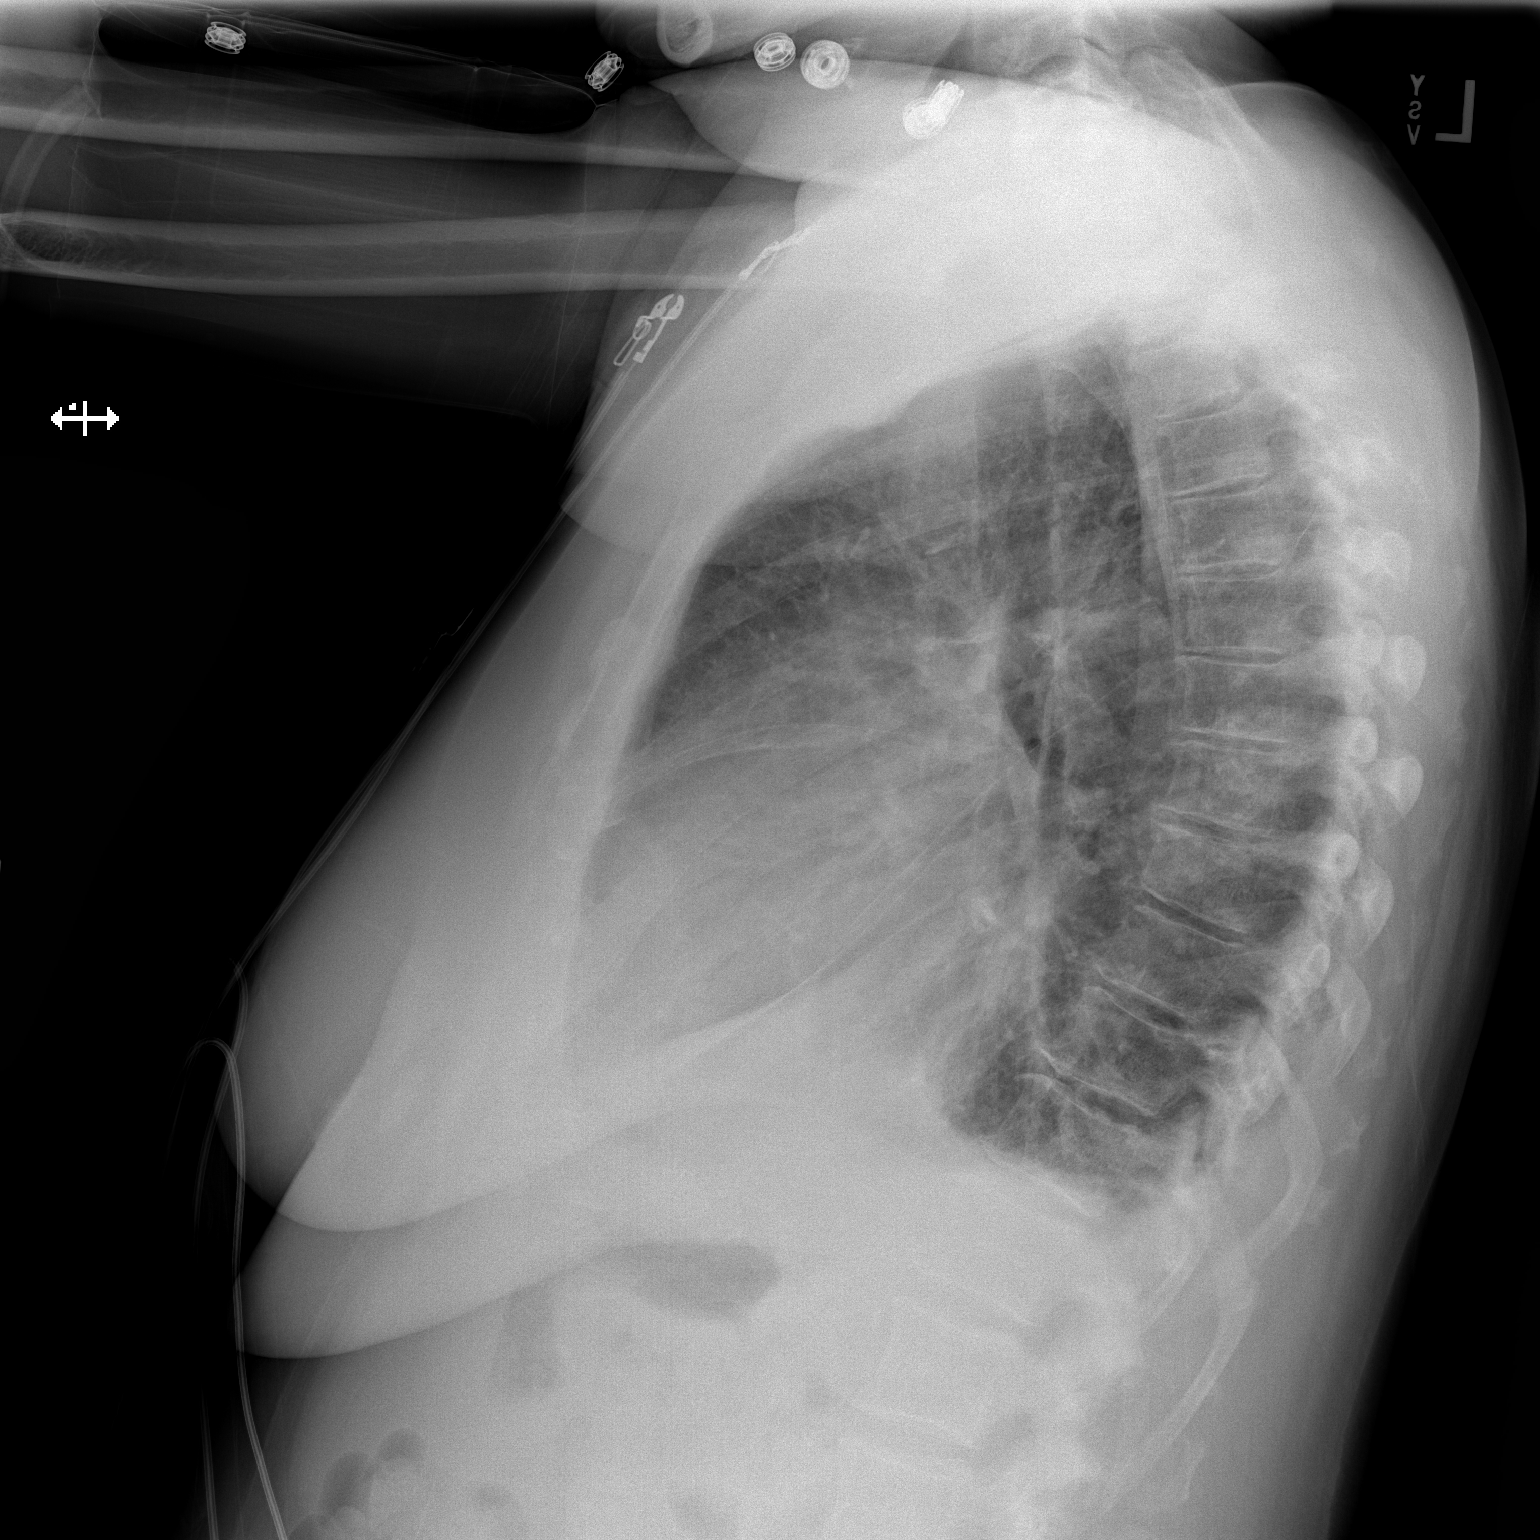

[2 of 2 positions shown; findings below may reference images not displayed]

FINDINGS: Diffuse peribronchial cuffing. Ill-defined opacity in the anterior
aspect of the left lower lobe, concerning for bronchopneumonia.
Trace bilateral pleural effusions. No evidence of pulmonary edema.
Heart size is normal. Upper mediastinal contours are within normal
limits. Aortic atherosclerosis.
IMPRESSION: 1. Diffuse peribronchial cuffing concerning for bronchitis, with
probable left lower lobe bronchopneumonia.
2. Trace bilateral pleural effusions.
3. Aortic atherosclerosis.

## 2018-12-03 MED ORDER — ACETAMINOPHEN 325 MG PO TABS: 650.0000 mg | ORAL_TABLET | ORAL | Status: DC | PRN

## 2018-12-03 MED ORDER — ONDANSETRON HCL 4 MG/2ML IJ SOLN: 4.0000 mg | Freq: Four times a day (QID) | INTRAMUSCULAR | Status: DC | PRN

## 2018-12-03 MED ORDER — SODIUM CHLORIDE 0.9 % IV BOLUS
1000.0000 mL | Freq: Once | INTRAVENOUS | Status: AC
  Administered 2018-12-03: 1000 mL via INTRAVENOUS

## 2018-12-03 MED ORDER — AZITHROMYCIN 250 MG PO TABS
500.0000 mg | ORAL_TABLET | Freq: Every day | ORAL | Status: DC
  Administered 2018-12-03: 500 mg via ORAL
  Filled 2018-12-03: qty 2

## 2018-12-03 MED ORDER — HEPARIN (PORCINE) 25000 UT/250ML-% IV SOLN
1100.0000 [IU]/h | INTRAVENOUS | Status: DC
  Administered 2018-12-03: 1100 [IU]/h via INTRAVENOUS
  Filled 2018-12-03: qty 250

## 2018-12-03 MED ORDER — DILTIAZEM LOAD VIA INFUSION
10.0000 mg | Freq: Once | INTRAVENOUS | Status: AC
  Administered 2018-12-03: 10 mg via INTRAVENOUS
  Filled 2018-12-03: qty 10

## 2018-12-03 MED ORDER — SODIUM CHLORIDE 0.9 % IV SOLN
1.0000 g | INTRAVENOUS | Status: DC
  Administered 2018-12-03: 1 g via INTRAVENOUS
  Filled 2018-12-03: qty 10

## 2018-12-03 MED ORDER — DILTIAZEM HCL-DEXTROSE 100-5 MG/100ML-% IV SOLN (PREMIX)
5.0000 mg/h | INTRAVENOUS | Status: DC
  Administered 2018-12-03: 5 mg/h via INTRAVENOUS
  Administered 2018-12-03: 10 mg/h via INTRAVENOUS
  Filled 2018-12-03 (×3): qty 100

## 2018-12-03 MED ORDER — APIXABAN 5 MG PO TABS
5.0000 mg | ORAL_TABLET | Freq: Two times a day (BID) | ORAL | Status: DC
  Administered 2018-12-04 – 2018-12-07 (×7): 5 mg via ORAL
  Filled 2018-12-03 (×8): qty 1

## 2018-12-03 MED ORDER — DM-GUAIFENESIN ER 30-600 MG PO TB12: 1.0000 | ORAL_TABLET | Freq: Two times a day (BID) | ORAL | 0 refills | Status: DC

## 2018-12-03 MED ORDER — APIXABAN 5 MG PO TABS
5.0000 mg | ORAL_TABLET | ORAL | Status: AC
  Administered 2018-12-03: 5 mg via ORAL
  Filled 2018-12-03: qty 1

## 2018-12-03 MED ORDER — ASPIRIN EC 81 MG PO TBEC
81.0000 mg | DELAYED_RELEASE_TABLET | Freq: Every day | ORAL | Status: DC
  Administered 2018-12-03: 81 mg via ORAL
  Filled 2018-12-03: qty 1

## 2018-12-03 MED ORDER — HEPARIN BOLUS VIA INFUSION
4000.0000 [IU] | Freq: Once | INTRAVENOUS | Status: AC
  Administered 2018-12-03: 4000 [IU] via INTRAVENOUS
  Filled 2018-12-03: qty 4000

## 2018-12-03 NOTE — H&P (Addendum)
[facility] Admission History and Physical Service Pager: [phone]  Patient name: Tracy James Medical record number: [id number] Date of birth: 1951-04-21 Age: 67 y.o. Gender: female  Primary Care Provider: Antony Blackbird, MD Consultants: Cardiology Code Status: Full  Chief Complaint: Shortness of breath for one week  Assessment and Plan: Tracy James is a 67 y.o. female presenting with 1 week of shortness of breath and fatigue. PMH is significant for HTN, heart murmur, allergies.  New onset A-fibrillation - unclear exactly when this may have started.  Patient reports shortness of breath and fatigue throughout the day starting about 1 week ago.  At no point patient notice palpitations or chest pain.  She has no prior history of A. fib.  Vitals on admission were remarkable for tachycardia in the 150s.  Blood pressures were elevated to 152/92.  EKG was remarkable for atrial fibrillation with rapid ventricular response.  Chest x-ray was remarkable for peribronchial thickening with questionable consolidation in the left lower lung.  The reason for new onset A. fib at this time is not perfectly clear, possible triggers include: recent upper respiratory infection, EtOH.  TSH normal. Other than new CAP no known respiratory disease, no known sleep apnea. Due to the unclear duration of her new onset A. fib, she was not cardioverted in the ED.  Cardiology was consulted, heparin drip started, dilt gtt started.  Will admit inpatient continue to work with cardiology. CHADS-VASC score of 3 so high risk of thromboembolism. No LE tenderness or pain, no unilateral swelling, no pleuritic chest pain. Wells score of of 1.5, low risk for PE. -Admit to family medicine teaching service, attending Dr. Nori Riis -Consult cardiology, appreciate recommendations -Continue heparin, start Eliquis -Continue telemetry -Cardiac monitoring -Trend troponins -Monitor CMP/CBC -Monitor vitals  AKI on  CKD stage IIIa -  Baseline creatinine appears to be 1.0-1.2.  Creatinine on admission 1.56 with a creatinine clearance of 38.8.  BUN 28, BUN/creatinine ratio is less than 20:1 suggesting that this is not likely prerenal. -Hold HCTZ -Hold losartan -Continue to monitor  Lower respiratory tract infection -she reports roughly 2-1/2 weeks of sinus congestion with postnasal drip.  Additionally she reports over 1 week of productive cough with clear sputum without blood.  1 week of fatigue and shortness of breath.  Chest x-ray shows left lower lobe bronchopneumonia and with diffuse peribronchial cuffing.  We will begin treatment for community-acquired pneumonia and test for viral infection.  Will consider discontinuation of antibiotics if further tests support a viral infection. -Respiratory viral panel -Procalcitonin -Strep pneumo Legionella urinary antigen -Ceftriaxone x1 -Azithromycin 500 daily (5 day course)  Chronic hypertension-she has a history of chronic hypertension and home medications include hydrochlorothiazide and losartan.  On admission her blood pressure was elevated with systolics as high as 833 slowly decreasing to the 110s and 130s.  We will hold anti-hypertensive medication at this time due to mild AKI. -Hold HCTZ -Hold losartan  FEN/GI: General diet, NPO at midnight Prophylaxis: Heparin  Disposition: DC home following assessment and workup for possible carioversion  History of Present Illness: Tracy James is a 67 y.o. female presenting with 1 week of shortness of breath and fatigue. PMH is significant for HTN, heart murmur, allergies.  She reports that she began to notice significant sinus drainage about 2-1/2 weeks ago.  She frequently has sinus congestion which is led to multiple episodes of sinusitis in the past.  This seemed to be like a normal episode of sinus drainage.  She had been trying home remedies including tea and Mucinex without significant improvement.  She  reports that she could feel the sinus drainage accumulating in her chest.  For a little over 1 week now, she has been experiencing a productive cough with clear sputum (no hemoptysis).  1 week ago, she began to experience significant shortness of breath with exertion and fatigue throughout the day.  She scheduled a primary care appointment to address the shortness of breath and fatigue throughout the day. She was able to take down all her Christmas decorations and clean the house.  On 12/31 she went to her PCP appointment where she was found to have A. fib.  At that time she was encouraged to go to the ED for further evaluation and treatment.  She denied any experience of chest pain, palpitations, nausea, vomiting, stomach pain, headaches, fever.  In the ED, EKG showed A. fib, cardiology was consulted, heparin was started along with a diltiazem drip.  Chest x-ray revealed peribronchial thickening potential left lower lobe consolidation.  Review Of Systems: Per HPI with the following additions:  Review of Systems  Constitutional: Negative for chills and fever.  HENT: Positive for congestion and sinus pain. Negative for ear pain and sore throat.   Eyes: Negative for blurred vision and double vision.  Respiratory: Positive for cough, sputum production and shortness of breath. Negative for hemoptysis and wheezing.   Cardiovascular: Positive for orthopnea. Negative for chest pain, palpitations, leg swelling and PND.  Gastrointestinal: Negative for abdominal pain, blood in stool, constipation, diarrhea, nausea and vomiting.  Genitourinary: Negative for dysuria and hematuria.  Musculoskeletal: Negative for myalgias.  Neurological: Negative for tremors and headaches.    Patient Active Problem List   Diagnosis Date Noted  . A-fib ([place]) 12/03/2018  . Precordial chest pain 07/19/2017  . Borderline diabetic 09/22/2016  . Overweight (BMI 25.0-29.9) 09/22/2016  . Aortic stenosis 09/22/2016  .  Hypertriglyceridemia 03/20/2016  . Essential hypertension, benign 09/21/2015    Past Medical History: Past Medical History:  Diagnosis Date  . Allergy    SEASONAL  . Heart murmur   . Hypertension     Past Surgical History: History reviewed. No pertinent surgical history.  Social History: Social History   Tobacco Use  . Smoking status: Former Smoker    Last attempt to quit: 12/05/2011    Years since quitting: 7.0  . Smokeless tobacco: Never Used  Substance Use Topics  . Alcohol use: Yes    Comment: occasionally  . Drug use: No   Additional social history: Drinks one glass of wine about once a week. 1/2ppd smoking history, quit over 7 years, no illicit drug use. Please also refer to relevant sections of EMR.  Family History: Family History  Problem Relation Age of Onset  . Arthritis Mother   . Cancer Mother        colon cancer  . Diabetes Mother   . Hypertension Mother   . Heart murmur Mother   . Cancer Brother        LUNG- smoking, [place]  . Early death Brother   . Heart murmur Brother   . Arthritis Maternal Grandmother   . Heart disease Maternal Grandmother        massive heart attack  . Heart disease Paternal Grandmother   . Asthma Brother   . Diabetes Brother   . Hypertension Brother   . Heart murmur Brother   . Heart disease Father        anuersym-  ruptered     Allergies and Medications: No Known Allergies No current facility-administered medications on file prior to encounter.    Current Outpatient Medications on File Prior to Encounter  Medication Sig Dispense Refill  . aspirin EC 81 MG tablet Take 81 mg by mouth daily.    . Calcium 600-200 MG-UNIT tablet Take 1 tablet by mouth daily.    . Capsicum, Cayenne, (CAYENNE PO) Take 1 tablet by mouth daily.     Marland Kitchen dextromethorphan-guaiFENesin (MUCINEX DM) 30-600 MG 12hr tablet Take 1 tablet by mouth 2 (two) times daily for 7 days. 14 tablet 0  . hydrochlorothiazide (HYDRODIURIL) 25 MG tablet Take 1  tablet (25 mg total) by mouth daily. 90 tablet 1  . hydrocortisone cream 0.5 % Apply 1 application topically 2 (two) times daily. As needed for itching 30 g 0  . loperamide (LOPERAMIDE A-D) 2 MG tablet Take 2 pills then one pill after any additional loose stools (Patient taking differently: Take 4 mg by mouth as needed for diarrhea or loose stools. Take 2 pills then one pill after any additional loose stools) 30 tablet 0  . losartan (COZAAR) 50 MG tablet Take 1 tablet (50 mg total) by mouth daily. 30 tablet 6  . nitroGLYCERIN (NITROSTAT) 0.4 MG SL tablet Place 1 tablet (0.4 mg total) under the tongue every 5 (five) minutes as needed for chest pain. 20 tablet 0  . Omega-3 1000 MG CAPS Take 1 capsule by mouth daily.     . Turmeric 500 MG TABS Take 1 tablet by mouth daily.     . silver sulfADIAZINE (SILVADENE) 1 % cream Apply 1 application topically daily. (Patient not taking: Reported on 12/03/2018) 50 g 0    Objective: BP (!) 128/101   Pulse 68   Temp 98 F (36.7 C) (Oral)   Resp 18   Ht 5\' 6"  (1.676 m)   Wt 82.1 kg   SpO2 98%   BMI 29.21 kg/m  Exam: Physical Exam Constitutional:      Appearance: Normal appearance. She is normal weight.  HENT:     Nose: Nose normal.     Mouth/Throat:     Mouth: Mucous membranes are moist.  Eyes:     Conjunctiva/sclera: Conjunctivae normal.  Neck:     Musculoskeletal: Normal range of motion and neck supple.  Cardiovascular:     Rate and Rhythm: Tachycardia present. Rhythm irregular.     Pulses: Normal pulses.     Heart sounds: Murmur present. Systolic murmur present with a grade of 1/6.  Pulmonary:     Effort: Pulmonary effort is normal. No respiratory distress.     Breath sounds: Normal breath sounds. No wheezing or rales.  Abdominal:     General: Abdomen is flat. Bowel sounds are normal. There is no distension.     Palpations: Abdomen is soft.     Tenderness: There is no abdominal tenderness.  Musculoskeletal:        General: No  tenderness.     Right lower leg: No edema.     Left lower leg: No edema.  Skin:    General: Skin is warm and dry.  Neurological:     General: No focal deficit present.     Mental Status: She is alert.     Labs and Imaging: CBC BMET  Recent Labs  Lab 12/03/18 1307  WBC 10.7*  HGB 14.0  HCT 43.4  PLT 282   Recent Labs  Lab 12/03/18 1307  NA 137  K 3.7  CL 104  CO2 21*  BUN 28*  CREATININE 1.56*  GLUCOSE 142*  CALCIUM 9.1     Dg Chest 2 View  Result Date: 12/03/2018 CLINICAL DATA:  67 year old female with history of cold symptoms for the past 2 weeks with shortness of breath for 1 week and cough for the past 3 days. EXAM: CHEST - 2 VIEW COMPARISON:  Chest x-ray 07/25/2017. FINDINGS: Diffuse peribronchial cuffing. Ill-defined opacity in the anterior aspect of the left lower lobe, concerning for bronchopneumonia. Trace bilateral pleural effusions. No evidence of pulmonary edema. Heart size is normal. Upper mediastinal contours are within normal limits. Aortic atherosclerosis. IMPRESSION: 1. Diffuse peribronchial cuffing concerning for bronchitis, with probable left lower lobe bronchopneumonia. 2. Trace bilateral pleural effusions. 3. Aortic atherosclerosis. Electronically Signed   By: [name] M.D.   On: 12/03/2018 13:45   Mm Diag Breast Tomo Bilateral  Result Date: 11/22/2018 CLINICAL DATA:  67 year old female with palpable abnormality in the right axilla. EXAM: DIGITAL DIAGNOSTIC BILATERAL MAMMOGRAM WITH CAD AND TOMO ULTRASOUND OF THE RIGHT AXILLA COMPARISON:  Previous exam(s). ACR Breast Density Category b: There are scattered areas of fibroglandular density. FINDINGS: No suspicious masses or calcifications are seen in either breast. Spot compression tangential tomograms were performed of the right axilla with no abnormalities seen. The lymph nodes in the right axilla appear mammographically stable dating back to at least 2007. Mammographic images were processed with  CAD. Physical examination of the right axilla does not reveal any palpable masses. Targeted ultrasound of the right axilla was performed. No suspicious masses or abnormality seen, only normal-appearing axillary lymph nodes identified. IMPRESSION: 1.  No findings of malignancy in either breast. 2. No abnormalities identified at the site of palpable concern in the right axilla. RECOMMENDATION: 1. Clinical follow-up as needed for right axillary palpable abnormality. 2.  Screening mammogram in one year.(Code:SM-B-01Y) I have discussed the findings and recommendations with the patient. Results were also provided in writing at the conclusion of the visit. If applicable, a reminder letter will be sent to the patient regarding the next appointment. BI-RADS CATEGORY  1: Negative. Electronically Signed   By: Everlean Alstrom M.D.   On: 11/22/2018 10:48   [place] Axilla Right  Result Date: 11/22/2018 CLINICAL DATA:  67 year old female with palpable abnormality in the right axilla. EXAM: DIGITAL DIAGNOSTIC BILATERAL MAMMOGRAM WITH CAD AND TOMO ULTRASOUND OF THE RIGHT AXILLA COMPARISON:  Previous exam(s). ACR Breast Density Category b: There are scattered areas of fibroglandular density. FINDINGS: No suspicious masses or calcifications are seen in either breast. Spot compression tangential tomograms were performed of the right axilla with no abnormalities seen. The lymph nodes in the right axilla appear mammographically stable dating back to at least 2007. Mammographic images were processed with CAD. Physical examination of the right axilla does not reveal any palpable masses. Targeted ultrasound of the right axilla was performed. No suspicious masses or abnormality seen, only normal-appearing axillary lymph nodes identified. IMPRESSION: 1.  No findings of malignancy in either breast. 2. No abnormalities identified at the site of palpable concern in the right axilla. RECOMMENDATION: 1. Clinical follow-up as needed for right  axillary palpable abnormality. 2.  Screening mammogram in one year.(Code:SM-B-01Y) I have discussed the findings and recommendations with the patient. Results were also provided in writing at the conclusion of the visit. If applicable, a reminder letter will be sent to the patient regarding the next appointment. BI-RADS CATEGORY  1: Negative. Electronically Signed   By: Anderson Malta  Shelly Bombard M.D.   On: 11/22/2018 10:48     Matilde Haymaker, MD 12/03/2018, 3:55 PM PGY-1, [facility] pager: [phone], text pages welcome  Resident Attestation  I saw and evaluated the patient, performing the key elements of the service. I personally performed or re-performed the history, physical exam, and medical decision making activities of this service and have verified that the service and findings are accurately documented in the note.I developed the management plan that is described in the note, and I agree with the content, with my edits above in red.   Harolyn Rutherford, DO [facility], PGY-2

## 2018-12-03 NOTE — ED Notes (Signed)
Main lab called. Pt has troponin of 0.03. MD Lita Mains notified

## 2018-12-03 NOTE — ED Notes (Signed)
PAGED ADMITTING PER RN CHRIS

## 2018-12-03 NOTE — Consult Note (Addendum)
Cardiology Consultation:   Patient ID: Tracy James MRN: [id number]; DOB: June 14, 1951  Admit date: 12/03/2018 Date of Consult: 12/03/2018  Primary Care Provider: Antony Blackbird, MD Primary Cardiologist: Sherren Mocha, MD  Primary Electrophysiologist:  None    Patient Profile:   Tracy James is a 67 y.o. female with a hx of hypertension, tobacco use and aortic stenosis who is being seen today for the evaluation of new onset atrial fibrillation at the request of Dr. Sherry Ruffing.  History of Present Illness:   [name] presented to the emergency room today after referral from her PCP for atrial fibrillation with RVR.  She reports that about 2 weeks ago she began having sinus drainage.  She treated it with a home remedy of red onions, lemon, garlic and green tea.  It did not improve so she added Mucinex.  She denies any cough.  About a week ago she developed some chest pressure and she felt like her cold was moving into her chest.  She also developed shortness of breath and activity intolerance.  She was eventually unable to complete her vacuuming tasks.  She called her PCP on Friday and made an appointment for today.  When she was seen at the PCP she was found to be in A. fib with RVR.  She was referred to the emergency department.  Prior to this episode the patient was active without any chest pressure or shortness of breath.  She used to swim for an hour 3 times a week but had to stop that due to a rash.  Since then she has been working out at [employer], walking on the treadmill, and walking in her neighborhood without any exertional symptoms.  She has noted some mild orthopnea and has been sleeping on 2 pillows for several months.  She has had no paroxysmal nocturnal dyspnea or edema.  She quit smoking about 5 years ago after a 20-year half pack per day smoking history.  She drinks 1 glass of wine on occasion, not even weekly.  She denies any other drug use.  She is a retired Restaurant manager, fast food  and prides herself on being very active.  She has a family history of heart murmurs in both her mother and father and 2 brothers.  Her father apparently died after a repair of a ruptured valve.  He denies any prior atrial fibrillation or irregular heartbeats.  [name] was seen in our office on 08/21/2017 by Richardson Dopp, PA for evaluation of aortic stenosis.  Echocardiogram showed a mean gradient of 35 mmHg, consistent with moderate aortic stenosis.  LVOT/AV ratio of 0.21 was suggestive of severe aortic stenosis.  The patient was not describing any symptoms consistent with symptomatic severe aortic stenosis at the time.  There was plan for her to have a repeat echocardiogram in 1 year and follow-up with Dr. Burt Knack in 6 months.  Neither echo nor follow-up were completed.  In the ED labs revealed acute kidney injury with creatinine of 1.56, normal potassium of 3.7.  Normal hemoglobin of 14.0.  Mildly elevated WBCs at 10.7.  Point-of-care troponin was 0.04 which is within normal range.  Past Medical History:  Diagnosis Date  . Allergy    SEASONAL  . Heart murmur   . Hypertension     History reviewed. No pertinent surgical history.   Home Medications:  Prior to Admission medications   Medication Sig Start Date End Date Taking? Authorizing Provider  Calcium 600-200 MG-UNIT tablet Take 1 tablet by mouth  daily.    [provider]  Capsicum, Cayenne, (CAYENNE PO) Take 1 tablet by mouth daily.     [provider]  dextromethorphan-guaiFENesin (MUCINEX DM) 30-600 MG 12hr tablet Take 1 tablet by mouth 2 (two) times daily for 7 days. 12/03/18 12/10/18  Gildardo Pounds, NP  hydrochlorothiazide (HYDRODIURIL) 25 MG tablet Take 1 tablet (25 mg total) by mouth daily. 08/09/18   Fulp, Cammie, MD  hydrocortisone cream 0.5 % Apply 1 application topically 2 (two) times daily. As needed for itching Patient not taking: Reported on 12/03/2018 10/07/18   Antony Blackbird, MD  loperamide (LOPERAMIDE  A-D) 2 MG tablet Take 2 pills then one pill after any additional loose stools Patient not taking: Reported on 12/03/2018 08/16/18   Fulp, Ander Gaster, MD  losartan (COZAAR) 50 MG tablet Take 1 tablet (50 mg total) by mouth daily. 08/09/18   Fulp, Cammie, MD  nitroGLYCERIN (NITROSTAT) 0.4 MG SL tablet Place 1 tablet (0.4 mg total) under the tongue every 5 (five) minutes as needed for chest pain. Patient not taking: Reported on 12/03/2018 07/24/17   Alycia Rossetti, MD  Omega-3 1000 MG CAPS Take 1 capsule by mouth daily.     [provider]  silver sulfADIAZINE (SILVADENE) 1 % cream Apply 1 application topically daily. 08/09/18   Fulp, Cammie, MD  Turmeric 500 MG TABS Take 1 tablet by mouth daily.     [provider]    Inpatient Medications: Scheduled Meds:  Continuous Infusions: . diltiazem (CARDIZEM) infusion 10 mg/hr (12/03/18 1455)  . heparin 1,100 Units/hr (12/03/18 1414)  . sodium chloride     PRN Meds:   Allergies:   No Known Allergies  Social History:   Social History   Socioeconomic History  . Marital status: Divorced    Spouse name: Not on file  . Number of children: Not on file  . Years of education: Not on file  . Highest education level: Not on file  Occupational History  . Not on file  Social Needs  . Financial resource strain: Not on file  . Food insecurity:    Worry: Not on file    Inability: Not on file  . Transportation needs:    Medical: Not on file    Non-medical: Not on file  Tobacco Use  . Smoking status: Former Smoker    Last attempt to quit: 12/05/2011    Years since quitting: 7.0  . Smokeless tobacco: Never Used  Substance and Sexual Activity  . Alcohol use: Yes    Comment: occasionally  . Drug use: No  . Sexual activity: Not Currently    Birth control/protection: None  Lifestyle  . Physical activity:    Days per week: Not on file    Minutes per session: Not on file  . Stress: Not on file  Relationships  . Social connections:      Talks on phone: Not on file    Gets together: Not on file    Attends religious service: Not on file    Active member of club or organization: Not on file    Attends meetings of clubs or organizations: Not on file    Relationship status: Not on file  . Intimate partner violence:    Fear of current or ex partner: Not on file    Emotionally abused: Not on file    Physically abused: Not on file    Forced sexual activity: Not on file  Other Topics Concern  . Not on  file  Social History Narrative  . Not on file    Family History:    Family History  Problem Relation Age of Onset  . Arthritis Mother   . Cancer Mother        colon cancer  . Diabetes Mother   . Hypertension Mother   . Heart murmur Mother   . Cancer Brother        LUNG- smoking, Abestos  . Early death Brother   . Heart murmur Brother   . Arthritis Maternal Grandmother   . Heart disease Maternal Grandmother        massive heart attack  . Heart disease Paternal Grandmother   . Asthma Brother   . Diabetes Brother   . Hypertension Brother   . Heart murmur Brother   . Heart disease Father        anuersym- ruptered     ROS:  Please see the history of present illness.   All other ROS reviewed and negative.     Physical Exam/Data:   Vitals:   12/03/18 1255 12/03/18 1415 12/03/18 1456 12/03/18 1501  BP: (!) 152/92 113/89  (!) 128/101  Pulse: (!) 157 (!) 119 (!) 45 68  Resp: (!) 22 20 (!) 26 18  Temp:      TempSrc:      SpO2: 100% 97% 100% 98%  Weight:      Height:       No intake or output data in the 24 hours ending 12/03/18 1510 Filed Weights   12/03/18 1249  Weight: 82.1 kg   Body mass index is 29.21 kg/m.  General:  Well nourished, well developed, in no acute distress HEENT: normal Lymph: no adenopathy Neck: no JVD Endocrine:  No thryomegaly Vascular: No carotid bruits; FA pulses 2+ bilaterally without bruits  Cardiac:  normal S1, S2; irregularly irregular rhythm, tachycardic; 2/6 systolic  murmur at RUSB Lungs:  clear to auscultation bilaterally, no wheezing, rhonchi or rales  Abd: soft, nontender, no hepatomegaly  Ext: no edema Musculoskeletal:  No deformities, BUE and BLE strength normal and equal Skin: warm and dry  Neuro:  CNs 2-12 intact, no focal abnormalities noted Psych:  Normal affect   EKG:  The EKG was personally reviewed and demonstrates: Atrial fibrillation with RVR with premature ventricular or aberrantly conducted complexes, 154 bpm Telemetry:  Telemetry was personally reviewed and demonstrates: Atrial fibrillation in the 120s  Relevant CV Studies:  Echo 08/01/17 Mod LVH, EF 60-65, no RWMA, Gr 2 DD, mod AS (mean 35, peak 60), LVOT/AV velocity ratio 0.21, trivial MR, mild LAE  Coronary CTA 07/25/17 IMPRESSION: 1. Coronary calcium score of 0. This was 0 percentile for age and sex matched control. 2. Normal coronary origin with right dominance. 3. No evidence of CAD. 4. Unusually thickened leaflets of the tricuspid aortic valve mild calcifications in the non-coronary cusp. Leaflets opening can't be evaluated as only diastolic images were obtained. An echocardiogram is recommended for further evaluation.  Chest CTA 07/19/17 IMPRESSION: 1. No thoracic aortic aneurysm or dissection. There are foci of calcification in the thoracic aorta and great vessels. There is mild calcification in the aortic valve. No evident pulmonary embolus. 2. There is a degree of centrilobular emphysematous change. No edema or consolidation. There is bibasilar atelectasis. 3. Single mildly prominent pretracheal lymph node, likely reactive in etiology. Aortic Atherosclerosis (ICD10-I70.0) and Emphysema (ICD10-J43.9).  Carotid US 12/14 There is no evidence of a hemodynamically significant carotid stenosis.   Laboratory Data:  Chemistry Recent  Labs  Lab 12/03/18 1307  NA 137  K 3.7  CL 104  CO2 21*  GLUCOSE 142*  BUN 28*  CREATININE 1.56*  CALCIUM 9.1  GFRNONAA 35*    GFRAA 40*  ANIONGAP 12    No results for input(s): PROT, ALBUMIN, AST, ALT, ALKPHOS, BILITOT in the last 168 hours. Hematology Recent Labs  Lab 12/03/18 1307  WBC 10.7*  RBC 4.38  HGB 14.0  HCT 43.4  MCV 99.1  MCH 32.0  MCHC 32.3  RDW 14.3  PLT 282   Cardiac EnzymesNo results for input(s): TROPONINI in the last 168 hours.  Recent Labs  Lab 12/03/18 1311  TROPIPOC 0.04    BNPNo results for input(s): BNP, PROBNP in the last 168 hours.  DDimer No results for input(s): DDIMER in the last 168 hours.  Radiology/Studies:  Dg Chest 2 View  Result Date: 12/03/2018 CLINICAL DATA:  67 year old female with history of cold symptoms for the past 2 weeks with shortness of breath for 1 week and cough for the past 3 days. EXAM: CHEST - 2 VIEW COMPARISON:  Chest x-ray 07/25/2017. FINDINGS: Diffuse peribronchial cuffing. Ill-defined opacity in the anterior aspect of the left lower lobe, concerning for bronchopneumonia. Trace bilateral pleural effusions. No evidence of pulmonary edema. Heart size is normal. Upper mediastinal contours are within normal limits. Aortic atherosclerosis. IMPRESSION: 1. Diffuse peribronchial cuffing concerning for bronchitis, with probable left lower lobe bronchopneumonia. 2. Trace bilateral pleural effusions. 3. Aortic atherosclerosis. Electronically Signed   By: [name] M.D.   On: 12/03/2018 13:45    Assessment and Plan:   1. New onset atrial fibrillation with RVR -Patient with complaints of upper respiratory infection for the past 2 weeks with progressively worsening dyspnea and activity intolerance.  She has likely been in A. fib for the last 1-2 weeks causing her symptoms. -Currently on diltiazem drip with rates in the 120s, titrate for rate control -Will check TSH -CHA2DS2/VAS Stroke Risk Score is at least 3 (HTN, Age, female).  She has been started on a heparin drip for anticoagulation.  Will switch to eliquis 5 mg PO BID.  -No evidence of CAD as  coronary CTA in 07/2017 with calcium score of 0 and normal coronary arteries. -Continue with rate control.  Plan to follow-up in our office in 3-4 weeks after initiation of anticoagulation.  If patient remains in atrial fibrillation, consider electrical cardioversion. -CXR shows diffuse peribronchial cuffing concerning for bronchitis, with probable left lower lobe bronchopneumonia. WBCs are mildly elevated. Respiratory infection may have triggered the afib. Will defer further treatment  Of respiratory infection to primary team.   2. Hypertension -Home management includes hydrochlorothiazide 25 mg daily, losartan 50 mg daily -Watch blood pressure with addition of IV Cardizem for A. fib rate control  3. Aortic stenosis -Moderate-severe aortic stenosis by echo in 07/2017.  She was planned for repeat echo in 08/2018 which was not done. -Until this episode, the patient was active with no chest discomfort or shortness of breath. She did have mild 2 pillow orthopnea but no peripheral edema. Consider worsened aortic stenosis.  -We will recheck echocardiogram tomorrow once heart rate is better controlled.   For questions or updates, please contact [place] Please consult [email or web address] for contact info under     Signed, Daune Perch, NP  12/03/2018 3:10 PM

## 2018-12-03 NOTE — Progress Notes (Addendum)
[place] for heparin --> apixaban Indication: atrial fibrillation  No Known Allergies  Patient Measurements: Height: 5\' 6"  (167.6 cm) Weight: 181 lb (82.1 kg) IBW/kg (Calculated) : 59.3 Heparin Dosing Weight: 76.5kg  Vital Signs: Temp: 98 F (36.7 C) (12/31 1226) Temp Source: Oral (12/31 1226) BP: 139/127 (12/31 1545) Pulse Rate: 71 (12/31 1545)  Labs: Recent Labs    12/03/18 1307  HGB 14.0  HCT 43.4  PLT 282  CREATININE 1.56*    Estimated Creatinine Clearance: 38.8 mL/min (A) (by C-G formula based on SCr of 1.56 mg/dL (H)).  Assessment: 67 yof presented to the ED with new onset afib. Pharmacy consulted to transition from IV heparin to apixaban. She is not on anticoagulation PTA. She meets criteria for full dose apixaban: >60 kg and <80 y/o. No bleeding noted, CBC is normal.  Plan:  Stop heparin drip Apixaban 5 mg PO bid Education prior to discharge Pharmacy signing off but will follow peripherally   Renold Genta, PharmD, BCPS Clinical Pharmacist Clinical phone for 12/03/2018 until 10p is [phone] 12/03/2018 4:30 PM  **Pharmacist phone directory can now be found on [email or web address] listed under [place]**

## 2018-12-03 NOTE — ED Triage Notes (Signed)
Pt presents to ED from home. Pt has had a chest cold for about 2 weeks. Pt states she developed a cough 3 days ago.

## 2018-12-03 NOTE — Progress Notes (Signed)
[name] was seen today for cough and nasal congestion.  Diagnoses and all orders for this visit:  New onset a-fib [facility]) EKG Sent to ED for further evaluation and treatment.  Upper respiratory symptom -     dextromethorphan-guaiFENesin (MUCINEX DM) 30-600 MG 12hr tablet; Take 1 tablet by mouth 2 (two) times daily for 7 days.      Patient has been counseled on age-appropriate routine health concerns for screening and prevention. These are reviewed and up-to-date. Referrals have been placed accordingly. Immunizations are up-to-date or declined.    Subjective:   Chief Complaint  Patient presents with  . Cough  . Nasal Congestion   HPI Tracy James 67 y.o. female presents to office today for symptoms of URI. Unfortunately EKG showed NEW ONSET Afib with RVR and she was instructed to go to the Emergency room. I have called [facility] ED and spoke with Raquel Sarna RN regarding patient's arrival. She currently denies chest pain however she does endorse chest pressure, shortness of breath and cough with congestion.    Upper Respiratory Infection: Patient complains of symptoms of a URI. Symptoms include runny nose,  congestion, cough and chest pressure. Onset of symptoms was several weeks ago, unchanged since that time.  Evaluation to date: none. Treatment to date: Mucinex, Cold and sinus, Red onions, lemon, garlic tea. She denies sinus pressure or fever.      Review of Systems  Constitutional: Negative for fever, malaise/fatigue and weight loss.  HENT: Positive for congestion. Negative for nosebleeds.   Eyes: Negative.  Negative for blurred vision, double vision and photophobia.  Respiratory: Positive for cough. Negative for shortness of breath.   Cardiovascular: Positive for chest pain (chest pressure). Negative for palpitations and leg swelling.  Gastrointestinal: Negative.  Negative for heartburn, nausea and vomiting.  Musculoskeletal: Negative.  Negative for myalgias.  Neurological:  Negative.  Negative for dizziness, focal weakness, seizures and headaches.  Psychiatric/Behavioral: Negative.  Negative for suicidal ideas.    Past Medical History:  Diagnosis Date  . Allergy    SEASONAL  . Heart murmur   . Hypertension     No past surgical history on file.  Family History  Problem Relation Age of Onset  . Arthritis Mother   . Cancer Mother        colon cancer  . Diabetes Mother   . Hypertension Mother   . Cancer Brother        LUNG- smoking, Abestos  . Early death Brother   . Arthritis Maternal Grandmother   . Heart disease Maternal Grandmother        massive heart attack  . Heart disease Paternal Grandmother   . Asthma Brother   . Diabetes Brother   . Hypertension Brother   . Heart disease Father        anuersym- ruptered    Social History Reviewed with no changes to be made today.   Outpatient Medications Prior to Visit  Medication Sig Dispense Refill  . Calcium 600-200 MG-UNIT tablet Take 1 tablet by mouth daily.    . Capsicum, Cayenne, (CAYENNE PO) Take 1 tablet by mouth daily.     . hydrochlorothiazide (HYDRODIURIL) 25 MG tablet Take 1 tablet (25 mg total) by mouth daily. 90 tablet 1  . losartan (COZAAR) 50 MG tablet Take 1 tablet (50 mg total) by mouth daily. 30 tablet 6  . Omega-3 1000 MG CAPS Take 1 capsule by mouth daily.     . silver sulfADIAZINE (SILVADENE) 1 % cream  Apply 1 application topically daily. 50 g 0  . Turmeric 500 MG TABS Take 1 tablet by mouth daily.     . hydrocortisone cream 0.5 % Apply 1 application topically 2 (two) times daily. As needed for itching (Patient not taking: Reported on 12/03/2018) 30 g 0  . loperamide (LOPERAMIDE A-D) 2 MG tablet Take 2 pills then one pill after any additional loose stools (Patient not taking: Reported on 12/03/2018) 30 tablet 0  . nitroGLYCERIN (NITROSTAT) 0.4 MG SL tablet Place 1 tablet (0.4 mg total) under the tongue every 5 (five) minutes as needed for chest pain. (Patient not taking:  Reported on 12/03/2018) 20 tablet 0   No facility-administered medications prior to visit.     No Known Allergies     Objective:    BP 115/77   Ht 5\' 6"  (1.676 m)   Wt 181 lb (82.1 kg)   BMI 29.21 kg/m  Wt Readings from Last 3 Encounters:  12/03/18 181 lb (82.1 kg)  10/04/18 174 lb 3.2 oz (79 kg)  08/16/18 175 lb 3.2 oz (79.5 kg)    Physical Exam Vitals signs and nursing note reviewed.  Constitutional:      Appearance: She is well-developed.  HENT:     Head: Normocephalic and atraumatic.  Neck:     Musculoskeletal: Normal range of motion.  Cardiovascular:     Rate and Rhythm: Tachycardia present. Rhythm irregular.     Heart sounds: Normal heart sounds. No murmur. No friction rub. No gallop.   Pulmonary:     Effort: Pulmonary effort is normal. No tachypnea or respiratory distress.     Breath sounds: No decreased breath sounds, wheezing, rhonchi or rales.  Chest:     Chest wall: No tenderness.  Abdominal:     General: Bowel sounds are normal.     Palpations: Abdomen is soft.  Musculoskeletal: Normal range of motion.  Skin:    General: Skin is warm and dry.  Neurological:     Mental Status: She is alert and oriented to person, place, and time.     Coordination: Coordination normal.  Psychiatric:        Behavior: Behavior normal. Behavior is cooperative.        Thought Content: Thought content normal.        Judgment: Judgment normal.       Patient has been counseled extensively about nutrition and exercise as well as the importance of adherence with medications and regular follow-up. The patient was given clear instructions to go to ER or return to medical center if symptoms don't improve, worsen or new problems develop. The patient verbalized understanding.   Follow-up: Return if symptoms worsen or fail to improve.   Gildardo Pounds, FNP-BC [facility] and [place], [place]   12/03/2018, 12:19 PM

## 2018-12-03 NOTE — ED Notes (Signed)
Called main lab to add on magnesium ?

## 2018-12-03 NOTE — ED Provider Notes (Signed)
[facility] EMERGENCY DEPARTMENT Provider Note   CSN: [id number] Arrival date & time: 12/03/18  1224     History   Chief Complaint No chief complaint on file.   HPI Tracy James is a 67 y.o. female.  The history is provided by the patient and medical records. No language interpreter was used.  Shortness of Breath  This is a new problem. The average episode lasts 2 weeks. The problem occurs continuously.The current episode started more than 1 week ago. The problem has not changed since onset.Associated symptoms include chest pain. Pertinent negatives include no fever, no headaches, no rhinorrhea, no neck pain, no cough, no sputum production, no hemoptysis, no wheezing, no syncope, no vomiting, no abdominal pain, no rash, no leg pain and no leg swelling. She has tried nothing for the symptoms. The treatment provided no relief.    Past Medical History:  Diagnosis Date  . Allergy    SEASONAL  . Heart murmur   . Hypertension     Patient Active Problem List   Diagnosis Date Noted  . Precordial chest pain 07/19/2017  . Borderline diabetic 09/22/2016  . Overweight (BMI 25.0-29.9) 09/22/2016  . Aortic stenosis 09/22/2016  . Hypertriglyceridemia 03/20/2016  . Essential hypertension, benign 09/21/2015    History reviewed. No pertinent surgical history.   OB History   No obstetric history on file.      Home Medications    Prior to Admission medications   Medication Sig Start Date End Date Taking? Authorizing Provider  Calcium 600-200 MG-UNIT tablet Take 1 tablet by mouth daily.    [provider]  Capsicum, Cayenne, (CAYENNE PO) Take 1 tablet by mouth daily.     [provider]  dextromethorphan-guaiFENesin (MUCINEX DM) 30-600 MG 12hr tablet Take 1 tablet by mouth 2 (two) times daily for 7 days. 12/03/18 12/10/18  Gildardo Pounds, NP  hydrochlorothiazide (HYDRODIURIL) 25 MG tablet Take 1 tablet (25 mg total) by mouth daily. 08/09/18    Fulp, Cammie, MD  hydrocortisone cream 0.5 % Apply 1 application topically 2 (two) times daily. As needed for itching Patient not taking: Reported on 12/03/2018 10/07/18   Antony Blackbird, MD  loperamide (LOPERAMIDE A-D) 2 MG tablet Take 2 pills then one pill after any additional loose stools Patient not taking: Reported on 12/03/2018 08/16/18   Fulp, Ander Gaster, MD  losartan (COZAAR) 50 MG tablet Take 1 tablet (50 mg total) by mouth daily. 08/09/18   Fulp, Cammie, MD  nitroGLYCERIN (NITROSTAT) 0.4 MG SL tablet Place 1 tablet (0.4 mg total) under the tongue every 5 (five) minutes as needed for chest pain. Patient not taking: Reported on 12/03/2018 07/24/17   Alycia Rossetti, MD  Omega-3 1000 MG CAPS Take 1 capsule by mouth daily.     [provider]  silver sulfADIAZINE (SILVADENE) 1 % cream Apply 1 application topically daily. 08/09/18   Fulp, Cammie, MD  Turmeric 500 MG TABS Take 1 tablet by mouth daily.     [provider]    Family History Family History  Problem Relation Age of Onset  . Arthritis Mother   . Cancer Mother        colon cancer  . Diabetes Mother   . Hypertension Mother   . Cancer Brother        LUNG- smoking, [place]  . Early death Brother   . Arthritis Maternal Grandmother   . Heart disease Maternal Grandmother        massive heart  attack  . Heart disease Paternal Grandmother   . Asthma Brother   . Diabetes Brother   . Hypertension Brother   . Heart disease Father        anuersym- ruptered    Social History Social History   Tobacco Use  . Smoking status: Former Smoker    Last attempt to quit: 12/05/2011    Years since quitting: 7.0  . Smokeless tobacco: Never Used  Substance Use Topics  . Alcohol use: Yes    Comment: occasionally  . Drug use: No     Allergies   Patient has no known allergies.   Review of Systems Review of Systems  Constitutional: Positive for fatigue. Negative for chills, diaphoresis and fever.  HENT: Negative for  congestion and rhinorrhea.   Eyes: Negative for visual disturbance.  Respiratory: Positive for chest tightness and shortness of breath. Negative for cough, hemoptysis, sputum production, wheezing and stridor.   Cardiovascular: Positive for chest pain. Negative for palpitations, leg swelling and syncope.  Gastrointestinal: Negative for abdominal pain, constipation, diarrhea, nausea and vomiting.  Genitourinary: Negative for dysuria and flank pain.  Musculoskeletal: Negative for back pain, neck pain and neck stiffness.  Skin: Negative for rash and wound.  Neurological: Negative for light-headedness and headaches.  Psychiatric/Behavioral: Negative for agitation.  All other systems reviewed and are negative.    Physical Exam Updated Vital Signs BP (!) 152/92 (BP Location: Right Arm)   Pulse (!) 157   Temp 98 F (36.7 C) (Oral)   Resp (!) 22   Ht 5\' 6"  (1.676 m)   Wt 82.1 kg   SpO2 100%   BMI 29.21 kg/m   Physical Exam Vitals signs and nursing note reviewed.  Constitutional:      General: She is not in acute distress.    Appearance: She is well-developed. She is not ill-appearing or toxic-appearing.  HENT:     Head: Normocephalic and atraumatic.     Nose: No congestion or rhinorrhea.     Mouth/Throat:     Pharynx: No oropharyngeal exudate or posterior oropharyngeal erythema.  Eyes:     Conjunctiva/sclera: Conjunctivae normal.  Neck:     Musculoskeletal: Neck supple. No neck rigidity or muscular tenderness.  Cardiovascular:     Rate and Rhythm: Tachycardia present. Rhythm irregular.     Heart sounds: No murmur.  Pulmonary:     Effort: Pulmonary effort is normal. No respiratory distress.     Breath sounds: Normal breath sounds. No wheezing, rhonchi or rales.  Chest:     Chest wall: No tenderness.  Abdominal:     General: There is no distension.     Palpations: Abdomen is soft.     Tenderness: There is no abdominal tenderness.  Musculoskeletal:        General: No  swelling.     Right lower leg: No edema.     Left lower leg: No edema.  Skin:    General: Skin is warm and dry.     Capillary Refill: Capillary refill takes less than 2 seconds.     Coloration: Skin is not jaundiced.  Neurological:     General: No focal deficit present.     Mental Status: She is alert and oriented to person, place, and time.     Cranial Nerves: No cranial nerve deficit.     Sensory: No sensory deficit.     Motor: No weakness.      ED Treatments / Results  Labs (all labs ordered are  listed, but only abnormal results are displayed) Labs Reviewed  BASIC METABOLIC PANEL - Abnormal; Notable for the following components:      Result Value   CO2 21 (*)    Glucose, Bld 142 (*)    BUN 28 (*)    Creatinine, Ser 1.56 (*)    GFR calc non Af Amer 35 (*)    GFR calc Af Amer 40 (*)    All other components within normal limits  CBC - Abnormal; Notable for the following components:   WBC 10.7 (*)    All other components within normal limits  URINE CULTURE  MAGNESIUM  URINALYSIS, ROUTINE W REFLEX MICROSCOPIC  HEPARIN LEVEL (UNFRACTIONATED)  TSH  I-STAT TROPONIN, ED    EKG EKG Interpretation  Date/Time:  Tuesday December 03 2018 12:33:17 EST Ventricular Rate:  154 PR Interval:    QRS Duration: 66 QT Interval:  312 QTC Calculation: 499 R Axis:   97 Text Interpretation:  Atrial fibrillation with rapid ventricular response with premature ventricular or aberrantly conducted complexes Anterolateral infarct , age undetermined Abnormal ECG When compared to prior, new Afib with RVR.  No STEMI Confirmed by Antony Blackbird [phone]) on 12/03/2018 12:52:46 PM Also confirmed by Antony Blackbird [phone]), editor Philomena Doheny [phone])  on 12/03/2018 2:43:49 PM   Radiology Dg Chest 2 View  Result Date: 12/03/2018 CLINICAL DATA:  67 year old female with history of cold symptoms for the past 2 weeks with shortness of breath for 1 week and cough for the past 3 days. EXAM: CHEST - 2  VIEW COMPARISON:  Chest x-ray 07/25/2017. FINDINGS: Diffuse peribronchial cuffing. Ill-defined opacity in the anterior aspect of the left lower lobe, concerning for bronchopneumonia. Trace bilateral pleural effusions. No evidence of pulmonary edema. Heart size is normal. Upper mediastinal contours are within normal limits. Aortic atherosclerosis. IMPRESSION: 1. Diffuse peribronchial cuffing concerning for bronchitis, with probable left lower lobe bronchopneumonia. 2. Trace bilateral pleural effusions. 3. Aortic atherosclerosis. Electronically Signed   By: [name] M.D.   On: 12/03/2018 13:45    Procedures Procedures (including critical care time)  CRITICAL CARE Performed by: Gwenyth Allegra  Total critical care time: 35 minutes Critical care time was exclusive of separately billable procedures and treating other patients. Critical care was necessary to treat or prevent imminent or life-threatening deterioration. Critical care was time spent personally by me on the following activities: development of treatment plan with patient and/or surrogate as well as nursing, discussions with consultants, evaluation of patient's response to treatment, examination of patient, obtaining history from patient or surrogate, ordering and performing treatments and interventions, ordering and review of laboratory studies, ordering and review of radiographic studies, pulse oximetry and re-evaluation of patient's condition.   Medications Ordered in ED Medications  sodium chloride 0.9 % bolus 1,000 mL (has no administration in time range)  diltiazem (CARDIZEM) 1 mg/mL load via infusion 10 mg (10 mg Intravenous Bolus from Bag 12/03/18 1413)    And  diltiazem (CARDIZEM) 100 mg in dextrose 5% 11mL (1 mg/mL) infusion (10 mg/hr Intravenous Rate/Dose Change 12/03/18 1455)  heparin ADULT infusion 100 units/mL (25000 units/264mL sodium chloride 0.45%) (1,100 Units/hr Intravenous New Bag/Given 12/03/18 1414)    heparin bolus via infusion 4,000 Units (4,000 Units Intravenous Bolus from Bag 12/03/18 1415)     Initial Impression / Assessment and Plan / ED Course  I have reviewed the triage vital signs and the nursing notes.  Pertinent labs & imaging results that were available during my care of the patient  were reviewed by me and considered in my medical decision making (see chart for details).     Tracy James is a 67 y.o. female with a past medical history significant for hypertension, hypertriglyceridemia, and aortic stenosis who presents with exertional shortness of breath and exertional chest pressure.  Patient reports that she been feeling very fatigued for the last 2 weeks and has had exertional shortness breath and chest pressure.  She is never had this before.  She says she is never had a history of arrhythmia or atrial fibrillation.  She reports no significant nausea, vomiting, constipation, diarrhea, diaphoresis, dysuria.  No fevers or chills.  She denies any palpitations and cannot feel her heart racing.  In triage, patient was found to have a heart rate between 150 and 160.  EKG shows A. fib with RVR with no evidence of STEMI.  On exam, lungs are clear and chest is nontender.  Abdomen is nontender.  Patient has no significant edema in her legs.  Given her lack of clear onset, her exertional symptoms for the last 2 weeks suspect patient has been in an arrhythmia for the last few weeks.  Do not feel she is safe for ED cardioversion.  Cardiology was called who report that they will see the patient in consultation with patient will need to be admitted to medicine service.  Patient was given a diltiazem bolus and drip and will also be started on heparin.  Laboratory testing began to return showing mild leukocytosis.  Troponin was negative.  Magnesium was normal.  Metabolic panel shows increase in creatinine up to 1.56.  Normal potassium.  Chest x-ray shows concern for possible bronchitis  versus mild bronchopneumonia.  When I spoke to the admitting team, they elected to see the patient rather than start her on antibiotics at this time.  Will defer the antibiotic choice to them.  Patient was admitted for further management of new A. fib with RVR.    Final Clinical Impressions(s) / ED Diagnoses   Final diagnoses:  Exertional chest pain  Exertional shortness of breath  Atrial fibrillation with RVR [facility])    ED Discharge Orders    None     Clinical Impression: 1. Exertional chest pain   2. Exertional shortness of breath   3. Atrial fibrillation with RVR ([place])     Disposition: Admit  This note was prepared with assistance of Dragon voice recognition software. Occasional wrong-word or sound-a-like substitutions may have occurred due to the inherent limitations of voice recognition software.     , Gwenyth Allegra, MD 12/03/18 1534

## 2018-12-03 NOTE — Progress Notes (Signed)
ANTICOAGULATION CONSULT NOTE - Initial Consult  Pharmacy Consult for heparin Indication: atrial fibrillation  No Known Allergies  Patient Measurements: Height: 5\' 6"  (167.6 cm) Weight: 181 lb (82.1 kg) IBW/kg (Calculated) : 59.3 Heparin Dosing Weight: 76.5kg  Vital Signs: Temp: 98 F (36.7 C) (12/31 1226) Temp Source: Oral (12/31 1226) BP: 152/92 (12/31 1255) Pulse Rate: 157 (12/31 1255)  Labs: Recent Labs    12/03/18 1307  HGB 14.0  HCT 43.4  PLT 282  CREATININE 1.56*    Estimated Creatinine Clearance: 38.8 mL/min (A) (by C-G formula based on SCr of 1.56 mg/dL (H)).   Medical History: Past Medical History:  Diagnosis Date  . Allergy    SEASONAL  . Heart murmur   . Hypertension     Medications:  Infusions:  . diltiazem (CARDIZEM) infusion    . heparin    . sodium chloride      Assessment: 78 yof presented to the ED with new onset afib. To start IV heparin. Baseline CBC is WNL and she is not on anticoagulation PTA.   Goal of Therapy:  Heparin level 0.3-0.7 units/ml Monitor platelets by anticoagulation protocol: Yes   Plan:  Heparin bolus 4000 units IV x 1 Heparin gtt 1100 units/hr Check a 6 hr heparin level Daily heparin level and CBC  Meryn Sarracino, Rande Lawman 12/03/2018,2:07 PM

## 2018-12-04 ENCOUNTER — Inpatient Hospital Stay (HOSPITAL_COMMUNITY): Payer: Medicare Other

## 2018-12-04 DIAGNOSIS — I340 Nonrheumatic mitral (valve) insufficiency: Secondary | ICD-10-CM

## 2018-12-04 DIAGNOSIS — J988 Other specified respiratory disorders: Secondary | ICD-10-CM

## 2018-12-04 DIAGNOSIS — I4819 Other persistent atrial fibrillation: Secondary | ICD-10-CM

## 2018-12-04 DIAGNOSIS — B9789 Other viral agents as the cause of diseases classified elsewhere: Secondary | ICD-10-CM

## 2018-12-04 DIAGNOSIS — I4891 Unspecified atrial fibrillation: Secondary | ICD-10-CM

## 2018-12-04 LAB — RESPIRATORY PANEL BY PCR
Adenovirus: NOT DETECTED
Bordetella pertussis: NOT DETECTED
CORONAVIRUS 229E-RVPPCR: NOT DETECTED
CORONAVIRUS NL63-RVPPCR: NOT DETECTED
Chlamydophila pneumoniae: NOT DETECTED
Coronavirus HKU1: NOT DETECTED
Coronavirus OC43: NOT DETECTED
Influenza A: NOT DETECTED
Influenza B: NOT DETECTED
Metapneumovirus: NOT DETECTED
Mycoplasma pneumoniae: NOT DETECTED
Parainfluenza Virus 1: NOT DETECTED
Parainfluenza Virus 2: NOT DETECTED
Parainfluenza Virus 3: NOT DETECTED
Parainfluenza Virus 4: NOT DETECTED
Respiratory Syncytial Virus: NOT DETECTED
Rhinovirus / Enterovirus: NOT DETECTED

## 2018-12-04 LAB — CBC
HCT: 40 % (ref 36.0–46.0)
Hemoglobin: 12.8 g/dL (ref 12.0–15.0)
MCH: 30.6 pg (ref 26.0–34.0)
MCHC: 32 g/dL (ref 30.0–36.0)
MCV: 95.7 fL (ref 80.0–100.0)
Platelets: 264 10*3/uL (ref 150–400)
RBC: 4.18 MIL/uL (ref 3.87–5.11)
RDW: 14.4 % (ref 11.5–15.5)
WBC: 10.1 10*3/uL (ref 4.0–10.5)
nRBC: 0 % (ref 0.0–0.2)

## 2018-12-04 LAB — BASIC METABOLIC PANEL
Anion gap: 13 (ref 5–15)
BUN: 31 mg/dL — ABNORMAL HIGH (ref 8–23)
CALCIUM: 9 mg/dL (ref 8.9–10.3)
CO2: 18 mmol/L — ABNORMAL LOW (ref 22–32)
Chloride: 107 mmol/L (ref 98–111)
Creatinine, Ser: 1.35 mg/dL — ABNORMAL HIGH (ref 0.44–1.00)
GFR calc Af Amer: 48 mL/min — ABNORMAL LOW (ref >60)
GFR calc non Af Amer: 41 mL/min — ABNORMAL LOW (ref >60)
Glucose, Bld: 137 mg/dL — ABNORMAL HIGH (ref 70–99)
Potassium: 4.1 mmol/L (ref 3.5–5.1)
Sodium: 138 mmol/L (ref 135–145)

## 2018-12-04 LAB — URINE CULTURE

## 2018-12-04 LAB — LEGIONELLA PNEUMOPHILA SEROGP 1 UR AG: L. pneumophila Serogp 1 Ur Ag: NEGATIVE

## 2018-12-04 LAB — ECHOCARDIOGRAM COMPLETE
Height: 66 in
Weight: 2896 oz

## 2018-12-04 LAB — HIV ANTIBODY (ROUTINE TESTING W REFLEX): HIV Screen 4th Generation wRfx: NONREACTIVE

## 2018-12-04 LAB — TROPONIN I
Troponin I: 0.03 ng/mL (ref <0.03)
Troponin I: 0.03 ng/mL (ref <0.03)

## 2018-12-04 MED ORDER — METOPROLOL TARTRATE 25 MG PO TABS
25.0000 mg | ORAL_TABLET | Freq: Two times a day (BID) | ORAL | Status: DC
  Administered 2018-12-04 – 2018-12-06 (×5): 25 mg via ORAL
  Filled 2018-12-04 (×5): qty 1

## 2018-12-04 MED ORDER — DILTIAZEM HCL ER COATED BEADS 180 MG PO CP24
300.0000 mg | ORAL_CAPSULE | Freq: Every day | ORAL | Status: DC
  Administered 2018-12-04 – 2018-12-07 (×4): 300 mg via ORAL
  Filled 2018-12-04 (×4): qty 1

## 2018-12-04 NOTE — Discharge Instructions (Addendum)
Atrial Fibrillation ° °Atrial fibrillation is a type of heartbeat that is irregular or fast (rapid). If you have this condition, your heart beats without any order. This makes it hard for your heart to pump blood in a normal way. Having this condition gives you more risk for stroke, heart failure, and other heart problems. °Atrial fibrillation may start all of a sudden and then stop on its own, or it may become a long-lasting problem. °What are the causes? °This condition may be caused by heart conditions, such as: °· High blood pressure. °· Heart failure. °· Heart valve disease. °· Heart surgery. °Other causes include: °· Pneumonia. °· Obstructive sleep apnea. °· Lung cancer. °· Thyroid disease. °· Drinking too much alcohol. °Sometimes the cause is not known. °What increases the risk? °You are more likely to develop this condition if: °· You smoke. °· You are older. °· You have diabetes. °· You are overweight. °· You have a family history of this condition. °· You exercise often and hard. °What are the signs or symptoms? °Common symptoms of this condition include: °· A feeling like your heart is beating very fast. °· Chest pain. °· Feeling short of breath. °· Feeling light-headed or weak. °· Getting tired easily. °Follow these instructions at home: °Medicines °· Take over-the-counter and prescription medicines only as told by your doctor. °· If your doctor gives you a blood-thinning medicine, take it exactly as told. Taking too much of it can cause bleeding. Taking too little of it does not protect you against clots. Clots can cause a stroke. °Lifestyle ° °  ° °· Do not use any tobacco products. These include cigarettes, chewing tobacco, and e-cigarettes. If you need help quitting, ask your doctor. °· Do not drink alcohol. °· Do not drink beverages that have caffeine. These include coffee, soda, and tea. °· Follow diet instructions as told by your doctor. °· Exercise regularly as told by your doctor. °General  instructions °· If you have a condition that causes breathing to stop for a short period of time (apnea), treat it as told by your doctor. °· Keep a healthy weight. Do not use diet pills unless your doctor says they are safe for you. Diet pills may make heart problems worse. °· Keep all follow-up visits as told by your doctor. This is important. °Contact a doctor if: °· You notice a change in the speed, rhythm, or strength of your heartbeat. °· You are taking a blood-thinning medicine and you see more bruising. °· You get tired more easily when you move or exercise. °· You have a sudden change in weight. °Get help right away if: ° °· You have pain in your chest or your belly (abdomen). °· You have trouble breathing. °· You have blood in your vomit, poop, or pee (urine). °· You have any signs of a stroke. "BE FAST" is an easy way to remember the main warning signs: °? B - Balance. Signs are dizziness, sudden trouble walking, or loss of balance. °? E - Eyes. Signs are trouble seeing or a change in how you see. °? F - Face. Signs are sudden weakness or loss of feeling in the face, or the face or eyelid drooping on one side. °? A - Arms. Signs are weakness or loss of feeling in an arm. This happens suddenly and usually on one side of the body. °? S - Speech. Signs are sudden trouble speaking, slurred speech, or trouble understanding what people say. °? T - Time.   Time to call emergency services. Write down what time symptoms started.  You have other signs of a stroke, such as: ? A sudden, very bad headache with no known cause. ? Feeling sick to your stomach (nausea). ? Throwing up (vomiting). ? Jerky movements you cannot control (seizure). These symptoms may be an emergency. Do not wait to see if the symptoms will go away. Get medical help right away. Call your local emergency services (911 in the [place] Do not drive yourself to the hospital. Summary  Atrial fibrillation is a type of heartbeat that is irregular  or fast (rapid).  You are at higher risk of this condition if you smoke, are older, have diabetes, or are overweight.  Follow your doctor's instructions about medicines, diet, exercise, and follow-up visits.  Get help right away if you think that you have signs of a stroke. This information is not intended to replace advice given to you by your health care provider. Make sure you discuss any questions you have with your health care provider. Document Released: 08/29/2008 Document Revised: 01/11/2018 Document Reviewed: 01/11/2018 Elsevier Interactive Patient Education  2019 [place] on my medicine - ELIQUIS (apixaban)  Why was Eliquis prescribed for you? Eliquis was prescribed for you to reduce the risk of a blood clot forming that can cause a stroke if you have a medical condition called atrial fibrillation (a type of irregular heartbeat).  What do You need to know about Eliquis ? Take your Eliquis TWICE DAILY - one tablet in the morning and one tablet in the evening with or without food. If you have difficulty swallowing the tablet whole please discuss with your pharmacist how to take the medication safely.  Take Eliquis exactly as prescribed by your doctor and DO NOT stop taking Eliquis without talking to the doctor who prescribed the medication.  Stopping may increase your risk of developing a stroke.  Refill your prescription before you run out.  After discharge, you should have regular check-up appointments with your healthcare provider that is prescribing your Eliquis.  In the future your dose may need to be changed if your kidney function or weight changes by a significant amount or as you get older.  What do you do if you miss a dose? If you miss a dose, take it as soon as you remember on the same day and resume taking twice daily.  Do not take more than one dose of ELIQUIS at the same time to make up a missed dose.  Important Safety Information A  possible side effect of Eliquis is bleeding. You should call your healthcare provider right away if you experience any of the following: ? Bleeding from an injury or your nose that does not stop. ? Unusual colored urine (red or dark brown) or unusual colored stools (red or black). ? Unusual bruising for unknown reasons. ? A serious fall or if you hit your head (even if there is no bleeding).  Some medicines may interact with Eliquis and might increase your risk of bleeding or clotting while on Eliquis. To help avoid this, consult your healthcare provider or pharmacist prior to using any new prescription or non-prescription medications, including herbals, vitamins, non-steroidal anti-inflammatory drugs (NSAIDs) and supplements.  This website has more information on Eliquis (apixaban): http://www.eliquis.com/eliquis/home

## 2018-12-04 NOTE — Evaluation (Signed)
Physical Therapy Evaluation Patient Details Name: Tracy James MRN: [id number] DOB: Jun 06, 1951 Today's Date: 12/04/2018   History of Present Illness  Patient is a 68 y/o female who presents from PCP office with URI symtoms, found to have new onset A-fib with RVR. CXR- broncitis with LLL broncoPNA and bil pleural effusions. PMH includes HTN.  Clinical Impression  Patient presents with mild DOE and elevated HR worsened with mobility due to new diagnosis of A-fib with RVR. Tolerated gait training and stair training with Mod I. HR ranged from 100-150s bpm during mobility in A-fib. Pt with 1/4 DOE. Encouraged walking while in the hospital to tolerance and taking rest breaks as needed. Instructed pt on HR parameters and self monitoring symptoms. Pt independent PTA and lives alone. Does not require skilled therapy services as pt functioning close to baseline despite her HR. All education completed. Discharge from therapy.    Follow Up Recommendations No PT follow up;Supervision - Intermittent    Equipment Recommendations  None recommended by PT    Recommendations for Other Services       Precautions / Restrictions Precautions Precaution Comments: watch HR Restrictions Weight Bearing Restrictions: No      Mobility  Bed Mobility Overal bed mobility: Modified Independent                Transfers Overall transfer level: Modified independent               General transfer comment: Stood from EOB x1, no difficulty.  Ambulation/Gait Ambulation/Gait assistance: Modified independent (Device/Increase time) Gait Distance (Feet): 200 Feet Assistive device: None Gait Pattern/deviations: Step-through pattern;Decreased stride length   Gait velocity interpretation: >2.62 ft/sec, indicative of community ambulatory General Gait Details: Steady gait with 2 standing rest breaks due to HR. HR ranged from 100-150 bpm A-fib.   Stairs Stairs: Yes Stairs assistance: Modified independent  (Device/Increase time) Stair Management: Alternating pattern Number of Stairs: 4 General stair comments: Cues to use rail as needed for safety.   Wheelchair Mobility    Modified Rankin (Stroke Patients Only)       Balance Overall balance assessment: No apparent balance deficits (not formally assessed)                                           Pertinent Vitals/Pain Pain Assessment: No/denies pain    Home Living Family/patient expects to be discharged to:: Private residence Living Arrangements: Alone Available Help at Discharge: Family Type of Home: House Home Access: Stairs to enter Entrance Stairs-Rails: Right Entrance Stairs-Number of Steps: 3 or 4  Home Layout: One level Home Equipment: Shower seat      Prior Function Level of Independence: Independent         Comments: Retired working in school system; drives. Cooks, cleans.     Hand Dominance   Dominant Hand: Right    Extremity/Trunk Assessment   Upper Extremity Assessment Upper Extremity Assessment: Defer to OT evaluation    Lower Extremity Assessment Lower Extremity Assessment: Overall WFL for tasks assessed    Cervical / Trunk Assessment Cervical / Trunk Assessment: Normal  Communication   Communication: No difficulties  Cognition Arousal/Alertness: Awake/alert Behavior During Therapy: WFL for tasks assessed/performed Overall Cognitive Status: Within Functional Limits for tasks assessed  General Comments      Exercises     Assessment/Plan    PT Assessment Patent does not need any further PT services  PT Problem List         PT Treatment Interventions      PT Goals (Current goals can be found in the Care Plan section)  Acute Rehab PT Goals Patient Stated Goal: to go home today PT Goal Formulation: All assessment and education complete, DC therapy    Frequency     Barriers to discharge         Co-evaluation               AM-PAC PT "6 Clicks" Mobility  Outcome Measure Help needed turning from your back to your side while in a flat bed without using bedrails?: None Help needed moving from lying on your back to sitting on the side of a flat bed without using bedrails?: None Help needed moving to and from a bed to a chair (including a wheelchair)?: None Help needed standing up from a chair using your arms (e.g., wheelchair or bedside chair)?: None Help needed to walk in hospital room?: None Help needed climbing 3-5 steps with a railing? : A Little 6 Click Score: 23    End of Session Equipment Utilized During Treatment: Gait belt Activity Tolerance: Patient tolerated treatment well Patient left: in chair;with call bell/phone within reach Nurse Communication: Mobility status PT Visit Diagnosis: Difficulty in walking, not elsewhere classified (R26.2)    Time: 3159-4585 PT Time Calculation (min) (ACUTE ONLY): 20 min   Charges:   PT Evaluation $PT Eval Moderate Complexity: 1 Mod          Wray Kearns, PT, DPT Acute Rehabilitation Services Pager [phone] Office [phone]      [place] 12/04/2018, 9:38 AM

## 2018-12-04 NOTE — Progress Notes (Signed)
Progress Note  Patient Name: Tracy James Date of Encounter: 12/04/2018  Primary Cardiologist: Sherren Mocha, MD   Subjective   Patient feels very well today. Still some dyspnea. Feels phlegm is breaking up now.   Inpatient Medications    Scheduled Meds: . apixaban  5 mg Oral Q12H   Continuous Infusions: . diltiazem (CARDIZEM) infusion 10 mg/hr (12/04/18 0157)   PRN Meds: acetaminophen, ondansetron (ZOFRAN) IV   Vital Signs    Vitals:   12/04/18 0438 12/04/18 0439 12/04/18 0510 12/04/18 0746  BP:  118/88 (!) 126/95 125/87  Pulse: 76 (!) 46 (!) 110 (!) 131  Resp: 15 (!) 23 (!) 21 20  Temp:   97.7 F (36.5 C) 97.7 F (36.5 C)  TempSrc:   Oral Oral  SpO2: 99% 100% 99% 98%  Weight:      Height:        Intake/Output Summary (Last 24 hours) at 12/04/2018 0850 Last data filed at 12/04/2018 0700 Gross per 24 hour  Intake 1252.63 ml  Output -  Net 1252.63 ml   Filed Weights   12/03/18 1249  Weight: 82.1 kg    Telemetry    Afib. Rate 117. Range 90-150 - Personally Reviewed  ECG    None today. - Personally Reviewed  Physical Exam   GEN: No acute distress.   Neck: No JVD Cardiac: IRRR, no murmurs, rubs, or gallops.  Respiratory: Clear to auscultation bilaterally. GI: Soft, nontender, non-distended  MS: No edema; No deformity. Neuro:  Nonfocal  Psych: Normal affect   Labs    Chemistry Recent Labs  Lab 12/03/18 1307  NA 137  K 3.7  CL 104  CO2 21*  GLUCOSE 142*  BUN 28*  CREATININE 1.56*  CALCIUM 9.1  GFRNONAA 35*  GFRAA 40*  ANIONGAP 12     Hematology Recent Labs  Lab 12/03/18 1307  WBC 10.7*  RBC 4.38  HGB 14.0  HCT 43.4  MCV 99.1  MCH 32.0  MCHC 32.3  RDW 14.3  PLT 282    Cardiac Enzymes Recent Labs  Lab 12/03/18 1646 12/03/18 2306 12/04/18 0344  TROPONINI 0.03* 0.03* 0.03*    Recent Labs  Lab 12/03/18 1311  TROPIPOC 0.04     BNPNo results for input(s): BNP, PROBNP in the last 168 hours.   DDimer No results  for input(s): DDIMER in the last 168 hours.   Radiology    Dg Chest 2 View  Result Date: 12/03/2018 CLINICAL DATA:  Tracy James with history of cold symptoms for the past 2 weeks with shortness of breath for 1 week and cough for the past 3 days. EXAM: CHEST - 2 VIEW COMPARISON:  Chest x-ray 07/25/2017. FINDINGS: Diffuse peribronchial cuffing. Ill-defined opacity in the anterior aspect of the left lower lobe, concerning for bronchopneumonia. Trace bilateral pleural effusions. No evidence of pulmonary edema. Heart size is normal. Upper mediastinal contours are within normal limits. Aortic atherosclerosis. IMPRESSION: 1. Diffuse peribronchial cuffing concerning for bronchitis, with probable left lower lobe bronchopneumonia. 2. Trace bilateral pleural effusions. 3. Aortic atherosclerosis. Electronically Signed   By: [name] M.D.   On: 12/03/2018 13:45    Cardiac Studies   Echo is pending  Patient Profile     Tracy James with history of Aortic stenosis presents with acute bronchitis and new onset Afib with RVR.   Assessment & Plan    1.  New onset atrial fibrillation with RVR -Rate has improved on IV Cardizem drip now on 10  mg/hr -Atrial fibrillation likely triggered by respiratory infection -2D echo a year ago showed normal LV function with EF 60 to 65% with grade 2 diastolic dysfunction and moderate LVH -Repeat echo today.  Her left atrial size was mildly dilated a year ago -Start Eliquis 5 mg twice daily (age < 54, weight > 60kg and Creatinine < 1.5) forCADS2VASC score of 3 (age 72, James and HTN). -stop ASA since starting DOAC -Will plan to transition IV diltiazem to po today. Will also add metoprolol for better rate control. -check TSH -Follow Bmet and replete potassium to keep >4 and mag >2  2.  Exertional shortness of breath -Likely related to underlying URI with chest x-ray showing left lower lobe bronchopneumonia and diffuse peribronchial cuffing concerning  for bronchitis.  WBC elevated at 10.7 -Also has a history of moderate to severe aortic stenosis by echo a year ago.   -Shortness of breath also could be due to worsening aortic stenosis -Repeat 2D echo to assess aortic valve and LV function heart rate controlled -check BNP -chest x-ray did not show pulmonary edema -TRH has started on antibiotics  3.  Moderate to severe aortic stenosis -Her mean gradient was 35 mmHg, peak velocity 387 cm/s and VTI ratio of 0.23 which is more consistent with severe aortic stenosis.  -Repeat 2D echo to assess for progression of aortic stenosis -She has been evaluated in the past by Dr. Burt Knack and was supposed to follow-up with an echo earlier this year  4.  Hypertension -BP currently well controlled. ARB and HCTZ on hold due to AKI. Will manage with diltiazem and metoprolol.   5.  Chest pressure -Pain atypical and more pleuritic in nature. Improved today. Troponin 0.03 with flat trend. -Check Echo.  Will attempt to transition to po meds today and ambulate. If controlled may be able to DC home tomorrow with outpatient follow up.     For questions or updates, please contact [place] Please consult [email or web address] for contact info under        Signed, [name], MD  12/04/2018, 8:50 AM

## 2018-12-04 NOTE — Care Management (Addendum)
1204 12-04-18 CM did provide patient with 30 day free Eliquis Card. Unable to get cost today for medication. Pt will get cost from [facility] in [place] once filled. No further needs from CM at this time. Bethena Roys, RN,BSN Case Manager [phone]

## 2018-12-04 NOTE — Progress Notes (Addendum)
Family Medicine Teaching Service Daily Progress Note Intern Pager: [phone]  Patient name: Tracy James Medical record number: [id number] Date of birth: 04/28/1951 Age: 68 y.o. Gender: female  Primary Care Provider: Antony Blackbird, MD Consultants: Cardiology  Code Status: Full   Pt Overview and Major Events to Date:  Admitted to [facility] on 12/03/18  Assessment and Plan: Tracy James is a 68 y.o. female presenting with 1 week of shortness of breath and fatigue. PMH is significant for HTN, heart murmur, allergies.  New onset A-fibrillation  Unclear etiology at this time, likely 2/2 respiratory infection per cardiology. No prior history of A. fib.  HR improving but still tachycardic to 131.  Cardiology consulted, heparin drip started, dilt gtt started. CHADS-VASC score of 3 so high risk of thromboembolism. Wells score of of 1.5, low risk for PE. Troponin trended: stable 0.03>0.03>0.03 -Consult cardiology, appreciate recommendations: transition to PO diltiazem on 12/04/18 + metoprolol, check TSH -Continue heparin & Eliquis -Continue telemetry -echo planned for 12/04/18  AKI on CKD stage IIIa  BMP pending. Elevated to 1.56 on admission. Baseline creatinine appears to be 1.0-1.2.   -Hold HCTZ -Hold losartan -Continue to monitor  Lower respiratory tract infection Chest x-ray shows left lower lobe bronchopneumonia and with diffuse peribronchial cuffing.  RVP negative. Abx discontinued after 1 day. Strep pneumo negative.  -Legionella urinary antigen pending -s/p Ceftriaxone x1and Azithromycin 500 mg  Chronic hypertension Stable. Home medications include hydrochlorothiazide and losartan.  Currently normotensive. Holding home anti-hypertensive medication at this time due to mild AKI. -Hold HCTZ -Hold losartan  Moderate to severe aortic stenosis Has been evaluated by Dr. Burt Knack in the past with recommendation of f/u echo. Per cardiology repeat echo planned for 12/04/18.  -cardiology  consulted, appreciate recommendations -repeat echo  FEN/GI: General diet  Prophylaxis: Heparin  Disposition: continued inpatient stay, awaiting echo   Subjective:  Patient today states she feels "great" was able to work with PT and states she enjoyed walking. Denies CP or SOB.   Objective: Temp:  [97.7 F (36.5 C)-98 F (36.7 C)] 97.7 F (36.5 C) (01/01 0746) Pulse Rate:  [34-157] 131 (01/01 0746) Resp:  [15-26] 20 (01/01 0746) BP: (95-152)/(57-127) 125/87 (01/01 0746) SpO2:  [97 %-100 %] 98 % (01/01 0746) Weight:  [82.1 kg] 82.1 kg (12/31 1249) Physical Exam: General: awake and alert, sitting in chair, NAD  Cardiovascular: irregular rhythm  Respiratory: CTAB, no wheezes, rales, or rhonchi  Abdomen: soft, non tender, non distended, bowel sounds normal  Extremities: non tender, no edema   Laboratory: Recent Labs  Lab 12/03/18 1307 12/04/18 0859  WBC 10.7* 10.1  HGB 14.0 12.8  HCT 43.4 40.0  PLT 282 264   Recent Labs  Lab 12/03/18 1307  NA 137  K 3.7  CL 104  CO2 21*  BUN 28*  CREATININE 1.56*  CALCIUM 9.1  GLUCOSE 142*    Ref. Range 12/03/2018 16:46 12/03/2018 23:06 12/04/2018 03:44  Troponin I Latest Ref Range: <0.03 ng/mL 0.03 (HH) 0.03 (HH) 0.03 (HH)    Imaging/Diagnostic Tests: Dg Chest 2 View  Result Date: 12/03/2018 CLINICAL DATA:  68 year old female with history of cold symptoms for the past 2 weeks with shortness of breath for 1 week and cough for the past 3 days. EXAM: CHEST - 2 VIEW COMPARISON:  Chest x-ray 07/25/2017. FINDINGS: Diffuse peribronchial cuffing. Ill-defined opacity in the anterior aspect of the left lower lobe, concerning for bronchopneumonia. Trace bilateral pleural effusions. No evidence of pulmonary edema. Heart size is normal.  Upper mediastinal contours are within normal limits. Aortic atherosclerosis. IMPRESSION: 1. Diffuse peribronchial cuffing concerning for bronchitis, with probable left lower lobe bronchopneumonia. 2. Trace  bilateral pleural effusions. 3. Aortic atherosclerosis. Electronically Signed   By: [name] M.D.   On: 12/03/2018 13:45   Mm Diag Breast Tomo Bilateral  Result Date: 11/22/2018 CLINICAL DATA:  68 year old female with palpable abnormality in the right axilla. EXAM: DIGITAL DIAGNOSTIC BILATERAL MAMMOGRAM WITH CAD AND TOMO ULTRASOUND OF THE RIGHT AXILLA COMPARISON:  Previous exam(s). ACR Breast Density Category b: There are scattered areas of fibroglandular density. FINDINGS: No suspicious masses or calcifications are seen in either breast. Spot compression tangential tomograms were performed of the right axilla with no abnormalities seen. The lymph nodes in the right axilla appear mammographically stable dating back to at least 2007. Mammographic images were processed with CAD. Physical examination of the right axilla does not reveal any palpable masses. Targeted ultrasound of the right axilla was performed. No suspicious masses or abnormality seen, only normal-appearing axillary lymph nodes identified. IMPRESSION: 1.  No findings of malignancy in either breast. 2. No abnormalities identified at the site of palpable concern in the right axilla. RECOMMENDATION: 1. Clinical follow-up as needed for right axillary palpable abnormality. 2.  Screening mammogram in one year.(Code:SM-B-01Y) I have discussed the findings and recommendations with the patient. Results were also provided in writing at the conclusion of the visit. If applicable, a reminder letter will be sent to the patient regarding the next appointment. BI-RADS CATEGORY  1: Negative. Electronically Signed   By: Everlean Alstrom M.D.   On: 11/22/2018 10:48   [place] Axilla Right  Result Date: 11/22/2018 CLINICAL DATA:  68 year old female with palpable abnormality in the right axilla. EXAM: DIGITAL DIAGNOSTIC BILATERAL MAMMOGRAM WITH CAD AND TOMO ULTRASOUND OF THE RIGHT AXILLA COMPARISON:  Previous exam(s). ACR Breast Density Category b: There are  scattered areas of fibroglandular density. FINDINGS: No suspicious masses or calcifications are seen in either breast. Spot compression tangential tomograms were performed of the right axilla with no abnormalities seen. The lymph nodes in the right axilla appear mammographically stable dating back to at least 2007. Mammographic images were processed with CAD. Physical examination of the right axilla does not reveal any palpable masses. Targeted ultrasound of the right axilla was performed. No suspicious masses or abnormality seen, only normal-appearing axillary lymph nodes identified. IMPRESSION: 1.  No findings of malignancy in either breast. 2. No abnormalities identified at the site of palpable concern in the right axilla. RECOMMENDATION: 1. Clinical follow-up as needed for right axillary palpable abnormality. 2.  Screening mammogram in one year.(Code:SM-B-01Y) I have discussed the findings and recommendations with the patient. Results were also provided in writing at the conclusion of the visit. If applicable, a reminder letter will be sent to the patient regarding the next appointment. BI-RADS CATEGORY  1: Negative. Electronically Signed   By: Everlean Alstrom M.D.   On: 11/22/2018 10:48     Caroline More, DO 12/04/2018, 9:43 AM PGY-2, [place] Intern pager: [phone], text pages welcome

## 2018-12-04 NOTE — Progress Notes (Signed)
2D Echocardiogram has been performed.  Tracy James 12/04/2018, 11:21 AM

## 2018-12-05 DIAGNOSIS — I350 Nonrheumatic aortic (valve) stenosis: Secondary | ICD-10-CM

## 2018-12-05 LAB — CBC
HEMATOCRIT: 38.9 % (ref 36.0–46.0)
HEMOGLOBIN: 12.9 g/dL (ref 12.0–15.0)
MCH: 32.1 pg (ref 26.0–34.0)
MCHC: 33.2 g/dL (ref 30.0–36.0)
MCV: 96.8 fL (ref 80.0–100.0)
Platelets: 281 10*3/uL (ref 150–400)
RBC: 4.02 MIL/uL (ref 3.87–5.11)
RDW: 14.3 % (ref 11.5–15.5)
WBC: 10.9 10*3/uL — ABNORMAL HIGH (ref 4.0–10.5)
nRBC: 0 % (ref 0.0–0.2)

## 2018-12-05 LAB — BASIC METABOLIC PANEL
Anion gap: 12 (ref 5–15)
BUN: 37 mg/dL — ABNORMAL HIGH (ref 8–23)
CO2: 17 mmol/L — AB (ref 22–32)
Calcium: 8.9 mg/dL (ref 8.9–10.3)
Chloride: 108 mmol/L (ref 98–111)
Creatinine, Ser: 1.6 mg/dL — ABNORMAL HIGH (ref 0.44–1.00)
GFR calc non Af Amer: 33 mL/min — ABNORMAL LOW (ref >60)
GFR, EST AFRICAN AMERICAN: 39 mL/min — AB (ref >60)
Glucose, Bld: 113 mg/dL — ABNORMAL HIGH (ref 70–99)
Potassium: 4.4 mmol/L (ref 3.5–5.1)
Sodium: 137 mmol/L (ref 135–145)

## 2018-12-05 NOTE — Plan of Care (Signed)

## 2018-12-05 NOTE — Discharge Summary (Signed)
[facility] Discharge Summary  Patient name: Tracy James Medical record number: [id number] Date of birth: December 25, 1950 Age: 68 y.o. Gender: female Date of Admission: 12/03/2018  Date of Discharge: 12/07/2018 Admitting Physician: Dickie La, MD  Primary Care Provider: Antony Blackbird, MD Consultants: cardiology  Indication for Hospitalization: acute onset Afib with RVR  Discharge Diagnoses/Problem List:  Acute onset A fib with RVR AKI on CKD stage IIIa cHTN  HLD Aortic stenosis Overweight  Disposition: discharge home with cardiology f/u  Discharge Condition: symptoms resolved. stable  Discharge Exam:  (copied from progress note on day of discharge) General: awake and alert, sitting up in bed, NAD  Cardiovascular: irregular rhythm, no MRG  Respiratory: CTAB, no wheezes, rales or rhonchi  Abdomen: soft, non tender, non distended, bowel sounds normal  Extremities: no edema, no rashes Psych: pleasant, cooperative  Brief Hospital Course:  Tracy James is a 68 y.o. female presenting for 1 week SOB and fatigue. Patient was found to be in Afib with RVR in ED. New onset A fib thought to be 2/2 HTN, AS, and recent infection. Troponins trended with flat trend. Cardiology was consulted and performed TTE showing EF 40-45%, mild to moderate AS, moderate regurg. TEE was performed on 1/3 with plans to DCCV but was aborted when imaging showed large thrombus in LAA; mobile densities in RA likely thrombus and possible thrombus in pericardial space. Patient initially placed on heparin and dilt gtt but transitioned to Eliquis and PO dilt + metoprolol for rate control. Cardiology plan is to optimize medical management and anticoagulation for 1 month and reassess with repeat TEE OP. If patient still in Afib, will consider DCCV at that time. On day of discharge, patient had stable vital signs and wa asymptomatic able to tolerate ambulation without SOB.  AKI with Cr on  admission of 1.56 and 1.56 on day of discharge (baseline Cr from 10/04/2018 1.21). HCTZ and losartan were held for admission.   Issues for Follow Up:  1. HCTZ and losartan held, consider restarting with renal monitoring 2. F/u with cardiology for repeat TEE in ~1 month, Continue eliquis 5mg  BID in meantime  3. Continue metoprolol 50mg  BID and can consider increasing dose if not well rate controlled.   Significant Procedures: TTE, TEE  Significant Labs and Imaging:  Recent Labs  Lab 12/03/18 1307 12/04/18 0859 12/05/18 0323  WBC 10.7* 10.1 10.9*  HGB 14.0 12.8 12.9  HCT 43.4 40.0 38.9  PLT 282 264 281   Recent Labs  Lab 12/03/18 1307 12/04/18 0859 12/05/18 0323  NA 137 138 137  K 3.7 4.1 4.4  CL 104 107 108  CO2 21* 18* 17*  GLUCOSE 142* 137* 113*  BUN 28* 31* 37*  CREATININE 1.56* 1.35* 1.60*  CALCIUM 9.1 9.0 8.9  MG 1.8  --   --    troponins trended at .03 x3.   Results/Tests Pending at Time of Discharge: none- will need follow up TEE in ~1 month  Discharge Medications:  Allergies as of 12/07/2018   No Known Allergies     Medication List    STOP taking these medications   aspirin EC 81 MG tablet   CAYENNE PO   dextromethorphan-guaiFENesin 30-600 MG 12hr tablet Commonly known as:  MUCINEX DM   hydrochlorothiazide 25 MG tablet Commonly known as:  HYDRODIURIL   hydrocortisone cream 0.5 %   losartan 50 MG tablet Commonly known as:  COZAAR   silver sulfADIAZINE 1 % cream Commonly known  as:  SILVADENE   Turmeric 500 MG Tabs     TAKE these medications   apixaban 5 MG Tabs tablet Commonly known as:  ELIQUIS Take 1 tablet (5 mg total) by mouth every 12 (twelve) hours.   Calcium 600-200 MG-UNIT tablet Take 1 tablet by mouth daily.   diltiazem 300 MG 24 hr capsule Commonly known as:  CARDIZEM CD Take 1 capsule (300 mg total) by mouth daily. Start taking on:  December 08, 2018   loperamide 2 MG tablet Commonly known as:  LOPERAMIDE A-D Take 2 pills  then one pill after any additional loose stools What changed:    how much to take  how to take this  when to take this  reasons to take this   metoprolol tartrate 50 MG tablet Commonly known as:  LOPRESSOR Take 1 tablet (50 mg total) by mouth 2 (two) times daily.   nitroGLYCERIN 0.4 MG SL tablet Commonly known as:  NITROSTAT Place 1 tablet (0.4 mg total) under the tongue every 5 (five) minutes as needed for chest pain.   Omega-3 1000 MG Caps Take 1 capsule by mouth daily.       Discharge Instructions: Please refer to Patient Instructions section of EMR for full details.  Patient was counseled important signs and symptoms that should prompt return to medical care, changes in medications, dietary instructions, activity restrictions, and follow up appointments.   Follow-Up Appointments:   Richarda Osmond, DO 12/07/2018, 12:02 PM PGY-1, [place]

## 2018-12-05 NOTE — Progress Notes (Addendum)
Progress Note  Patient Name: Tracy James Date of Encounter: 12/05/2018  Primary Cardiologist: Sherren Mocha, MD   Subjective   Denies any chest pain or SOB  Inpatient Medications    Scheduled Meds: . apixaban  5 mg Oral Q12H  . diltiazem  300 mg Oral Daily  . metoprolol tartrate  25 mg Oral BID   Continuous Infusions:  PRN Meds: acetaminophen, ondansetron (ZOFRAN) IV   Vital Signs    Vitals:   12/04/18 1949 12/04/18 2319 12/05/18 0616 12/05/18 0740  BP: (!) 119/91 (!) 113/94 119/87 (!) 125/91  Pulse: 90 (!) 149 88 90  Resp:    20  Temp: (!) 97.5 F (36.4 C) 98.2 F (36.8 C) 98.6 F (37 C) 98.3 F (36.8 C)  TempSrc: Axillary Oral Oral Oral  SpO2: 100% 100% 99% 100%  Weight:   80.2 kg   Height:        Intake/Output Summary (Last 24 hours) at 12/05/2018 1610 Last data filed at 12/05/2018 0600 Gross per 24 hour  Intake 1262.33 ml  Output 0 ml  Net 1262.33 ml   Filed Weights   12/03/18 1249 12/05/18 0616  Weight: 82.1 kg 80.2 kg    Telemetry     Atrial fibrillation with RVR- Personally Reviewed  ECG    No new EKG to review - Personally Reviewed  Physical Exam   GEN: Well nourished, well developed in no acute distress HEENT: Normal NECK: No JVD; No carotid bruits LYMPHATICS: No lymphadenopathy CARDIAC:irregularly irregular and tachycardia, no murmurs, rubs, gallops RESPIRATORY:  Clear to auscultation without rales, wheezing or rhonchi  ABDOMEN: Soft, non-tender, non-distended MUSCULOSKELETAL:  No edema; No deformity  SKIN: Warm and dry NEUROLOGIC:  Alert and oriented x 3 PSYCHIATRIC:  Normal affect    Labs    Chemistry Recent Labs  Lab 12/03/18 1307 12/04/18 0859 12/05/18 0323  NA 137 138 137  K 3.7 4.1 4.4  CL 104 107 108  CO2 21* 18* 17*  GLUCOSE 142* 137* 113*  BUN 28* 31* 37*  CREATININE 1.56* 1.35* 1.60*  CALCIUM 9.1 9.0 8.9  GFRNONAA 35* 41* 33*  GFRAA 40* 48* 39*  ANIONGAP 12 13 12      Hematology Recent Labs  Lab  12/03/18 1307 12/04/18 0859 12/05/18 0323  WBC 10.7* 10.1 10.9*  RBC 4.38 4.18 4.02  HGB 14.0 12.8 12.9  HCT 43.4 40.0 38.9  MCV 99.1 95.7 96.8  MCH 32.0 30.6 32.1  MCHC 32.3 32.0 33.2  RDW 14.3 14.4 14.3  PLT 282 264 281    Cardiac Enzymes Recent Labs  Lab 12/03/18 1646 12/03/18 2306 12/04/18 0344  TROPONINI 0.03* 0.03* 0.03*    Recent Labs  Lab 12/03/18 1311  TROPIPOC 0.04     BNPNo results for input(s): BNP, PROBNP in the last 168 hours.   DDimer No results for input(s): DDIMER in the last 168 hours.   Radiology    Dg Chest 2 View  Result Date: 12/03/2018 CLINICAL DATA:  68 year old female with history of cold symptoms for the past 2 weeks with shortness of breath for 1 week and cough for the past 3 days. EXAM: CHEST - 2 VIEW COMPARISON:  Chest x-ray 07/25/2017. FINDINGS: Diffuse peribronchial cuffing. Ill-defined opacity in the anterior aspect of the left lower lobe, concerning for bronchopneumonia. Trace bilateral pleural effusions. No evidence of pulmonary edema. Heart size is normal. Upper mediastinal contours are within normal limits. Aortic atherosclerosis. IMPRESSION: 1. Diffuse peribronchial cuffing concerning for bronchitis, with probable  left lower lobe bronchopneumonia. 2. Trace bilateral pleural effusions. 3. Aortic atherosclerosis. Electronically Signed   By: [name] M.D.   On: 12/03/2018 13:45    Cardiac Studies   2D echo 12/04/2018 Study Conclusions  - Left ventricle: The cavity size was normal. Wall thickness was   increased in a pattern of mild LVH. Systolic function was mildly   to moderately reduced. The estimated ejection fraction was in the   range of 40% to 45%. Diffuse hypokinesis. The study was not   technically sufficient to allow evaluation of LV diastolic   dysfunction due to atrial fibrillation. Doppler parameters are   consistent with high ventricular filling pressure. - Aortic valve: Trileaflet; mildly thickened, mildly  calcified   leaflets. There was mild to moderate stenosis. There was trivial   regurgitation. Valve area (VTI): 0.56 cm^2. Valve area (Vmax):   0.64 cm^2. Valve area (Vmean): 0.56 cm^2. - Mitral valve: Calcified annulus. There was moderate   regurgitation. Two distinct jets noted. - Left atrium: The atrium was mildly dilated. - Right ventricle: Systolic function was moderately reduced. - Atrial septum: No defect or patent foramen ovale was identified. - Pulmonary arteries: PA peak pressure: 28 mm Hg (S). - Inferior vena cava: The vessel was dilated. The respirophasic   diameter changes were blunted (< 50%), consistent with elevated   central venous pressure.   Patient Profile     68 y.o. female with history of Aortic stenosis presents with acute bronchitis and new onset Afib with RVR.   Assessment & Plan    1.  New onset atrial fibrillation with RVR -HR remains elevated despite very high dose of Cardizem CD and lopressor -Atrial fibrillation likely triggered by respiratory infection -2D echo a year ago showed normal LV function with EF 60 to 65% but now down to 40-45%.   -Started on Eliquis 5 mg twice daily (age < 93, weight > 60kg and Creatinine < 1.5) forCADS2VASC score of 3 (age 89, female and HTN). -stop ASA since starting DOAC -TSH 0.991 -Cardizem CD 300mg  daily -increase lopressor to 50mg  BID for better rate control -given difficulty of rate control, recommend TEE/DCCV tomorrow.   -After careful review of history and examination, the risks and benefits of transesophageal echocardiogram have been explained including risks of esophageal damage, perforation (1:10,000 risk), bleeding, pharyngeal hematoma as well as other potential complications associated with conscious sedation including aspiration, arrhythmia, respiratory failure and death. Alternatives to treatment were discussed, questions were answered. Patient is willing to proceed.   2.  Exertional shortness of  breath -Likely related to underlying URI with chest x-ray showing left lower lobe bronchopneumonia and diffuse peribronchial cuffing concerning for bronchitis as well as afib with RVR.  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  Moderate to severe aortic stenosis -Repeat 2D echo showed peak AV velocity 274cm/s with mean AV gradient 9mmHg and AVA 0.64cm2.  Dimensionless index c/w severe AS at 0.18 -She has been evaluated in the past by Dr. Burt Knack and was supposed to follow-up with an echo earlier this year -TEE will help with further assessment -will get back in NSR and then further workup with Structural heart team  4.  Hypertension -BP stable at 125/78mmHg -ARB and HCTZ on hold due to AKI.  -  Will manage with diltiazem and metoprolol.   5.  Chest pressure -Pain atypical and more pleuritic in nature -resolved  -Troponin 0.03 with flat trend. 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  > 50% of time was spent in direct patient care.     For questions or updates, please contact [place] Please consult [email or web address] for contact info under        Signed, Fransico Him, MD  12/05/2018, 8:19 AM

## 2018-12-05 NOTE — Progress Notes (Signed)
Family Medicine Teaching Service Daily Progress Note Intern Pager: [phone]  Patient name: Tracy James Medical record number: [id number] Date of birth: 12-11-1950 Age: 68 y.o. Gender: female  Primary Care Provider: Antony Blackbird, MD Consultants: Cardiology  Code Status: Full   Pt Overview and Major Events to Date:  Admitted to [facility] on 12/03/18  Assessment and Plan: MYKELA MEWBORN a 68 y.o.femalepresenting with 1 week of shortness of breath and fatigue. PMH is significant forHTN, heart murmur, allergies.  New onset A-fibrillation New diagnosis in setting of HTN, AS, and recent infection.HR improving, current HR 90.CHADS-VASC score of 3 (age, female, HTN) so high risk of thromboembolism. Wells score of of 1.5, low risk for PE. Troponin trended: stable 0.03>0.03>0.03. Echo from 1/1 showing EF 40-45%, mild to moderate AS, moderate regurg -Consult cardiology, appreciate recommendations: TEE/DCCV on 1/3 -Continue Eliquis -Continue telemetry -Continue diltiaze m300mg  daily -Continue metoprolol 25mg  bid   AKI on CKD stage IIIa  Trended at 1.56>1.35>1.60. Baseline creatinine appears to be 1.0-1.2.  -Hold HCTZ -Hold losartan -Continue to monitor -can consider IV fluid hydration given spike in Cr on 1/2  Lower respiratory tract infection Chest x-ray shows left lower lobe bronchopneumonia and with diffuse peribronchial cuffing. RVP negative. Abx discontinued after 1 day. Strep pneumo negative. Legionella urinary antigen negative -s/p Ceftriaxone x1 and Azithromycin500 mg -monitor for signs of clinical worsening  Chronic hypertension Stable. Home medications include hydrochlorothiazide and losartan. Currently normotensive. Holding home anti-hypertensive medication at this time due to mild AKI. -Hold HCTZ -Hold losartan  Moderate to severe aortic stenosis Has been evaluated by Dr. Burt Knack in the past with recommendation of f/u echo. Per cardiology repeat echo planned  for 12/04/18. Echo as above -cardiology consulted, appreciate recommendations  FEN/GI:General diet  Prophylaxis: Apixaban  Disposition: awaiting TEE on 12/06/18  Subjective:  Patient today states  Objective: Temp:  [97.4 F (36.3 C)-98.6 F (37 C)] 97.8 F (36.6 C) (01/02 1158) Pulse Rate:  [72-149] 72 (01/02 1158) Resp:  [20] 20 (01/02 1158) BP: (108-125)/(78-94) 108/78 (01/02 1158) SpO2:  [99 %-100 %] 100 % (01/02 1158) Weight:  [80.2 kg] 80.2 kg (01/02 0616) Physical Exam: General: awake and alert, sitting up in chair, NAD  Cardiovascular: irregular rhythm, no MRG  Respiratory: CTAB, no wheezes, rales or rhonchi  Abdomen: soft, non tender, non distended, bowel sounds normal  Extremities: no edema, non tender, no calf tenderness, neg Homans   Laboratory: Recent Labs  Lab 12/03/18 1307 12/04/18 0859 12/05/18 0323  WBC 10.7* 10.1 10.9*  HGB 14.0 12.8 12.9  HCT 43.4 40.0 38.9  PLT 282 264 281   Recent Labs  Lab 12/03/18 1307 12/04/18 0859 12/05/18 0323  NA 137 138 137  K 3.7 4.1 4.4  CL 104 107 108  CO2 21* 18* 17*  BUN 28* 31* 37*  CREATININE 1.56* 1.35* 1.60*  CALCIUM 9.1 9.0 8.9  GLUCOSE 142* 137* 113*     Imaging/Diagnostic Tests: Dg Chest 2 View  Result Date: 12/03/2018 CLINICAL DATA:  68 year old female with history of cold symptoms for the past 2 weeks with shortness of breath for 1 week and cough for the past 3 days. EXAM: CHEST - 2 VIEW COMPARISON:  Chest x-ray 07/25/2017. FINDINGS: Diffuse peribronchial cuffing. Ill-defined opacity in the anterior aspect of the left lower lobe, concerning for bronchopneumonia. Trace bilateral pleural effusions. No evidence of pulmonary edema. Heart size is normal. Upper mediastinal contours are within normal limits. Aortic atherosclerosis. IMPRESSION: 1. Diffuse peribronchial cuffing concerning for bronchitis, with  probable left lower lobe bronchopneumonia. 2. Trace bilateral pleural effusions. 3. Aortic  atherosclerosis. Electronically Signed   By: [name] M.D.   On: 12/03/2018 13:45   Mm Diag Breast Tomo Bilateral  Result Date: 11/22/2018 CLINICAL DATA:  68 year old female with palpable abnormality in the right axilla. EXAM: DIGITAL DIAGNOSTIC BILATERAL MAMMOGRAM WITH CAD AND TOMO ULTRASOUND OF THE RIGHT AXILLA COMPARISON:  Previous exam(s). ACR Breast Density Category b: There are scattered areas of fibroglandular density. FINDINGS: No suspicious masses or calcifications are seen in either breast. Spot compression tangential tomograms were performed of the right axilla with no abnormalities seen. The lymph nodes in the right axilla appear mammographically stable dating back to at least 2007. Mammographic images were processed with CAD. Physical examination of the right axilla does not reveal any palpable masses. Targeted ultrasound of the right axilla was performed. No suspicious masses or abnormality seen, only normal-appearing axillary lymph nodes identified. IMPRESSION: 1.  No findings of malignancy in either breast. 2. No abnormalities identified at the site of palpable concern in the right axilla. RECOMMENDATION: 1. Clinical follow-up as needed for right axillary palpable abnormality. 2.  Screening mammogram in one year.(Code:SM-B-01Y) I have discussed the findings and recommendations with the patient. Results were also provided in writing at the conclusion of the visit. If applicable, a reminder letter will be sent to the patient regarding the next appointment. BI-RADS CATEGORY  1: Negative. Electronically Signed   By: Everlean Alstrom M.D.   On: 11/22/2018 10:48   [place] Axilla Right  Result Date: 11/22/2018 CLINICAL DATA:  68 year old female with palpable abnormality in the right axilla. EXAM: DIGITAL DIAGNOSTIC BILATERAL MAMMOGRAM WITH CAD AND TOMO ULTRASOUND OF THE RIGHT AXILLA COMPARISON:  Previous exam(s). ACR Breast Density Category b: There are scattered areas of fibroglandular  density. FINDINGS: No suspicious masses or calcifications are seen in either breast. Spot compression tangential tomograms were performed of the right axilla with no abnormalities seen. The lymph nodes in the right axilla appear mammographically stable dating back to at least 2007. Mammographic images were processed with CAD. Physical examination of the right axilla does not reveal any palpable masses. Targeted ultrasound of the right axilla was performed. No suspicious masses or abnormality seen, only normal-appearing axillary lymph nodes identified. IMPRESSION: 1.  No findings of malignancy in either breast. 2. No abnormalities identified at the site of palpable concern in the right axilla. RECOMMENDATION: 1. Clinical follow-up as needed for right axillary palpable abnormality. 2.  Screening mammogram in one year.(Code:SM-B-01Y) I have discussed the findings and recommendations with the patient. Results were also provided in writing at the conclusion of the visit. If applicable, a reminder letter will be sent to the patient regarding the next appointment. BI-RADS CATEGORY  1: Negative. Electronically Signed   By: Everlean Alstrom M.D.   On: 11/22/2018 10:48     Caroline More, DO 12/05/2018, 12:13 PM PGY-2, [place] Intern pager: [phone], text pages welcome

## 2018-12-05 NOTE — Progress Notes (Signed)
OT Cancellation Note  Patient Details Name: Tracy James MRN: [id number] DOB: 07/23/1951   Cancelled Treatment:    Reason Eval/Treat Not Completed: OT screened, no needs identified, will sign off Met with pt sitting up in recliner, very lively and motivated. Pt is completing BADL at baseline, ind with all functional mobility and transfers. No further OT needs identified at this time. OT will sign off.   Zenovia Jarred, MSOT, OTR/L [place] OT/ Acute Relief OT [facility] Office: [place] 12/05/2018, 3:00 PM

## 2018-12-05 NOTE — Care Management (Signed)
#    1.   S/W   KATINA   @  [facility] # [id number]  APIXABAN : NONE FORMULARY  ELIQUIS    5 MG  EVERY 12 HRS COVER  YES CO-PAY- $ 40.00 PRIOR APPROVAL-NO  PREFERRED PHARMACY : YES [facility] AND [facility]

## 2018-12-06 ENCOUNTER — Inpatient Hospital Stay (HOSPITAL_COMMUNITY): Payer: Medicare Other | Admitting: Registered Nurse

## 2018-12-06 ENCOUNTER — Encounter (HOSPITAL_COMMUNITY): Payer: Self-pay

## 2018-12-06 ENCOUNTER — Encounter (HOSPITAL_COMMUNITY)
Admission: EM | Disposition: A | Discharge status: Home / Self Care | Payer: Self-pay | Source: Home / Self Care | Attending: Family Medicine

## 2018-12-06 ENCOUNTER — Inpatient Hospital Stay (HOSPITAL_COMMUNITY): Payer: Medicare Other

## 2018-12-06 DIAGNOSIS — I340 Nonrheumatic mitral (valve) insufficiency: Secondary | ICD-10-CM

## 2018-12-06 DIAGNOSIS — I351 Nonrheumatic aortic (valve) insufficiency: Secondary | ICD-10-CM

## 2018-12-06 HISTORY — PX: TEE WITHOUT CARDIOVERSION: SHX5443

## 2018-12-06 LAB — BASIC METABOLIC PANEL
Anion gap: 10 (ref 5–15)
BUN: 36 mg/dL — AB (ref 8–23)
CO2: 19 mmol/L — ABNORMAL LOW (ref 22–32)
Calcium: 9.3 mg/dL (ref 8.9–10.3)
Chloride: 108 mmol/L (ref 98–111)
Creatinine, Ser: 1.63 mg/dL — ABNORMAL HIGH (ref 0.44–1.00)
GFR calc Af Amer: 38 mL/min — ABNORMAL LOW (ref >60)
GFR calc non Af Amer: 33 mL/min — ABNORMAL LOW (ref >60)
Glucose, Bld: 126 mg/dL — ABNORMAL HIGH (ref 70–99)
Potassium: 4.3 mmol/L (ref 3.5–5.1)
Sodium: 137 mmol/L (ref 135–145)

## 2018-12-06 LAB — CBC
HCT: 41.1 % (ref 36.0–46.0)
Hemoglobin: 13.2 g/dL (ref 12.0–15.0)
MCH: 30.6 pg (ref 26.0–34.0)
MCHC: 32.1 g/dL (ref 30.0–36.0)
MCV: 95.4 fL (ref 80.0–100.0)
Platelets: 299 10*3/uL (ref 150–400)
RBC: 4.31 MIL/uL (ref 3.87–5.11)
RDW: 14 % (ref 11.5–15.5)
WBC: 11.3 10*3/uL — ABNORMAL HIGH (ref 4.0–10.5)
nRBC: 0 % (ref 0.0–0.2)

## 2018-12-06 SURGERY — ECHOCARDIOGRAM, TRANSESOPHAGEAL
Anesthesia: Monitor Anesthesia Care

## 2018-12-06 MED ORDER — PROPOFOL 10 MG/ML IV BOLUS
INTRAVENOUS | Status: DC | PRN
  Administered 2018-12-06: 50 mg via INTRAVENOUS

## 2018-12-06 MED ORDER — METOPROLOL TARTRATE 25 MG PO TABS
25.0000 mg | ORAL_TABLET | Freq: Once | ORAL | Status: AC
  Administered 2018-12-06: 25 mg via ORAL
  Filled 2018-12-06: qty 1

## 2018-12-06 MED ORDER — PROPOFOL 500 MG/50ML IV EMUL
INTRAVENOUS | Status: DC | PRN
  Administered 2018-12-06: 50 ug/kg/min via INTRAVENOUS

## 2018-12-06 MED ORDER — SODIUM CHLORIDE 0.9 % IV SOLN
INTRAVENOUS | Status: DC | PRN
  Administered 2018-12-06: 12:00:00 via INTRAVENOUS

## 2018-12-06 MED ORDER — METOPROLOL TARTRATE 50 MG PO TABS
50.0000 mg | ORAL_TABLET | Freq: Two times a day (BID) | ORAL | Status: DC
  Administered 2018-12-06 – 2018-12-07 (×2): 50 mg via ORAL
  Filled 2018-12-06 (×2): qty 1

## 2018-12-06 MED ORDER — GLYCOPYRROLATE PF 0.2 MG/ML IJ SOSY
PREFILLED_SYRINGE | INTRAMUSCULAR | Status: DC | PRN
  Administered 2018-12-06: .2 mg via INTRAVENOUS

## 2018-12-06 MED ORDER — LIDOCAINE 2% (20 MG/ML) 5 ML SYRINGE
INTRAMUSCULAR | Status: DC | PRN
  Administered 2018-12-06: 60 mg via INTRAVENOUS

## 2018-12-06 NOTE — Anesthesia Procedure Notes (Signed)
Procedure Name: MAC Date/Time: 12/06/2018 12:08 PM Performed by: Trinna Post., CRNA Pre-anesthesia Checklist: Patient identified, Emergency Drugs available, Suction available, Patient being monitored and Timeout performed Patient Re-evaluated:Patient Re-evaluated prior to induction Oxygen Delivery Method: Nasal cannula Induction Type: IV induction Placement Confirmation: positive ETCO2 Dental Injury: Teeth and Oropharynx as per pre-operative assessment

## 2018-12-06 NOTE — Anesthesia Postprocedure Evaluation (Signed)
Anesthesia Post Note  Patient: Tracy James  Procedure(s) Performed: TRANSESOPHAGEAL ECHOCARDIOGRAM (TEE) (N/A )     Patient location during evaluation: Endoscopy Anesthesia Type: MAC Level of consciousness: awake Pain management: pain level controlled Vital Signs Assessment: post-procedure vital signs reviewed and stable Respiratory status: spontaneous breathing Cardiovascular status: stable Postop Assessment: no apparent nausea or vomiting Anesthetic complications: no    Last Vitals:  Vitals:   12/06/18 1232 12/06/18 1243  BP: (!) 115/93   Pulse: (!) 102 (!) 35  Resp: 12 17  Temp: 36.5 C   SpO2: 98% 100%    Last Pain:  Vitals:   12/06/18 1232  TempSrc: Oral  PainSc:    Pain Goal:                 [name]

## 2018-12-06 NOTE — Progress Notes (Signed)
Family Medicine Teaching Service Daily Progress Note Intern Pager: [phone]  Patient name: Tracy James Medical record number: [id number] Date of birth: 16-Sep-1951 Age: 68 y.o. Gender: female  Primary Care Provider: Antony Blackbird, MD Consultants: Cardiology  Code Status: Full   Pt Overview and Major Events to Date:  Admitted to [facility] on 12/03/18  Assessment and Plan: Tracy James a 68 y.o.femalepresenting with 1 week of shortness of breath and fatigue. PMH is significant forHTN, heart murmur, allergies.  New onset A-fibrillation New diagnosis. Echo from 1/1 showing EF 40-45%, mild to moderate AS, moderate regurg. 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 LV dysfunction worse than 40%. Patient remains in A fib with RVR. Cardiology keeping patient on cardizem and increase lopressor to 50mg  BID for better rate control as well as continue eliquis. Creatinine precludes cardiac MRI with contrast at this time inhibiting finding the etiology of the pericardial space mass. Patient HR is controlled intermittently with tachycardia periodically.  -Continue Eliquis -Continue telemetry -Continue Cardizem 300mg  daily -Continue metoprolol 50mg  bid  - f/u cards recs  AKI on CKD stage IIIa  Cr 1.63 today. If improves, consider cardiac MRI -Hold HCTZ -Hold losartan -Continue to monitor -avoid nephrotoxic agents  Lower respiratory tract infection Chest x-ray shows left lower lobe bronchopneumonia and with diffuse peribronchial cuffing. RVP negative. Abx discontinued after 1 day. Strep pneumo negative. Legionella urinary antigen negative -s/p Ceftriaxone x1 and Azithromycin500 mg -monitor for signs of clinical worsening  Chronic hypertension- has been normotensive to increase of 135/96 Stable. Home medications include hydrochlorothiazide and losartan. Holding home anti-hypertensive medication at this time due to mild  AKI. -Hold HCTZ - consider restarting losartan   FEN/GI:General diet  Prophylaxis: Apixaban  Disposition: pending cardiology clearance  Subjective:  Patient states that she feels great today. She is ready to get out of the hospital. She's anxious to get her TEE completed. She states that she is able to ambulate without shortness of breath.  Objective: Temp:  [97.5 F (36.4 C)-98 F (36.7 C)] 97.7 F (36.5 C) (01/03 1232) Pulse Rate:  [35-107] 107 (01/03 1315) Resp:  [12-18] 13 (01/03 1315) BP: (106-139)/(78-96) 131/91 (01/03 1315) SpO2:  [96 %-100 %] 100 % (01/03 1315) Weight:  [79.1 kg] 79.1 kg (01/03 1143) Physical Exam: General: awake and alert, sitting up in bed, NAD  Cardiovascular: irregular rhythm, no MRG  Respiratory: CTAB, no wheezes, rales or rhonchi  Abdomen: soft, non tender, non distended, bowel sounds normal  Extremities: no edema, no rashes  Laboratory: Recent Labs  Lab 12/04/18 0859 12/05/18 0323 12/06/18 0452  WBC 10.1 10.9* 11.3*  HGB 12.8 12.9 13.2  HCT 40.0 38.9 41.1  PLT 264 281 299   Recent Labs  Lab 12/04/18 0859 12/05/18 0323 12/06/18 0452  NA 138 137 137  K 4.1 4.4 4.3  CL 107 108 108  CO2 18* 17* 19*  BUN 31* 37* 36*  CREATININE 1.35* 1.60* 1.63*  CALCIUM 9.0 8.9 9.3  GLUCOSE 137* 113* 126*     Imaging/Diagnostic Tests: Dg Chest 2 View  Result Date: 12/03/2018 CLINICAL DATA:  68 year old female with history of cold symptoms for the past 2 weeks with shortness of breath for 1 week and cough for the past 3 days. EXAM: CHEST - 2 VIEW COMPARISON:  Chest x-ray 07/25/2017. FINDINGS: Diffuse peribronchial cuffing. Ill-defined opacity in the anterior aspect of the left lower lobe, concerning for bronchopneumonia.  Trace bilateral pleural effusions. No evidence of pulmonary edema. Heart size is normal. Upper mediastinal contours are within normal limits. Aortic atherosclerosis. IMPRESSION: 1. Diffuse peribronchial cuffing concerning for  bronchitis, with probable left lower lobe bronchopneumonia. 2. Trace bilateral pleural effusions. 3. Aortic atherosclerosis. Electronically Signed   By: [name] M.D.   On: 12/03/2018 13:45   Mm Diag Breast Tomo Bilateral  Result Date: 11/22/2018 CLINICAL DATA:  68 year old female with palpable abnormality in the right axilla. EXAM: DIGITAL DIAGNOSTIC BILATERAL MAMMOGRAM WITH CAD AND TOMO ULTRASOUND OF THE RIGHT AXILLA COMPARISON:  Previous exam(s). ACR Breast Density Category b: There are scattered areas of fibroglandular density. FINDINGS: No suspicious masses or calcifications are seen in either breast. Spot compression tangential tomograms were performed of the right axilla with no abnormalities seen. The lymph nodes in the right axilla appear mammographically stable dating back to at least 2007. Mammographic images were processed with CAD. Physical examination of the right axilla does not reveal any palpable masses. Targeted ultrasound of the right axilla was performed. No suspicious masses or abnormality seen, only normal-appearing axillary lymph nodes identified. IMPRESSION: 1.  No findings of malignancy in either breast. 2. No abnormalities identified at the site of palpable concern in the right axilla. RECOMMENDATION: 1. Clinical follow-up as needed for right axillary palpable abnormality. 2.  Screening mammogram in one year.(Code:SM-B-01Y) I have discussed the findings and recommendations with the patient. Results were also provided in writing at the conclusion of the visit. If applicable, a reminder letter will be sent to the patient regarding the next appointment. BI-RADS CATEGORY  1: Negative. Electronically Signed   By: Everlean Alstrom M.D.   On: 11/22/2018 10:48   [place] Axilla Right  Result Date: 11/22/2018 CLINICAL DATA:  68 year old female with palpable abnormality in the right axilla. EXAM: DIGITAL DIAGNOSTIC BILATERAL MAMMOGRAM WITH CAD AND TOMO ULTRASOUND OF THE RIGHT AXILLA  COMPARISON:  Previous exam(s). ACR Breast Density Category b: There are scattered areas of fibroglandular density. FINDINGS: No suspicious masses or calcifications are seen in either breast. Spot compression tangential tomograms were performed of the right axilla with no abnormalities seen. The lymph nodes in the right axilla appear mammographically stable dating back to at least 2007. Mammographic images were processed with CAD. Physical examination of the right axilla does not reveal any palpable masses. Targeted ultrasound of the right axilla was performed. No suspicious masses or abnormality seen, only normal-appearing axillary lymph nodes identified. IMPRESSION: 1.  No findings of malignancy in either breast. 2. No abnormalities identified at the site of palpable concern in the right axilla. RECOMMENDATION: 1. Clinical follow-up as needed for right axillary palpable abnormality. 2.  Screening mammogram in one year.(Code:SM-B-01Y) I have discussed the findings and recommendations with the patient. Results were also provided in writing at the conclusion of the visit. If applicable, a reminder letter will be sent to the patient regarding the next appointment. BI-RADS CATEGORY  1: Negative. Electronically Signed   By: Everlean Alstrom M.D.   On: 11/22/2018 10:48     Richarda Osmond, DO 12/06/2018, 4:14 PM PGY-1, [place] Intern pager: [phone], text pages welcome

## 2018-12-06 NOTE — Progress Notes (Addendum)
Progress Note  Patient Name: Tracy James Date of Encounter: 12/06/2018  Primary Cardiologist: Sherren Mocha, MD   Subjective   Denies any chest pain or SOB.  Having episodes of rapid A. fib up in the 130s to 140s  Inpatient Medications    Scheduled Meds: . apixaban  5 mg Oral Q12H  . diltiazem  300 mg Oral Daily  . metoprolol tartrate  25 mg Oral BID   Continuous Infusions:  PRN Meds: acetaminophen, ondansetron (ZOFRAN) IV   Vital Signs    Vitals:   12/05/18 1922 12/05/18 2325 12/06/18 0448 12/06/18 0755  BP: 107/78 119/87 129/82 133/90  Pulse: 72 (!) 56 (!) 107 80  Resp:    18  Temp: (!) 97.5 F (36.4 C) 98 F (36.7 C) 97.8 F (36.6 C) 97.9 F (36.6 C)  TempSrc: Oral Oral Oral Oral  SpO2: 100% 100% 96% 100%  Weight:   79.1 kg   Height:        Intake/Output Summary (Last 24 hours) at 12/06/2018 7001 Last data filed at 12/05/2018 1700 Gross per 24 hour  Intake 780 ml  Output -  Net 780 ml   Filed Weights   12/03/18 1249 12/05/18 0616 12/06/18 0448  Weight: 82.1 kg 80.2 kg 79.1 kg    Telemetry    Atrial fibrillation with RVR up to the 130s- Personally Reviewed  ECG    New EKG to review- Personally Reviewed  Physical Exam   GEN: Well nourished, well developed in no acute distress HEENT: Normal NECK: No JVD; No carotid bruits LYMPHATICS: No lymphadenopathy CARDIAC: Regularly irregular and tachycardic, no murmurs, rubs, gallops RESPIRATORY: Decreased breath sounds at right base otherwise clear ABDOMEN: Soft, non-tender, non-distended MUSCULOSKELETAL:  No edema; No deformity  SKIN: Warm and dry NEUROLOGIC:  Alert and oriented x 3 PSYCHIATRIC:  Normal affect    Labs    Chemistry Recent Labs  Lab 12/04/18 0859 12/05/18 0323 12/06/18 0452  NA 138 137 137  K 4.1 4.4 4.3  CL 107 108 108  CO2 18* 17* 19*  GLUCOSE 137* 113* 126*  BUN 31* 37* 36*  CREATININE 1.35* 1.60* 1.63*  CALCIUM 9.0 8.9 9.3  GFRNONAA 41* 33* 33*  GFRAA 48* 39*  38*  ANIONGAP 13 12 10      Hematology Recent Labs  Lab 12/04/18 0859 12/05/18 0323 12/06/18 0452  WBC 10.1 10.9* 11.3*  RBC 4.18 4.02 4.31  HGB 12.8 12.9 13.2  HCT 40.0 38.9 41.1  MCV 95.7 96.8 95.4  MCH 30.6 32.1 30.6  MCHC 32.0 33.2 32.1  RDW 14.4 14.3 14.0  PLT 264 281 299    Cardiac Enzymes Recent Labs  Lab 12/03/18 1646 12/03/18 2306 12/04/18 0344  TROPONINI 0.03* 0.03* 0.03*    Recent Labs  Lab 12/03/18 1311  TROPIPOC 0.04     BNPNo results for input(s): BNP, PROBNP in the last 168 hours.   DDimer No results for input(s): DDIMER in the last 168 hours.   Radiology    No results found.  Cardiac Studies   2D echo 12/04/2018 Study Conclusions  - Left ventricle: The cavity size was normal. Wall thickness was   increased in a pattern of mild LVH. Systolic function was mildly   to moderately reduced. The estimated ejection fraction was in the   range of 40% to 45%. Diffuse hypokinesis. The study was not   technically sufficient to allow evaluation of LV diastolic   dysfunction due to atrial fibrillation. Doppler parameters are  consistent with high ventricular filling pressure. - Aortic valve: Trileaflet; mildly thickened, mildly calcified   leaflets. There was mild to moderate stenosis. There was trivial   regurgitation. Valve area (VTI): 0.56 cm^2. Valve area (Vmax):   0.64 cm^2. Valve area (Vmean): 0.56 cm^2. - Mitral valve: Calcified annulus. There was moderate   regurgitation. Two distinct jets noted. - Left atrium: The atrium was mildly dilated. - Right ventricle: Systolic function was moderately reduced. - Atrial septum: No defect or patent foramen ovale was identified. - Pulmonary arteries: PA peak pressure: 28 mm Hg (S). - Inferior vena cava: The vessel was dilated. The respirophasic   diameter changes were blunted (< 50%), consistent with elevated   central venous pressure.   Patient Profile     68 y.o. female with history of Aortic  stenosis presents with acute bronchitis and new onset Afib with RVR.   Assessment & Plan    1.  New onset atrial fibrillation with RVR -HR remains elevated despite very high dose of Cardizem CD and lopressor -Atrial fibrillation likely triggered by respiratory infection -2D echo a year ago showed normal LV function with EF 60 to 65% but now down to 40-45%.   -Started on Eliquis 5 mg twice daily (age < 76, weight > 60kg and Creatinine < 1.5) forCADS2VASC score of 3 (age 69, female and HTN). -stopped ASA since starting DOAC -TSH 0.991 -Continue Cardizem CD 300mg  daily and Lopressor to 50mg  BID  -She is scheduled for TEE/DCCV today  2.  Exertional shortness of breath -Likely related to underlying URI with chest x-ray showing left lower lobe bronchopneumonia and diffuse peribronchial cuffing concerning for bronchitis as well as afib with RVR.  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  Moderate to severe aortic stenosis -Repeat 2D echo showed peak AV velocity 274cm/s with mean AV gradient 13mmHg and AVA 0.64cm2.  Dimensionless index c/w severe AS at 0.18 -She has been evaluated in the past by Dr. Burt Knack and was supposed to follow-up with an echo earlier this year -TEE will help with further assessment -will get back in NSR and then further workup with Structural heart team  4.  Hypertension -BP stable at 133/90 mmHg -ARB and HCTZ on hold due to AKI.  -Will manage with diltiazem and metoprolol.   5.  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

## 2018-12-06 NOTE — Progress Notes (Addendum)
    Transesophageal Echocardiogram Note  Tracy James [id number] [birth year]  Procedure: Transesophageal Echocardiogram Indications: Atrial fibrillation   Procedure Details Consent: Obtained Time Out: Verified patient identification, verified procedure, site/side was marked, verified correct patient position, special equipment/implants available, Radiology Safety Procedures followed,  medications/allergies/relevent history reviewed, required imaging and test results available.  Performed  Medications:  Pt sedated by anesthesia with lidocaine 60 mg and diprovan 120 mg IV  Moderate to severe LV dysfunction; moderate biatrial enlargement; large thrombus in LAA; mobile densities in RA likely thrombus; severe RV dysfunction; thickened aortic valve with reduced cusp excursion (AS cannot be quantified due to tachycardic-HR 140); mild AI, MR and TR; small effusion; possible thrombus in pericardial space.  DCCV canceled; continue anticoagulation.  Complications: No apparent complications Patient did tolerate procedure well.  Kirk Ruths, MD

## 2018-12-06 NOTE — Progress Notes (Signed)
TEE images reviewed with Dr. Stanford Breed. Patient has large clot burden in the LA, LAA and RA.  There is also a ? Mass vs.Thrombus in the pericardial space. LV dysfunction appears worse than 40%.  She remains in afib with RVR.  For now will keep her on Cardizem despite LV dysfunction.  I am leery about starting IV Amio due to risk of conversion to NSR with very large clot burden in LA/LAA.  Will increase lopressor to 50mg  BID for better rate control.  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  Continue Eliquis. 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  Unfortunately her creatinine precludes cardiac MRI with contrast at this time.

## 2018-12-06 NOTE — Care Management Important Message (Signed)
Important Message  Patient Details  Name: Tracy James MRN: [id number] Date of Birth: March 28, 1951   Medicare Important Message Given:  Yes    Barb Merino Montgomery 12/06/2018, 4:54 PM

## 2018-12-06 NOTE — Interval H&P Note (Signed)
History and Physical Interval Note:  12/06/2018 11:45 AM  Tracy James  has presented today for surgery, with the diagnosis of A-FIB  The various methods of treatment have been discussed with the patient and family. After consideration of risks, benefits and other options for treatment, the patient has consented to  Procedure(s): TRANSESOPHAGEAL ECHOCARDIOGRAM (TEE) (N/A) CARDIOVERSION (N/A) as a surgical intervention .  The patient's history has been reviewed, patient examined, no change in status, stable for surgery.  I have reviewed the patient's chart and labs.  Questions were answered to the patient's satisfaction.     Kirk Ruths

## 2018-12-06 NOTE — Transfer of Care (Signed)
Immediate Anesthesia Transfer of Care Note  Patient: Tracy James  Procedure(s) Performed: TRANSESOPHAGEAL ECHOCARDIOGRAM (TEE) (N/A )  Patient Location: PACU and Endoscopy Unit  Anesthesia Type:MAC  Level of Consciousness: awake, alert  and oriented  Airway & Oxygen Therapy: Patient Spontanous Breathing  Post-op Assessment: Report given to RN and Post -op Vital signs reviewed and stable  Post vital signs: Reviewed and stable  Last Vitals:  Vitals Value Taken Time  BP    Temp    Pulse    Resp    SpO2      Last Pain:  Vitals:   12/06/18 1143  TempSrc: Oral  PainSc: 0-No pain         Complications: No apparent anesthesia complications

## 2018-12-06 NOTE — Progress Notes (Signed)
  Echocardiogram Echocardiogram Transesophageal has been performed.  Tracy James 12/06/2018, 1:15 PM

## 2018-12-06 NOTE — Anesthesia Preprocedure Evaluation (Addendum)
Anesthesia Evaluation  Patient identified by MRN, date of birth, ID band Patient awake    Reviewed: Allergy & Precautions, NPO status , Patient's Chart, lab work & pertinent test results  Airway Mallampati: I       Dental no notable dental hx. (+) Teeth Intact   Pulmonary former smoker,    Pulmonary exam normal breath sounds clear to auscultation       Cardiovascular hypertension, Pt. on medications  Rhythm:Irregular Rate:Tachycardia     Neuro/Psych negative neurological ROS  negative psych ROS   GI/Hepatic negative GI ROS, Neg liver ROS,   Endo/Other  negative endocrine ROS  Renal/GU      Musculoskeletal   Abdominal Normal abdominal exam  (+)   Peds  Hematology negative hematology ROS (+)   Anesthesia Other Findings ------------------------------------------------------------------- LV EF: 40% -   45%  ------------------------------------------------------------------- Indications:      Atrial fibrillation - 427.31.  ------------------------------------------------------------------- History:   PMH:  Pneumonia.  Atrial fibrillation.  Aortic valve disease.  Risk factors:  Hypertension.  ------------------------------------------------------------------- Study Conclusions  - Left ventricle: The cavity size was normal. Wall thickness was   increased in a pattern of mild LVH. Systolic function was mildly   to moderately reduced. The estimated ejection fraction was in the   range of 40% to 45%. Diffuse hypokinesis. The study was not   technically sufficient to allow evaluation of LV diastolic   dysfunction due to atrial fibrillation. Doppler parameters are   consistent with high ventricular filling pressure. - Aortic valve: Trileaflet; mildly thickened, mildly calcified   leaflets. There was mild to moderate stenosis. There was trivial   regurgitation. Valve area (VTI): 0.56 cm^2. Valve area (Vmax):  0.64 cm^2. Valve area (Vmean): 0.56 cm^2. - Mitral valve: Calcified annulus. There was moderate   regurgitation. Two distinct jets noted. - Left atrium: The atrium was mildly dilated. - Right ventricle: Systolic function was moderately reduced. - Atrial septum: No defect or patent foramen ovale was identified. - Pulmonary arteries: PA peak pressure: 28 mm Hg (S). - Inferior vena cava: The vessel was dilated. The respirophasic   diameter changes were blunted (< 50%), consistent with elevated   central venous pressure.  ------------------------------------------------------------------- Study data:  Comparison   Reproductive/Obstetrics                             Anesthesia Physical Anesthesia Plan  ASA: II  Anesthesia Plan: MAC   Post-op Pain Management:    Induction: Intravenous  PONV Risk Score and Plan:   Airway Management Planned: Natural Airway and Mask  Additional Equipment:   Intra-op Plan:   Post-operative Plan:   Informed Consent: I have reviewed the patients History and Physical, chart, labs and discussed the procedure including the risks, benefits and alternatives for the proposed anesthesia with the patient or authorized representative who has indicated his/her understanding and acceptance.   Dental advisory given  Plan Discussed with: CRNA  Anesthesia Plan Comments:        Anesthesia Quick Evaluation

## 2018-12-06 NOTE — H&P (View-Only) (Signed)
Progress Note  Patient Name: Tracy James Date of Encounter: 12/06/2018  Primary Cardiologist: Sherren Mocha, MD   Subjective   Denies any chest pain or SOB.  Having episodes of rapid A. fib up in the 130s to 140s  Inpatient Medications    Scheduled Meds: . apixaban  5 mg Oral Q12H  . diltiazem  300 mg Oral Daily  . metoprolol tartrate  25 mg Oral BID   Continuous Infusions:  PRN Meds: acetaminophen, ondansetron (ZOFRAN) IV   Vital Signs    Vitals:   12/05/18 1922 12/05/18 2325 12/06/18 0448 12/06/18 0755  BP: 107/78 119/87 129/82 133/90  Pulse: 72 (!) 56 (!) 107 80  Resp:    18  Temp: (!) 97.5 F (36.4 C) 98 F (36.7 C) 97.8 F (36.6 C) 97.9 F (36.6 C)  TempSrc: Oral Oral Oral Oral  SpO2: 100% 100% 96% 100%  Weight:   79.1 kg   Height:        Intake/Output Summary (Last 24 hours) at 12/06/2018 9935 Last data filed at 12/05/2018 1700 Gross per 24 hour  Intake 780 ml  Output -  Net 780 ml   Filed Weights   12/03/18 1249 12/05/18 0616 12/06/18 0448  Weight: 82.1 kg 80.2 kg 79.1 kg    Telemetry    Atrial fibrillation with RVR up to the 130s- Personally Reviewed  ECG    New EKG to review- Personally Reviewed  Physical Exam   GEN: Well nourished, well developed in no acute distress HEENT: Normal NECK: No JVD; No carotid bruits LYMPHATICS: No lymphadenopathy CARDIAC: Regularly irregular and tachycardic, no murmurs, rubs, gallops RESPIRATORY: Decreased breath sounds at right base otherwise clear ABDOMEN: Soft, non-tender, non-distended MUSCULOSKELETAL:  No edema; No deformity  SKIN: Warm and dry NEUROLOGIC:  Alert and oriented x 3 PSYCHIATRIC:  Normal affect    Labs    Chemistry Recent Labs  Lab 12/04/18 0859 12/05/18 0323 12/06/18 0452  NA 138 137 137  K 4.1 4.4 4.3  CL 107 108 108  CO2 18* 17* 19*  GLUCOSE 137* 113* 126*  BUN 31* 37* 36*  CREATININE 1.35* 1.60* 1.63*  CALCIUM 9.0 8.9 9.3  GFRNONAA 41* 33* 33*  GFRAA 48* 39*  38*  ANIONGAP 13 12 10      Hematology Recent Labs  Lab 12/04/18 0859 12/05/18 0323 12/06/18 0452  WBC 10.1 10.9* 11.3*  RBC 4.18 4.02 4.31  HGB 12.8 12.9 13.2  HCT 40.0 38.9 41.1  MCV 95.7 96.8 95.4  MCH 30.6 32.1 30.6  MCHC 32.0 33.2 32.1  RDW 14.4 14.3 14.0  PLT 264 281 299    Cardiac Enzymes Recent Labs  Lab 12/03/18 1646 12/03/18 2306 12/04/18 0344  TROPONINI 0.03* 0.03* 0.03*    Recent Labs  Lab 12/03/18 1311  TROPIPOC 0.04     BNPNo results for input(s): BNP, PROBNP in the last 168 hours.   DDimer No results for input(s): DDIMER in the last 168 hours.   Radiology    No results found.  Cardiac Studies   2D echo 12/04/2018 Study Conclusions  - Left ventricle: The cavity size was normal. Wall thickness was   increased in a pattern of mild LVH. Systolic function was mildly   to moderately reduced. The estimated ejection fraction was in the   range of 40% to 45%. Diffuse hypokinesis. The study was not   technically sufficient to allow evaluation of LV diastolic   dysfunction due to atrial fibrillation. Doppler parameters are  consistent with high ventricular filling pressure. - Aortic valve: Trileaflet; mildly thickened, mildly calcified   leaflets. There was mild to moderate stenosis. There was trivial   regurgitation. Valve area (VTI): 0.56 cm^2. Valve area (Vmax):   0.64 cm^2. Valve area (Vmean): 0.56 cm^2. - Mitral valve: Calcified annulus. There was moderate   regurgitation. Two distinct jets noted. - Left atrium: The atrium was mildly dilated. - Right ventricle: Systolic function was moderately reduced. - Atrial septum: No defect or patent foramen ovale was identified. - Pulmonary arteries: PA peak pressure: 28 mm Hg (S). - Inferior vena cava: The vessel was dilated. The respirophasic   diameter changes were blunted (< 50%), consistent with elevated   central venous pressure.   Patient Profile     68 y.o. female with history of Aortic  stenosis presents with acute bronchitis and new onset Afib with RVR.   Assessment & Plan    1.  New onset atrial fibrillation with RVR -HR remains elevated despite very high dose of Cardizem CD and lopressor -Atrial fibrillation likely triggered by respiratory infection -2D echo a year ago showed normal LV function with EF 60 to 65% but now down to 40-45%.   -Started on Eliquis 5 mg twice daily (age < 36, weight > 60kg and Creatinine < 1.5) forCADS2VASC score of 3 (age 89, female and HTN). -stopped ASA since starting DOAC -TSH 0.991 -Continue Cardizem CD 300mg  daily and Lopressor to 50mg  BID  -She is scheduled for TEE/DCCV today  2.  Exertional shortness of breath -Likely related to underlying URI with chest x-ray showing left lower lobe bronchopneumonia and diffuse peribronchial cuffing concerning for bronchitis as well as afib with RVR.  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  Moderate to severe aortic stenosis -Repeat 2D echo showed peak AV velocity 274cm/s with mean AV gradient 6mmHg and AVA 0.64cm2.  Dimensionless index c/w severe AS at 0.18 -She has been evaluated in the past by Dr. Burt Knack and was supposed to follow-up with an echo earlier this year -TEE will help with further assessment -will get back in NSR and then further workup with Structural heart team  4.  Hypertension -BP stable at 133/90 mmHg -ARB and HCTZ on hold due to AKI.  -Will manage with diltiazem and metoprolol.   5.  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

## 2018-12-07 DIAGNOSIS — I248 Other forms of acute ischemic heart disease: Secondary | ICD-10-CM

## 2018-12-07 DIAGNOSIS — R000 Tachycardia, unspecified: Secondary | ICD-10-CM

## 2018-12-07 DIAGNOSIS — I43 Cardiomyopathy in diseases classified elsewhere: Secondary | ICD-10-CM

## 2018-12-07 LAB — CBC
HCT: 39.4 % (ref 36.0–46.0)
Hemoglobin: 12.6 g/dL (ref 12.0–15.0)
MCH: 30.6 pg (ref 26.0–34.0)
MCHC: 32 g/dL (ref 30.0–36.0)
MCV: 95.6 fL (ref 80.0–100.0)
Platelets: 274 10*3/uL (ref 150–400)
RBC: 4.12 MIL/uL (ref 3.87–5.11)
RDW: 13.8 % (ref 11.5–15.5)
WBC: 8.7 10*3/uL (ref 4.0–10.5)
nRBC: 0 % (ref 0.0–0.2)

## 2018-12-07 LAB — BASIC METABOLIC PANEL
Anion gap: 8 (ref 5–15)
BUN: 32 mg/dL — AB (ref 8–23)
CO2: 20 mmol/L — ABNORMAL LOW (ref 22–32)
Calcium: 9 mg/dL (ref 8.9–10.3)
Chloride: 108 mmol/L (ref 98–111)
Creatinine, Ser: 1.56 mg/dL — ABNORMAL HIGH (ref 0.44–1.00)
GFR calc Af Amer: 40 mL/min — ABNORMAL LOW (ref >60)
GFR calc non Af Amer: 35 mL/min — ABNORMAL LOW (ref >60)
GLUCOSE: 106 mg/dL — AB (ref 70–99)
POTASSIUM: 4 mmol/L (ref 3.5–5.1)
Sodium: 136 mmol/L (ref 135–145)

## 2018-12-07 MED ORDER — APIXABAN 5 MG PO TABS: 5.0000 mg | ORAL_TABLET | Freq: Two times a day (BID) | ORAL | 0 refills | Status: DC

## 2018-12-07 MED ORDER — METOPROLOL TARTRATE 50 MG PO TABS: 50.0000 mg | ORAL_TABLET | Freq: Two times a day (BID) | ORAL | 0 refills | Status: DC

## 2018-12-07 MED ORDER — DILTIAZEM HCL ER COATED BEADS 300 MG PO CP24: 300.0000 mg | ORAL_CAPSULE | Freq: Every day | ORAL | 0 refills | Status: DC

## 2018-12-07 NOTE — Progress Notes (Signed)
Family Medicine Teaching Service Daily Progress Note Intern Pager: [phone]  Patient name: ARLYNN STARE Medical record number: [id number] Date of birth: 13-May-1951 Age: 68 y.o. Gender: female  Primary Care Provider: Antony Blackbird, MD Consultants: Cardiology  Code Status: Full   Pt Overview and Major Events to Date:  Admitted to [facility] on 12/03/18  Assessment and Plan: RIYANA BIEL a 68 y.o.femalepresenting with 1 week of shortness of breath and fatigue. PMH is significant forHTN, heart murmur, allergies.  New onset A-fibrillation New diagnosis. Echo from 1/1 showing EF 40-45%, mild to moderate AS, moderate regurg. 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 LV dysfunction worse than 40%. Patient remains in A fib with RVR. Cardiology keeping patient on cardizem and increase lopressor to 50mg  BID for better rate control as well as continue eliquis. Creatinine precludes cardiac MRI with contrast at this time inhibiting finding the etiology of the pericardial space mass. Patient HR is controlled intermittently with tachycardia periodically.  -Continue Eliquis -Continue telemetry -Continue Cardizem 300mg  daily -Continue metoprolol 50mg  bid  - f/u cards recs  AKI on CKD stage IIIa  Improved Cr 1.56 today. If mproves, consider cardiac MRI -Hold HCTZ -Hold losartan -Continue to monitor -avoid nephrotoxic agents  Lower respiratory tract infection Chest x-ray shows left lower lobe bronchopneumonia and with diffuse peribronchial cuffing. RVP negative. Abx discontinued after 1 day. Strep pneumo negative. Legionella urinary antigen negative -s/p Ceftriaxone x1 and Azithromycin500 mg -monitor for signs of clinical worsening  Chronic HTN- increased BP overnight 106-149/81-98 Stable. Home medications include hydrochlorothiazide and losartan. Holding home anti-hypertensive medication at this time due to mild  AKI. -Hold HCTZ - consider restarting losartan or increasing metoprolol   FEN/GI:heart healthy diet  Prophylaxis: eliquis   Disposition: pending cardiology clearance  Subjective:  Patient is in good spirits and feels well today. She denies chest pain, SOB, DOE, or orthopnea. She does sleep elevated at night and has slept propped on 2 pillows for several years - per patient 2/2 GERD prevention. Her request today is to be able to walk in the halls which I said was fine and we talked about fall/bleeding risks on blood thinners. She also wants to talk to the cardiologist for discharge planning.  Objective: Temp:  [97.5 F (36.4 C)-97.9 F (36.6 C)] 97.9 F (36.6 C) (01/04 0425) Pulse Rate:  [35-110] 110 (01/04 0425) Resp:  [12-18] 16 (01/04 0425) BP: (106-149)/(81-98) 149/96 (01/04 0425) SpO2:  [96 %-100 %] 96 % (01/04 0425) Weight:  [79.1 kg-80.2 kg] 80.2 kg (01/04 0425) Physical Exam: General: awake and alert, sitting up in bed, NAD  Cardiovascular: irregular rhythm, no MRG  Respiratory: CTAB, no wheezes, rales or rhonchi  Abdomen: soft, non tender, non distended, bowel sounds normal  Extremities: no edema, no rashes  Laboratory: Recent Labs  Lab 12/05/18 0323 12/06/18 0452 12/07/18 0333  WBC 10.9* 11.3* 8.7  HGB 12.9 13.2 12.6  HCT 38.9 41.1 39.4  PLT 281 299 274   Recent Labs  Lab 12/05/18 0323 12/06/18 0452 12/07/18 0333  NA 137 137 136  K 4.4 4.3 4.0  CL 108 108 108  CO2 17* 19* 20*  BUN 37* 36* 32*  CREATININE 1.60* 1.63* 1.56*  CALCIUM 8.9 9.3 9.0  GLUCOSE 113* 126* 106*     Imaging/Diagnostic Tests:  TEE 12/06/2018: Moderate to severe LV dysfunction; moderate biatrial enlargement; large thrombus in LAA; mobile densities in RA likely  thrombus; severe RV dysfunction; thickened aortic valve with reduced cusp excursion (AS cannot be quantified due to tachycardic-HR 140); mild AI, MR and TR; small effusion; possible thrombus in pericardial space.  Richarda Osmond, DO 12/07/2018, 7:32 AM PGY-1, [place] Intern pager: [phone], text pages welcome

## 2018-12-07 NOTE — Progress Notes (Addendum)
Progress Note  Patient Name: Tracy James Date of Encounter: 12/07/2018  Primary Cardiologist: Sherren Mocha, MD   Subjective   Feels well, sitting up in chair.  She walked earlier this morning without any shortness of breath.  Heart rate did increase during activity.  Currently in chair she is in the 80s.  Inpatient Medications    Scheduled Meds: . apixaban  5 mg Oral Q12H  . diltiazem  300 mg Oral Daily  . metoprolol tartrate  50 mg Oral BID   Continuous Infusions:  PRN Meds: acetaminophen, ondansetron (ZOFRAN) IV   Vital Signs    Vitals:   12/07/18 0008 12/07/18 0425 12/07/18 0849 12/07/18 0900  BP: 109/81 (!) 149/96 (!) 137/117   Pulse: 86 (!) 110 92   Resp: 16 16    Temp:  97.9 F (36.6 C)  97.8 F (36.6 C)  TempSrc:  Oral  Oral  SpO2: 98% 96% 100%   Weight:  80.2 kg    Height:        Intake/Output Summary (Last 24 hours) at 12/07/2018 1047 Last data filed at 12/06/2018 1922 Gross per 24 hour  Intake 900 ml  Output 0 ml  Net 900 ml   Filed Weights   12/06/18 0448 12/06/18 1143 12/07/18 0425  Weight: 79.1 kg 79.1 kg 80.2 kg    Telemetry    80s currently, atrial fibrillation, did increase to the 120s during exertional activity, to be expected- Personally Reviewed  ECG    Atrial fibrillation- Personally Reviewed  Physical Exam   GEN: No acute distress.   Neck: No JVD Cardiac:  Regularly irregular normal rate, 2/6 systolic right upper sternal border murmur, no rubs, or gallops.  Respiratory: Clear to auscultation bilaterally. GI: Soft, nontender, non-distended  MS: No edema; No deformity. Neuro:  Nonfocal  Psych: Normal affect   Labs    Chemistry Recent Labs  Lab 12/05/18 0323 12/06/18 0452 12/07/18 0333  NA 137 137 136  K 4.4 4.3 4.0  CL 108 108 108  CO2 17* 19* 20*  GLUCOSE 113* 126* 106*  BUN 37* 36* 32*  CREATININE 1.60* 1.63* 1.56*  CALCIUM 8.9 9.3 9.0  GFRNONAA 33* 33* 35*  GFRAA 39* 38* 40*  ANIONGAP 12 10 8       Hematology Recent Labs  Lab 12/05/18 0323 12/06/18 0452 12/07/18 0333  WBC 10.9* 11.3* 8.7  RBC 4.02 4.31 4.12  HGB 12.9 13.2 12.6  HCT 38.9 41.1 39.4  MCV 96.8 95.4 95.6  MCH 32.1 30.6 30.6  MCHC 33.2 32.1 32.0  RDW 14.3 14.0 13.8  PLT 281 299 274    Cardiac Enzymes Recent Labs  Lab 12/03/18 1646 12/03/18 2306 12/04/18 0344  TROPONINI 0.03* 0.03* 0.03*    Recent Labs  Lab 12/03/18 1311  TROPIPOC 0.04     BNPNo results for input(s): BNP, PROBNP in the last 168 hours.   DDimer No results for input(s): DDIMER in the last 168 hours.   Radiology    No results found.  Cardiac Studies   Transthoracic echocardiogram 12/04/2018: - Left ventricle: The cavity size was normal. Wall thickness was   increased in a pattern of mild LVH. Systolic function was mildly   to moderately reduced. The estimated ejection fraction was in the   range of 40% to 45%. Diffuse hypokinesis. The study was not   technically sufficient to allow evaluation of LV diastolic   dysfunction due to atrial fibrillation. Doppler parameters are   consistent with high  ventricular filling pressure. - Aortic valve: Trileaflet; mildly thickened, mildly calcified   leaflets. There was mild to moderate stenosis. There was trivial   regurgitation. Valve area (VTI): 0.56 cm^2. Valve area (Vmax):   0.64 cm^2. Valve area (Vmean): 0.56 cm^2. - Mitral valve: Calcified annulus. There was moderate   regurgitation. Two distinct jets noted. - Left atrium: The atrium was mildly dilated. - Right ventricle: Systolic function was moderately reduced. - Atrial septum: No defect or patent foramen ovale was identified. - Pulmonary arteries: PA peak pressure: 28 mm Hg (S). - Inferior vena cava: The vessel was dilated. The respirophasic   diameter changes were blunted (< 50%), consistent with elevated   central venous pressure.   Transesophageal echocardiogram 12/06/2018 - Left ventricle: Systolic function was moderately  to severely   reduced. The estimated ejection fraction was in the range of 30%   to 35%. Diffuse hypokinesis. - Aortic valve: Cusp separation was severely reduced. No evidence   of vegetation. There was mild regurgitation. - Mitral valve: No evidence of vegetation. There was mild   regurgitation. - Left atrium: The atrium was moderately dilated. There was a   thrombusin the appendage. - Right ventricle: Systolic function was severely reduced. - Right atrium: The atrium was moderately dilated. There was a   thrombusin the atrial cavity. - Atrial septum: No defect or patent foramen ovale was identified. - Tricuspid valve: No evidence of vegetation. - Pulmonic valve: No evidence of vegetation. - Pericardium, extracardiac: A small pericardial effusion was   identified.  Impressions:  - Moderate to severe LV dysfunction; thickened aortic valve with   reduced cusp excursion (AS not quantified due to HR 140 and   atrial fibrillation); mild AI; mild MR; moderate LAE with large   LAA thrombus; severe RV dysfunction; moderate RAE with mobile   densities concerning for thrombus; mild TR; small pericardial   effusion with possible thrombus in pericardial space.  Patient Profile     68 y.o. female with left atrial appendage thrombus right atrial thrombus, pericardial type thrombus with new onset atrial fibrillation, moderate aortic stenosis  Assessment & Plan    New onset persistent atrial fibrillation with rapid ventricular response and moderately reduced ejection fraction 40 to 45% on TTE EF, TEE in the setting of rapid ventricular response appeared lower EF at 35%.  RA/LA thrombus - Unable to cardiovert secondary to large clot burden in the left atrial appendage left atrium, right atrium as well as possible clot adjacent to heart. -Continue with Eliquis, chads vasc3. - Currently on both diltiazem as well as metoprolol secondary to presumable tachycardia induced cardiomyopathy.  Her  metoprolol was just increased to 50 mg last night.  If necessary, can increase to 75 mg twice a day. - After 1 month of uninterrupted anticoagulation, if she is still in atrial fibrillation, we can repeat TEE and attempt cardioversion if thrombus burden is resolved.  I would feel more comfortable proceeding with another TEE given the large clot burden previously seen. - Agree with Dr. Radford Pax, avoiding amiodarone, do not want to try to chemically convert her in the setting of her clot burden.  Moderate aortic stenosis - She has been evaluated by Dr. Burt Knack.  Continue with management of her atrial fibrillation at this point.  Essential hypertension - Recently angiotensin receptor blocker and HCTZ were on hold secondary to AKI.  Continue with diltiazem and metoprolol.  Currently stable.  Chest pain - Atypical, pleuritic-like.  Troponins negative.  Remember her EF is  reduced to 45% may be tachycardia mediated.  This will be repeated in the future.  Pain has resolved.  Elevated troponin -Low level flat, demand ischemia in the setting of her atrial fibrillation with rapid ventricular response.  Not acute coronary syndrome.  No further recommendations at this time. I think it be reasonable to let her go home on metoprolol 50 mg twice a day.  Eliquis 5 mg twice a day.  She seems eager to go home, appears well currently.  I do not see any reason she could not drive currently.  Complex medical decision making.  Extensive review of medical records.  For questions or updates, please contact [place] Please consult [email or web address] for contact info under        Signed, Candee Furbish, MD  12/07/2018, 10:47 AM

## 2018-12-08 ENCOUNTER — Encounter (HOSPITAL_COMMUNITY): Payer: Self-pay | Admitting: Cardiology

## 2018-12-17 ENCOUNTER — Telehealth: Payer: Self-pay | Admitting: Family Medicine

## 2018-12-17 NOTE — Telephone Encounter (Signed)
Patient verified DOB Patient was advised to report to the emergency room based on the SOB and fatigue. Patient was advised prior to d/c of signs and symptoms to monitor for medical concerns.  Please ask patient to report to the ED if symptoms have persisted or increased.

## 2018-12-17 NOTE — Telephone Encounter (Signed)
Pt called stating she never spoke with a nurse about her SOB and fatigue. She was advised to report to the ER or urgent care if the symptoms have not been better. Please follow up

## 2018-12-17 NOTE — Telephone Encounter (Signed)
Pt called stating she has not felt better since she came in to the office and after spending 5 days in the Hospital room, she has her appointment set but would like to discuss her persistent SOB and low energy symptoms. Please follow up as soon as possible

## 2018-12-18 NOTE — Telephone Encounter (Signed)
Patient verified DOB MA spoke with patient this morning to triage her SOB and fatigue. Patient did not sound in distress over the phone. Patient denied "elephant on the chest" numbing of the left arm. Patient states she feels the same as when she was discharged (which was a stable report). MA advised patient again to report to the ED if symptoms worsened and to rest for the time being, not over exerting herself. No further questions at this time.

## 2019-01-01 ENCOUNTER — Other Ambulatory Visit: Payer: Self-pay | Admitting: Family Medicine

## 2019-01-01 MED ORDER — METOPROLOL TARTRATE 50 MG PO TABS: 50.0000 mg | ORAL_TABLET | Freq: Two times a day (BID) | ORAL | 0 refills | Status: DC

## 2019-01-01 MED ORDER — APIXABAN 5 MG PO TABS: 5.0000 mg | ORAL_TABLET | Freq: Two times a day (BID) | ORAL | 0 refills | Status: DC

## 2019-01-01 MED ORDER — DILTIAZEM HCL ER COATED BEADS 300 MG PO CP24: 300.0000 mg | ORAL_CAPSULE | Freq: Every day | ORAL | 0 refills | Status: DC

## 2019-01-01 NOTE — Telephone Encounter (Signed)
1) Medication(s) Requested (by name): -diltiazem (CARDIZEM CD) 300 MG 24 hr capsule  -apixaban (ELIQUIS) 5 MG TABS tablet  -metoprolol tartrate (LOPRESSOR) 50 MG tablet   2) Pharmacy of Choice: -[place], [place] 3) Special Requests:   Approved medications will be sent to the pharmacy, we will reach out if there is an issue.  Requests made after 3pm may not be addressed until the following business day!  If a patient is unsure of the name of the medication(s) please note and ask patient to call back when they are able to provide all info, do not send to responsible party until all information is available!

## 2019-01-06 ENCOUNTER — Ambulatory Visit: Payer: Medicare Other | Admitting: Family Medicine

## 2019-01-06 ENCOUNTER — Other Ambulatory Visit: Payer: Self-pay

## 2019-01-06 NOTE — Telephone Encounter (Signed)
I was notified by Rolm Gala at the front desk that the patient called concerning her Eliquis refill. She said that [facility] didn't have it, the pharmacy confirmed they did have it but it was too soon to fill on 01/01/19 but it can be filled now, they will have her rx ready for pickup in one hour.  Patient was notified.

## 2019-01-15 ENCOUNTER — Ambulatory Visit: Payer: Medicare Other | Attending: Family Medicine | Admitting: Critical Care Medicine

## 2019-01-15 ENCOUNTER — Other Ambulatory Visit: Payer: Self-pay

## 2019-01-15 ENCOUNTER — Encounter: Payer: Self-pay | Admitting: Critical Care Medicine

## 2019-01-15 VITALS — BP 121/83 | HR 82 | Temp 97.8°F | Resp 20 | Wt 177.0 lb

## 2019-01-15 DIAGNOSIS — N183 Chronic kidney disease, stage 3 (moderate): Secondary | ICD-10-CM | POA: Insufficient documentation

## 2019-01-15 DIAGNOSIS — Z23 Encounter for immunization: Secondary | ICD-10-CM | POA: Diagnosis not present

## 2019-01-15 DIAGNOSIS — Z87891 Personal history of nicotine dependence: Secondary | ICD-10-CM | POA: Diagnosis not present

## 2019-01-15 DIAGNOSIS — Z800 Family history of malignant neoplasm of digestive organs: Secondary | ICD-10-CM | POA: Insufficient documentation

## 2019-01-15 DIAGNOSIS — Z833 Family history of diabetes mellitus: Secondary | ICD-10-CM | POA: Insufficient documentation

## 2019-01-15 DIAGNOSIS — Z8249 Family history of ischemic heart disease and other diseases of the circulatory system: Secondary | ICD-10-CM | POA: Insufficient documentation

## 2019-01-15 DIAGNOSIS — Z801 Family history of malignant neoplasm of trachea, bronchus and lung: Secondary | ICD-10-CM | POA: Insufficient documentation

## 2019-01-15 DIAGNOSIS — E781 Pure hyperglyceridemia: Secondary | ICD-10-CM | POA: Diagnosis not present

## 2019-01-15 DIAGNOSIS — R9430 Abnormal result of cardiovascular function study, unspecified: Secondary | ICD-10-CM

## 2019-01-15 DIAGNOSIS — I4819 Other persistent atrial fibrillation: Secondary | ICD-10-CM | POA: Diagnosis not present

## 2019-01-15 DIAGNOSIS — Z79899 Other long term (current) drug therapy: Secondary | ICD-10-CM | POA: Insufficient documentation

## 2019-01-15 DIAGNOSIS — I513 Intracardiac thrombosis, not elsewhere classified: Secondary | ICD-10-CM | POA: Insufficient documentation

## 2019-01-15 DIAGNOSIS — I4891 Unspecified atrial fibrillation: Secondary | ICD-10-CM

## 2019-01-15 DIAGNOSIS — I350 Nonrheumatic aortic (valve) stenosis: Secondary | ICD-10-CM | POA: Insufficient documentation

## 2019-01-15 DIAGNOSIS — Z7901 Long term (current) use of anticoagulants: Secondary | ICD-10-CM | POA: Insufficient documentation

## 2019-01-15 DIAGNOSIS — I129 Hypertensive chronic kidney disease with stage 1 through stage 4 chronic kidney disease, or unspecified chronic kidney disease: Secondary | ICD-10-CM | POA: Insufficient documentation

## 2019-01-15 DIAGNOSIS — I10 Essential (primary) hypertension: Secondary | ICD-10-CM

## 2019-01-15 MED ORDER — DILTIAZEM HCL ER COATED BEADS 300 MG PO CP24: 300.0000 mg | ORAL_CAPSULE | Freq: Every day | ORAL | 3 refills | Status: DC

## 2019-01-15 MED ORDER — APIXABAN 5 MG PO TABS: 5.0000 mg | ORAL_TABLET | Freq: Two times a day (BID) | ORAL | 3 refills | Status: DC

## 2019-01-15 MED ORDER — METOPROLOL TARTRATE 50 MG PO TABS: 50.0000 mg | ORAL_TABLET | Freq: Two times a day (BID) | ORAL | 3 refills | Status: DC

## 2019-01-15 NOTE — Assessment & Plan Note (Signed)
The patient is no longer in rapid ventricular response

## 2019-01-15 NOTE — Assessment & Plan Note (Signed)
History of heart murmur in the past now with documented aortic stenosis and aortic regurgitation  Ejection fraction 40 to 45%  Left atrial right atrial thrombus seen  We will continue anticoagulation and rate control with cardiology follow-up pending

## 2019-01-15 NOTE — Patient Instructions (Signed)
Refills on Cardizem, metoprolol, and Eliquis were sent to your pharmacy Continue to stay off your losartan and hydrochlorthiazide for now until we get your laboratory studies back We will make a referral to cardiology to get you in as soon as possible to consider your cardioversion A Pneumovax was offered to you today for pneumonia vaccine I would hold off scheduling your colonoscopy until your cardioversion and heart status is more stable Labs today will include a basic metabolic panel blood counts and a cholesterol test We will get you back into see Dr. Chapman Fitch within 1 month

## 2019-01-15 NOTE — Assessment & Plan Note (Signed)
Blood pressure currently is well controlled on Cardizem and metoprolol  We will not restart losartan or HCTZ at this time pending a basic metabolic panel recheck

## 2019-01-15 NOTE — Assessment & Plan Note (Signed)
Low ejection fraction 40 to 45% on recent echocardiogram secondary to atrial fibrillation and aortic stenosis  Follow-up per cardiology

## 2019-01-15 NOTE — Progress Notes (Signed)
Subjective:    Patient ID: Tracy James, female    DOB: Sep 05, 1951, 68 y.o.   MRN: [id number]  68 y.o.F here for post hosp f/u Admitted end of Dec 2019 for exertional chest pain.  Hx of AS, HTN, Afib, hypertriglyceridemia  This patient was admitted in December for exertional chest pain found to be in atrial fibrillation with rapid ventricular response.  Work-up also showed aortic stenosis and regurgitation.  The patient was given intravenous heparin and diltiazem and then switched over to oral medications to include Eliquis metoprolol and Cardizem.  The patient had renal insufficiency so the losartan and HCTZ were discontinued.  The patient cannot undergo cardioversion as there was thrombus in the left atrium right atrium areas.  She needs to maintain anticoagulation for 1 month which is now past and now needs a cardiology follow-up visit.  There is a scheduled visit with cardiology on 14 February as noted.  Below is an excerpt from her discharge summary Date of Admission: 12/03/2018                    Date of Discharge: 12/07/2018 Admitting Physician: Dickie La, MD  Primary Care Provider: Antony Blackbird, MD Consultants: cardiology  Indication for Hospitalization: acute onset Afib with RVR  Discharge Diagnoses/Problem List:  Acute onset A fib with RVR AKI on CKD stage IIIa cHTN  HLD Aortic stenosis Overweight  Brief Hospital Course:  Tracy James is a 68 y.o. female presenting for 1 week SOB and fatigue. Patient was found to be in Afib with RVR in ED. New onset A fib thought to be 2/2 HTN, AS, and recent infection. Troponins trended with flat trend. Cardiology was consulted and performed TTE showing EF 40-45%, mild to moderate AS, moderate regurg. TEE was performed on 1/3 with plans to DCCV but was aborted when imaging showed large thrombus in LAA; mobile densities in RA likely thrombus and possible thrombus in pericardial space. Patient initially placed on heparin and dilt  gtt but transitioned to Eliquis and PO dilt + metoprolol for rate control. Cardiology plan is to optimize medical management and anticoagulation for 1 month and reassess with repeat TEE OP. If patient still in Afib, will consider DCCV at that time. On day of discharge, patient had stable vital signs and wa asymptomatic able to tolerate ambulation without SOB.  AKI with Cr on admission of 1.56 and 1.56 on day of discharge (baseline Cr from 10/04/2018 1.21). HCTZ and losartan were held for admission.   Note her ejection fraction was found to be 40 to 45%.  Since discharge she still is having fatigue and dyspnea on exertion and occasionally at night.  She is having no further chest pain.  She has been compliant with all of her medications.  Note she is due for colonoscopy and also a Pneumovax. Note the patient is no longer smoking tobacco for 5 years.  See shortness of breath assessment below  Shortness of Breath  This is a chronic problem. The current episode started more than 1 month ago. The problem occurs daily (worse at night ,  exertion at 64ft or to mailbox). The problem has been unchanged. Associated symptoms include orthopnea, PND, rhinorrhea, sputum production and wheezing. Pertinent negatives include no chest pain, claudication, ear pain, fever, headaches, hemoptysis, leg pain, leg swelling, neck pain, rash, sore throat, swollen glands, syncope or vomiting. The symptoms are aggravated by any activity, lying flat, exercise and emotional upset. Risk factors include smoking. There  is no history of allergies, asthma, CAD, chronic lung disease, COPD, DVT, a heart failure, PE or pneumonia.   Past Medical History:  Diagnosis Date  . Allergy    SEASONAL  . Heart murmur   . Hypertension      Family History  Problem Relation Age of Onset  . Arthritis Mother   . Cancer Mother        colon cancer  . Diabetes Mother   . Hypertension Mother   . Heart murmur Mother   . Cancer Brother         LUNG- smoking, [place]  . Early death Brother   . Heart murmur Brother   . Arthritis Maternal Grandmother   . Heart disease Maternal Grandmother        massive heart attack  . Heart disease Paternal Grandmother   . Asthma Brother   . Diabetes Brother   . Hypertension Brother   . Heart murmur Brother   . Heart disease Father        [email or web address]     Social History   Socioeconomic History  . Marital status: Divorced    Spouse name: Not on file  . Number of children: Not on file  . Years of education: Not on file  . Highest education level: Not on file  Occupational History  . Not on file  Social Needs  . Financial resource strain: Not on file  . Food insecurity:    Worry: Not on file    Inability: Not on file  . Transportation needs:    Medical: Not on file    Non-medical: Not on file  Tobacco Use  . Smoking status: Former Smoker    Last attempt to quit: 12/05/2011    Years since quitting: 7.1  . Smokeless tobacco: Never Used  Substance and Sexual Activity  . Alcohol use: Yes    Comment: occasionally  . Drug use: No  . Sexual activity: Not Currently    Birth control/protection: None  Lifestyle  . Physical activity:    Days per week: Not on file    Minutes per session: Not on file  . Stress: Not on file  Relationships  . Social connections:    Talks on phone: Not on file    Gets together: Not on file    Attends religious service: Not on file    Active member of club or organization: Not on file    Attends meetings of clubs or organizations: Not on file    Relationship status: Not on file  . Intimate partner violence:    Fear of current or ex partner: Not on file    Emotionally abused: Not on file    Physically abused: Not on file    Forced sexual activity: Not on file  Other Topics Concern  . Not on file  Social History Narrative  . Not on file     No Known Allergies   Outpatient Medications Prior to Visit  Medication Sig Dispense Refill  .  Calcium 600-200 MG-UNIT tablet Take 1 tablet by mouth daily.    . nitroGLYCERIN (NITROSTAT) 0.4 MG SL tablet Place 1 tablet (0.4 mg total) under the tongue every 5 (five) minutes as needed for chest pain. 20 tablet 0  . Omega-3 1000 MG CAPS Take 1 capsule by mouth daily.     Marland Kitchen apixaban (ELIQUIS) 5 MG TABS tablet Take 1 tablet (5 mg total) by mouth every 12 (twelve) hours. 60 tablet 0  .  diltiazem (CARDIZEM CD) 300 MG 24 hr capsule Take 1 capsule (300 mg total) by mouth daily. 30 capsule 0  . metoprolol tartrate (LOPRESSOR) 50 MG tablet Take 1 tablet (50 mg total) by mouth 2 (two) times daily. 60 tablet 0  . loperamide (LOPERAMIDE A-D) 2 MG tablet Take 2 pills then one pill after any additional loose stools (Patient not taking: Reported on 01/15/2019) 30 tablet 0   No facility-administered medications prior to visit.       Review of Systems  Constitutional: Positive for fatigue. Negative for fever.  HENT: Positive for postnasal drip and rhinorrhea. Negative for ear pain, sinus pressure, sinus pain, sneezing and sore throat.   Eyes: Negative for visual disturbance.  Respiratory: Positive for cough, sputum production, shortness of breath and wheezing. Negative for hemoptysis and chest tightness.   Cardiovascular: Positive for orthopnea and PND. Negative for chest pain, palpitations, claudication, leg swelling and syncope.  Gastrointestinal: Negative for diarrhea, nausea and vomiting.  Endocrine: Negative for cold intolerance and heat intolerance.  Genitourinary: Negative.   Musculoskeletal: Negative.  Negative for neck pain.  Skin: Negative for rash.  Neurological: Negative for headaches.  Hematological: Does not bruise/bleed easily.  Psychiatric/Behavioral: Negative.        Objective:   Physical Exam Vitals:   01/15/19 0837  BP: 121/83  Pulse: 82  Resp: 20  Temp: 97.8 F (36.6 C)  TempSrc: Oral  SpO2: 100%  Weight: 177 lb (80.3 kg)   Wt Readings from Last 3 Encounters:    01/15/19 177 lb (80.3 kg)  12/07/18 176 lb 14.4 oz (80.2 kg)  12/03/18 181 lb (82.1 kg)   Gen: Pleasant, well-nourished, in no distress,  normal affect  ENT: No lesions,  mouth clear,  oropharynx clear, no postnasal drip  Neck: No JVD, no TMG, no carotid bruits  Lungs: No use of accessory muscles, no dullness to percussion, clear without rales or rhonchi  Cardiovascular: irreg irreg rhythm, heart sounds normal, Systolic  murmur noted  no gallops, no peripheral edema  Abdomen: soft and NT, no HSM,  BS normal  Musculoskeletal: No deformities, no cyanosis or clubbing  Neuro: alert, non focal  Skin: Warm, no lesions or rashes  BMP Latest Ref Rng & Units 12/07/2018 12/06/2018 12/05/2018  Glucose 70 - 99 mg/dL 106(H) 126(H) 113(H)  BUN 8 - 23 mg/dL 32(H) 36(H) 37(H)  Creatinine 0.44 - 1.00 mg/dL 1.56(H) 1.63(H) 1.60(H)  BUN/Creat Ratio 12 - 28 - - -  Sodium 135 - 145 mmol/L 136 137 137  Potassium 3.5 - 5.1 mmol/L 4.0 4.3 4.4  Chloride 98 - 111 mmol/L 108 108 108  CO2 22 - 32 mmol/L 20(L) 19(L) 17(L)  Calcium 8.9 - 10.3 mg/dL 9.0 9.3 8.9   CBC Latest Ref Rng & Units 12/07/2018 12/06/2018 12/05/2018  WBC 4.0 - 10.5 K/uL 8.7 11.3(H) 10.9(H)  Hemoglobin 12.0 - 15.0 g/dL 12.6 13.2 12.9  Hematocrit 36.0 - 46.0 % 39.4 41.1 38.9  Platelets 150 - 400 K/uL 274 299 281   Jan 2020 TTE/TEE: TTE showing EF 40-45%, mild to moderate AS, moderate regurg. TEE was performed on 1/3 with plans to DCCV but was aborted when imaging showed large thrombus in LAA; mobile densities in RA likely thrombus and possible thrombus in pericardial space.  ECG 2/11:   Atrial Fib with PVC prolonged QT no ST T wave changes    Assessment & Plan:  I personally reviewed all images and lab data in the [facility] system as well as  any outside material available during this office visit and agree with the  radiology impressions.   Essential hypertension Blood pressure currently is well controlled on Cardizem and metoprolol  We will  not restart losartan or HCTZ at this time pending a basic metabolic panel recheck  Aortic stenosis History of heart murmur in the past now with documented aortic stenosis and aortic regurgitation  Ejection fraction 40 to 45%  Left atrial right atrial thrombus seen  We will continue anticoagulation and rate control with cardiology follow-up pending  A-fib [facility]) Atrial fibrillation with previous rapid ventricular response now rate is controlled  We will continue anticoagulation metoprolol and diltiazem  We will check a CBC and follow-up with cardiology as scheduled for February 14 as the patient needs to be cardioverted  Atrial fibrillation with RVR ([place]) The patient is no longer in rapid ventricular response  Hypertriglyceridemia History of hypertriglyceridemia we will follow-up with repeat lipid study  Ejection fraction < 50% Low ejection fraction 40 to 45% on recent echocardiogram secondary to atrial fibrillation and aortic stenosis  Follow-up per cardiology  Atrial thrombus without antecedent myocardial infarction There is left and right atrial thrombus without previous myocardial infarction  Continue anticoagulation with Eliquis   Diagnoses and all orders for this visit:  Persistent atrial fibrillation -     Basic metabolic panel; Future -     CBC with Differential/Platelet; Future -     Ambulatory referral to Cardiology -     CBC with Differential/Platelet -     Basic metabolic panel  Essential hypertension -     Basic metabolic panel; Future -     CBC with Differential/Platelet; Future -     CBC with Differential/Platelet -     Basic metabolic panel  Hypertriglyceridemia -     Lipid Panel  Aortic valve stenosis, etiology of cardiac valve disease unspecified -     Ambulatory referral to Cardiology  Atrial fibrillation with RVR ([facility])  Ejection fraction < 50%  Atrial thrombus without antecedent myocardial infarction  Other orders -     apixaban  (ELIQUIS) 5 MG TABS tablet; Take 1 tablet (5 mg total) by mouth every 12 (twelve) hours. -     diltiazem (CARDIZEM CD) 300 MG 24 hr capsule; Take 1 capsule (300 mg total) by mouth daily. -     metoprolol tartrate (LOPRESSOR) 50 MG tablet; Take 1 tablet (50 mg total) by mouth 2 (two) times daily. -     Pneumococcal polysaccharide vaccine 23-valent greater than or equal to 2yo subcutaneous/IM  Note a Pneumovax was given this visit  The patient needs a colonoscopy and we asked the patient to delay this till her cardiac status stabilizes  Follow-up basic metabolic panel CBC and lipid panel today

## 2019-01-15 NOTE — Progress Notes (Signed)
Discuss respiratory issues.  States it's getting better but still struggles

## 2019-01-15 NOTE — Assessment & Plan Note (Signed)
There is left and right atrial thrombus without previous myocardial infarction  Continue anticoagulation with Eliquis

## 2019-01-15 NOTE — Assessment & Plan Note (Signed)
History of hypertriglyceridemia we will follow-up with repeat lipid study

## 2019-01-15 NOTE — Assessment & Plan Note (Signed)
Atrial fibrillation with previous rapid ventricular response now rate is controlled  We will continue anticoagulation metoprolol and diltiazem  We will check a CBC and follow-up with cardiology as scheduled for February 14 as the patient needs to be cardioverted

## 2019-01-16 LAB — BASIC METABOLIC PANEL
BUN/Creatinine Ratio: 10 — ABNORMAL LOW (ref 12–28)
BUN: 12 mg/dL (ref 8–27)
CO2: 21 mmol/L (ref 20–29)
Calcium: 9.7 mg/dL (ref 8.7–10.3)
Chloride: 103 mmol/L (ref 96–106)
Creatinine, Ser: 1.26 mg/dL — ABNORMAL HIGH (ref 0.57–1.00)
GFR calc Af Amer: 52 mL/min/{1.73_m2} — ABNORMAL LOW (ref >59)
GFR calc non Af Amer: 45 mL/min/{1.73_m2} — ABNORMAL LOW (ref >59)
Glucose: 101 mg/dL — ABNORMAL HIGH (ref 65–99)
Potassium: 5.3 mmol/L — ABNORMAL HIGH (ref 3.5–5.2)
Sodium: 141 mmol/L (ref 134–144)

## 2019-01-16 LAB — CBC WITH DIFFERENTIAL/PLATELET
Basophils Absolute: 0.1 10*3/uL (ref 0.0–0.2)
Basos: 1 %
EOS (ABSOLUTE): 0.1 10*3/uL (ref 0.0–0.4)
Eos: 1 %
Hematocrit: 47 % — ABNORMAL HIGH (ref 34.0–46.6)
Hemoglobin: 15.5 g/dL (ref 11.1–15.9)
IMMATURE GRANULOCYTES: 0 %
Immature Grans (Abs): 0 10*3/uL (ref 0.0–0.1)
Lymphocytes Absolute: 2.7 10*3/uL (ref 0.7–3.1)
Lymphs: 35 %
MCH: 30 pg (ref 26.6–33.0)
MCHC: 33 g/dL (ref 31.5–35.7)
MCV: 91 fL (ref 79–97)
Monocytes Absolute: 0.7 10*3/uL (ref 0.1–0.9)
Monocytes: 9 %
Neutrophils Absolute: 4.1 10*3/uL (ref 1.4–7.0)
Neutrophils: 54 %
Platelets: 336 10*3/uL (ref 150–450)
RBC: 5.17 x10E6/uL (ref 3.77–5.28)
RDW: 12.7 % (ref 11.7–15.4)
WBC: 7.6 10*3/uL (ref 3.4–10.8)

## 2019-01-16 LAB — LIPID PANEL
Chol/HDL Ratio: 3.5 ratio (ref 0.0–4.4)
Cholesterol, Total: 139 mg/dL (ref 100–199)
HDL: 40 mg/dL (ref >39)
LDL Calculated: 70 mg/dL (ref 0–99)
Triglycerides: 147 mg/dL (ref 0–149)
VLDL Cholesterol Cal: 29 mg/dL (ref 5–40)

## 2019-01-17 ENCOUNTER — Encounter: Payer: Self-pay | Admitting: Physician Assistant

## 2019-01-17 ENCOUNTER — Ambulatory Visit: Payer: Medicare Other | Admitting: Physician Assistant

## 2019-01-17 VITALS — BP 140/100 | HR 62 | Ht 66.0 in | Wt 173.8 lb

## 2019-01-17 DIAGNOSIS — I10 Essential (primary) hypertension: Secondary | ICD-10-CM

## 2019-01-17 DIAGNOSIS — I428 Other cardiomyopathies: Secondary | ICD-10-CM | POA: Insufficient documentation

## 2019-01-17 DIAGNOSIS — I350 Nonrheumatic aortic (valve) stenosis: Secondary | ICD-10-CM | POA: Diagnosis not present

## 2019-01-17 DIAGNOSIS — I513 Intracardiac thrombosis, not elsewhere classified: Secondary | ICD-10-CM

## 2019-01-17 DIAGNOSIS — I420 Dilated cardiomyopathy: Secondary | ICD-10-CM

## 2019-01-17 DIAGNOSIS — I4819 Other persistent atrial fibrillation: Secondary | ICD-10-CM | POA: Diagnosis not present

## 2019-01-17 LAB — CBC
Hematocrit: 45.4 % (ref 34.0–46.6)
Hemoglobin: 15.1 g/dL (ref 11.1–15.9)
MCH: 29.8 pg (ref 26.6–33.0)
MCHC: 33.3 g/dL (ref 31.5–35.7)
MCV: 90 fL (ref 79–97)
PLATELETS: 326 10*3/uL (ref 150–450)
RBC: 5.06 x10E6/uL (ref 3.77–5.28)
RDW: 13 % (ref 11.7–15.4)
WBC: 9.3 10*3/uL (ref 3.4–10.8)

## 2019-01-17 LAB — BASIC METABOLIC PANEL
BUN / CREAT RATIO: 9 — AB (ref 12–28)
BUN: 12 mg/dL (ref 8–27)
CHLORIDE: 102 mmol/L (ref 96–106)
CO2: 22 mmol/L (ref 20–29)
CREATININE: 1.3 mg/dL — AB (ref 0.57–1.00)
Calcium: 9.8 mg/dL (ref 8.7–10.3)
GFR calc Af Amer: 50 mL/min/{1.73_m2} — ABNORMAL LOW (ref >59)
GFR calc non Af Amer: 43 mL/min/{1.73_m2} — ABNORMAL LOW (ref >59)
Glucose: 109 mg/dL — ABNORMAL HIGH (ref 65–99)
Potassium: 4.7 mmol/L (ref 3.5–5.2)
Sodium: 140 mmol/L (ref 134–144)

## 2019-01-17 MED ORDER — METOPROLOL TARTRATE 50 MG PO TABS: 75.0000 mg | ORAL_TABLET | Freq: Two times a day (BID) | ORAL | 3 refills | Status: DC

## 2019-01-17 NOTE — H&P (View-Only) (Signed)
Cardiology Office Note:    Date:  01/17/2019   ID:  Oniyah, Rohe 1951/03/14, MRN [id number]  PCP:  Antony Blackbird, MD  Cardiologist:  Sherren Mocha, MD   Electrophysiologist:  None   Referring MD: Elsie Stain, MD   Chief Complaint  Patient presents with  . Hospitalization Follow-up    a fib     History of Present Illness:    TRAM WRENN is a 68 y.o. female with aortic stenosis, hypertension, tobacco use.  Coronary CTA in August 2018 demonstrated a calcium score of 0 and no evidence of CAD.  She was last seen in clinic in September 2018.    She was admitted 12/31-1/4 with atrial fibrillation with rapid ventricular rate.  Echocardiogram demonstrated EF 40-45% with moderate aortic stenosis and moderate mitral regurgitation.  She underwent attempted TEE cardioversion.  However, TEE demonstrated thrombus in the right atrium and left atrium and possible clot adjacent to the heart.  Therefore cardioversion was not performed.  She was also not placed on amiodarone.  She was rate controlled with diltiazem and metoprolol.  TEE did demonstrate an EF of 30-35.  It was felt that her cardiomyopathy was likely tachycardia mediated.  Cardiology recommended repeat TEE after 1 month of uninterrupted anticoagulation.  She did have acute kidney injury with peak creatinine of 1.63.  This improved prior to discharge.  Her ARB and HCTZ were both held.  She was seen earlier this week by Dr[facility]ins [facility].  Her heart rate was much better controlled.  Follow-up labs demonstrated a creatinine of 1.26.  Ms. Burling returns for follow up.  She is here alone.  She has continued to feel short of breath with exertion since DC.  She occasionally has symptoms of orthopnea.  She denies paroxysmal nocturnal dyspnea.  She denies chest pain, syncope, leg swelling.   Prior CV studies:   The following studies were reviewed today:  Transesophageal echocardiogram 01/02/2019 EF  30-35, diffuse HK, AV cusp separation severely reduced, mild AI, mild MR, moderate LAE, positive LAA clot, severely reduced RV SF, moderate RAE with positive clot in the atrial cavity, small pericardial effusion, poss thrombus in pericardial space  Echo 12/04/2018 Mild LVH, EF 40-45, diffuse HK, mild to moderate aortic stenosis (mean 19, peak 30), moderate MR, mild LAE, moderately reduced RV SF, PASP 28  CXR 12/03/18 IMPRESSION: 1. Diffuse peribronchial cuffing concerning for bronchitis, with probable left lower lobe bronchopneumonia. 2. Trace bilateral pleural effusions. 3. Aortic atherosclerosis.  Carotid US 10/10/2018 Summary: Right Carotid: There is no evidence of stenosis in the right ICA. Left Carotid: There is no evidence of stenosis in the left ICA. Vertebrals:  Bilateral vertebral arteries demonstrate antegrade flow. Subclavians: Normal flow hemodynamics were seen in bilateral subclavian              arteries.  Echo 08/01/17 Mod LVH, EF 60-65, no RWMA, Gr 2 DD, mod AS (mean 35, peak 60), LVOT/AV velocity ratio 0.21, trivial MR, mild LAE  Coronary CTA 07/25/17 IMPRESSION: 1. Coronary calcium score of 0. This was 0 percentile for age and sex matched control. 2. Normal coronary origin with right dominance. 3. No evidence of CAD. 4. Unusually thickened leaflets of the tricuspid aortic valve mild calcifications in the non-coronary cusp. Leaflets opening can't be evaluated as only diastolic images were obtained. An echocardiogram is recommended for further evaluation.  Chest CTA 07/19/17 IMPRESSION: 1. No thoracic aortic aneurysm or dissection. There are  foci of calcification in the thoracic aorta and great vessels. There is mild calcification in the aortic valve. No evident pulmonary embolus. 2. There is a degree of centrilobular emphysematous change. No edema or consolidation. There is bibasilar atelectasis. 3. Single mildly prominent pretracheal lymph node, likely reactive in  etiology. Aortic Atherosclerosis (ICD10-I70.0) and Emphysema (ICD10-J43.9).  Carotid US 12/14 There is no evidence of a hemodynamically significant carotid stenosis.   Past Medical History:  Diagnosis Date  . Allergy    SEASONAL  . Heart murmur   . Hypertension    Surgical Hx: The patient  has a past surgical history that includes Tonsillectomy and TEE without cardioversion (N/A, 12/06/2018).   Current Medications: Current Meds  Medication Sig  . apixaban (ELIQUIS) 5 MG TABS tablet Take 1 tablet (5 mg total) by mouth every 12 (twelve) hours.  . Calcium 600-200 MG-UNIT tablet Take 1 tablet by mouth daily.  Marland Kitchen diltiazem (CARDIZEM CD) 300 MG 24 hr capsule Take 1 capsule (300 mg total) by mouth daily.  . metoprolol tartrate (LOPRESSOR) 50 MG tablet Take 1.5 tablets (75 mg total) by mouth 2 (two) times daily.  . nitroGLYCERIN (NITROSTAT) 0.4 MG SL tablet Place 1 tablet (0.4 mg total) under the tongue every 5 (five) minutes as needed for chest pain.  . Omega-3 1000 MG CAPS Take 1,000 mg by mouth daily.   . [DISCONTINUED] metoprolol tartrate (LOPRESSOR) 50 MG tablet Take 1 tablet (50 mg total) by mouth 2 (two) times daily.     Allergies:   Patient has no known allergies.   Social History   Tobacco Use  . Smoking status: Former Smoker    Last attempt to quit: 12/05/2011    Years since quitting: 7.1  . Smokeless tobacco: Never Used  Substance Use Topics  . Alcohol use: Yes    Comment: occasionally  . Drug use: No     Family Hx: The patient's family history includes Arthritis in her maternal grandmother and mother; Asthma in her brother; Cancer in her brother and mother; Diabetes in her brother and mother; Early death in her brother; Heart disease in her father, maternal grandmother, and paternal grandmother; Heart murmur in her brother, brother, and mother; Hypertension in her brother and mother.  ROS:   Please see the history of present illness.    Review of Systems  Respiratory:  Positive for cough, shortness of breath and wheezing.    All other systems reviewed and are negative.   EKGs/Labs/Other Test Reviewed:    EKG:  EKG is not ordered today.  The ekg done 01/14/19 demonstrates atrial fibrillation, HR 86  Recent Labs: 10/04/2018: ALT 11 12/03/2018: Magnesium 1.8; TSH 0.991 01/15/2019: BUN 12; Creatinine, Ser 1.26; Hemoglobin 15.5; Platelets 336; Potassium 5.3; Sodium 141   Recent Lipid Panel Lab Results  Component Value Date/Time   CHOL 139 01/15/2019 10:49 AM   TRIG 147 01/15/2019 10:49 AM   HDL 40 01/15/2019 10:49 AM   CHOLHDL 3.5 01/15/2019 10:49 AM   CHOLHDL 3.2 09/26/2017 09:10 AM   LDLCALC 70 01/15/2019 10:49 AM   LDLCALC 95 09/26/2017 09:10 AM    Physical Exam:    VS:  BP (!) 140/100   Pulse 62   Ht 5\' 6"  (1.676 m)   Wt 173 lb 12.8 oz (78.8 kg)   SpO2 99%   BMI 28.05 kg/m     Wt Readings from Last 3 Encounters:  01/17/19 173 lb 12.8 oz (78.8 kg)  01/15/19 177 lb (80.3 kg)  12/07/18 176 lb 14.4 oz (80.2 kg)     Physical Exam  Constitutional: She is oriented to person, place, and time. She appears well-developed and well-nourished.  HENT:  Head: Normocephalic and atraumatic.  Eyes: No scleral icterus.  Neck: No JVD present. No thyromegaly present.  Cardiovascular: Normal rate. An irregularly irregular rhythm present.  No murmur heard. Pulmonary/Chest: Effort normal. She has no wheezes. She has no rales.  Abdominal: Soft.  Musculoskeletal:        General: No edema.  Lymphadenopathy:    She has no cervical adenopathy.  Neurological: She is alert and oriented to person, place, and time.  Skin: Skin is warm and dry.  Psychiatric: She has a normal mood and affect.    ASSESSMENT & PLAN:    Persistent atrial fibrillation She remains in atrial fibrillation.  Her ECG confirmed this several days ago and her rhythm is irregular on exam today.  She notes episodes of orthopnea and she still feels short of breath with exertion.  I  suspect her HR is elevated at times still.  She had thrombus in the LA and RA on TEE in the hospital and questionable clot adjacent to the heart.  She has been on Apixaban without interruption since DC from the hospital.  She will require repeat TEE to ensure her thrombus has resolved.  If resolved she may undergo DCCV.  We discussed the risks and benefits or proceeding with TEE guided DCCV.  She is willing to proceed.  I discussed her case today with Dr. Lovena Le (attending MD).  -Labs done earlier this week with PCP  -Arrange TEE-DCCV next week  -Continue beta-blocker, calcium channel blocker, Apixaban  DCM (dilated cardiomyopathy) (HCC) No evidence of volume excess on exam.  This is likely tachycardia mediated.  She will need repeat echo once she is maintaining NSR.  Aortic valve stenosis, etiology of cardiac valve disease unspecified Mod by recent echo.  Essential hypertension BP elevated. I will increase her Metoprolol to 75 mg bid.  Consider adding ACE inhibitor if BP remains above target and renal function remains stable.   Dispo:  Return in about 3 weeks (around 02/07/2019) for Post Procedure Follow Up, w/ Dr. Burt Knack, or Richardson Dopp, PA-C.   Medication Adjustments/Labs and Tests Ordered: Current medicines are reviewed at length with the patient today.  Concerns regarding medicines are outlined above.  Tests Ordered: Orders Placed This Encounter  Procedures  . Basic metabolic panel  . CBC   Medication Changes: Meds ordered this encounter  Medications  . metoprolol tartrate (LOPRESSOR) 50 MG tablet    Sig: Take 1.5 tablets (75 mg total) by mouth 2 (two) times daily.    Dispense:  270 tablet    Refill:  3    Signed, Richardson Dopp, PA-C  01/17/2019 2:16 PM    [facility] [address], [place], [place]  [address] Phone: [phone]; Fax: [phone]   EP Attending  Agree with the findings as noted above. We will plan to admit for DCCV with TEE  guidance next week.  Mikle Bosworth.D.

## 2019-01-17 NOTE — Patient Instructions (Signed)
Medication Instructions:  INCREASE: Metoprolol to 75 mg twice a day ( take 1.5 tablet by mouth twice a day)  If you need a refill on your cardiac medications before your next appointment, please call your pharmacy.   Lab work: TODAY: BMET & CBC  If you have labs (blood work) drawn today and your tests are completely normal, you will receive your results only by: Marland Kitchen MyChart Message (if you have MyChart) OR . A paper copy in the mail If you have any lab test that is abnormal or we need to change your treatment, we will call you to review the results.  Testing/Procedures: Your physician has requested that you have a TEE/Cardioversion. During a TEE, sound waves are used to create images of your heart. It provides your doctor with information about the size and shape of your heart and how well your heart's chambers and valves are working. In this test, a transducer is attached to the end of a flexible tube that is guided down you throat and into your esophagus (the tube leading from your mouth to your stomach) to get a more detailed image of your heart. Once the TEE has determined that a blood clot is not present, the cardioversion begins. Electrical Cardioversion uses a jolt of electricity to your heart either through paddles or wired patches attached to your chest. This is a controlled, usually prescheduled, procedure. This procedure is done at the hospital and you are not awake during the procedure. You usually go home the day of the procedure. Please see the instruction sheet given to you today for more information.   Follow-Up: At [facility], you and your health needs are our priority.  As part of our continuing mission to provide you with exceptional heart care, we have created designated Provider Care Teams.  These Care Teams include your primary Cardiologist (physician) and Advanced Practice Providers (APPs -  Physician Assistants and Nurse Practitioners) who all work together to provide you  with the care you need, when you need it. You will need a follow up appointment in:  2 weeks.   You may see Sherren Mocha, MD or one of the following Advanced Practice Providers on your designated Care Team: Richardson Dopp, PA-C  Any Other Special Instructions Will Be Listed Below (If Applicable).

## 2019-01-17 NOTE — Progress Notes (Addendum)
Cardiology Office Note:    Date:  01/17/2019   ID:  Tracy, James 1951-06-12, MRN [id number]  PCP:  Antony Blackbird, MD  Cardiologist:  Sherren Mocha, MD   Electrophysiologist:  None   Referring MD: Elsie Stain, MD   Chief Complaint  Patient presents with  . Hospitalization Follow-up    a fib     History of Present Illness:    Tracy James is a 68 y.o. female with aortic stenosis, hypertension, tobacco use.  Coronary CTA in August 2018 demonstrated a calcium score of 0 and no evidence of CAD.  She was last seen in clinic in September 2018.    She was admitted 12/31-1/4 with atrial fibrillation with rapid ventricular rate.  Echocardiogram demonstrated EF 40-45% with moderate aortic stenosis and moderate mitral regurgitation.  She underwent attempted TEE cardioversion.  However, TEE demonstrated thrombus in the right atrium and left atrium and possible clot adjacent to the heart.  Therefore cardioversion was not performed.  She was also not placed on amiodarone.  She was rate controlled with diltiazem and metoprolol.  TEE did demonstrate an EF of 30-35.  It was felt that her cardiomyopathy was likely tachycardia mediated.  Cardiology recommended repeat TEE after 1 month of uninterrupted anticoagulation.  She did have acute kidney injury with peak creatinine of 1.63.  This improved prior to discharge.  Her ARB and HCTZ were both held.  She was seen earlier this week by Dr. [facility]a [facility]ins [facility].  Her heart rate was much better controlled.  Follow-up labs demonstrated a creatinine of 1.26.  Tracy James returns for follow up.  She is here alone.  She has continued to feel short of breath with exertion since DC.  She occasionally has symptoms of orthopnea.  She denies paroxysmal nocturnal dyspnea.  She denies chest pain, syncope, leg swelling.   Prior CV studies:   The following studies were reviewed today:  Transesophageal echocardiogram 01/02/2019 EF  30-35, diffuse HK, AV cusp separation severely reduced, mild AI, mild MR, moderate LAE, positive LAA clot, severely reduced RV SF, moderate RAE with positive clot in the atrial cavity, small pericardial effusion, poss thrombus in pericardial space  Echo 12/04/2018 Mild LVH, EF 40-45, diffuse HK, mild to moderate aortic stenosis (mean 19, peak 30), moderate MR, mild LAE, moderately reduced RV SF, PASP 28  CXR 12/03/18 IMPRESSION: 1. Diffuse peribronchial cuffing concerning for bronchitis, with probable left lower lobe bronchopneumonia. 2. Trace bilateral pleural effusions. 3. Aortic atherosclerosis.  Carotid US 10/10/2018 Summary: Right Carotid: There is no evidence of stenosis in the right ICA. Left Carotid: There is no evidence of stenosis in the left ICA. Vertebrals:  Bilateral vertebral arteries demonstrate antegrade flow. Subclavians: Normal flow hemodynamics were seen in bilateral subclavian              arteries.  Echo 08/01/17 Mod LVH, EF 60-65, no RWMA, Gr 2 DD, mod AS (mean 35, peak 60), LVOT/AV velocity ratio 0.21, trivial MR, mild LAE  Coronary CTA 07/25/17 IMPRESSION: 1. Coronary calcium score of 0. This was 0 percentile for age and sex matched control. 2. Normal coronary origin with right dominance. 3. No evidence of CAD. 4. Unusually thickened leaflets of the tricuspid aortic valve mild calcifications in the non-coronary cusp. Leaflets opening can't be evaluated as only diastolic images were obtained. An echocardiogram is recommended for further evaluation.  Chest CTA 07/19/17 IMPRESSION: 1. No thoracic aortic aneurysm or dissection. There are  foci of calcification in the thoracic aorta and great vessels. There is mild calcification in the aortic valve. No evident pulmonary embolus. 2. There is a degree of centrilobular emphysematous change. No edema or consolidation. There is bibasilar atelectasis. 3. Single mildly prominent pretracheal lymph node, likely reactive in  etiology. Aortic Atherosclerosis (ICD10-I70.0) and Emphysema (ICD10-J43.9).  Carotid US 12/14 There is no evidence of a hemodynamically significant carotid stenosis.   Past Medical History:  Diagnosis Date  . Allergy    SEASONAL  . Heart murmur   . Hypertension    Surgical Hx: The patient  has a past surgical history that includes Tonsillectomy and TEE without cardioversion (N/A, 12/06/2018).   Current Medications: Current Meds  Medication Sig  . apixaban (ELIQUIS) 5 MG TABS tablet Take 1 tablet (5 mg total) by mouth every 12 (twelve) hours.  . Calcium 600-200 MG-UNIT tablet Take 1 tablet by mouth daily.  Marland Kitchen diltiazem (CARDIZEM CD) 300 MG 24 hr capsule Take 1 capsule (300 mg total) by mouth daily.  . metoprolol tartrate (LOPRESSOR) 50 MG tablet Take 1.5 tablets (75 mg total) by mouth 2 (two) times daily.  . nitroGLYCERIN (NITROSTAT) 0.4 MG SL tablet Place 1 tablet (0.4 mg total) under the tongue every 5 (five) minutes as needed for chest pain.  . Omega-3 1000 MG CAPS Take 1,000 mg by mouth daily.   . [DISCONTINUED] metoprolol tartrate (LOPRESSOR) 50 MG tablet Take 1 tablet (50 mg total) by mouth 2 (two) times daily.     Allergies:   Patient has no known allergies.   Social History   Tobacco Use  . Smoking status: Former Smoker    Last attempt to quit: 12/05/2011    Years since quitting: 7.1  . Smokeless tobacco: Never Used  Substance Use Topics  . Alcohol use: Yes    Comment: occasionally  . Drug use: No     Family Hx: The patient's family history includes Arthritis in her maternal grandmother and mother; Asthma in her brother; Cancer in her brother and mother; Diabetes in her brother and mother; Early death in her brother; Heart disease in her father, maternal grandmother, and paternal grandmother; Heart murmur in her brother, brother, and mother; Hypertension in her brother and mother.  ROS:   Please see the history of present illness.    Review of Systems  Respiratory:  Positive for cough, shortness of breath and wheezing.    All other systems reviewed and are negative.   EKGs/Labs/Other Test Reviewed:    EKG:  EKG is not ordered today.  The ekg done 01/14/19 demonstrates atrial fibrillation, HR 86  Recent Labs: 10/04/2018: ALT 11 12/03/2018: Magnesium 1.8; TSH 0.991 01/15/2019: BUN 12; Creatinine, Ser 1.26; Hemoglobin 15.5; Platelets 336; Potassium 5.3; Sodium 141   Recent Lipid Panel Lab Results  Component Value Date/Time   CHOL 139 01/15/2019 10:49 AM   TRIG 147 01/15/2019 10:49 AM   HDL 40 01/15/2019 10:49 AM   CHOLHDL 3.5 01/15/2019 10:49 AM   CHOLHDL 3.2 09/26/2017 09:10 AM   LDLCALC 70 01/15/2019 10:49 AM   LDLCALC 95 09/26/2017 09:10 AM    Physical Exam:    VS:  BP (!) 140/100   Pulse 62   Ht 5\' 6"  (1.676 m)   Wt 173 lb 12.8 oz (78.8 kg)   SpO2 99%   BMI 28.05 kg/m     Wt Readings from Last 3 Encounters:  01/17/19 173 lb 12.8 oz (78.8 kg)  01/15/19 177 lb (80.3 kg)  12/07/18 176 lb 14.4 oz (80.2 kg)     Physical Exam  Constitutional: She is oriented to person, place, and time. She appears well-developed and well-nourished.  HENT:  Head: Normocephalic and atraumatic.  Eyes: No scleral icterus.  Neck: No JVD present. No thyromegaly present.  Cardiovascular: Normal rate. An irregularly irregular rhythm present.  No murmur heard. Pulmonary/Chest: Effort normal. She has no wheezes. She has no rales.  Abdominal: Soft.  Musculoskeletal:        General: No edema.  Lymphadenopathy:    She has no cervical adenopathy.  Neurological: She is alert and oriented to person, place, and time.  Skin: Skin is warm and dry.  Psychiatric: She has a normal mood and affect.    ASSESSMENT & PLAN:    Persistent atrial fibrillation She remains in atrial fibrillation.  Her ECG confirmed this several days ago and her rhythm is irregular on exam today.  She notes episodes of orthopnea and she still feels short of breath with exertion.  I  suspect her HR is elevated at times still.  She had thrombus in the LA and RA on TEE in the hospital and questionable clot adjacent to the heart.  She has been on Apixaban without interruption since DC from the hospital.  She will require repeat TEE to ensure her thrombus has resolved.  If resolved she may undergo DCCV.  We discussed the risks and benefits or proceeding with TEE guided DCCV.  She is willing to proceed.  I discussed her case today with Dr. Lovena Le (attending MD).  -Labs done earlier this week with PCP  -Arrange TEE-DCCV next week  -Continue beta-blocker, calcium channel blocker, Apixaban  DCM (dilated cardiomyopathy) (HCC) No evidence of volume excess on exam.  This is likely tachycardia mediated.  She will need repeat echo once she is maintaining NSR.  Aortic valve stenosis, etiology of cardiac valve disease unspecified Mod by recent echo.  Essential hypertension BP elevated. I will increase her Metoprolol to 75 mg bid.  Consider adding ACE inhibitor if BP remains above target and renal function remains stable.   Dispo:  Return in about 3 weeks (around 02/07/2019) for Post Procedure Follow Up, w/ Dr. Burt Knack, or Richardson Dopp, PA-C.   Medication Adjustments/Labs and Tests Ordered: Current medicines are reviewed at length with the patient today.  Concerns regarding medicines are outlined above.  Tests Ordered: Orders Placed This Encounter  Procedures  . Basic metabolic panel  . CBC   Medication Changes: Meds ordered this encounter  Medications  . metoprolol tartrate (LOPRESSOR) 50 MG tablet    Sig: Take 1.5 tablets (75 mg total) by mouth 2 (two) times daily.    Dispense:  270 tablet    Refill:  3    Signed, Richardson Dopp, PA-C  01/17/2019 2:16 PM    [facility] [address], [place], [place]  [address] Phone: [phone]; Fax: [phone]   EP Attending  Agree with the findings as noted above. We will plan to admit for DCCV with TEE  guidance next week.  Mikle Bosworth.D.

## 2019-01-20 ENCOUNTER — Ambulatory Visit (HOSPITAL_COMMUNITY): Payer: Medicare Other | Admitting: Registered Nurse

## 2019-01-20 ENCOUNTER — Encounter (HOSPITAL_COMMUNITY)
Admission: RE | Disposition: A | Discharge status: Home / Self Care | Payer: Self-pay | Source: Home / Self Care | Attending: Internal Medicine

## 2019-01-20 ENCOUNTER — Ambulatory Visit (HOSPITAL_COMMUNITY)
Admission: RE | Admit: 2019-01-20 | Discharge: 2019-01-20 | Disposition: A | Discharge status: Home / Self Care | Payer: Medicare Other | Attending: Internal Medicine | Admitting: Internal Medicine

## 2019-01-20 ENCOUNTER — Ambulatory Visit (HOSPITAL_BASED_OUTPATIENT_CLINIC_OR_DEPARTMENT_OTHER)
Admission: RE | Admit: 2019-01-20 | Discharge: 2019-01-20 | Disposition: A | Discharge status: Home / Self Care | Payer: Medicare Other | Source: Home / Self Care | Attending: Physician Assistant | Admitting: Physician Assistant

## 2019-01-20 ENCOUNTER — Other Ambulatory Visit: Payer: Self-pay

## 2019-01-20 ENCOUNTER — Encounter (HOSPITAL_COMMUNITY): Payer: Self-pay | Admitting: *Deleted

## 2019-01-20 DIAGNOSIS — I4819 Other persistent atrial fibrillation: Secondary | ICD-10-CM | POA: Diagnosis present

## 2019-01-20 DIAGNOSIS — I4891 Unspecified atrial fibrillation: Secondary | ICD-10-CM | POA: Diagnosis not present

## 2019-01-20 DIAGNOSIS — Z79899 Other long term (current) drug therapy: Secondary | ICD-10-CM | POA: Diagnosis not present

## 2019-01-20 DIAGNOSIS — I10 Essential (primary) hypertension: Secondary | ICD-10-CM | POA: Diagnosis not present

## 2019-01-20 DIAGNOSIS — Z87891 Personal history of nicotine dependence: Secondary | ICD-10-CM | POA: Insufficient documentation

## 2019-01-20 DIAGNOSIS — I700 Atherosclerosis of aorta: Secondary | ICD-10-CM | POA: Insufficient documentation

## 2019-01-20 DIAGNOSIS — J439 Emphysema, unspecified: Secondary | ICD-10-CM | POA: Diagnosis not present

## 2019-01-20 DIAGNOSIS — I513 Intracardiac thrombosis, not elsewhere classified: Secondary | ICD-10-CM

## 2019-01-20 DIAGNOSIS — Z7901 Long term (current) use of anticoagulants: Secondary | ICD-10-CM | POA: Insufficient documentation

## 2019-01-20 DIAGNOSIS — I420 Dilated cardiomyopathy: Secondary | ICD-10-CM | POA: Insufficient documentation

## 2019-01-20 DIAGNOSIS — I350 Nonrheumatic aortic (valve) stenosis: Secondary | ICD-10-CM | POA: Diagnosis not present

## 2019-01-20 HISTORY — PX: TEE WITHOUT CARDIOVERSION: SHX5443

## 2019-01-20 SURGERY — ECHOCARDIOGRAM, TRANSESOPHAGEAL
Anesthesia: Monitor Anesthesia Care

## 2019-01-20 MED ORDER — SODIUM CHLORIDE 0.9% FLUSH: 3.0000 mL | Freq: Two times a day (BID) | INTRAVENOUS | Status: DC

## 2019-01-20 MED ORDER — LACTATED RINGERS IV SOLN
INTRAVENOUS | Status: DC
  Administered 2019-01-20: 1000 mL via INTRAVENOUS

## 2019-01-20 MED ORDER — BUTAMBEN-TETRACAINE-BENZOCAINE 2-2-14 % EX AERO
INHALATION_SPRAY | CUTANEOUS | Status: DC | PRN
  Administered 2019-01-20: 1 via TOPICAL

## 2019-01-20 MED ORDER — PROPOFOL 500 MG/50ML IV EMUL
INTRAVENOUS | Status: DC | PRN
  Administered 2019-01-20: 75 ug/kg/min via INTRAVENOUS

## 2019-01-20 MED ORDER — LIDOCAINE 2% (20 MG/ML) 5 ML SYRINGE
INTRAMUSCULAR | Status: DC | PRN
  Administered 2019-01-20: 40 mg via INTRAVENOUS

## 2019-01-20 MED ORDER — SODIUM CHLORIDE 0.9 % IV SOLN: 250.0000 mL | INTRAVENOUS | Status: DC

## 2019-01-20 MED ORDER — PROPOFOL 10 MG/ML IV BOLUS
INTRAVENOUS | Status: DC | PRN
  Administered 2019-01-20: 30 mg via INTRAVENOUS
  Administered 2019-01-20: 50 mg via INTRAVENOUS

## 2019-01-20 MED ORDER — HYDROCORTISONE 1 % EX CREA
1.0000 "application " | TOPICAL_CREAM | Freq: Three times a day (TID) | CUTANEOUS | Status: DC | PRN
  Filled 2019-01-20: qty 28

## 2019-01-20 MED ORDER — PERFLUTREN LIPID MICROSPHERE
INTRAVENOUS | Status: AC
  Filled 2019-01-20: qty 10

## 2019-01-20 MED ORDER — SODIUM CHLORIDE 0.9 % IV SOLN: INTRAVENOUS | Status: DC

## 2019-01-20 MED ORDER — SODIUM CHLORIDE 0.9% FLUSH: 3.0000 mL | INTRAVENOUS | Status: DC | PRN

## 2019-01-20 MED ORDER — PERFLUTREN LIPID MICROSPHERE
INTRAVENOUS | Status: DC | PRN
  Administered 2019-01-20: 2 mL via INTRAVENOUS

## 2019-01-20 NOTE — Anesthesia Preprocedure Evaluation (Signed)
Anesthesia Evaluation  Patient identified by MRN, date of birth, ID band Patient awake    Reviewed: Allergy & Precautions, H&P , NPO status , Patient's Chart, lab work & pertinent test results  Airway Mallampati: II   Neck ROM: full    Dental   Pulmonary former smoker,    breath sounds clear to auscultation       Cardiovascular hypertension, + dysrhythmias Atrial Fibrillation  Rhythm:irregular Rate:Normal     Neuro/Psych    GI/Hepatic   Endo/Other    Renal/GU      Musculoskeletal   Abdominal   Peds  Hematology   Anesthesia Other Findings   Reproductive/Obstetrics                             Anesthesia Physical Anesthesia Plan  ASA: II  Anesthesia Plan: MAC   Post-op Pain Management:    Induction: Intravenous  PONV Risk Score and Plan: 2 and Ondansetron, Dexamethasone, Propofol infusion, Midazolam and Treatment may vary due to age or medical condition  Airway Management Planned: Nasal Cannula  Additional Equipment:   Intra-op Plan:   Post-operative Plan:   Informed Consent: I have reviewed the patients History and Physical, chart, labs and discussed the procedure including the risks, benefits and alternatives for the proposed anesthesia with the patient or authorized representative who has indicated his/her understanding and acceptance.       Plan Discussed with: CRNA, Anesthesiologist and Surgeon  Anesthesia Plan Comments:         Anesthesia Quick Evaluation

## 2019-01-20 NOTE — Discharge Instructions (Signed)

## 2019-01-20 NOTE — Interval H&P Note (Signed)
History and Physical Interval Note:  01/20/2019 11:49 AM  Tracy James  has presented today for surgery, with the diagnosis of A-FIB  The various methods of treatment have been discussed with the patient and family. After consideration of risks, benefits and other options for treatment, the patient has consented to  Procedure(s): TRANSESOPHAGEAL ECHOCARDIOGRAM (TEE) (N/A) CARDIOVERSION (N/A) as a surgical intervention .  The patient's history has been reviewed, patient examined, no change in status, stable for surgery.  I have reviewed the patient's chart and labs.  Questions were answered to the patient's satisfaction.     [name]

## 2019-01-20 NOTE — Progress Notes (Signed)
  Echocardiogram Echocardiogram Transesophageal with 3D ,color,  doppler, and Definity has been performed.  Darlina Sicilian M 01/20/2019, 12:33 PM

## 2019-01-20 NOTE — Transfer of Care (Signed)
Immediate Anesthesia Transfer of Care Note  Patient: Tracy James  Procedure(s) Performed: TRANSESOPHAGEAL ECHOCARDIOGRAM (TEE) (N/A ) BUBBLE STUDY  Patient Location: PACU and Endoscopy Unit  Anesthesia Type:MAC  Level of Consciousness: awake, alert  and oriented  Airway & Oxygen Therapy: Patient Spontanous Breathing and Patient connected to nasal cannula oxygen  Post-op Assessment: Report given to RN and Post -op Vital signs reviewed and stable  Post vital signs: Reviewed and stable  Last Vitals:  Vitals Value Taken Time  BP 123/70 01/20/2019 12:21 PM  Temp    Pulse 41 01/20/2019 12:22 PM  Resp 18 01/20/2019 12:22 PM  SpO2 100 % 01/20/2019 12:22 PM  Vitals shown include unvalidated device data.  Last Pain:  Vitals:   01/20/19 1019  TempSrc: Oral  PainSc: 0-No pain         Complications: No apparent anesthesia complications

## 2019-01-20 NOTE — Anesthesia Procedure Notes (Signed)
Procedure Name: MAC Date/Time: 01/20/2019 12:01 PM Performed by: Trinna Post., CRNA Pre-anesthesia Checklist: Patient identified, Emergency Drugs available, Suction available and Patient being monitored Patient Re-evaluated:Patient Re-evaluated prior to induction Oxygen Delivery Method: Nasal cannula Preoxygenation: Pre-oxygenation with 100% oxygen Induction Type: IV induction

## 2019-01-20 NOTE — CV Procedure (Signed)
INDICATIONS: pre-cardioversion tee, previous LA/RA thrombus  PROCEDURE:   Informed consent was obtained prior to the procedure. The risks, benefits and alternatives for the procedure were discussed and the patient comprehended these risks.  Risks include, but are not limited to, cough, sore throat, vomiting, nausea, somnolence, esophageal and stomach trauma or perforation, bleeding, low blood pressure, aspiration, pneumonia, infection, trauma to the teeth and death.    After a procedural time-out, the oropharynx was anesthetized with 20% benzocaine spray.   During this procedure the patient was administered propofol per anesthesia to achieve and maintain moderate sedation.  The patient's heart rate, blood pressure, and oxygen saturation were monitored continuously during the procedure. The period of conscious sedation was 20 minutes, of which I was present face-to-face 100% of this time.  The transesophageal probe was inserted in the esophagus and stomach without difficulty and multiple views were obtained.  The patient was kept under observation until the patient left the procedure room.  The patient left the procedure room in stable condition.   Agitated microbubble saline contrast was not administered.  COMPLICATIONS:    There were no immediate complications.  FINDINGS:  Sludge in LA appendage, concern for possible residual thrombus.  Cardioversion was NOT performed.  RECOMMENDATIONS:    Can be dismissed when alert.  Time Spent Directly with the Patient:  45 minutes   [name] 01/20/2019, 12:28 PM

## 2019-01-21 NOTE — Anesthesia Postprocedure Evaluation (Signed)
Anesthesia Post Note  Patient: Tracy James  Procedure(s) Performed: TRANSESOPHAGEAL ECHOCARDIOGRAM (TEE) (N/A )     Patient location during evaluation: Endoscopy Anesthesia Type: MAC Level of consciousness: awake and alert Pain management: pain level controlled Vital Signs Assessment: post-procedure vital signs reviewed and stable Respiratory status: spontaneous breathing, nonlabored ventilation, respiratory function stable and patient connected to nasal cannula oxygen Cardiovascular status: blood pressure returned to baseline and stable Postop Assessment: no apparent nausea or vomiting Anesthetic complications: no    Last Vitals:  Vitals:   01/20/19 1250 01/20/19 1254  BP: (!) 155/104 (!) 156/97  Pulse: 79 69  Resp: 20 15  Temp:    SpO2: 100% 100%    Last Pain:  Vitals:   01/20/19 1254  TempSrc:   PainSc: 0-No pain                 Chaun Uemura S

## 2019-02-03 NOTE — Progress Notes (Signed)
Cardiology Office Note:    Date:  02/04/2019   ID:  Tracy, James 07-Feb-1951, MRN [id number]  PCP:  Tracy Blackbird, MD  Cardiologist:  Tracy Mocha, MD   Electrophysiologist:  None   Referring MD: Tracy Blackbird, MD   Chief Complaint  Patient presents with  . Atrial Fibrillation    Follow Up     History of Present Illness:    Tracy James is a 68 y.o. female with persistent AFib with associated tachycardia induced cardiomyopathy and systolic HF, aortic stenosis, hypertension, tobacco use.  Coronary CTA in August 2018 demonstrated a calcium score of 0 and no evidence of CAD.  She was admitted in 11/2018 with AF with RVR.  EF was 40-45 on Echo and TEE demonstrated thrombus in the R and L atria and possible thrombus adjacent to the heart.  She was last seen 01/17/2019 and a repeat TEE-DCCV was arranged.  Unfortunately, the TEE continued to show residual clot in the LA.  Therefore, DCCV was not performed.    Tracy James returns for follow up.  She is here alone. She remains short of breath with mild to mod activities.  She notes 2 pillow orthopnea and paroxysmal nocturnal dyspnea.  She has not had leg swelling or abdominal bloating.  She has not had chest pain, syncope.  She has not had any bleeding issues.  She has been adherent to her medications.   Prior CV studies:   The following studies were reviewed today:  TEE 01/20/2019 EF 30-35, atrial level shunting, small fixed LA thrombus on interior of LAA, no RAA clot  Transesophageal echocardiogram 01/02/2019 EF 30-35, diffuse HK, AV cusp separation severely reduced, mild AI, mild MR, moderate LAE, positive LAA clot, severely reduced RV SF, moderate RAE with positive clot in the atrial cavity, small pericardial effusion, poss thrombus in pericardial space  Echo 12/04/2018 Mild LVH, EF 40-45, diffuse HK, mild to moderate aortic stenosis (mean 19, peak 30), moderate MR, mild LAE, moderately reduced RV SF, PASP 28  CXR  12/03/18 IMPRESSION: 1. Diffuse peribronchial cuffing concerning for bronchitis, with probable left lower lobe bronchopneumonia. 2. Trace bilateral pleural effusions. 3. Aortic atherosclerosis.  Carotid US 10/10/2018 Summary: Right Carotid: There is no evidence of stenosis in the right ICA. Left Carotid: There is no evidence of stenosis in the left ICA. Vertebrals: Bilateral vertebral arteries demonstrate antegrade flow. Subclavians: Normal flow hemodynamics were seen in bilateral subclavian arteries.  Echo 08/01/17 Mod LVH, EF 60-65, no RWMA, Gr 2 DD, mod AS (mean 35, peak 60), LVOT/AV velocity ratio 0.21, trivial MR, mild LAE  Coronary CTA 07/25/17 IMPRESSION: 1. Coronary calcium score of 0. This was 0 percentile for age and sex matched control. 2. Normal coronary origin with right dominance. 3. No evidence of CAD. 4. Unusually thickened leaflets of the tricuspid aortic valve mild calcifications in the non-coronary cusp. Leaflets opening can't be evaluated as only diastolic images were obtained. An echocardiogram is recommended for further evaluation.  Chest CTA 07/19/17 IMPRESSION: 1. No thoracic aortic aneurysm or dissection. There are foci of calcification in the thoracic aorta and great vessels. There is mild calcification in the aortic valve. No evident pulmonary embolus. 2. There is a degree of centrilobular emphysematous change. No edema or consolidation. There is bibasilar atelectasis. 3. Single mildly prominent pretracheal lymph node, likely reactive in etiology. Aortic Atherosclerosis (ICD10-I70.0) and Emphysema (ICD10-J43.9).  Carotid US 12/14 There is no evidence of a hemodynamically significant carotid stenosis.   Past Medical History:  Diagnosis Date  . Allergy    SEASONAL  . Heart murmur   . Hypertension    Surgical Hx: The patient  has a past surgical history that includes Tonsillectomy; TEE without cardioversion (N/A, 12/06/2018); and TEE  without cardioversion (N/A, 01/20/2019).   Current Medications: Current Meds  Medication Sig  . acetaminophen (TYLENOL) 500 MG tablet Take 500 mg by mouth every 6 (six) hours as needed (for pain.).  Marland Kitchen apixaban (ELIQUIS) 5 MG TABS tablet Take 1 tablet (5 mg total) by mouth every 12 (twelve) hours.  . Calcium 600-200 MG-UNIT tablet Take 1 tablet by mouth daily.  . carboxymethylcellulose (REFRESH PLUS) 0.5 % SOLN Place 1 drop into both eyes 3 (three) times daily as needed (watery eyes.).  Marland Kitchen diltiazem (CARDIZEM CD) 300 MG 24 hr capsule Take 1 capsule (300 mg total) by mouth daily.  . metoprolol tartrate (LOPRESSOR) 50 MG tablet Take 2 tablets (100 mg total) by mouth 2 (two) times daily.  . nitroGLYCERIN (NITROSTAT) 0.4 MG SL tablet Place 1 tablet (0.4 mg total) under the tongue every 5 (five) minutes as needed for chest pain.  . Omega-3 1000 MG CAPS Take 1,000 mg by mouth daily.   . [DISCONTINUED] metoprolol tartrate (LOPRESSOR) 50 MG tablet Take 1.5 tablets (75 mg total) by mouth 2 (two) times daily.     Allergies:   Patient has no known allergies.   Social History   Tobacco Use  . Smoking status: Former Smoker    Last attempt to quit: 12/05/2011    Years since quitting: 7.1  . Smokeless tobacco: Never Used  Substance Use Topics  . Alcohol use: Yes    Comment: occasionally  . Drug use: No     Family Hx: The patient's family history includes Arthritis in her maternal grandmother and mother; Asthma in her brother; Cancer in her brother and mother; Diabetes in her brother and mother; Early death in her brother; Heart disease in her father, maternal grandmother, and paternal grandmother; Heart murmur in her brother, brother, and mother; Hypertension in her brother and mother.  ROS:   Please see the history of present illness.    Review of Systems  Cardiovascular: Positive for dyspnea on exertion.  Respiratory: Positive for cough, shortness of breath and wheezing.    All other systems  reviewed and are negative.   EKGs/Labs/Other Test Reviewed:    EKG:  EKG is   ordered today.  The ekg ordered today demonstrates atrial fibrillation, HR 118  Recent Labs: 10/04/2018: ALT 11 12/03/2018: Magnesium 1.8; TSH 0.991 01/17/2019: BUN 12; Creatinine, Ser 1.30; Hemoglobin 15.1; Platelets 326; Potassium 4.7; Sodium 140   Recent Lipid Panel Lab Results  Component Value Date/Time   CHOL 139 01/15/2019 10:49 AM   TRIG 147 01/15/2019 10:49 AM   HDL 40 01/15/2019 10:49 AM   CHOLHDL 3.5 01/15/2019 10:49 AM   CHOLHDL 3.2 09/26/2017 09:10 AM   LDLCALC 70 01/15/2019 10:49 AM   LDLCALC 95 09/26/2017 09:10 AM    Physical Exam:    VS:  BP 130/80   Pulse (!) 118   Ht 5\' 6"  (1.676 m)   Wt 173 lb 12.8 oz (78.8 kg)   SpO2 99%   BMI 28.05 kg/m     Wt Readings from Last 3 Encounters:  02/04/19 173 lb 12.8 oz (78.8 kg)  01/17/19 173 lb 12.8 oz (78.8 kg)  01/15/19 177 lb (80.3 kg)     Physical Exam  Constitutional: She is oriented to person, place,  and time. She appears well-developed and well-nourished. No distress.  HENT:  Head: Normocephalic and atraumatic.  Eyes: No scleral icterus.  Neck: JVD (JVP ~ 6 cm) present. No thyromegaly present.  Cardiovascular: An irregularly irregular rhythm present. Tachycardia present.  No murmur heard. Pulmonary/Chest: She has no wheezes. She has no rales.  Abdominal: Soft.  Musculoskeletal:        General: No edema.  Lymphadenopathy:    She has no cervical adenopathy.  Neurological: She is alert and oriented to person, place, and time.  Skin: Skin is warm and dry.  Psychiatric: She has a normal mood and affect.    ASSESSMENT & PLAN:    Chronic systolic CHF (congestive heart failure) (HCC) EF 30-35.  NYHA 2-3b.  She has symptoms of CHF and I believe she would feel better with some diuresis.  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  She has some baseline CKD.  Therefore, she should be ok without K+ supplementation with  Lasix.  -Start Lasix 40 mg once daily   -BMET 1 week  -Continue beta-blocker   -Eventually consider ACE or ARB if BP will allow  Persistent atrial fibrillation / Thrombus of atrial appendage Rate is uncontrolled.  Recent DCCV was canceled due to small LAA clot.  Dr. Nadean Corwin felt that TEE could be repeated in 1 month.    -Continue current dose of Diltiazem  -Increase Metoprolol to 100 mg twice daily   -Monitor BP and send readings to me in 1 week  -If BP can tolerate, will increase Metoprolol further  Essential hypertension   BP at goal.  Meds are being adjusted for atrial fibrillation as noted.    Dispo:  Return in about 2 weeks (around 02/18/2019) for Close Follow Up, w/ Richardson Dopp, PA-C.   Medication Adjustments/Labs and Tests Ordered: Current medicines are reviewed at length with the patient today.  Concerns regarding medicines are outlined above.  Tests Ordered: Orders Placed This Encounter  Procedures  . Basic metabolic panel  . EKG 12-Lead   Medication Changes: Meds ordered this encounter  Medications  . metoprolol tartrate (LOPRESSOR) 50 MG tablet    Sig: Take 2 tablets (100 mg total) by mouth 2 (two) times daily.    Dispense:  270 tablet    Refill:  3  . furosemide (LASIX) 40 MG tablet    Sig: Take 1 tablet (40 mg total) by mouth daily.    Dispense:  30 tablet    Refill:  6    Signed, Richardson Dopp, PA-C  02/04/2019 9:59 AM    [facility] [address], [place], [place]  [address] Phone: [phone]; Fax: [phone]

## 2019-02-04 ENCOUNTER — Ambulatory Visit: Payer: Medicare Other | Admitting: Physician Assistant

## 2019-02-04 ENCOUNTER — Encounter: Payer: Self-pay | Admitting: Physician Assistant

## 2019-02-04 VITALS — BP 130/80 | HR 118 | Ht 66.0 in | Wt 173.8 lb

## 2019-02-04 DIAGNOSIS — I5022 Chronic systolic (congestive) heart failure: Secondary | ICD-10-CM

## 2019-02-04 DIAGNOSIS — I10 Essential (primary) hypertension: Secondary | ICD-10-CM

## 2019-02-04 DIAGNOSIS — I4819 Other persistent atrial fibrillation: Secondary | ICD-10-CM

## 2019-02-04 DIAGNOSIS — I513 Intracardiac thrombosis, not elsewhere classified: Secondary | ICD-10-CM

## 2019-02-04 MED ORDER — FUROSEMIDE 40 MG PO TABS: 40.0000 mg | ORAL_TABLET | Freq: Every day | ORAL | 6 refills | Status: DC

## 2019-02-04 MED ORDER — METOPROLOL TARTRATE 50 MG PO TABS: 100.0000 mg | ORAL_TABLET | Freq: Two times a day (BID) | ORAL | 3 refills | Status: DC

## 2019-02-04 NOTE — Patient Instructions (Addendum)
Medication Instructions:   START TAKING METOPROLOL  100 MG (  TWO 50 MG )TWICE A DAY   START TAKING LASIX 40 MG ONCE  DAY   If you need a refill on your cardiac medications before your next appointment, please call your pharmacy.   Lab work: RETURN FOR A BMET IN ONE WEEK   If you have labs (blood work) drawn today and your tests are completely normal, you will receive your results only by: Marland Kitchen MyChart Message (if you have MyChart) OR . A paper copy in the mail If you have any lab test that is abnormal or we need to change your treatment, we will call you to review the results.  Testing/Procedures: NONE ORDERED  TODAY   Follow-Up: WITH WEAVER IN 2 WEEKS ON A DAY DR COOPER IN OFFICE  Any Other Special Instructions Will Be Listed Below (If Applicable).  CHECK BLOOD PRESSURE DAILY  AND CALL WITH READINGS ON Friday OR EARLY NEXT WEEK ON A DAY THAT WORKS FOR YOU

## 2019-02-07 ENCOUNTER — Telehealth: Payer: Self-pay | Admitting: Physician Assistant

## 2019-02-07 NOTE — Telephone Encounter (Signed)
Per Scott's request, here are her last BP readings Wed: 152/93 Pulse 86 Thurs: 127/84 Pulse 76 Friday: 120/80 Pulse 88  Will be in for lab work Tuesday morning.

## 2019-02-11 ENCOUNTER — Other Ambulatory Visit: Payer: Medicare Other

## 2019-02-11 DIAGNOSIS — I5022 Chronic systolic (congestive) heart failure: Secondary | ICD-10-CM

## 2019-02-11 DIAGNOSIS — I4819 Other persistent atrial fibrillation: Secondary | ICD-10-CM

## 2019-02-11 LAB — BASIC METABOLIC PANEL
BUN/Creatinine Ratio: 18 (ref 12–28)
BUN: 26 mg/dL (ref 8–27)
CO2: 22 mmol/L (ref 20–29)
Calcium: 9.6 mg/dL (ref 8.7–10.3)
Chloride: 98 mmol/L (ref 96–106)
Creatinine, Ser: 1.42 mg/dL — ABNORMAL HIGH (ref 0.57–1.00)
GFR calc Af Amer: 45 mL/min/{1.73_m2} — ABNORMAL LOW (ref >59)
GFR calc non Af Amer: 39 mL/min/{1.73_m2} — ABNORMAL LOW (ref >59)
Glucose: 110 mg/dL — ABNORMAL HIGH (ref 65–99)
Potassium: 4.7 mmol/L (ref 3.5–5.2)
SODIUM: 136 mmol/L (ref 134–144)

## 2019-02-11 NOTE — Telephone Encounter (Signed)
BP and HR look good. Continue current medications and follow up as planned.  Richardson Dopp, PA-C    02/11/2019 5:04 PM

## 2019-02-12 NOTE — Telephone Encounter (Signed)
SPOKE WITH PT AND PT AWARE OF FEEDBACK  AND WILL FOLLOW UP AS PLANNED

## 2019-02-18 NOTE — Progress Notes (Signed)
Cardiology Office Note:    Date:  02/19/2019   ID:  Rhythm, Gubbels 04-01-1951, MRN [id number]  PCP:  Antony Blackbird, MD  Cardiologist:  Sherren Mocha, MD  Electrophysiologist:  None   Referring MD: Antony Blackbird, MD   Chief Complaint  Patient presents with  . Follow-up    AFib    History of Present Illness:    Tracy James is a 68 y.o. female with persistent AFib with associated tachycardia induced cardiomyopathy and systolic HF, aortic stenosis, hypertension, tobacco use. Coronary CTA in August 2018 demonstrated a calcium score of 0 and no evidence of CAD.  She was admitted in 11/2018 with AF with RVR.  EF was 40-45 on Echo and TEE demonstrated thrombus in the R and L atria and possible thrombus adjacent to the heart.  After a month of anticoagulation, she underwent repeat TEE-DCCV 01/20/2019.  Unfortunately, the TEE continued to show residual clot in the LA.  Therefore, DCCV was not performed.  I saw her last 02/04/2019.  She had heart failure symptoms and uncontrolled HR.  I adjusted her beta-blocker and started her on Lasix.     Tracy James returns for follow up on atrial fibrillation and congestive heart failure today with her daughter.  Since last seen, she has been doing well.  Her breathing is improved.  She has not had paroxysmal nocturnal dyspnea, leg swelling, chest pain, syncope.  She has not had bleeding issues.  She has taken her Apixaban twice daily every day.    Prior CV studies:   The following studies were reviewed today:   TEE 01/20/2019 EF 30-35, atrial level shunting, small fixed LA thrombus on interior of LAA, no RAA clot  Transesophageal echocardiogram 01/02/2019 EF 30-35, diffuse HK, AV cusp separation severely reduced, mild AI, mild MR, moderate LAE, positive LAA clot, severely reduced RV SF, moderate RAE with positive clot in the atrial cavity, small pericardial effusion, poss thrombus in pericardial space  Echo 12/04/2018 Mild LVH, EF 40-45, diffuse HK,  mild to moderate aortic stenosis (mean 19, peak 30), moderate MR, mild LAE, moderately reduced RV SF, PASP 28  CXR 12/03/18 IMPRESSION: 1. Diffuse peribronchial cuffing concerning for bronchitis, with probable left lower lobe bronchopneumonia. 2. Trace bilateral pleural effusions. 3. Aortic atherosclerosis.  Carotid US 10/10/2018 Summary: Right Carotid: There is no evidence of stenosis in the right ICA. Left Carotid: There is no evidence of stenosis in the left ICA. Vertebrals: Bilateral vertebral arteries demonstrate antegrade flow. Subclavians: Normal flow hemodynamics were seen in bilateral subclavian arteries.  Echo 08/01/17 Mod LVH, EF 60-65, no RWMA, Gr 2 DD, mod AS (mean 35, peak 60), LVOT/AV velocity ratio 0.21, trivial MR, mild LAE  Coronary CTA 07/25/17 IMPRESSION: 1. Coronary calcium score of 0. This was 0 percentile for age and sex matched control. 2. Normal coronary origin with right dominance. 3. No evidence of CAD. 4. Unusually thickened leaflets of the tricuspid aortic valve mild calcifications in the non-coronary cusp. Leaflets opening can't be evaluated as only diastolic images were obtained. An echocardiogram is recommended for further evaluation.  Chest CTA 07/19/17 IMPRESSION: 1. No thoracic aortic aneurysm or dissection. There are foci of calcification in the thoracic aorta and great vessels. There is mild calcification in the aortic valve. No evident pulmonary embolus. 2. There is a degree of centrilobular emphysematous change. No edema or consolidation. There is bibasilar atelectasis. 3. Single mildly prominent pretracheal lymph node, likely reactive in etiology. Aortic Atherosclerosis (ICD10-I70.0) and Emphysema (ICD10-J43.9).  Carotid US 12/14 There is no evidence of a hemodynamically significant carotid stenosis.   Past Medical History:  Diagnosis Date  . Allergy    SEASONAL  . Heart murmur   . Hypertension    Surgical Hx:  The patient  has a past surgical history that includes Tonsillectomy; TEE without cardioversion (N/A, 12/06/2018); and TEE without cardioversion (N/A, 01/20/2019).   Current Medications: Current Meds  Medication Sig  . acetaminophen (TYLENOL) 500 MG tablet Take 500 mg by mouth every 6 (six) hours as needed (for pain.).  Marland Kitchen apixaban (ELIQUIS) 5 MG TABS tablet Take 1 tablet (5 mg total) by mouth every 12 (twelve) hours.  . Calcium 600-200 MG-UNIT tablet Take 1 tablet by mouth daily.  . carboxymethylcellulose (REFRESH PLUS) 0.5 % SOLN Place 1 drop into both eyes 3 (three) times daily as needed (watery eyes.).  Marland Kitchen diltiazem (CARDIZEM CD) 300 MG 24 hr capsule Take 1 capsule (300 mg total) by mouth daily.  . furosemide (LASIX) 40 MG tablet Take 1 tablet (40 mg total) by mouth daily.  . metoprolol tartrate (LOPRESSOR) 50 MG tablet Take 2 tablets (100 mg total) by mouth 2 (two) times daily.  . nitroGLYCERIN (NITROSTAT) 0.4 MG SL tablet Place 1 tablet (0.4 mg total) under the tongue every 5 (five) minutes as needed for chest pain.  . Omega-3 1000 MG CAPS Take 1,000 mg by mouth daily.      Allergies:   Patient has no known allergies.   Social History   Tobacco Use  . Smoking status: Former Smoker    Last attempt to quit: 12/05/2011    Years since quitting: 7.2  . Smokeless tobacco: Never Used  Substance Use Topics  . Alcohol use: Yes    Comment: occasionally  . Drug use: No     Family Hx: The patient's family history includes Arthritis in her maternal grandmother and mother; Asthma in her brother; Cancer in her brother and mother; Diabetes in her brother and mother; Early death in her brother; Heart disease in her father, maternal grandmother, and paternal grandmother; Heart murmur in her brother, brother, and mother; Hypertension in her brother and mother.  ROS:   Please see the history of present illness.    ROS All other systems reviewed and are negative.   EKGs/Labs/Other Test Reviewed:     EKG:  EKG is  ordered today.  The ekg ordered today demonstrates atrial fibrillation, heart rate 112, normal axis  Recent Labs: 10/04/2018: ALT 11 12/03/2018: Magnesium 1.8; TSH 0.991 01/17/2019: Hemoglobin 15.1; Platelets 326 02/11/2019: BUN 26; Creatinine, Ser 1.42; Potassium 4.7; Sodium 136   Recent Lipid Panel Lab Results  Component Value Date/Time   CHOL 139 01/15/2019 10:49 AM   TRIG 147 01/15/2019 10:49 AM   HDL 40 01/15/2019 10:49 AM   CHOLHDL 3.5 01/15/2019 10:49 AM   CHOLHDL 3.2 09/26/2017 09:10 AM   LDLCALC 70 01/15/2019 10:49 AM   LDLCALC 95 09/26/2017 09:10 AM    Physical Exam:    VS:  BP 120/78   Pulse (!) 53   Ht 5\' 6"  (1.676 m)   Wt 169 lb 12.8 oz (77 kg)   SpO2 98%   BMI 27.41 kg/m     Wt Readings from Last 3 Encounters:  02/19/19 169 lb 12.8 oz (77 kg)  02/04/19 173 lb 12.8 oz (78.8 kg)  01/17/19 173 lb 12.8 oz (78.8 kg)     Physical Exam  Constitutional: She is oriented to person, place, and time. She  appears well-developed and well-nourished. No distress.  HENT:  Head: Normocephalic and atraumatic.  Eyes: No scleral icterus.  Neck: Neck supple. No JVD present. No thyromegaly present.  Cardiovascular: S1 normal, S2 normal and normal heart sounds. An irregularly irregular rhythm present. Tachycardia present.  No murmur heard. Pulmonary/Chest: Effort normal. She has no rales.  Abdominal: Soft. There is no hepatomegaly.  Musculoskeletal:        General: No edema.  Lymphadenopathy:    She has no cervical adenopathy.  Neurological: She is alert and oriented to person, place, and time.  Skin: Skin is warm and dry.  Psychiatric: She has a normal mood and affect.    ASSESSMENT & PLAN:    Persistent atrial fibrillation / Thrombus of atrial appendage Her HR remains uncontrolled.  TEE in 01/2019 continued to demonstrate residual LAA clot.  She was to get set up for a repeat TEE-DCCV today.  However, elective procedures at the hospital are being placed  on hold in light of the COVID-19 pandemic.  I reviewed her case with Dr. Lovena Le (EP).  She would benefit from antiarrhythmic drug therapy to maintain normal sinus rhythm.  She was not started on this previously b/c of biatrial thrombus.  She is young and it would be best to avoid Amiodarone long term.  We feel that Dofetilide would be the best option for her.  The best way to proceed with Dofetilide would be to admit her next week with a TEE prior to her first dose of Dofetilide.  However, elective Dofetilide admissions are on hold.  She is stable at this time. Volume status is improved.  She is tolerating AFib with RVR at this time.  Therefore, I will continue rate control for now.  We will eventually plan on Dofetilide loading once it is deemed safe from an infectious disease standpoint.  If at any time she starts to have more difficulty tolerating atrial fibrillation, we will need to proceed with urgent admission for Dofetilide and DCCV.  -Continue Diltiazem, Metoprolol at current dose  -Start Digoxin 0.0625 mg once daily   -BMET, Dig level in 1 week  -Would try to avoid increasing dose of Dig given CKD  -FU with me in 1 week with an ECG  Chronic systolic CHF (congestive heart failure) (HCC) EF 30-25.  NYHA 2.  Volume is improved.  Continue beta-blocker, Lasix.  Eventually try to add ACE inhibitor or angiotensin receptor blocker.    CKD (chronic kidney disease) stage 3, GFR 30-59 ml/min (HCC) Will keep a close eye on renal function given initiation of Digoxin.  Essential hypertension The patient's blood pressure is controlled on her current regimen.  Continue current therapy.    Dispo:  Return in about 1 week (around 02/26/2019) for Close Follow Up, w/ Richardson Dopp, PA-C.   Medication Adjustments/Labs and Tests Ordered: Current medicines are reviewed at length with the patient today.  Concerns regarding medicines are outlined above.  Tests Ordered: Orders Placed This Encounter  Procedures   . CBC  . Basic metabolic panel  . Digoxin level  . EKG 12-Lead   Medication Changes: Meds ordered this encounter  Medications  . digoxin (LANOXIN) 0.125 MG tablet    Sig: Take 0.5 tablets (0.0625 mg total) by mouth daily.    Dispense:  15 tablet    Refill:  11    Order Specific Question:   Supervising Provider    Answer:   Evans Lance [[id number]]    Signed, Richardson Dopp, PA-C  02/19/2019 12:00 PM    [facility] [place], [place], [place]  [address] Phone: [phone]; Fax: [phone]

## 2019-02-19 ENCOUNTER — Encounter: Payer: Self-pay | Admitting: Physician Assistant

## 2019-02-19 ENCOUNTER — Ambulatory Visit: Payer: Medicare Other | Admitting: Physician Assistant

## 2019-02-19 ENCOUNTER — Other Ambulatory Visit: Payer: Self-pay

## 2019-02-19 ENCOUNTER — Encounter (INDEPENDENT_AMBULATORY_CARE_PROVIDER_SITE_OTHER): Payer: Self-pay

## 2019-02-19 VITALS — BP 120/78 | HR 53 | Ht 66.0 in | Wt 169.8 lb

## 2019-02-19 DIAGNOSIS — I4819 Other persistent atrial fibrillation: Secondary | ICD-10-CM | POA: Diagnosis not present

## 2019-02-19 DIAGNOSIS — I10 Essential (primary) hypertension: Secondary | ICD-10-CM

## 2019-02-19 DIAGNOSIS — I5022 Chronic systolic (congestive) heart failure: Secondary | ICD-10-CM

## 2019-02-19 DIAGNOSIS — N183 Chronic kidney disease, stage 3 unspecified: Secondary | ICD-10-CM

## 2019-02-19 DIAGNOSIS — I513 Intracardiac thrombosis, not elsewhere classified: Secondary | ICD-10-CM

## 2019-02-19 MED ORDER — DIGOXIN 125 MCG PO TABS: 0.0625 mg | ORAL_TABLET | Freq: Every day | ORAL | 11 refills | Status: DC

## 2019-02-19 NOTE — Patient Instructions (Addendum)
Medication Instructions:  1. Start Digoxin 0.125 mg take 1/2 tablet once daily   Your physician recommends that you continue on your current medications as directed. Please refer to the Current Medication list given to you today.   If you need a refill on your cardiac medications before your next appointment, please call your pharmacy.   Lab work:    In 1 week - BMET, Digoxin level  **On the day of your blood work DO NOT take your Digoxin until after your blood is drawn**  If you have labs (blood work) drawn today and your tests are completely normal, you will receive your results only by: Marland Kitchen MyChart Message (if you have MyChart) OR . A paper copy in the mail If you have any lab test that is abnormal or we need to change your treatment, we will call you to review the results.  Testing/Procedures: NONE ORDERED  TODAY   Follow-Up: . Richardson Dopp, PA-C in 1 week with Ekg   Any Other Special Instructions Will Be Listed Below (If Applicable).

## 2019-02-20 ENCOUNTER — Ambulatory Visit: Payer: Medicare Other | Admitting: Family Medicine

## 2019-02-26 ENCOUNTER — Telehealth: Payer: Self-pay | Admitting: *Deleted

## 2019-02-26 ENCOUNTER — Other Ambulatory Visit: Payer: Medicare Other

## 2019-02-26 NOTE — Telephone Encounter (Signed)
   Cardiac Questionnaire:       *A YES to any of these questions would result in the appointment being kept. *If all the answers to these questions are NO, we should indicate that given the current situation regarding the worldwide coronarvirus pandemic, at the recommendation of the CDC, we are looking to limit gatherings in our waiting area, and thus will reschedule their appointment beyond four weeks from today.   _____________   OYDXA-12 Pre-Screening Questions:  . Do you currently have a fever?  NO  (yes = cancel and refer to pcp for e-visit) . Have you recently travelled on a cruise, internationally, or to [place], [place], [place], [place], [place], or [place], [place] [employer]) ?   NO   (yes = cancel, stay home, monitor symptoms, and contact pcp or initiate e-visit if symptoms develop) . Have you been in contact with someone that is currently pending confirmation of Covid19 testing or has been confirmed to have the [place] virus?    NO  (yes = cancel, stay home, away from tested individual, monitor symptoms, and contact pcp or initiate e-visit if symptoms develop) . Are you currently experiencing fatigue or cough?  NO  (yes = pt should be prepared to have a mask placed at the time of their visit).

## 2019-02-27 DIAGNOSIS — I5022 Chronic systolic (congestive) heart failure: Secondary | ICD-10-CM | POA: Insufficient documentation

## 2019-02-27 NOTE — Progress Notes (Signed)
Cardiology Office Note:    Date:  02/28/2019   ID:  Tracy James [birth year], MRN [id number]  PCP:  Antony Blackbird, MD  Cardiologist:  Sherren Mocha, MD   Electrophysiologist:  None   Referring MD: Antony Blackbird, MD   Chief Complaint  Patient presents with  . Follow-up    AFib     History of Present Illness:    Tracy James is a 68 y.o. female with persistent AFib with associated tachycardia induced cardiomyopathy and systolic HF,aortic stenosis, hypertension, tobacco use. Coronary CTA in August 2018 demonstrated a calcium score of 0 and no evidence of CAD.She was admitted in 11/2018 with AF with RVR. EF was 40-45 on Echo and TEE demonstrated thrombus in the R and L atria and possible thrombus adjacent to the heart. After a month of anticoagulation, she underwent repeat TEE-DCCV 01/20/2019. Unfortunately, the TEE continued to show residual clot in the LA. Therefore, DCCV was not performed.  She was last seen in follow up on 02/19/2019.  Her HR remained uncontrolled.  Unfortunately, repeat DCCV was not arranged due to COVID-19 (elective procedures on hold).  I placed her on Digoxin to help with rate control.     Tracy James returns for follow up.  She denies chest pain, shortness of breath, syncope, orthopnea, PND or significant pedal edema.  She denies dizziness or fatigue.    Prior CV studies:   The following studies were reviewed today:  TEE 01/20/2019 EF 30-35, atrial level shunting, small fixed LA thrombus on interior of LAA, no RAA clot  Transesophageal echocardiogram 01/02/2019 EF 30-35, diffuse HK, AV cusp separation severely reduced, mild AI, mild MR, moderate LAE, positive LAA clot, severely reduced RV SF, moderate RAE with positive clot in the atrial cavity, small pericardial effusion, poss thrombus in pericardial space  Echo 12/04/2018 Mild LVH, EF 40-45, diffuse HK, mild to moderate aortic stenosis (mean 19, peak 30), moderate MR, mild LAE, moderately reduced  RV SF, PASP 28  CXR 12/03/18 IMPRESSION: 1. Diffuse peribronchial cuffing concerning for bronchitis, with probable left lower lobe bronchopneumonia. 2. Trace bilateral pleural effusions. 3. Aortic atherosclerosis.  Carotid US 10/10/2018 Summary: Right Carotid: There is no evidence of stenosis in the right ICA. Left Carotid: There is no evidence of stenosis in the left ICA. Vertebrals: Bilateral vertebral arteries demonstrate antegrade flow. Subclavians: Normal flow hemodynamics were seen in bilateral subclavian arteries.  Echo 08/01/17 Mod LVH, EF 60-65, no RWMA, Gr 2 DD, mod AS (mean 35, peak 60), LVOT/AV velocity ratio 0.21, trivial MR, mild LAE  Coronary CTA 07/25/17 IMPRESSION: 1. Coronary calcium score of 0. This was 0 percentile for age and sex matched control. 2. Normal coronary origin with right dominance. 3. No evidence of CAD. 4. Unusually thickened leaflets of the tricuspid aortic valve mild calcifications in the non-coronary cusp. Leaflets opening can't be evaluated as only diastolic images were obtained. An echocardiogram is recommended for further evaluation.  Chest CTA 07/19/17 IMPRESSION: 1. No thoracic aortic aneurysm or dissection. There are foci of calcification in the thoracic aorta and great vessels. There is mild calcification in the aortic valve. No evident pulmonary embolus. 2. There is a degree of centrilobular emphysematous change. No edema or consolidation. There is bibasilar atelectasis. 3. Single mildly prominent pretracheal lymph node, likely reactive in etiology. Aortic Atherosclerosis (ICD10-I70.0) and Emphysema (ICD10-J43.9).  Carotid US 12/14 There is no evidence of a hemodynamically significant carotid stenosis.  Past Medical History:  Diagnosis Date  . Allergy  SEASONAL  . Heart murmur   . Hypertension    Surgical Hx: The patient  has a past surgical history that includes Tonsillectomy; TEE without cardioversion  (N/A, 12/06/2018); and TEE without cardioversion (N/A, 01/20/2019).   Current Medications: No outpatient medications have been marked as taking for the 02/28/19 encounter (Office Visit) with Richardson Dopp T, PA-C.     Allergies:   Patient has no known allergies.   Social History   Tobacco Use  . Smoking status: Former Smoker    Last attempt to quit: 12/05/2011    Years since quitting: 7.2  . Smokeless tobacco: Never Used  Substance Use Topics  . Alcohol use: Yes    Comment: occasionally  . Drug use: No     Family Hx: The patient's family history includes Arthritis in her maternal grandmother and mother; Asthma in her brother; Cancer in her brother and mother; Diabetes in her brother and mother; Early death in her brother; Heart disease in her father, maternal grandmother, and paternal grandmother; Heart murmur in her brother, brother, and mother; Hypertension in her brother and mother.  ROS:   Please see the history of present illness.    ROS All other systems reviewed and are negative.   EKGs/Labs/Other Test Reviewed:    EKG:  EKG is  ordered today.  The ekg ordered today demonstrates atrial fibrillation, HR 62  Recent Labs: 10/04/2018: ALT 11 12/03/2018: Magnesium 1.8; TSH 0.991 01/17/2019: Hemoglobin 15.1; Platelets 326 02/11/2019: BUN 26; Creatinine, Ser 1.42; Potassium 4.7; Sodium 136   Recent Lipid Panel Lab Results  Component Value Date/Time   CHOL 139 01/15/2019 10:49 AM   TRIG 147 01/15/2019 10:49 AM   HDL 40 01/15/2019 10:49 AM   CHOLHDL 3.5 01/15/2019 10:49 AM   CHOLHDL 3.2 09/26/2017 09:10 AM   LDLCALC 70 01/15/2019 10:49 AM   LDLCALC 95 09/26/2017 09:10 AM    Physical Exam:    VS:  BP 140/60   Pulse 62   Ht 5\' 6"  (1.676 m)   Wt 169 lb 12.8 oz (77 kg)   SpO2 99%   BMI 27.41 kg/m     Wt Readings from Last 3 Encounters:  02/28/19 169 lb 12.8 oz (77 kg)  02/19/19 169 lb 12.8 oz (77 kg)  02/04/19 173 lb 12.8 oz (78.8 kg)     Physical Exam   Constitutional: She is oriented to person, place, and time. She appears well-developed and well-nourished. No distress.  HENT:  Head: Normocephalic and atraumatic.  Eyes: No scleral icterus.  Neck: Neck supple. No JVD present. No thyromegaly present.  Cardiovascular: Normal rate, S1 normal and S2 normal. An irregularly irregular rhythm present.  Murmur heard.  Systolic murmur is present with a grade of 2/6 at the upper right sternal border. Pulmonary/Chest: Effort normal. She has no rales.  Abdominal: Soft. There is no hepatomegaly.  Musculoskeletal:        General: No edema.  Lymphadenopathy:    She has no cervical adenopathy.  Neurological: She is alert and oriented to person, place, and time.  Skin: Skin is warm and dry.  Psychiatric: She has a normal mood and affect.    ASSESSMENT & PLAN:    Persistent atrial fibrillation She remains in AFib but her HR is much better.  Her HR is now in the 60s.  BMET, Dig level is pending today.  I will await those results.  But, I plan to have her change her Digoxin to every other day (as long as  her Dig level is ok).  Ultimately, she will need admission to the hospital for Dofetilide with a TEE prior to this.  There are no elective admissions currently due to COVID-19.  I will have her return in 1 month.  We can convert this to telephone or video if needed.    Chronic systolic CHF (congestive heart failure) (HCC) EF 30-25.  NYHA 2.  Volume is stable.  Continue current medical therapy.  Will eventually get an ACE or ARB added.    CKD (chronic kidney disease) stage 3, GFR 30-59 ml/min (HCC) Repeat BMET today  Essential hypertension Fair control.  Continue current Rx.   Dispo:  Return in about 4 weeks (around 03/28/2019) for Routine Follow Up, w/ Dr. Burt Knack, or Richardson Dopp, PA-C.   Medication Adjustments/Labs and Tests Ordered: Current medicines are reviewed at length with the patient today.  Concerns regarding medicines are outlined above.   Tests Ordered: Orders Placed This Encounter  Procedures  . EKG 12-Lead   Medication Changes: No orders of the defined types were placed in this encounter.   Signed, Richardson Dopp, PA-C  02/28/2019 10:17 AM    [facility] [place], [place], [place]  [address] Phone: [phone]; Fax: [phone]

## 2019-02-28 ENCOUNTER — Other Ambulatory Visit: Payer: Medicare Other | Admitting: *Deleted

## 2019-02-28 ENCOUNTER — Encounter: Payer: Self-pay | Admitting: Physician Assistant

## 2019-02-28 ENCOUNTER — Ambulatory Visit: Payer: Medicare Other | Admitting: Physician Assistant

## 2019-02-28 ENCOUNTER — Other Ambulatory Visit: Payer: Self-pay

## 2019-02-28 DIAGNOSIS — I5022 Chronic systolic (congestive) heart failure: Secondary | ICD-10-CM

## 2019-02-28 DIAGNOSIS — I4819 Other persistent atrial fibrillation: Secondary | ICD-10-CM | POA: Diagnosis not present

## 2019-02-28 DIAGNOSIS — N183 Chronic kidney disease, stage 3 unspecified: Secondary | ICD-10-CM

## 2019-02-28 DIAGNOSIS — I10 Essential (primary) hypertension: Secondary | ICD-10-CM

## 2019-02-28 LAB — BASIC METABOLIC PANEL
BUN/Creatinine Ratio: 16 (ref 12–28)
BUN: 23 mg/dL (ref 8–27)
CO2: 23 mmol/L (ref 20–29)
CREATININE: 1.41 mg/dL — AB (ref 0.57–1.00)
Calcium: 10.1 mg/dL (ref 8.7–10.3)
Chloride: 99 mmol/L (ref 96–106)
GFR calc Af Amer: 45 mL/min/{1.73_m2} — ABNORMAL LOW (ref >59)
GFR, EST NON AFRICAN AMERICAN: 39 mL/min/{1.73_m2} — AB (ref >59)
GLUCOSE: 106 mg/dL — AB (ref 65–99)
Potassium: 4.8 mmol/L (ref 3.5–5.2)
Sodium: 141 mmol/L (ref 134–144)

## 2019-02-28 LAB — DIGOXIN LEVEL: DIGOXIN, SERUM: 0.4 ng/mL — AB (ref 0.5–0.9)

## 2019-02-28 NOTE — Patient Instructions (Addendum)
Medication Instructions:   Your physician recommends that you continue on your current medications as directed. Please refer to the Current Medication list given to you today.  If you need a refill on your cardiac medications before your next appointment, please call your pharmacy.   Lab work: NONE ORDERED  TODAY   If you have labs (blood work) drawn today and your tests are completely normal, you will receive your results only by: Marland Kitchen MyChart Message (if you have MyChart) OR . A paper copy in the mail If you have any lab test that is abnormal or we need to change your treatment, we will call you to review the results.  Testing/Procedures: NONE ORDERED  TODAY   Follow-Up: IN ONE MONTH WITH WEAVER OR COOPER (NEEDED)    Any Other Special Instructions Will Be Listed Below (If Applicable).

## 2019-03-03 ENCOUNTER — Telehealth: Payer: Self-pay | Admitting: *Deleted

## 2019-03-03 ENCOUNTER — Other Ambulatory Visit: Payer: Self-pay | Admitting: *Deleted

## 2019-03-03 MED ORDER — DIGOXIN 125 MCG PO TABS: 0.0625 mg | ORAL_TABLET | ORAL | 11 refills | Status: DC

## 2019-03-03 NOTE — Telephone Encounter (Signed)
LMOVM OF RESULTS AND TO TAKE DIGOXIN EVERY OTHER DAY.PT WAS TOLD TO CONTACT CLINIC BACK TO MAKE SURE SHE GOT RESULTS AND INSTRUCTIONS

## 2019-03-25 ENCOUNTER — Telehealth: Payer: Self-pay | Admitting: Physician Assistant

## 2019-03-25 NOTE — Telephone Encounter (Signed)
Phone visit on 04/01/2019. Patient does not have a smartphone.    Phone Call to obtain consent   -  03/25/2019         Virtual Visit Pre-Appointment Phone Call  "(Name), I am calling you today to discuss your upcoming appointment. We are currently trying to limit exposure to the virus that causes COVID-19 by seeing patients at home rather than in the office."  1. "What is the BEST phone number to call the day of the visit?" - include this in appointment notes  2. Do you have or have access to (through a family member/friend) a smartphone with video capability that we can use for your visit?" a. If yes - list this number in appt notes as cell (if different from BEST phone #) and list the appointment type as a VIDEO visit in appointment notes b. If no - list the appointment type as a PHONE visit in appointment notes  3. Confirm consent - "In the setting of the current Covid19 crisis, you are scheduled for a (phone or video) visit with your provider on (date) at (time).  Just as we do with many in-office visits, in order for you to participate in this visit, we must obtain consent.  If you'd like, I can send this to your mychart (if signed up) or email for you to review.  Otherwise, I can obtain your verbal consent now.  All virtual visits are billed to your insurance company just like a normal visit would be.  By agreeing to a virtual visit, we'd like you to understand that the technology does not allow for your provider to perform an examination, and thus may limit your provider's ability to fully assess your condition. If your provider identifies any concerns that need to be evaluated in person, we will make arrangements to do so.  Finally, though the technology is pretty good, we cannot assure that it will always work on either your or our end, and in the setting of a video visit, we may have to convert it to a phone-only visit.  In either situation, we cannot ensure that we have a  secure connection.  Are you willing to proceed?" STAFF: Did the patient verbally acknowledge consent to telehealth visit? Document YES/NO here: YES  4. Advise patient to be prepared - "Two hours prior to your appointment, go ahead and check your blood pressure, pulse, oxygen saturation, and your weight (if you have the equipment to check those) and write them all down. When your visit starts, your provider will ask you for this information. If you have an Apple Watch or Kardia device, please plan to have heart rate information ready on the day of your appointment. Please have a pen and paper handy nearby the day of the visit as well."  5. Give patient instructions for MyChart download to smartphone OR Doximity/Doxy.me as below if video visit (depending on what platform provider is using)  6. Inform patient they will receive a phone call 15 minutes prior to their appointment time (may be from unknown caller ID) so they should be prepared to answer    TELEPHONE CALL NOTE  KASSITY WOODSON has been deemed a candidate for a follow-up tele-health visit to limit community exposure during the Covid-19 pandemic. I spoke with the patient via phone to ensure availability of phone/video source, confirm preferred email & phone number, and discuss instructions and expectations.  I reminded EXILDA WILHITE to be prepared with any vital sign  and/or heart rhythm information that could potentially be obtained via home monitoring, at the time of her visit. I reminded CHRYSA RAMPY to expect a phone call prior to her visit.  Thayer Headings 03/25/2019 2:09 PM   INSTRUCTIONS FOR DOWNLOADING THE MYCHART APP TO SMARTPHONE  - The patient must first make sure to have activated MyChart and know their login information - If Apple, go to [employer] and type in MyChart in the search bar and download the app. If Android, ask patient to go to [employer] and type in [place] in the search bar and download the app. The app  is free but as with any other app downloads, their phone may require them to verify saved payment information or Apple/Android password.  - The patient will need to then log into the app with their MyChart username and password, and select [facility] as their healthcare provider to link the account. When it is time for your visit, go to the MyChart app, find appointments, and click Begin Video Visit. Be sure to Select Allow for your device to access the Microphone and Camera for your visit. You will then be connected, and your provider will be with you shortly.  **If they have any issues connecting, or need assistance please contact MyChart service desk [phone])**  **If using a computer, in order to ensure the best quality for their visit they will need to use either of the following Internet Browsers: [employer], or Google Chrome**  IF USING DOXIMITY or DOXY.ME - The patient will receive a link just prior to their visit by text.     FULL LENGTH CONSENT FOR TELE-HEALTH VISIT   I hereby voluntarily request, consent and authorize [facility] and its employed or contracted physicians, physician assistants, nurse practitioners or other licensed health care professionals (the Practitioner), to provide me with telemedicine health care services (the Services") as deemed necessary by the treating Practitioner. I acknowledge and consent to receive the Services by the Practitioner via telemedicine. I understand that the telemedicine visit will involve communicating with the Practitioner through live audiovisual communication technology and the disclosure of certain medical information by electronic transmission. I acknowledge that I have been given the opportunity to request an in-person assessment or other available alternative prior to the telemedicine visit and am voluntarily participating in the telemedicine visit.  I understand that I have the right to withhold or withdraw my  consent to the use of telemedicine in the course of my care at any time, without affecting my right to future care or treatment, and that the Practitioner or I may terminate the telemedicine visit at any time. I understand that I have the right to inspect all information obtained and/or recorded in the course of the telemedicine visit and may receive copies of available information for a reasonable fee.  I understand that some of the potential risks of receiving the Services via telemedicine include:   Delay or interruption in medical evaluation due to technological equipment failure or disruption;  Information transmitted may not be sufficient (e.g. poor resolution of images) to allow for appropriate medical decision making by the Practitioner; and/or   In rare instances, security protocols could fail, causing a breach of personal health information.  Furthermore, I acknowledge that it is my responsibility to provide information about my medical history, conditions and care that is complete and accurate to the best of my ability. I acknowledge that Practitioner's advice, recommendations, and/or decision may be based  on factors not within their control, such as incomplete or inaccurate data provided by me or distortions of diagnostic images or specimens that may result from electronic transmissions. I understand that the practice of medicine is not an exact science and that Practitioner makes no warranties or guarantees regarding treatment outcomes. I acknowledge that I will receive a copy of this consent concurrently upon execution via email to the email address I last provided but may also request a printed copy by calling the office of [facility].    I understand that my insurance will be billed for this visit.   I have read or had this consent read to me.  I understand the contents of this consent, which adequately explains the benefits and risks of the Services being provided via telemedicine.    I have been provided ample opportunity to ask questions regarding this consent and the Services and have had my questions answered to my satisfaction.  I give my informed consent for the services to be provided through the use of telemedicine in my medical care  By participating in this telemedicine visit I agree to the above.

## 2019-03-28 ENCOUNTER — Other Ambulatory Visit: Payer: Self-pay | Admitting: Critical Care Medicine

## 2019-03-31 NOTE — Progress Notes (Signed)
Virtual Visit via Telephone Note   This visit type was conducted due to national recommendations for restrictions regarding the COVID-19 Pandemic (e.g. social distancing) in an effort to limit this patient's exposure and mitigate transmission in our community.  Due to her co-morbid illnesses, this patient is at least at moderate risk for complications without adequate follow up.  This format is felt to be most appropriate for this patient at this time.  The patient did not have access to video technology/had technical difficulties with video requiring transitioning to audio format only (telephone).  All issues noted in this document were discussed and addressed.  No physical exam could be performed with this format.  Please refer to the patient's chart for her  consent to telehealth for [facility].   Evaluation Performed:  Follow-up visit  Date:  04/01/2019   ID:  Tracy, James 13-Jan-1951, MRN [id number]  Patient Location: Home Provider Location: Home  PCP:  Tracy Blackbird, MD  Cardiologist:  Sherren Mocha, MD   Electrophysiologist:  None   Chief Complaint: Follow-up on heart failure, atrial fibrillation  History of Present Illness:    Tracy James is a 68 y.o. female with persistent AFib with associated tachycardia induced cardiomyopathy and systolic HF,aortic stenosis, hypertension, tobacco use. Coronary CTA in August 2018 demonstrated a calcium score of 0 and no evidence of CAD.She was admitted in 11/2018 with AF with RVR. 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 Unfortunately, the TEE continued to show residual clot in the LA. Therefore, DCCV was not performed.  She was seen in follow up on 02/2019 and her HR was uncontrolled.  Unfortunately, repeat DCCV was not arranged due to COVID-19 restrictions (elective procedures  on hold).  She was therefore placed on Digoxin to help with rate control.  She was last seen 02/28/2019.  Her HR was much slower and I decreased her dose of Digoxin.    Today, she notes she is doing well.  She has not had significant shortness of breath.  She has not had chest pain, orthopnea, leg swelling.  She has not had any bleeding issues.  She does not check her blood pressure on a regular basis.  The patient does not have symptoms concerning for COVID-19 infection (fever, chills, cough, or new shortness of breath).    Past Medical History:  Diagnosis Date   Allergy    SEASONAL   Heart murmur    Hypertension    Past Surgical History:  Procedure Laterality Date   TEE WITHOUT CARDIOVERSION N/A 12/06/2018   Procedure: TRANSESOPHAGEAL ECHOCARDIOGRAM (TEE);  Surgeon: Lelon Perla, MD;  Location: [facility] ENDOSCOPY;  Service: Cardiovascular;  Laterality: N/A;   TEE WITHOUT CARDIOVERSION N/A 01/20/2019   Procedure: TRANSESOPHAGEAL ECHOCARDIOGRAM (TEE);  Surgeon: [name], MD;  Location[facility] ENDOSCOPY;  Service: Cardiovascular;  Laterality: N/A;   TONSILLECTOMY       Current Meds  Medication Sig   acetaminophen (TYLENOL) 500 MG tablet Take 500 mg by mouth every 6 (six) hours as needed (for pain.).   apixaban (ELIQUIS) 5 MG TABS tablet Take 1 tablet (5 mg total) by mouth every 12 (twelve) hours.   Calcium 600-200 MG-UNIT tablet Take 1 tablet by mouth daily.   carboxymethylcellulose (REFRESH PLUS) 0.5 % SOLN Place 1 drop into both eyes 3 (three) times daily as needed (watery eyes.).  digoxin (LANOXIN) 0.125 MG tablet Take 0.5 tablets (0.0625 mg total) by mouth every other day.   diltiazem (CARDIZEM CD) 300 MG 24 hr capsule Take 1 capsule (300 mg total) by mouth daily.   furosemide (LASIX) 40 MG tablet Take 1 tablet (40 mg total) by mouth daily.   metoprolol tartrate (LOPRESSOR) 50 MG tablet Take 2 tablets (100 mg total) by mouth 2 (two) times daily.   nitroGLYCERIN  (NITROSTAT) 0.4 MG SL tablet Place 1 tablet (0.4 mg total) under the tongue every 5 (five) minutes as needed for chest pain.   Omega-3 1000 MG CAPS Take 1,000 mg by mouth daily.      Allergies:   Patient has no known allergies.   Social History   Tobacco Use   Smoking status: Former Smoker    Last attempt to quit: 12/05/2011    Years since quitting: 7.3   Smokeless tobacco: Never Used  Substance Use Topics   Alcohol use: Yes    Comment: occasionally   Drug use: No     Family Hx: The patient's family history includes Arthritis in her maternal grandmother and mother; Asthma in her brother; Cancer in her brother and mother; Diabetes in her brother and mother; Early death in her brother; Heart disease in her father, maternal grandmother, and paternal grandmother; Heart murmur in her brother, brother, and mother; Hypertension in her brother and mother.  ROS:   Please see the history of present illness.    All other systems reviewed and are negative.   Prior CV studies:   The following studies were reviewed today:  TEE 01/20/2019 EF 30-35, atrial level shunting, small fixed LA thrombus on interior of LAA, no RAA clot   Transesophageal echocardiogram 01/02/2019 EF 30-35, diffuse HK, AV cusp separation severely reduced, mild AI, mild MR, moderate LAE, positive LAA clot, severely reduced RV SF, moderate RAE with positive clot in the atrial cavity, small pericardial effusion, poss thrombus in pericardial space   Echo 12/04/2018 Mild LVH, EF 40-45, diffuse HK, mild to moderate aortic stenosis (mean 19, peak 30), moderate MR, mild LAE, moderately reduced RV SF, PASP 28   CXR 12/03/18 IMPRESSION: 1. Diffuse peribronchial cuffing concerning for bronchitis, with probable left lower lobe bronchopneumonia. 2. Trace bilateral pleural effusions. 3. Aortic atherosclerosis.   Carotid US 10/10/2018 Summary: Right Carotid: There is no evidence of stenosis in the right ICA. Left Carotid: There  is no evidence of stenosis in the left ICA. Vertebrals:  Bilateral vertebral arteries demonstrate antegrade flow. Subclavians: Normal flow hemodynamics were seen in bilateral subclavian              arteries.   Echo 08/01/17 Mod LVH, EF 60-65, no RWMA, Gr 2 DD, mod AS (mean 35, peak 60), LVOT/AV velocity ratio 0.21, trivial MR, mild LAE   Coronary CTA 07/25/17 IMPRESSION: 1. Coronary calcium score of 0. This was 0 percentile for age and sex matched control. 2. Normal coronary origin with right dominance. 3. No evidence of CAD. 4. Unusually thickened leaflets of the tricuspid aortic valve mild calcifications in the non-coronary cusp. Leaflets opening can't be evaluated as only diastolic images were obtained. An echocardiogram is recommended for further evaluation.   Chest CTA 07/19/17 IMPRESSION: 1. No thoracic aortic aneurysm or dissection. There are foci of calcification in the thoracic aorta and great vessels. There is mild calcification in the aortic valve. No evident pulmonary embolus. 2. There is a degree of centrilobular emphysematous change. No edema or consolidation. There is  bibasilar atelectasis.  3. Single mildly prominent pretracheal lymph node, likely reactive in etiology. Aortic Atherosclerosis (ICD10-I70.0) and Emphysema (ICD10-J43.9).   Carotid US 12/14 There is no evidence of a hemodynamically significant carotid stenosis.    Labs/Other Tests and Data Reviewed:    EKG:  No ECG reviewed.  Recent Labs: 10/04/2018: ALT 11 12/03/2018: Magnesium 1.8; TSH 0.991 01/17/2019: Hemoglobin 15.1; Platelets 326 02/28/2019: BUN 23; Creatinine, Ser 1.41; Potassium 4.8; Sodium 141   Recent Lipid Panel Lab Results  Component Value Date/Time   CHOL 139 01/15/2019 10:49 AM   TRIG 147 01/15/2019 10:49 AM   HDL 40 01/15/2019 10:49 AM   CHOLHDL 3.5 01/15/2019 10:49 AM   CHOLHDL 3.2 09/26/2017 09:10 AM   LDLCALC 70 01/15/2019 10:49 AM   LDLCALC 95 09/26/2017 09:10 AM    Wt Readings  from Last 3 Encounters:  04/01/19 170 lb (77.1 kg)  02/28/19 169 lb 12.8 oz (77 kg)  02/19/19 169 lb 12.8 oz (77 kg)     Objective:    Vital Signs:  BP (!) 160/97    Pulse 78    Ht 5\' 6"  (1.676 m)    Wt 170 lb (77.1 kg)    BMI 27.44 kg/m    VITAL SIGNS:  reviewed GEN:  no acute distress RESPIRATORY:  No labored breathing noted during our conversation NEURO:  Alert and oriented PSYCH:  She seems to be in good spirits  ASSESSMENT & PLAN:    Persistent atrial fibrillation Overall, stable.  Heart rate by her check seems to be well controlled.  Ultimately, she will need admission to the hospital for Dofetilide with a TEE prior to this.  There are no elective admissions currently due to COVID-19.  I will arrange follow-up in 4 to 6 weeks on a day that Dr. Lovena Le is in the office.  If we are not seeing patients in the office at that point, plan follow-up telehealth visit.  Of note, her daughter is having a baby in the next few weeks.  Therefore, she may be in [place] for an extended period of time and need to push out her appointment further.  Chronic systolic CHF (congestive heart failure) (HCC) EF 30-25.  NYHA 2.  Volume seems to be stable.  I did plan to place her on ARB with losartan for management of congestive heart failure.  However, she is planning to go to [place] in a few days to be with her daughter.  With her chronic kidney disease, I would want to obtain a follow-up BMET within a week.  Therefore, I have decided to place her on hydralazine instead.  -Continue beta-blocker  -Start hydralazine as noted  CKD (chronic kidney disease) stage 3, GFR 30-59 ml/min (HCC) Stable on recent labs.  Essential hypertension Blood pressure uncontrolled.  Start hydralazine 25 mg 3 times a day.  I have asked her to continue to monitor her blood pressure over the next couple weeks and send me those readings for review.  COVID-19 Education: The signs and symptoms of COVID-19 were discussed with  the patient and how to seek care for testing (follow up with PCP or arrange E-visit).  The importance of social distancing was discussed today.  Time:   Today, I have spent 18 minutes with the patient with telehealth technology discussing the above problems.     Medication Adjustments/Labs and Tests Ordered: Current medicines are reviewed at length with the patient today.  Concerns regarding medicines are outlined above.   Tests Ordered: No  orders of the defined types were placed in this encounter.   Medication Changes: Meds ordered this encounter  Medications   metoprolol tartrate (LOPRESSOR) 50 MG tablet    Sig: Take 2 tablets (100 mg total) by mouth 2 (two) times daily.    Dispense:  360 tablet    Refill:  3    Order Specific Question:   Supervising Provider    Answer:   Lelon Perla [[id number]]   hydrALAZINE (APRESOLINE) 25 MG tablet    Sig: Take 1 tablet (25 mg total) by mouth 3 (three) times daily.    Dispense:  90 tablet    Refill:  11    Order Specific Question:   Supervising Provider    Answer:   Lelon Perla [[id number]]    Disposition:  Follow up in 6 week(s)  Signed, Richardson Dopp, PA-C  04/01/2019 9:42 AM    [place]

## 2019-04-01 ENCOUNTER — Telehealth: Payer: Self-pay | Admitting: Physician Assistant

## 2019-04-01 ENCOUNTER — Other Ambulatory Visit: Payer: Self-pay

## 2019-04-01 ENCOUNTER — Telehealth (INDEPENDENT_AMBULATORY_CARE_PROVIDER_SITE_OTHER): Payer: Medicare Other | Admitting: Physician Assistant

## 2019-04-01 ENCOUNTER — Encounter: Payer: Self-pay | Admitting: Physician Assistant

## 2019-04-01 VITALS — BP 160/97 | HR 78 | Ht 66.0 in | Wt 170.0 lb

## 2019-04-01 DIAGNOSIS — I10 Essential (primary) hypertension: Secondary | ICD-10-CM

## 2019-04-01 DIAGNOSIS — N183 Chronic kidney disease, stage 3 unspecified: Secondary | ICD-10-CM

## 2019-04-01 DIAGNOSIS — Z7189 Other specified counseling: Secondary | ICD-10-CM

## 2019-04-01 DIAGNOSIS — I4819 Other persistent atrial fibrillation: Secondary | ICD-10-CM | POA: Diagnosis not present

## 2019-04-01 DIAGNOSIS — I5022 Chronic systolic (congestive) heart failure: Secondary | ICD-10-CM

## 2019-04-01 MED ORDER — METOPROLOL TARTRATE 50 MG PO TABS: 100.0000 mg | ORAL_TABLET | Freq: Two times a day (BID) | ORAL | 3 refills | Status: DC

## 2019-04-01 MED ORDER — HYDRALAZINE HCL 25 MG PO TABS: 25.0000 mg | ORAL_TABLET | Freq: Three times a day (TID) | ORAL | 11 refills | Status: DC

## 2019-04-01 NOTE — Patient Instructions (Signed)
Medication Instructions:  Start Hydralazine 25 mg three times a day for blood pressure  A prescription has been sent to your pharmacy.  If you need a refill on your cardiac medications before your next appointment, please call your pharmacy.   Lab work: None   If you have labs (blood work) drawn today and your tests are completely normal, you will receive your results only by: Marland Kitchen MyChart Message (if you have MyChart) OR . A paper copy in the mail If you have any lab test that is abnormal or we need to change your treatment, we will call you to review the results.  Testing/Procedures: None   Follow-Up: At [facility], you and your health needs are our priority.  As part of our continuing mission to provide you with exceptional heart care, we have created designated Provider Care Teams.  These Care Teams include your primary Cardiologist (physician) and Advanced Practice Providers (APPs -  Physician Assistants and Nurse Practitioners) who all work together to provide you with the care you need, when you need it. Richardson Dopp, PA-C in 4-6 weeks  Any Other Special Instructions Will Be Listed Below (If Applicable).  Check BP 1-2 times a day for 2 weeks and send readings to me for review.

## 2019-04-01 NOTE — Telephone Encounter (Signed)
° °  Patient wants to know what medication to take for sinus issues that will not elevate BP

## 2019-04-10 ENCOUNTER — Telehealth: Payer: Self-pay | Admitting: Physician Assistant

## 2019-04-10 NOTE — Telephone Encounter (Signed)
New Message             Patient is calling to give B/P reading   05/01 120/80 05/04  113/82 05/05  129/73 05/06/ 106/72  Patient also states the "Hydralazine" is turning her urine yellow. Patient is wondering if she should come in for lab work

## 2019-04-10 NOTE — Telephone Encounter (Signed)
LMTCB

## 2019-04-10 NOTE — Telephone Encounter (Signed)
LMTCB # 2

## 2019-04-11 NOTE — Telephone Encounter (Signed)
I spoke to patient who said there is no burning or other unusual changes in her urine pattern.  She goes frequently because of the Lasix, no changes there.  She will continue to monitor over the weekend and may need to reach out to PCP.

## 2019-04-11 NOTE — Telephone Encounter (Signed)
BP looks great!  Urine discoloration is not a common side effect with hydralazine. Is she having any burning, frequency or decreased amount of urine?  Richardson Dopp, PA-C    04/11/2019 10:20 AM

## 2019-04-11 NOTE — Telephone Encounter (Signed)
I spoke to the patient who was recently started on hydralazine for her BP.  We discussed how nice her BP readings have been, but her concern was the color of her urine.    She said that since she started the medication, she has had the same amount of fluid intake, maybe ruling out dehydration, but her urine is darker than usual.  She said, "I am not going to say that it is real dark, or near brown, just darker than usual."  She said that she called because she wanted to bring it to someone's attention and give BP readings.

## 2019-04-11 NOTE — Telephone Encounter (Signed)
I spoke to the patient with Scott's recommendations/suggestions.  She verbalized understanding and will keep [place] updated with her status.

## 2019-04-11 NOTE — Telephone Encounter (Signed)
Agree.  Continue to monitor.  Touch base with her PCP if it continues. Call us if she develops a rash or joint pain. If this continues we could try changing Hydralazine to something else to see if it makes a difference.  But, I would not do that until she has followed up with primary care to rule out other causes.   Richardson Dopp, PA-C    04/11/2019 1:27 PM

## 2019-05-02 ENCOUNTER — Telehealth: Payer: Self-pay | Admitting: Physician Assistant

## 2019-05-02 NOTE — Telephone Encounter (Signed)
She has an appt with me 6/16.  I am in the office on 6/24 for flex clinic.  Her primary cardiologist, Dr. Burt Knack, is DOD on that day.  If she is able to change, let's put her on my schedule on 6/24 for an in person visit. Richardson Dopp, PA-C    05/02/2019 8:53 AM

## 2019-05-06 NOTE — Telephone Encounter (Signed)
Attempted to call pt to reschedule. No vm set up phone just hangs up.

## 2019-05-07 NOTE — Telephone Encounter (Signed)
Attempted to call pt to reschedule. No vm set up phone just beeps.

## 2019-05-19 DIAGNOSIS — N1831 Chronic kidney disease, stage 3a: Secondary | ICD-10-CM | POA: Insufficient documentation

## 2019-05-19 DIAGNOSIS — N1832 Chronic kidney disease, stage 3b: Secondary | ICD-10-CM | POA: Insufficient documentation

## 2019-05-19 NOTE — Progress Notes (Signed)
Virtual Visit via Telephone Note   This visit type was conducted due to national recommendations for restrictions regarding the COVID-19 Pandemic (e.g. social distancing) in an effort to limit this patient's exposure and mitigate transmission in our community.  Due to her co-morbid illnesses, this patient is at least at moderate risk for complications without adequate follow up.  This format is felt to be most appropriate for this patient at this time.  The patient did not have access to video technology/had technical difficulties with video requiring transitioning to audio format only (telephone).  All issues noted in this document were discussed and addressed.  No physical exam could be performed with this format.  Please refer to the patient's chart for her  consent to telehealth for [facility].   Date:  05/20/2019   ID:  Tracy, James 04/22/1951, MRN [id number]  Patient Location: Home Provider Location: Home  PCP:  Antony Blackbird, MD  Cardiologist:  Sherren Mocha, MD   Electrophysiologist:  None   Evaluation Performed:  Follow-Up Visit  Chief Complaint:  FU on CHF, AFib  History of Present Illness:    Tracy James is a 68 y.o. 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  Her daughter was close to giving birth.  Hydralazine was added at that visit due to uncontrolled BP.  We planned follow up in June with the hopes that some of the COVID restrictions would be lifted so that we could arrange  elective admission for Dofetilide load.    Today, she notes she is doing well.  Her daughter had her baby last month.  She has been helping her out.  She came back to [place] recently.  However, she plans to go back to [employer] to help out with the baby soon.  She has not had any chest pain, significant shortness of breath, orthopnea, leg swelling.  She has not had any bleeding issues.  The patient does not have symptoms concerning for COVID-19 infection (fever, chills, cough, or new shortness of breath).    Past Medical History:  Diagnosis Date  . Allergy    SEASONAL  . Heart murmur   . Hypertension    Past Surgical History:  Procedure Laterality Date  . TEE WITHOUT CARDIOVERSION N/A 12/06/2018   Procedure: TRANSESOPHAGEAL ECHOCARDIOGRAM (TEE);  Surgeon: Lelon Perla, MD;  Location: [facility] ENDOSCOPY;  Service: Cardiovascular;  Laterality: N/A;  . TEE WITHOUT CARDIOVERSION N/A 01/20/2019   Procedure: TRANSESOPHAGEAL ECHOCARDIOGRAM (TEE);  Surgeon: [name], MD;  Location: [facility] ENDOSCOPY;  Service: Cardiovascular;  Laterality: N/A;  . TONSILLECTOMY       Current Meds  Medication Sig  . acetaminophen (TYLENOL) 500 MG tablet Take 500 mg by mouth every 6 (six) hours as needed (for pain.).  Marland Kitchen apixaban (ELIQUIS) 5 MG TABS tablet Take 1 tablet (5 mg total) by mouth every 12 (twelve) hours.  . Calcium 600-200 MG-UNIT tablet Take 1 tablet by mouth daily.  . digoxin (  LANOXIN) 0.125 MG tablet Take 0.5 tablets (0.0625 mg total) by mouth every other day.  . diltiazem (CARDIZEM CD) 300 MG 24 hr capsule Take 1 capsule (300 mg total) by mouth daily.  . furosemide (LASIX) 40 MG tablet Take 1 tablet (40 mg total) by mouth daily.  . hydrALAZINE (APRESOLINE) 25 MG tablet Take 1 tablet (25 mg total) by mouth 3 (three) times daily.  Marland Kitchen loratadine (CLARITIN) 10 MG tablet Take 10 mg by mouth daily as needed for allergies.  . metoprolol tartrate (LOPRESSOR) 50 MG tablet Take 2 tablets (100 mg total)  by mouth 2 (two) times daily.  . nitroGLYCERIN (NITROSTAT) 0.4 MG SL tablet Place 1 tablet (0.4 mg total) under the tongue every 5 (five) minutes as needed for chest pain.  . Omega-3 1000 MG CAPS Take 1,000 mg by mouth daily.   Marland Kitchen VITAMIN D PO Take 1 tablet by mouth daily.     Allergies:   Patient has no known allergies.   Social History   Tobacco Use  . Smoking status: Former Smoker    Quit date: 12/05/2011    Years since quitting: 7.4  . Smokeless tobacco: Never Used  Substance Use Topics  . Alcohol use: Yes    Comment: occasionally  . Drug use: No     Family Hx: The patient's family history includes Arthritis in her maternal grandmother and mother; Asthma in her brother; Cancer in her brother and mother; Diabetes in her brother and mother; Early death in her brother; Heart disease in her father, maternal grandmother, and paternal grandmother; Heart murmur in her brother, brother, and mother; Hypertension in her brother and mother.  ROS:   Please see the history of present illness.     All other systems reviewed and are negative.   Prior CV studies:   The following studies were reviewed today:   TEE 01/20/2019 EF 30-35, atrial level shunting, small fixed LA thrombus on interior of LAA, no RAA clot   Transesophageal echocardiogram 01/02/2019 EF 30-35, diffuse HK, AV cusp separation severely reduced, mild AI, mild MR, moderate LAE, positive LAA clot, severely reduced RV SF, moderate RAE with positive clot in the atrial cavity, small pericardial effusion, poss thrombus in pericardial space   Echo 12/04/2018 Mild LVH, EF 40-45, diffuse HK, mild to moderate aortic stenosis (mean 19, peak 30), moderate MR, mild LAE, moderately reduced RV SF, PASP 28   CXR 12/03/18 IMPRESSION: 1. Diffuse peribronchial cuffing concerning for bronchitis, with probable left lower lobe bronchopneumonia. 2. Trace bilateral pleural effusions. 3. Aortic atherosclerosis.   Carotid US 10/10/2018 Summary:  Right Carotid: There is no evidence of stenosis in the right ICA. Left Carotid: There is no evidence of stenosis in the left ICA. Vertebrals:  Bilateral vertebral arteries demonstrate antegrade flow. Subclavians: Normal flow hemodynamics were seen in bilateral subclavian              arteries.   Echo 08/01/17 Mod LVH, EF 60-65, no RWMA, Gr 2 DD, mod AS (mean 35, peak 60), LVOT/AV velocity ratio 0.21, trivial MR, mild LAE   Coronary CTA 07/25/17 IMPRESSION: 1. Coronary calcium score of 0. This was 0 percentile for age and sex matched control. 2. Normal coronary origin with right dominance. 3. No evidence of CAD. 4. Unusually thickened leaflets of the tricuspid aortic valve mild calcifications in the non-coronary cusp. Leaflets opening can't be evaluated as only diastolic images were obtained. An echocardiogram is recommended for further evaluation.   Chest CTA 07/19/17 IMPRESSION:  1. No thoracic aortic aneurysm or dissection. There are foci of calcification in the thoracic aorta and great vessels. There is mild calcification in the aortic valve. No evident pulmonary embolus. 2. There is a degree of centrilobular emphysematous change. No edema or consolidation. There is bibasilar atelectasis.  3. Single mildly prominent pretracheal lymph node, likely reactive in etiology. Aortic Atherosclerosis (ICD10-I70.0) and Emphysema (ICD10-J43.9).   Carotid US 12/14 There is no evidence of a hemodynamically significant carotid stenosis.     Labs/Other Tests and Data Reviewed:    EKG:  No ECG reviewed.  Recent Labs: 10/04/2018: ALT 11 12/03/2018: Magnesium 1.8; TSH 0.991 01/17/2019: Hemoglobin 15.1; Platelets 326 02/28/2019: BUN 23; Creatinine, Ser 1.41; Potassium 4.8; Sodium 141   Recent Lipid Panel Lab Results  Component Value Date/Time   CHOL 139 01/15/2019 10:49 AM   TRIG 147 01/15/2019 10:49 AM   HDL 40 01/15/2019 10:49 AM   CHOLHDL 3.5 01/15/2019 10:49 AM   CHOLHDL 3.2 09/26/2017 09:10  AM   LDLCALC 70 01/15/2019 10:49 AM   LDLCALC 95 09/26/2017 09:10 AM    Wt Readings from Last 3 Encounters:  05/20/19 173 lb (78.5 kg)  04/01/19 170 lb (77.1 kg)  02/28/19 169 lb 12.8 oz (77 kg)     Objective:    Vital Signs:  BP 139/79   Pulse 88   Ht 5\' 6"  (1.676 m)   Wt 173 lb (78.5 kg)   BMI 27.92 kg/m    VITAL SIGNS:  reviewed GEN:  no acute distress RESPIRATORY:  No labored breathing NEURO:  Alert and oriented PSYCH:  She seems to be in a good mood  ASSESSMENT & PLAN:     Persistent atrial fibrillation - Plan: She seems to be doing well on her current regimen.  She has been taking Apixaban as prescribed without interruption.  As COVID restrictions have been eased somewhat, I believe that we could possibly plan on proceeding with admission to the hospital for dofetilide load.  I will review this first with Dr. Burt Knack.  Continue current dose of Apixaban, digoxin, diltiazem, metoprolol tartrate.  Chronic systolic CHF (congestive heart failure) ([place]) - Plan: EF 30-35 by TEE in [place] 2020.  NYHA 2.  Volume status seems to be stable.  Diltiazem has been used for rate control.  Therefore, this has been continued.  She was unable to get follow-up labs.  Therefore, I did not place her on ACE inhibitor or ARB.  Continue beta-blocker, hydralazine.  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  She has been on uninterrupted anticoagulation since February when she had her last TEE.  I will review this with Dr. Burt Knack as well.  Essential hypertension - Plan: Fair control.  Continue current management.  CKD (chronic kidney disease) stage 3, GFR 30-59 ml/min (HCC) - Plan: Most recent creatinine was stable.  COVID-19 Education: The signs and symptoms of COVID-19 were discussed with the patient and how to seek care for  testing (follow up with PCP or arrange E-visit).  The importance of social distancing was discussed today.  Time:   Today, I have spent 19 minutes with the patient with telehealth technology discussing the above problems.     Medication Adjustments/Labs and Tests Ordered: Current medicines are reviewed at length with the patient today.  Concerns regarding medicines are outlined above.   Tests Ordered: No orders  of the defined types were placed in this encounter.   Medication Changes: No orders of the defined types were placed in this encounter.   Follow Up:   Will decide on follow up based upon +/- admit for Dofetilide   Signed, Richardson Dopp, PA-C  05/20/2019 7:58 PM    [facility]

## 2019-05-20 ENCOUNTER — Encounter: Payer: Self-pay | Admitting: Physician Assistant

## 2019-05-20 ENCOUNTER — Other Ambulatory Visit: Payer: Self-pay

## 2019-05-20 ENCOUNTER — Telehealth: Payer: Self-pay

## 2019-05-20 ENCOUNTER — Telehealth (INDEPENDENT_AMBULATORY_CARE_PROVIDER_SITE_OTHER): Payer: Medicare Other | Admitting: Physician Assistant

## 2019-05-20 VITALS — BP 139/79 | HR 88 | Ht 66.0 in | Wt 173.0 lb

## 2019-05-20 DIAGNOSIS — N183 Chronic kidney disease, stage 3 unspecified: Secondary | ICD-10-CM

## 2019-05-20 DIAGNOSIS — I5022 Chronic systolic (congestive) heart failure: Secondary | ICD-10-CM

## 2019-05-20 DIAGNOSIS — I4819 Other persistent atrial fibrillation: Secondary | ICD-10-CM

## 2019-05-20 DIAGNOSIS — I10 Essential (primary) hypertension: Secondary | ICD-10-CM

## 2019-05-20 DIAGNOSIS — I513 Intracardiac thrombosis, not elsewhere classified: Secondary | ICD-10-CM

## 2019-05-20 DIAGNOSIS — Z7189 Other specified counseling: Secondary | ICD-10-CM

## 2019-05-20 NOTE — Telephone Encounter (Signed)

## 2019-05-21 ENCOUNTER — Telehealth: Payer: Self-pay | Admitting: Physician Assistant

## 2019-05-21 NOTE — Telephone Encounter (Incomplete)
New Message             Patient is calling back to confirm that 2 weeks is ok to set the date for surgery. Patient states she spoke to [employer] on last night and they both agree to do 2 weeks to set up surgery.`

## 2019-05-27 ENCOUNTER — Telehealth: Payer: Self-pay | Admitting: Physician Assistant

## 2019-05-27 DIAGNOSIS — I5022 Chronic systolic (congestive) heart failure: Secondary | ICD-10-CM

## 2019-05-27 NOTE — Telephone Encounter (Signed)
Reviewed case with Dr. Burt Knack today. Please call Ms. Beal and let her know that we can start the process to get her set up for Tikosyn. She will need an echocardiogram and a referral to the AFib Clinic.  PLAN:  1. Schedule a 2D-Echocardiogram (Dx:  Chronic Systolic CHF). 2. Refer to [name], NP at the AFib Clinic to arrange [place] admission. Richardson Dopp, PA-C    05/27/2019 10:36 AM

## 2019-05-27 NOTE — Telephone Encounter (Signed)
Lvm on patient phone to call back about Echo and referrall to Afib Clinic

## 2019-05-28 NOTE — Telephone Encounter (Signed)
Lvm on patient phone to call back about Echo and referrall to Afib Clinic

## 2019-05-29 NOTE — Telephone Encounter (Signed)
Yes she needs the transthoracic Echo before her Tikosyn admit. I spoke to Dr. Burt Knack and we felt that since her last TEE was in Feb and she has been on uninterrupted anticoagulation since that time, she does not need to repeat the TEE. So, she just needs a repeat 2D Echo.  Thanks! Richardson Dopp, PA-C    05/29/2019 9:51 AM

## 2019-06-01 ENCOUNTER — Other Ambulatory Visit: Payer: Self-pay | Admitting: Critical Care Medicine

## 2019-06-03 ENCOUNTER — Encounter (HOSPITAL_COMMUNITY): Payer: Self-pay | Admitting: Physician Assistant

## 2019-06-03 ENCOUNTER — Other Ambulatory Visit: Payer: Self-pay | Admitting: Physician Assistant

## 2019-06-03 NOTE — Telephone Encounter (Signed)
Please advise on refill request as Richardson Dopp has never filled these for the patient. Thanks, MI

## 2019-06-03 NOTE — Telephone Encounter (Signed)
Please make sure this patient is getting a referral to the AFib Clinic for Tikosyn. Richardson Dopp, PA-C    06/03/2019 8:58 PM

## 2019-06-03 NOTE — Telephone Encounter (Signed)
New Message     *STAT* If patient is at the pharmacy, call can be transferred to refill team.   1. Which medications need to be refilled? (please list name of each medication and dose if known) apixaban (ELIQUIS) 5 MG TABS tablet and diltiazem (CARDIZEM CD) 300 MG 24 hr capsule    2. Which pharmacy/location (including street and city if local pharmacy) is medication to be sent to? [place], [place]  3. Do they need a 30 day or 90 day supply? [place]

## 2019-06-04 ENCOUNTER — Telehealth: Payer: Self-pay | Admitting: Pharmacist

## 2019-06-04 ENCOUNTER — Other Ambulatory Visit: Payer: Self-pay | Admitting: *Deleted

## 2019-06-04 DIAGNOSIS — I4819 Other persistent atrial fibrillation: Secondary | ICD-10-CM

## 2019-06-04 MED ORDER — DILTIAZEM HCL ER COATED BEADS 300 MG PO CP24: 300.0000 mg | ORAL_CAPSULE | Freq: Every day | ORAL | 3 refills | Status: DC

## 2019-06-04 MED ORDER — APIXABAN 5 MG PO TABS: 5.0000 mg | ORAL_TABLET | Freq: Two times a day (BID) | ORAL | 5 refills | Status: DC

## 2019-06-04 NOTE — Telephone Encounter (Signed)
Please address Cardizem 300mg  24hr capsule refill per original request on 06/03/2019 at 926am. Thanks.

## 2019-06-04 NOTE — Telephone Encounter (Signed)
Ok to fill Cardizem. Richardson Dopp, PA-C    06/04/2019 12:46 PM

## 2019-06-04 NOTE — Telephone Encounter (Signed)
Pt's medication was sent to pt's pharmacy as requested. Confirmation received.  °

## 2019-06-04 NOTE — Addendum Note (Signed)
Addended by: Derl Barrow on: 06/04/2019 12:50 PM   Modules accepted: Orders

## 2019-06-04 NOTE — Telephone Encounter (Addendum)
Eliquis 5mg  refill request received; pt is 68 yrs old, wt-77kg, Crea-1.41 on 02/28/2019, last seen by Richardson Dopp on 05/20/2019, will send in refill to requested pharmacy, which states [facility] in [place] per typed out request.   Will send Cardizem refill to Refill Dept.

## 2019-06-04 NOTE — Telephone Encounter (Signed)
Lvm on patient phone to call back about Echo and referrall to Afib Clinic

## 2019-06-04 NOTE — Telephone Encounter (Signed)
Medication list reviewed in anticipation of upcoming Tikosyn initiation. Patient is not taking any contraindicated or QTc prolonging medications.   Patient is anticoagulated on Eliquis 5mg  BID on the appropriate dose. Please ensure that patient has not missed any anticoagulation doses in the 3 weeks prior to Tikosyn initiation.   Pt with hx of prior history of LA thrombus in December 2019 and February 2020, has been on uninterrupted anticoagulation since February - would clarify with MD whether TEE will be needed again prior to Tikosyn start.  Patient will need to be counseled to avoid use of Benadryl while on Tikosyn and in the 2-3 days prior to Tikosyn initiation.

## 2019-06-04 NOTE — Telephone Encounter (Signed)
Pt requesting a refill on diltiazem 300 mg tablet. This medication has been refilled with pt's PCP, would Richardson Dopp, PA like to refill this medication? Please address

## 2019-06-09 ENCOUNTER — Encounter (HOSPITAL_COMMUNITY): Payer: Self-pay | Admitting: *Deleted

## 2019-06-09 NOTE — Telephone Encounter (Signed)
Patient missed dose of eliquis last week unsure of what day. She ran out for 2 days. Admission pushed out until August 11th. Pt re-educated on importance of no missed doses of anticoagulation. Pt will check on the price of the drug. Will have echo done once back in [place].

## 2019-06-09 NOTE — Telephone Encounter (Signed)
Per Dr. Rayann Heman - pt does not need repeat TEE prior to admission if 3 weeks of uninterrupted therapy. Will attempt to schedule with pt.

## 2019-06-09 NOTE — Telephone Encounter (Signed)
Left message to set up tikosyn admit

## 2019-06-10 ENCOUNTER — Other Ambulatory Visit (HOSPITAL_COMMUNITY): Payer: Self-pay | Admitting: *Deleted

## 2019-06-18 ENCOUNTER — Ambulatory Visit (HOSPITAL_COMMUNITY): Payer: Medicare Other | Attending: Cardiology

## 2019-06-18 ENCOUNTER — Other Ambulatory Visit: Payer: Self-pay

## 2019-06-18 DIAGNOSIS — I5022 Chronic systolic (congestive) heart failure: Secondary | ICD-10-CM

## 2019-06-30 ENCOUNTER — Telehealth: Payer: Self-pay | Admitting: Physician Assistant

## 2019-06-30 NOTE — Telephone Encounter (Signed)
New message:    Patient calling stating that she  Has a test coming up on 07/15/19 and states she got in contact with her insurance company they told her she need to contact the doctor's office, also they sent over a form on 06/19/19. Please call patient.

## 2019-06-30 NOTE — Telephone Encounter (Signed)
Left the pt a message to call the office back and ask for a triage nurse for further assistance, on message left earlier.  It appears on 8/11 the pt will see Afib clinic for tikosyn prep. This call may need to be referred to them for further assistance, but will need better information obtained.

## 2019-07-04 NOTE — Telephone Encounter (Signed)
Referral to A fib clinic was made by Richardson Dopp and there is not a form in his mail box at the office

## 2019-07-04 NOTE — Telephone Encounter (Signed)
I have not spoken with pt about this - med list was just reviewed prior to [place] admission. Afib clinic likely knows more about this.

## 2019-07-04 NOTE — Telephone Encounter (Signed)
Spoke to patient who states her insurance company is waiting on a form from our office in order for her to be able to start the [place]. She states she has been told the Tikosyn will cost her $60+ per month and she is not willing to pay this because she is already paying that amount for Eliquis each month. She states [employer] said they sent the form over a week ago and have not received anything back from [place]. She states that this form will allow her to pay less for the Tikosyn, otherwise she states the admission needs to be cancelled.  I advised that we will try to locate the form and that I will forward message to [place], [facility] whom she spoke with on 7/1 about this admission and to A Fib clinic. I advised patient someone from our office will call her back with more information. She thanked me for the call.

## 2019-07-07 NOTE — Telephone Encounter (Signed)
I have left a message for patient to return my call to further discuss

## 2019-07-09 NOTE — Telephone Encounter (Signed)
Tier exception was denied. I have started the appeal process. Will await result.   30 day supply of dofetilide is $60 a month 90 day supply is $128.00

## 2019-07-11 ENCOUNTER — Other Ambulatory Visit (HOSPITAL_COMMUNITY)
Admission: RE | Admit: 2019-07-11 | Discharge: 2019-07-11 | Disposition: A | Discharge status: Home / Self Care | Payer: Medicare Other | Source: Ambulatory Visit | Attending: Physician Assistant | Admitting: Physician Assistant

## 2019-07-11 DIAGNOSIS — Z20828 Contact with and (suspected) exposure to other viral communicable diseases: Secondary | ICD-10-CM | POA: Insufficient documentation

## 2019-07-11 DIAGNOSIS — Z01812 Encounter for preprocedural laboratory examination: Secondary | ICD-10-CM | POA: Insufficient documentation

## 2019-07-11 LAB — SARS CORONAVIRUS 2 (TAT 6-24 HRS): SARS Coronavirus 2: NEGATIVE

## 2019-07-14 ENCOUNTER — Telehealth: Payer: Self-pay | Admitting: Cardiovascular Disease

## 2019-07-14 NOTE — Telephone Encounter (Signed)
New message   Pt c/o medication issue:  1. Name of Medication: Tikosyn  2. How are you currently taking this medication (dosage and times per day)?n/a   3. Are you having a reaction (difficulty breathing--STAT)? n/a  4. What is your medication issue? This appeal is due soon. Please answer questions from the clinical questionnaire that is going to be faxed to [phone] per [facility].

## 2019-07-15 ENCOUNTER — Ambulatory Visit (HOSPITAL_COMMUNITY)
Admission: RE | Admit: 2019-07-15 | Discharge: 2019-07-15 | Disposition: A | Discharge status: Home / Self Care | Payer: Medicare Other | Source: Ambulatory Visit | Attending: Physician Assistant | Admitting: Physician Assistant

## 2019-07-15 ENCOUNTER — Other Ambulatory Visit: Payer: Self-pay

## 2019-07-15 ENCOUNTER — Encounter (HOSPITAL_COMMUNITY): Payer: Self-pay | Admitting: Physician Assistant

## 2019-07-15 ENCOUNTER — Inpatient Hospital Stay (HOSPITAL_COMMUNITY)
Admission: RE | Admit: 2019-07-15 | Discharge: 2019-07-18 | DRG: 309 | Disposition: A | Discharge status: Home / Self Care | Payer: Medicare Other | Source: Ambulatory Visit | Attending: Internal Medicine | Admitting: Internal Medicine

## 2019-07-15 VITALS — BP 124/62 | HR 101 | Ht 66.0 in | Wt 178.0 lb

## 2019-07-15 DIAGNOSIS — Z8249 Family history of ischemic heart disease and other diseases of the circulatory system: Secondary | ICD-10-CM | POA: Diagnosis not present

## 2019-07-15 DIAGNOSIS — I5022 Chronic systolic (congestive) heart failure: Secondary | ICD-10-CM | POA: Diagnosis present

## 2019-07-15 DIAGNOSIS — Z87891 Personal history of nicotine dependence: Secondary | ICD-10-CM

## 2019-07-15 DIAGNOSIS — J302 Other seasonal allergic rhinitis: Secondary | ICD-10-CM | POA: Diagnosis present

## 2019-07-15 DIAGNOSIS — I130 Hypertensive heart and chronic kidney disease with heart failure and stage 1 through stage 4 chronic kidney disease, or unspecified chronic kidney disease: Secondary | ICD-10-CM | POA: Diagnosis present

## 2019-07-15 DIAGNOSIS — Z20828 Contact with and (suspected) exposure to other viral communicable diseases: Secondary | ICD-10-CM | POA: Diagnosis present

## 2019-07-15 DIAGNOSIS — N183 Chronic kidney disease, stage 3 (moderate): Secondary | ICD-10-CM | POA: Diagnosis present

## 2019-07-15 DIAGNOSIS — I428 Other cardiomyopathies: Secondary | ICD-10-CM | POA: Diagnosis present

## 2019-07-15 DIAGNOSIS — I4819 Other persistent atrial fibrillation: Secondary | ICD-10-CM

## 2019-07-15 DIAGNOSIS — R0683 Snoring: Secondary | ICD-10-CM | POA: Diagnosis present

## 2019-07-15 LAB — BASIC METABOLIC PANEL
Anion gap: 10 (ref 5–15)
BUN: 31 mg/dL — ABNORMAL HIGH (ref 8–23)
CO2: 27 mmol/L (ref 22–32)
Calcium: 9.4 mg/dL (ref 8.9–10.3)
Chloride: 102 mmol/L (ref 98–111)
Creatinine, Ser: 1.7 mg/dL — ABNORMAL HIGH (ref 0.44–1.00)
GFR calc Af Amer: 36 mL/min — ABNORMAL LOW (ref >60)
GFR calc non Af Amer: 31 mL/min — ABNORMAL LOW (ref >60)
Glucose, Bld: 119 mg/dL — ABNORMAL HIGH (ref 70–99)
Potassium: 4.9 mmol/L (ref 3.5–5.1)
Sodium: 139 mmol/L (ref 135–145)

## 2019-07-15 LAB — MAGNESIUM: Magnesium: 2 mg/dL (ref 1.7–2.4)

## 2019-07-15 MED ORDER — SODIUM CHLORIDE 0.9 % IV SOLN: 250.0000 mL | INTRAVENOUS | Status: DC | PRN

## 2019-07-15 MED ORDER — HYDRALAZINE HCL 25 MG PO TABS
25.0000 mg | ORAL_TABLET | Freq: Three times a day (TID) | ORAL | Status: DC
  Administered 2019-07-15 – 2019-07-18 (×9): 25 mg via ORAL
  Filled 2019-07-15 (×9): qty 1

## 2019-07-15 MED ORDER — VITAMIN D 25 MCG (1000 UNIT) PO TABS
500.0000 [IU] | ORAL_TABLET | Freq: Every day | ORAL | Status: DC
  Administered 2019-07-16 – 2019-07-18 (×3): 500 [IU] via ORAL
  Filled 2019-07-15 (×3): qty 1

## 2019-07-15 MED ORDER — DILTIAZEM HCL ER COATED BEADS 180 MG PO CP24
300.0000 mg | ORAL_CAPSULE | Freq: Every day | ORAL | Status: DC
  Administered 2019-07-16 – 2019-07-18 (×3): 300 mg via ORAL
  Filled 2019-07-15 (×3): qty 1

## 2019-07-15 MED ORDER — DIGOXIN 125 MCG PO TABS
0.0625 mg | ORAL_TABLET | ORAL | Status: DC
  Administered 2019-07-17: 0.0625 mg via ORAL
  Filled 2019-07-15: qty 1

## 2019-07-15 MED ORDER — DOFETILIDE 250 MCG PO CAPS
250.0000 ug | ORAL_CAPSULE | Freq: Two times a day (BID) | ORAL | Status: DC
  Administered 2019-07-15 – 2019-07-18 (×6): 250 ug via ORAL
  Filled 2019-07-15 (×6): qty 1

## 2019-07-15 MED ORDER — LORATADINE 10 MG PO TABS
10.0000 mg | ORAL_TABLET | Freq: Every day | ORAL | Status: DC | PRN
  Filled 2019-07-15: qty 1

## 2019-07-15 MED ORDER — CALCIUM CARBONATE-VITAMIN D 500-200 MG-UNIT PO TABS
1.0000 | ORAL_TABLET | Freq: Every day | ORAL | Status: DC
  Administered 2019-07-16 – 2019-07-18 (×3): 1 via ORAL
  Filled 2019-07-15 (×3): qty 1

## 2019-07-15 MED ORDER — ACETAMINOPHEN 500 MG PO TABS
500.0000 mg | ORAL_TABLET | Freq: Four times a day (QID) | ORAL | Status: DC | PRN
  Administered 2019-07-17: 500 mg via ORAL
  Filled 2019-07-15: qty 1

## 2019-07-15 MED ORDER — OMEGA-3-ACID ETHYL ESTERS 1 G PO CAPS
1000.0000 mg | ORAL_CAPSULE | Freq: Every day | ORAL | Status: DC
  Administered 2019-07-16 – 2019-07-18 (×3): 1000 mg via ORAL
  Filled 2019-07-15 (×3): qty 1

## 2019-07-15 MED ORDER — METOPROLOL TARTRATE 100 MG PO TABS
100.0000 mg | ORAL_TABLET | Freq: Two times a day (BID) | ORAL | Status: DC
  Administered 2019-07-15 – 2019-07-18 (×6): 100 mg via ORAL
  Filled 2019-07-15 (×6): qty 1

## 2019-07-15 MED ORDER — SODIUM CHLORIDE 0.9% FLUSH
3.0000 mL | Freq: Two times a day (BID) | INTRAVENOUS | Status: DC
  Administered 2019-07-16 – 2019-07-18 (×4): 3 mL via INTRAVENOUS

## 2019-07-15 MED ORDER — SODIUM CHLORIDE 0.9% FLUSH: 3.0000 mL | INTRAVENOUS | Status: DC | PRN

## 2019-07-15 MED ORDER — APIXABAN 5 MG PO TABS
5.0000 mg | ORAL_TABLET | Freq: Two times a day (BID) | ORAL | Status: DC
  Administered 2019-07-15 – 2019-07-18 (×6): 5 mg via ORAL
  Filled 2019-07-15 (×6): qty 1

## 2019-07-15 MED ORDER — FUROSEMIDE 40 MG PO TABS
40.0000 mg | ORAL_TABLET | Freq: Every day | ORAL | Status: DC
  Administered 2019-07-16 – 2019-07-18 (×3): 40 mg via ORAL
  Filled 2019-07-15 (×3): qty 1

## 2019-07-15 NOTE — Progress Notes (Signed)
Pharmacy Review for Dofetilide (Tikosyn) Initiation  Admit Complaint: 68 y.o. female admitted 07/15/2019 with atrial fibrillation to be initiated on dofetilide.   Assessment:  Patient Exclusion Criteria: If any screening criteria checked as "Yes", then  patient  should NOT receive dofetilide until criteria item is corrected. If "Yes" please indicate correction plan.  YES  NO Patient  Exclusion Criteria Correction Plan  []  [x]  Baseline QTc interval is greater than or equal to 440 msec. IF above YES box checked dofetilide contraindicated unless patient has ICD; then may proceed if QTc 500-550 msec or with known ventricular conduction abnormalities may proceed with QTc 550-600 msec. QTc =  438   []  [x]  Magnesium level is less than 1.8 mEq/l : Last magnesium:  Lab Results  Component Value Date   MG 2.0 07/15/2019         []  [x]  Potassium level is less than 4 mEq/l : Last potassium:  Lab Results  Component Value Date   K 4.9 07/15/2019         []  [x]  Patient is known or suspected to have a digoxin level greater than 2 ng/ml: No results found for: DIGOXIN    []  [x]  Creatinine clearance less than 20 ml/min (calculated using Cockcroft-Gault, actual body weight and serum creatinine): Estimated Creatinine Clearance: 34.8 mL/min (A) (by C-G formula based on SCr of 1.7 mg/dL (H)).    []  [x]  Patient has received drugs known to prolong the QT intervals within the last 48 hours (phenothiazines, tricyclics or tetracyclic antidepressants, erythromycin, H-1 antihistamines, cisapride, fluoroquinolones, azithromycin). Drugs not listed above may have an, as yet, undetected potential to prolong the QT interval, updated information on QT prolonging agents is available at this website:QT prolonging agents   []  [x]  Patient received a dose of hydrochlorothiazide (Oretic) alone or in any combination including triamterene (Dyazide, Maxzide) in the last 48 hours.   []  [x]  Patient received a medication known to  increase dofetilide plasma concentrations prior to initial dofetilide dose:  . Trimethoprim (Primsol, Proloprim) in the last 36 hours . Verapamil (Calan, Verelan) in the last 36 hours or a sustained release dose in the last 72 hours . Megestrol (Megace) in the last 5 days  . Cimetidine (Tagamet) in the last 6 hours . Ketoconazole (Nizoral) in the last 24 hours . Itraconazole (Sporanox) in the last 48 hours  . Prochlorperazine (Compazine) in the last 36 hours    []  [x]  Patient is known to have a history of torsades de pointes; congenital or acquired long QT syndromes.   []  [x]  Patient has received a Class 1 antiarrhythmic with less than 2 half-lives since last dose. (Disopyramide, Quinidine, Procainamide, Lidocaine, Mexiletine, Flecainide, Propafenone)   []  [x]  Patient has received amiodarone therapy in the past 3 months or amiodarone level is greater than 0.3 ng/ml.    Patient has been appropriately anticoagulated with apixaban.  Ordering provider was confirmed at [email or web address] if they are not listed on the [place] Prescribers list.  Goal of Therapy: Follow renal function, electrolytes, potential drug interactions, and dose adjustment. Provide education and 1 week supply at discharge.  Plan:  [x]   Physician selected initial dose within range recommended for patients level of renal function - will monitor for response.  []   Physician selected initial dose outside of range recommended for patients level of renal function - will discuss if the dose should be altered at this time.   Select One Calculated CrCl  Dose q12h  []  > 60 ml/min 500  mcg  [x]  40-60 ml/min 250 mcg  []  20-40 ml/min 125 mcg   2. Follow up QTc after the first 5 doses, renal function, electrolytes (K & Mg) daily x 3     days, dose adjustment, success of initiation and facilitate 1 week discharge supply as     clinically indicated.   Hildred Laser, PharmD Clinical Pharmacist **Pharmacist phone directory  can now be found on [email or web address] ([email or web address]).  Listed under [place].

## 2019-07-15 NOTE — Progress Notes (Signed)
Primary Care Physician: Antony Blackbird, MD Primary Cardiologist: Ree Kida Primary Electrophysiologist: none Referring Physician: Richardson Dopp PA-C   Tracy James is a 68 y.o. female with a history of persistent atrial fibrillation, tachycardia induced cardiomyopathy, AS, HTN, and tobacco abuse who presents for consultation in the [facility]. She was admitted in 11/2018 with AF with RVR. 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 Unfortunately, the TEE continued to show residual clot in the LA. Therefore, DCCV was not performed. She was seen infollow upon 02/2019 and her HR was uncontrolled. Unfortunately, repeat DCCV was not arranged due to COVID-19 restrictions (elective procedures on hold). She was placed on Digoxin for rate control.  Patient presents today for dofetilide loading. She denies any missed doses of anticoagulation. She has checked on the price of the medication and it will be affordable to her.   Today, she denies symptoms of palpitations, chest pain, shortness of breath, orthopnea, PND, lower extremity edema, dizziness, presyncope, syncope, snoring, daytime somnolence, bleeding, or neurologic sequela. The patient is tolerating medications without difficulties and is otherwise without complaint today.    Atrial Fibrillation Risk Factors:  she does have symptoms of sleep apnea. she does not have a history of rheumatic fever. she does not have a history of alcohol use. The patient does not have a history of early familial atrial fibrillation or other arrhythmias.  she has a BMI of Body mass index is 28.73 kg/m.Marland Kitchen Filed Weights   07/15/19 1012  Weight: 80.7 kg    Family History  Problem Relation Age of Onset  . Arthritis Mother   . Cancer Mother        colon cancer  . Diabetes Mother    . Hypertension Mother   . Heart murmur Mother   . Cancer Brother        LUNG- smoking, [place]  . Early death Brother   . Heart murmur Brother   . Arthritis Maternal Grandmother   . Heart disease Maternal Grandmother        massive heart attack  . Heart disease Paternal Grandmother   . Asthma Brother   . Diabetes Brother   . Hypertension Brother   . Heart murmur Brother   . Heart disease Father        [id number]- ruptered     Atrial Fibrillation Management history:  Previous antiarrhythmic drugs: none Previous cardioversions: none Previous ablations: none CHADS2VASC score: 4 Anticoagulation history: Eliquis   Past Medical History:  Diagnosis Date  . Allergy    SEASONAL  . Heart murmur   . Hypertension    Past Surgical History:  Procedure Laterality Date  . TEE WITHOUT CARDIOVERSION N/A 12/06/2018   Procedure: TRANSESOPHAGEAL ECHOCARDIOGRAM (TEE);  Surgeon: Lelon Perla, MD;  Location: [facility] ENDOSCOPY;  Service: Cardiovascular;  Laterality: N/A;  . TEE WITHOUT CARDIOVERSION N/A 01/20/2019   Procedure: TRANSESOPHAGEAL ECHOCARDIOGRAM (TEE);  Surgeon: [name], MD;  Location: [facility] ENDOSCOPY;  Service: Cardiovascular;  Laterality: N/A;  . TONSILLECTOMY      Current Outpatient Medications  Medication Sig Dispense Refill  . acetaminophen (TYLENOL) 500 MG tablet Take 500 mg by mouth every 6 (six) hours as needed (for pain.).    Marland Kitchen apixaban (ELIQUIS) 5 MG TABS tablet Take 1 tablet (5 mg total) by mouth every 12 (twelve) hours. 60 tablet 5  . Calcium  600-200 MG-UNIT tablet Take 1 tablet by mouth daily.    . digoxin (LANOXIN) 0.125 MG tablet Take 0.5 tablets (0.0625 mg total) by mouth every other day. 7.5 tablet 11  . diltiazem (CARDIZEM CD) 300 MG 24 hr capsule Take 1 capsule (300 mg total) by mouth daily. 90 capsule 3  . furosemide (LASIX) 40 MG tablet Take 1 tablet (40 mg total) by mouth daily. 30 tablet 6  . hydrALAZINE (APRESOLINE) 25 MG tablet Take 1 tablet (25 mg  total) by mouth 3 (three) times daily. 90 tablet 11  . loratadine (CLARITIN) 10 MG tablet Take 10 mg by mouth daily as needed for allergies.    . metoprolol tartrate (LOPRESSOR) 50 MG tablet Take 2 tablets (100 mg total) by mouth 2 (two) times daily. 360 tablet 3  . Omega-3 1000 MG CAPS Take 1,000 mg by mouth daily.     Marland Kitchen VITAMIN D PO Take 1 tablet by mouth daily.    . nitroGLYCERIN (NITROSTAT) 0.4 MG SL tablet Place 1 tablet (0.4 mg total) under the tongue every 5 (five) minutes as needed for chest pain. (Patient not taking: Reported on 07/15/2019) 20 tablet 0   No current facility-administered medications for this encounter.     No Known Allergies  Social History   Socioeconomic History  . Marital status: Divorced    Spouse name: Not on file  . Number of children: Not on file  . Years of education: Not on file  . Highest education level: Not on file  Occupational History  . Not on file  Social Needs  . Financial resource strain: Not on file  . Food insecurity    Worry: Not on file    Inability: Not on file  . Transportation needs    Medical: Not on file    Non-medical: Not on file  Tobacco Use  . Smoking status: Former Smoker    Quit date: 12/05/2011    Years since quitting: 7.6  . Smokeless tobacco: Never Used  Substance and Sexual Activity  . Alcohol use: Yes    Comment: occasionally  . Drug use: No  . Sexual activity: Not Currently    Birth control/protection: None  Lifestyle  . Physical activity    Days per week: Not on file    Minutes per session: Not on file  . Stress: Not on file  Relationships  . Social Herbalist on phone: Not on file    Gets together: Not on file    Attends religious service: Not on file    Active member of club or organization: Not on file    Attends meetings of clubs or organizations: Not on file    Relationship status: Not on file  . Intimate partner violence    Fear of current or ex partner: Not on file    Emotionally  abused: Not on file    Physically abused: Not on file    Forced sexual activity: Not on file  Other Topics Concern  . Not on file  Social History Narrative  . Not on file     ROS- All systems are reviewed and negative except as per the HPI above.  Physical Exam: Vitals:   07/15/19 1012  BP: 124/62  Pulse: (!) 101  Weight: 80.7 kg  Height: 5\' 6"  (1.676 m)    GEN- The patient is well appearing, alert and oriented x 3 today.   Head- normocephalic, atraumatic Eyes-  Sclera clear, conjunctiva pink Ears-  hearing intact Oropharynx- clear Neck- supple  Lungs- Clear to ausculation bilaterally, normal work of breathing Heart- irregular rate and rhythm, no murmurs, rubs or gallops  GI- soft, NT, ND, + BS Extremities- no clubbing, cyanosis, or edema MS- no significant deformity or atrophy Skin- no rash or lesion Psych- euthymic mood, full affect Neuro- strength and sensation are intact  Wt Readings from Last 3 Encounters:  07/15/19 80.7 kg  05/20/19 78.5 kg  04/01/19 77.1 kg    EKG today demonstrates afib HR 101 QRS 62, QTc 438  Echo 06/18/19 demonstrated  1. Moderate hypokinesis of the left ventricular, mid-apical inferior wall.  2. The left ventricle has normal systolic function, with an ejection fraction of 55-60%. The cavity size was normal. There is moderately increased left ventricular wall thickness. Left ventricular diastolic Doppler parameters are indeterminate.  3. The right ventricle has normal systolic function. The cavity was normal. There is no increase in right ventricular wall thickness.  4. Left atrial size was mildly dilated.  5. The aortic valve is tricuspid. Mild thickening of the aortic valve. Mild calcification of the aortic valve. Aortic valve regurgitation is trivial by color flow Doppler. Moderate stenosis of the aortic valve.  6. Peak velocity 3.30m/s, mean gradient 98mmHg.  Epic records are reviewed at length today  Assessment and Plan:  1.  Persistent atrial fibrillation Patient wants to pursue dofetilide, aware of risk vrs benefit. Aware of price of the drug. Patient will continue on anticoagulation, states no missed doses in the last three weeks. Per Dr Rayann Heman, she does not require repeat TEE. No benadryl use PharmD has screened drugs and no QT prolonging drugs on board QTc in SR 438 ms, Labs today show creatinine at 1.70, K+ 4.9 and mag 2.0, CrCl calculated at 41 mL/min  This patients CHA2DS2-VASc Score and unadjusted Ischemic Stroke Rate (% per year) is equal to 4.8 % stroke rate/year from a score of 4  Above score calculated as 1 point each if present [CHF, HTN, DM, Vascular=MI/PAD/Aortic Plaque, Age if 65-74, or Female] Above score calculated as 2 points each if present [Age > 75, or Stroke/TIA/TE]   2. Snoring The importance of adequate treatment of sleep apnea was discussed today in order to improve our ability to maintain sinus rhythm long term. May benefit from sleep study after dofetilide admission.   4. Cardiomyopathy  EF appears to have recovered, 55-60% on recent echo. Continue present therapy.    To be admitted later today once bed becomes available.   [facility] [address] [place], [place] [address] [phone] 07/15/2019 10:20 AM

## 2019-07-16 LAB — BASIC METABOLIC PANEL
Anion gap: 10 (ref 5–15)
BUN: 28 mg/dL — ABNORMAL HIGH (ref 8–23)
CO2: 25 mmol/L (ref 22–32)
Calcium: 9.2 mg/dL (ref 8.9–10.3)
Chloride: 103 mmol/L (ref 98–111)
Creatinine, Ser: 1.37 mg/dL — ABNORMAL HIGH (ref 0.44–1.00)
GFR calc Af Amer: 46 mL/min — ABNORMAL LOW (ref >60)
GFR calc non Af Amer: 40 mL/min — ABNORMAL LOW (ref >60)
Glucose, Bld: 99 mg/dL (ref 70–99)
Potassium: 4 mmol/L (ref 3.5–5.1)
Sodium: 138 mmol/L (ref 135–145)

## 2019-07-16 LAB — MAGNESIUM: Magnesium: 1.9 mg/dL (ref 1.7–2.4)

## 2019-07-16 MED ORDER — MAGNESIUM SULFATE IN D5W 1-5 GM/100ML-% IV SOLN
1.0000 g | Freq: Once | INTRAVENOUS | Status: AC
  Administered 2019-07-16: 1 g via INTRAVENOUS
  Filled 2019-07-16: qty 100

## 2019-07-16 NOTE — Progress Notes (Addendum)
Progress Note  Patient Name: Tracy James Date of Encounter: 07/16/2019  Primary Cardiologist: Sherren Mocha, MD   Subjective   No complaints, no CP or SOB, tolerating drug  Inpatient Medications    Scheduled Meds: . apixaban  5 mg Oral BID  . calcium-vitamin D  1 tablet Oral Daily  . cholecalciferol  500 Units Oral Daily  . [START ON 07/17/2019] digoxin  0.0625 mg Oral QODAY  . diltiazem  300 mg Oral Daily  . dofetilide  250 mcg Oral BID  . furosemide  40 mg Oral Daily  . hydrALAZINE  25 mg Oral TID  . metoprolol tartrate  100 mg Oral BID  . omega-3 acid ethyl esters  1,000 mg Oral Daily  . sodium chloride flush  3 mL Intravenous Q12H   Continuous Infusions: . sodium chloride    . magnesium sulfate bolus IVPB     PRN Meds: sodium chloride, acetaminophen, loratadine, sodium chloride flush   Vital Signs    Vitals:   07/15/19 1157 07/15/19 2016 07/15/19 2228 07/16/19 0611  BP: 113/82 132/77 (!) 154/88 134/77  Pulse: 98 68 78 65  Resp:  20  18  Temp: (!) 97.5 F (36.4 C) 97.9 F (36.6 C)  98.3 F (36.8 C)  TempSrc: Oral Oral  Oral  SpO2: 95% 100%  99%  Weight: 80.5 kg   80 kg  Height: 5\' 6"  (1.676 m)       Intake/Output Summary (Last 24 hours) at 07/16/2019 0926 Last data filed at 07/15/2019 2100 Gross per 24 hour  Intake 480 ml  Output -  Net 480 ml   Last 3 Weights 07/16/2019 07/15/2019 07/15/2019  Weight (lbs) 176 lb 6.4 oz 177 lb 8 oz 178 lb  Weight (kg) 80.015 kg 80.513 kg 80.74 kg      Telemetry    AFib 60's-80's - Personally Reviewed  ECG    AFib 77, QTc stable - Personally Reviewed  Physical Exam   GEN: No acute distress.   Neck: No JVD Cardiac: irreg-irreg, no murmurs, rubs, or gallops.  Respiratory: CTA b/l GI: Soft, nontender, non-distended  MS: No edema; No deformity. Neuro:  Nonfocal  Psych: Normal affect   Labs    High Sensitivity Troponin:  No results for input(s): TROPONINIHS in the last 720 hours.    Cardiac EnzymesNo  results for input(s): TROPONINI in the last 168 hours. No results for input(s): TROPIPOC in the last 168 hours.   Chemistry Recent Labs  Lab 07/15/19 1017 07/16/19 0434  NA 139 138  K 4.9 4.0  CL 102 103  CO2 27 25  GLUCOSE 119* 99  BUN 31* 28*  CREATININE 1.70* 1.37*  CALCIUM 9.4 9.2  GFRNONAA 31* 40*  GFRAA 36* 46*  ANIONGAP 10 10     HematologyNo results for input(s): WBC, RBC, HGB, HCT, MCV, MCH, MCHC, RDW, PLT in the last 168 hours.  BNPNo results for input(s): BNP, PROBNP in the last 168 hours.   DDimer No results for input(s): DDIMER in the last 168 hours.   Radiology    No results found.  Cardiac Studies   TEE 01/20/2019 EF 30-35, atrial level shunting, small fixed LA thrombus on interior of LAA, no RAA clot  Transesophageal echocardiogram 01/02/2019 EF 30-35, diffuse HK, AV cusp separation severely reduced, mild AI, mild MR, moderate LAE, positive LAA clot, severely reduced RV SF, moderate RAE with positive clot in the atrial cavity, small pericardial effusion, poss thrombus in pericardial space  Echo 12/04/2018 Mild LVH, EF 40-45, diffuse HK, mild to moderate aortic stenosis (mean 19, peak 30), moderate MR, mild LAE, moderately reduced RV SF, PASP 28  CXR 12/03/18 IMPRESSION: 1. Diffuse peribronchial cuffing concerning for bronchitis, with probable left lower lobe bronchopneumonia. 2. Trace bilateral pleural effusions. 3. Aortic atherosclerosis.  Carotid US 10/10/2018 Summary: Right Carotid: There is no evidence of stenosis in the right ICA. Left Carotid: There is no evidence of stenosis in the left ICA. Vertebrals: Bilateral vertebral arteries demonstrate antegrade flow. Subclavians: Normal flow hemodynamics were seen in bilateral subclavian arteries.  Echo 08/01/17 Mod LVH, EF 60-65, no RWMA, Gr 2 DD, mod AS (mean 35, peak 60), LVOT/AV velocity ratio 0.21, trivial MR, mild LAE  Coronary CTA 07/25/17 IMPRESSION: 1. Coronary calcium  score of 0. This was 0 percentile for age and sex matched control. 2. Normal coronary origin with right dominance. 3. No evidence of CAD. 4. Unusually thickened leaflets of the tricuspid aortic valve mild calcifications in the non-coronary cusp. Leaflets opening can't be evaluated as only diastolic images were obtained. An echocardiogram is recommended for further evaluation.  Chest CTA 07/19/17 IMPRESSION: 1. No thoracic aortic aneurysm or dissection. There are foci of calcification in the thoracic aorta and great vessels. There is mild calcification in the aortic valve. No evident pulmonary embolus. 2. There is a degree of centrilobular emphysematous change. No edema or consolidation. There is bibasilar atelectasis. 3. Single mildly prominent pretracheal lymph node, likely reactive in etiology. Aortic Atherosclerosis (ICD10-I70.0) and Emphysema (ICD10-J43.9).  Carotid US 12/14 There is no evidence of a hemodynamically significant carotid stenosis.    Patient Profile     68 y.o. female w/PMHx of chronic CHF (systolic) felt tachy mediated, VHD w/AS, HTN, smoker, persistent AFib admitted for Tikosyn initiation  Assessment & Plan    1. Persistent AFib     CHA2DS2Vasc is 4, on Eliquis, appropriately dosed     Tikosyn load in process      K+ 4.0      Mag 1.9 (replaced)      Creat 1.37 (stable)      QTc stable  Plan DCCV tomorrow if not in SR, pt is aware and agreeable  2. Chronic CHF (systolic)     Appears compensated currently  3. NICM     On BB, diuretic, no ACE/ARB presumably 2/2 CKD  4. HTN     No changes today  5. CKD (III)     Stable Creat a bit lower today  For questions or updates, please contact [facility] Please consult [email or web address] for contact info under        Signed, Baldwin Jamaica, PA-C  07/16/2019, 9:26 AM     I have seen, examined the patient, and reviewed the above assessment and plan.  Changes to above are made where necessary.  On exam,  iRRR.  Continue tikosyn load.  May require cardioversion tomorrow if still in AF.  She has been adequately anticoagulated and reports compliance with anticoagulation without uninterrupted over the past 4 weeks.  She understands risks of cardioversion, including but not limited to stroke risks and wishes to proceed.  Co Sign: Thompson Grayer, MD

## 2019-07-16 NOTE — Progress Notes (Signed)
Patient converted from Afib to NSR. EKG obtained and MD notified. Will continue to monitor.

## 2019-07-16 NOTE — Telephone Encounter (Signed)
I faxed the tier exception to [facility] at [facility]

## 2019-07-16 NOTE — Telephone Encounter (Signed)
The form was received today and faxed to our A-fib clinic. Will forward this phone note to that office.

## 2019-07-16 NOTE — Discharge Instructions (Addendum)
Information on my medicine - ELIQUIS (apixaban)  Why was Eliquis prescribed for you? Eliquis was prescribed for you to reduce the risk of a blood clot forming that can cause a stroke if you have a medical condition called atrial fibrillation (a type of irregular heartbeat).  What do You need to know about Eliquis ? Take your Eliquis TWICE DAILY - one tablet in the morning and one tablet in the evening with or without food. If you have difficulty swallowing the tablet whole please discuss with your pharmacist how to take the medication safely.  Take Eliquis exactly as prescribed by your doctor and DO NOT stop taking Eliquis without talking to the doctor who prescribed the medication.  Stopping may increase your risk of developing a stroke.  Refill your prescription before you run out.  After discharge, you should have regular check-up appointments with your healthcare provider that is prescribing your Eliquis.  In the future your dose may need to be changed if your kidney function or weight changes by a significant amount or as you get older.  What do you do if you miss a dose? If you miss a dose, take it as soon as you remember on the same day and resume taking twice daily.  Do not take more than one dose of ELIQUIS at the same time to make up a missed dose.  Important Safety Information A possible side effect of Eliquis is bleeding. You should call your healthcare provider right away if you experience any of the following: ? Bleeding from an injury or your nose that does not stop. ? Unusual colored urine (red or dark brown) or unusual colored stools (red or black). ? Unusual bruising for unknown reasons. ? A serious fall or if you hit your head (even if there is no bleeding).  Some medicines may interact with Eliquis and might increase your risk of bleeding or clotting while on Eliquis. To help avoid this, consult your healthcare provider or pharmacist prior to using any new  prescription or non-prescription medications, including herbals, vitamins, non-steroidal anti-inflammatory drugs (NSAIDs) and supplements.  This website has more information on Eliquis (apixaban): http://www.eliquis.com/eliquis/home Dofetilide capsules What is this medicine? DOFETILIDE (doe FET il ide) is an antiarrhythmic drug. It helps make your heart beat regularly. This medicine also helps to slow rapid heartbeats. This medicine may be used for other purposes; ask your health care provider or pharmacist if you have questions. COMMON BRAND NAME(S): Tikosyn What should I tell my health care provider before I take this medicine? They need to know if you have any of these conditions:  heart disease  history of irregular heartbeat  history of low levels of potassium or magnesium in the blood  kidney disease  liver disease  an unusual or allergic reaction to dofetilide, other medicines, foods, dyes, or preservatives  pregnant or trying to get pregnant  breast-feeding How should I use this medicine? Take this medicine by mouth with a glass of water. Follow the directions on the prescription label. Do not take with grapefruit juice. You can take it with or without food. If it upsets your stomach, take it with food. Take your medicine at regular intervals. Do not take it more often than directed. Do not stop taking except on your doctor's advice. A special MedGuide will be given to you by the pharmacist with each prescription and refill. Be sure to read this information carefully each time. Talk to your pediatrician regarding the use of this medicine in  children. Special care may be needed. Overdosage: If you think you have taken too much of this medicine contact a poison control center or emergency room at once. NOTE: This medicine is only for you. Do not share this medicine with others. What if I miss a dose? If you miss a dose, skip it. Take your next dose at the normal time. Do not  take extra or 2 doses at the same time to make up for the missed dose. What may interact with this medicine? Do not take this medicine with any of the following medications:  cimetidine  cisapride  dolutegravir  dronedarone  hydrochlorothiazide  ketoconazole  megestrol  pimozide  prochlorperazine  thioridazine  trimethoprim  verapamil This medicine may also interact with the following medications:  amiloride  cannabinoids  certain antibiotics like erythromycin or clarithromycin  certain antiviral medicines for HIV or hepatitis  certain medicines for depression, anxiety, or psychotic disorders  digoxin  diltiazem  grapefruit juice  metformin  nefazodone  other medicines that prolong the QT interval (an abnormal heart rhythm)  quinine  triamterene  zafirlukast  ziprasidone This list may not describe all possible interactions. Give your health care provider a list of all the medicines, herbs, non-prescription drugs, or dietary supplements you use. Also tell them if you smoke, drink alcohol, or use illegal drugs. Some items may interact with your medicine. What should I watch for while using this medicine? Your condition will be monitored carefully while you are receiving this medicine. What side effects may I notice from receiving this medicine? Side effects that you should report to your doctor or health care professional as soon as possible:  allergic reactions like skin rash, itching or hives, swelling of the face, lips, or tongue  breathing problems  chest pain or chest tightness  dizziness  signs and symptoms of a dangerous change in heartbeat or heart rhythm like chest pain; dizziness; fast or irregular heartbeat; palpitations; feeling faint or lightheaded, falls; breathing problems  signs and symptoms of electrolyte imbalance like severe diarrhea, unusual sweating, vomiting, loss of appetite, increased thirst  swelling of the ankles,  legs, or feet  tingling, numbness in the hands or feet Side effects that usually do not require medical attention (report to your doctor or health care professional if they continue or are bothersome):  diarrhea  general ill feeling or flu-like symptoms  headache  nausea  trouble sleeping  stomach pain This list may not describe all possible side effects. Call your doctor for medical advice about side effects. You may report side effects to FDA at 1-800-FDA-1088. Where should I keep my medicine? Keep out of the reach of children. Store at room temperature between 15 and 30 degrees C (59 and 86 degrees F). Throw away any unused medicine after the expiration date. NOTE: This sheet is a summary. It may not cover all possible information. If you have questions about this medicine, talk to your doctor, pharmacist, or health care provider.  2020 Elsevier/Gold Standard (2018-11-11 10:18:48)  Atrial Fibrillation  Atrial fibrillation is a type of heartbeat that is irregular or fast (rapid). If you have this condition, your heart beats without any order. This makes it hard for your heart to pump blood in a normal way. Having this condition gives you more risk for stroke, heart failure, and other heart problems. Atrial fibrillation may start all of a sudden and then stop on its own, or it may become a long-lasting problem. What are the causes? This  condition may be caused by heart conditions, such as:  High blood pressure.  Heart failure.  Heart valve disease.  Heart surgery. Other causes include:  Pneumonia.  Obstructive sleep apnea.  Lung cancer.  Thyroid disease.  Drinking too much alcohol. Sometimes the cause is not known. What increases the risk? You are more likely to develop this condition if:  You smoke.  You are older.  You have diabetes.  You are overweight.  You have a family history of this condition.  You exercise often and hard. What are the signs or  symptoms? Common symptoms of this condition include:  A feeling like your heart is beating very fast.  Chest pain.  Feeling short of breath.  Feeling light-headed or weak.  Getting tired easily. Follow these instructions at home: Medicines  Take over-the-counter and prescription medicines only as told by your doctor.  If your doctor gives you a blood-thinning medicine, take it exactly as told. Taking too much of it can cause bleeding. Taking too little of it does not protect you against clots. Clots can cause a stroke. Lifestyle      Do not use any tobacco products. These include cigarettes, chewing tobacco, and e-cigarettes. If you need help quitting, ask your doctor.  Do not drink alcohol.  Do not drink beverages that have caffeine. These include coffee, soda, and tea.  Follow diet instructions as told by your doctor.  Exercise regularly as told by your doctor. General instructions  If you have a condition that causes breathing to stop for a short period of time (apnea), treat it as told by your doctor.  Keep a healthy weight. Do not use diet pills unless your doctor says they are safe for you. Diet pills may make heart problems worse.  Keep all follow-up visits as told by your doctor. This is important. Contact a doctor if:  You notice a change in the speed, rhythm, or strength of your heartbeat.  You are taking a blood-thinning medicine and you see more bruising.  You get tired more easily when you move or exercise.  You have a sudden change in weight. Get help right away if:   You have pain in your chest or your belly (abdomen).  You have trouble breathing.  You have blood in your vomit, poop, or pee (urine).  You have any signs of a stroke. "BE FAST" is an easy way to remember the main warning signs: ? B - Balance. Signs are dizziness, sudden trouble walking, or loss of balance. ? E - Eyes. Signs are trouble seeing or a change in how you see. ? F -  Face. Signs are sudden weakness or loss of feeling in the face, or the face or eyelid drooping on one side. ? A - Arms. Signs are weakness or loss of feeling in an arm. This happens suddenly and usually on one side of the body. ? S - Speech. Signs are sudden trouble speaking, slurred speech, or trouble understanding what people say. ? T - Time. Time to call emergency services. Write down what time symptoms started.  You have other signs of a stroke, such as: ? A sudden, very bad headache with no known cause. ? Feeling sick to your stomach (nausea). ? Throwing up (vomiting). ? Jerky movements you cannot control (seizure). These symptoms may be an emergency. Do not wait to see if the symptoms will go away. Get medical help right away. Call your local emergency services (911 in the [place] Do  not drive yourself to the hospital. Summary  Atrial fibrillation is a type of heartbeat that is irregular or fast (rapid).  You are at higher risk of this condition if you smoke, are older, have diabetes, or are overweight.  Follow your doctor's instructions about medicines, diet, exercise, and follow-up visits.  Get help right away if you think that you have signs of a stroke. This information is not intended to replace advice given to you by your health care provider. Make sure you discuss any questions you have with your health care provider. Document Released: 08/29/2008 Document Revised: 01/24/2018 Document Reviewed: 01/11/2018 Elsevier Patient Education  2020 [employer].

## 2019-07-16 NOTE — H&P (Signed)
Primary Care Physician: Antony Blackbird, MD Primary Cardiologist: Ree Kida Primary Electrophysiologist: none Referring Physician: Richardson Dopp PA-C   Tracy James is a 68 y.o. female with a history of persistent atrial fibrillation, tachycardia induced cardiomyopathy, AS, HTN, and tobacco abuse who presents for consultation in the [facility]. She was admitted in 11/2018 with AF with RVR. 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 Unfortunately, the TEE continued to show residual clot in the LA. Therefore, DCCV was not performed. She was seen infollow upon 02/2019 and her HR was uncontrolled. Unfortunately, repeat DCCV was not arranged due to COVID-19 restrictions (elective procedures on hold). She was placed on Digoxin for rate control.  Patient presents today for dofetilide loading. She denies any missed doses of anticoagulation. She has checked on the price of the medication and it will be affordable to her.   Today, she denies symptoms of palpitations, chest pain, shortness of breath, orthopnea, PND, lower extremity edema, dizziness, presyncope, syncope, snoring, daytime somnolence, bleeding, or neurologic sequela. The patient is tolerating medications without difficulties and is otherwise without complaint today.    Atrial Fibrillation Risk Factors:  she does have symptoms of sleep apnea. she does not have a history of rheumatic fever. she does not have a history of alcohol use. The patient does not have a history of early familial atrial fibrillation or other arrhythmias.  she has a BMI of Body mass index is 28.47 kg/m.Marland Kitchen Filed Weights   07/15/19 1157 07/16/19 0611  Weight: 80.5 kg 80 kg    Family History  Problem Relation Age of Onset  . Arthritis Mother   . Cancer Mother        colon cancer   . Diabetes Mother   . Hypertension Mother   . Heart murmur Mother   . Cancer Brother        LUNG- smoking, [place]  . Early death Brother   . Heart murmur Brother   . Arthritis Maternal Grandmother   . Heart disease Maternal Grandmother        massive heart attack  . Heart disease Paternal Grandmother   . Asthma Brother   . Diabetes Brother   . Hypertension Brother   . Heart murmur Brother   . Heart disease Father        anuersym- ruptered     Atrial Fibrillation Management history:  Previous antiarrhythmic drugs: none Previous cardioversions: none Previous ablations: none CHADS2VASC score: 4 Anticoagulation history: Eliquis   Past Medical History:  Diagnosis Date  . Allergy    SEASONAL  . Heart murmur   . Hypertension    Past Surgical History:  Procedure Laterality Date  . TEE WITHOUT CARDIOVERSION N/A 12/06/2018   Procedure: TRANSESOPHAGEAL ECHOCARDIOGRAM (TEE);  Surgeon: Lelon Perla, MD;  Location: [facility] ENDOSCOPY;  Service: Cardiovascular;  Laterality: N/A;  . TEE WITHOUT CARDIOVERSION N/A 01/20/2019   Procedure: TRANSESOPHAGEAL ECHOCARDIOGRAM (TEE);  Surgeon: [name], MD;  Location: [facility] ENDOSCOPY;  Service: Cardiovascular;  Laterality: N/A;  . TONSILLECTOMY      Current Facility-Administered Medications  Medication Dose Route Frequency Provider Last Rate Last Dose  . 0.9 %  sodium chloride infusion  250 mL Intravenous PRN Fenton, Clint R, PA      . acetaminophen (TYLENOL) tablet 500 mg  500 mg Oral Q6H PRN Fenton, Clint R, PA      .  apixaban (ELIQUIS) tablet 5 mg  5 mg Oral BID Fenton, Clint R, PA   5 mg at 07/15/19 2228  . calcium-vitamin D (OSCAL WITH D) 500-200 MG-UNIT per tablet 1 tablet  1 tablet Oral Daily Fenton, Clint R, PA      . cholecalciferol (VITAMIN D3) tablet 500 Units  500 Units Oral Daily Fenton, Clint R, PA      . [START ON 07/17/2019] digoxin (LANOXIN) tablet 0.0625 mg  0.0625 mg Oral QODAY Fenton, Clint R, PA      . diltiazem  (CARDIZEM CD) 24 hr capsule 300 mg  300 mg Oral Daily Fenton, Clint R, PA      . dofetilide (TIKOSYN) capsule 250 mcg  250 mcg Oral BID Fenton, Clint R, PA   250 mcg at 07/16/19 0757  . furosemide (LASIX) tablet 40 mg  40 mg Oral Daily Fenton, Clint R, PA      . hydrALAZINE (APRESOLINE) tablet 25 mg  25 mg Oral TID Fenton, Clint R, PA   25 mg at 07/15/19 2229  . loratadine (CLARITIN) tablet 10 mg  10 mg Oral Daily PRN Fenton, Clint R, PA      . magnesium sulfate IVPB 1 g 100 mL  1 g Intravenous Once Baldwin [place], PA-C      . metoprolol tartrate (LOPRESSOR) tablet 100 mg  100 mg Oral BID Fenton, Clint R, PA   100 mg at 07/15/19 2228  . omega-3 acid ethyl esters (LOVAZA) capsule 1,000 mg  1,000 mg Oral Daily Fenton, Clint R, PA      . sodium chloride flush (NS) 0.9 % injection 3 mL  3 mL Intravenous Q12H Fenton, Clint R, PA      . sodium chloride flush (NS) 0.9 % injection 3 mL  3 mL Intravenous PRN Fenton, Clint R, PA        No Known Allergies  Social History   Socioeconomic History  . Marital status: Divorced    Spouse name: Not on file  . Number of children: Not on file  . Years of education: Not on file  . Highest education level: Not on file  Occupational History  . Not on file  Social Needs  . Financial resource strain: Not on file  . Food insecurity    Worry: Not on file    Inability: Not on file  . Transportation needs    Medical: Not on file    Non-medical: Not on file  Tobacco Use  . Smoking status: Former Smoker    Quit date: 12/05/2011    Years since quitting: 7.6  . Smokeless tobacco: Never Used  Substance and Sexual Activity  . Alcohol use: Yes    Comment: occasionally  . Drug use: No  . Sexual activity: Not Currently    Birth control/protection: None  Lifestyle  . Physical activity    Days per week: Not on file    Minutes per session: Not on file  . Stress: Not on file  Relationships  . Social Herbalist on phone: Not on file    Gets  together: Not on file    Attends religious service: Not on file    Active member of club or organization: Not on file    Attends meetings of clubs or organizations: Not on file    Relationship status: Not on file  . Intimate partner violence    Fear of current or ex partner: Not on file    Emotionally  abused: Not on file    Physically abused: Not on file    Forced sexual activity: Not on file  Other Topics Concern  . Not on file  Social History Narrative  . Not on file     ROS- All systems are reviewed and negative except as per the HPI above.  Physical Exam: Vitals:   07/15/19 1157 07/15/19 2016 07/15/19 2228 07/16/19 0611  BP: 113/82 132/77 (!) 154/88 134/77  Pulse: 98 68 78 65  Resp:  20  18  Temp: (!) 97.5 F (36.4 C) 97.9 F (36.6 C)  98.3 F (36.8 C)  TempSrc: Oral Oral  Oral  SpO2: 95% 100%  99%  Weight: 80.5 kg   80 kg  Height: 5\' 6"  (1.676 m)       GEN- The patient is well appearing, alert and oriented x 3 today.   Head- normocephalic, atraumatic Eyes-  Sclera clear, conjunctiva pink Ears- hearing intact Oropharynx- clear Neck- supple  Lungs- Clear to ausculation bilaterally, normal work of breathing Heart- irregular rate and rhythm, no murmurs, rubs or gallops  GI- soft, NT, ND, + BS Extremities- no clubbing, cyanosis, or edema MS- no significant deformity or atrophy Skin- no rash or lesion Psych- euthymic mood, full affect Neuro- strength and sensation are intact  Wt Readings from Last 3 Encounters:  07/16/19 80 kg  07/15/19 80.7 kg  05/20/19 78.5 kg    EKG today demonstrates afib HR 101 QRS 62, QTc 438  Echo 06/18/19 demonstrated  1. Moderate hypokinesis of the left ventricular, mid-apical inferior wall.  2. The left ventricle has normal systolic function, with an ejection fraction of 55-60%. The cavity size was normal. There is moderately increased left ventricular wall thickness. Left ventricular diastolic Doppler parameters are indeterminate.   3. The right ventricle has normal systolic function. The cavity was normal. There is no increase in right ventricular wall thickness.  4. Left atrial size was mildly dilated.  5. The aortic valve is tricuspid. Mild thickening of the aortic valve. Mild calcification of the aortic valve. Aortic valve regurgitation is trivial by color flow Doppler. Moderate stenosis of the aortic valve.  6. Peak velocity 3.9m/s, mean gradient 43mmHg.  Epic records are reviewed at length today  Assessment and Plan:  1. Persistent atrial fibrillation Patient wants to pursue dofetilide, aware of risk vrs benefit. Aware of price of the drug. Patient will continue on anticoagulation, states no missed doses in the last three weeks. Per Dr Rayann Heman, she does not require repeat TEE. No benadryl use PharmD has screened drugs and no QT prolonging drugs on board QTc in SR 438 ms, Labs today show creatinine at 1.70, K+ 4.9 and mag 2.0, CrCl calculated at 41 mL/min  This patients CHA2DS2-VASc Score and unadjusted Ischemic Stroke Rate (% per year) is equal to 4.8 % stroke rate/year from a score of 4  Above score calculated as 1 point each if present [CHF, HTN, DM, Vascular=MI/PAD/Aortic Plaque, Age if 65-74, or Female] Above score calculated as 2 points each if present [Age > 75, or Stroke/TIA/TE]   2. Snoring The importance of adequate treatment of sleep apnea was discussed today in order to improve our ability to maintain sinus rhythm long term. May benefit from sleep study after dofetilide admission.   4. Cardiomyopathy  EF appears to have recovered, 55-60% on recent echo. Continue present therapy.    To be admitted later today once bed becomes available.   [place]  [facility] [address] [place], [place] [address] [phone] 07/16/2019 8:51 AM   I have seen, examined the patient, and reviewed the above assessment and plan.  Changes to above are made where necessary.   On exam, iRRR.  Pt admitted for management of persistent afib.  Reports compliance with anticoagulation without interruption.  Will plan initiation of tikosyn at this time.  Co Sign: Thompson Grayer, MD 07/16/2019 8:52 AM

## 2019-07-16 NOTE — TOC Benefit Eligibility Note (Signed)
Transition of Care [facility]) Benefit Eligibility Note    Patient Details  Name: Tracy James MRN: [id number] Date of Birth: 04/06/1951   Medication/Dose: Phyllis Ginger 250 MCG   AND  DOFETILIDE  250 MCG BID  Covered?: Yes  Tier: 3 Drug  Prescription Coverage [facility]erred [facility]acy: [facility]  Spoke with Person/Company/Phone Number:: CHRIS  @ [employer] # [phone]  Co-Pay: $ 64.00  Prior Approval: No  Deductible: Met       Memory Argue Phone Number: 07/16/2019, 12:18 PM

## 2019-07-17 ENCOUNTER — Other Ambulatory Visit: Payer: Self-pay

## 2019-07-17 ENCOUNTER — Encounter (HOSPITAL_COMMUNITY): Payer: Self-pay | Admitting: General Practice

## 2019-07-17 LAB — BASIC METABOLIC PANEL
Anion gap: 9 (ref 5–15)
BUN: 27 mg/dL — ABNORMAL HIGH (ref 8–23)
CO2: 23 mmol/L (ref 22–32)
Calcium: 9.2 mg/dL (ref 8.9–10.3)
Chloride: 104 mmol/L (ref 98–111)
Creatinine, Ser: 1.4 mg/dL — ABNORMAL HIGH (ref 0.44–1.00)
GFR calc Af Amer: 45 mL/min — ABNORMAL LOW (ref >60)
GFR calc non Af Amer: 39 mL/min — ABNORMAL LOW (ref >60)
Glucose, Bld: 116 mg/dL — ABNORMAL HIGH (ref 70–99)
Potassium: 4.3 mmol/L (ref 3.5–5.1)
Sodium: 136 mmol/L (ref 135–145)

## 2019-07-17 LAB — MAGNESIUM: Magnesium: 2.1 mg/dL (ref 1.7–2.4)

## 2019-07-17 NOTE — Progress Notes (Addendum)
Progress Note  Patient Name: Tracy James Date of Encounter: 07/17/2019  Primary Cardiologist: Sherren Mocha, MD   Subjective   No complaints, no CP or SOB, tolerating drug  Inpatient Medications    Scheduled Meds: . apixaban  5 mg Oral BID  . calcium-vitamin D  1 tablet Oral Daily  . cholecalciferol  500 Units Oral Daily  . digoxin  0.0625 mg Oral QODAY  . diltiazem  300 mg Oral Daily  . dofetilide  250 mcg Oral BID  . furosemide  40 mg Oral Daily  . hydrALAZINE  25 mg Oral TID  . metoprolol tartrate  100 mg Oral BID  . omega-3 acid ethyl esters  1,000 mg Oral Daily  . sodium chloride flush  3 mL Intravenous Q12H   Continuous Infusions: . sodium chloride     PRN Meds: sodium chloride, acetaminophen, loratadine, sodium chloride flush   Vital Signs    Vitals:   07/16/19 0611 07/16/19 2045 07/16/19 2243 07/17/19 0558  BP: 134/77 140/72 (!) 129/93 133/63  Pulse: 65 66 62 (!) 55  Resp: 18 18  20   Temp: 98.3 F (36.8 C) 98 F (36.7 C)  98.1 F (36.7 C)  TempSrc: Oral Oral  Oral  SpO2: 99% 100%  99%  Weight: 80 kg   79.7 kg  Height:        Intake/Output Summary (Last 24 hours) at 07/17/2019 0803 Last data filed at 07/16/2019 2130 Gross per 24 hour  Intake 360 ml  Output -  Net 360 ml   Last 3 Weights 07/17/2019 07/16/2019 07/15/2019  Weight (lbs) 175 lb 11.2 oz 176 lb 6.4 oz 177 lb 8 oz  Weight (kg) 79.697 kg 80.015 kg 80.513 kg      Telemetry    SB/SR 50 (overnight) -60's- Personally Reviewed  ECG     SR 67, QTc is stable- Personally Reviewed  Physical Exam   GEN: No acute distress.   Neck: No JVD Cardiac:RRR, no murmurs, rubs, or gallops.  Respiratory: CTA b/l GI: Soft, nontender, non-distended  MS: No edema; No deformity. Neuro:  Nonfocal  Psych: Normal affect   Labs    High Sensitivity Troponin:  No results for input(s): TROPONINIHS in the last 720 hours.    Cardiac EnzymesNo results for input(s): TROPONINI in the last 168 hours. No  results for input(s): TROPIPOC in the last 168 hours.   Chemistry Recent Labs  Lab 07/15/19 1017 07/16/19 0434 07/17/19 0533  NA 139 138 136  K 4.9 4.0 4.3  CL 102 103 104  CO2 27 25 23   GLUCOSE 119* 99 116*  BUN 31* 28* 27*  CREATININE 1.70* 1.37* 1.40*  CALCIUM 9.4 9.2 9.2  GFRNONAA 31* 40* 39*  GFRAA 36* 46* 45*  ANIONGAP 10 10 9      HematologyNo results for input(s): WBC, RBC, HGB, HCT, MCV, MCH, MCHC, RDW, PLT in the last 168 hours.  BNPNo results for input(s): BNP, PROBNP in the last 168 hours.   DDimer No results for input(s): DDIMER in the last 168 hours.   Radiology    No results found.  Cardiac Studies   TEE 01/20/2019 EF 30-35, atrial level shunting, small fixed LA thrombus on interior of LAA, no RAA clot  Transesophageal echocardiogram 01/02/2019 EF 30-35, diffuse HK, AV cusp separation severely reduced, mild AI, mild MR, moderate LAE, positive LAA clot, severely reduced RV SF, moderate RAE with positive clot in the atrial cavity, small pericardial effusion, poss thrombus in pericardial  space  Echo 12/04/2018 Mild LVH, EF 40-45, diffuse HK, mild to moderate aortic stenosis (mean 19, peak 30), moderate MR, mild LAE, moderately reduced RV SF, PASP 28  CXR 12/03/18 IMPRESSION: 1. Diffuse peribronchial cuffing concerning for bronchitis, with probable left lower lobe bronchopneumonia. 2. Trace bilateral pleural effusions. 3. Aortic atherosclerosis.  Carotid US 10/10/2018 Summary: Right Carotid: There is no evidence of stenosis in the right ICA. Left Carotid: There is no evidence of stenosis in the left ICA. Vertebrals: Bilateral vertebral arteries demonstrate antegrade flow. Subclavians: Normal flow hemodynamics were seen in bilateral subclavian arteries.  Echo 08/01/17 Mod LVH, EF 60-65, no RWMA, Gr 2 DD, mod AS (mean 35, peak 60), LVOT/AV velocity ratio 0.21, trivial MR, mild LAE  Coronary CTA 07/25/17 IMPRESSION: 1. Coronary calcium  score of 0. This was 0 percentile for age and sex matched control. 2. Normal coronary origin with right dominance. 3. No evidence of CAD. 4. Unusually thickened leaflets of the tricuspid aortic valve mild calcifications in the non-coronary cusp. Leaflets opening can't be evaluated as only diastolic images were obtained. An echocardiogram is recommended for further evaluation.  Chest CTA 07/19/17 IMPRESSION: 1. No thoracic aortic aneurysm or dissection. There are foci of calcification in the thoracic aorta and great vessels. There is mild calcification in the aortic valve. No evident pulmonary embolus. 2. There is a degree of centrilobular emphysematous change. No edema or consolidation. There is bibasilar atelectasis. 3. Single mildly prominent pretracheal lymph node, likely reactive in etiology. Aortic Atherosclerosis (ICD10-I70.0) and Emphysema (ICD10-J43.9).  Carotid US 12/14 There is no evidence of a hemodynamically significant carotid stenosis.    Patient Profile     68 y.o. female w/PMHx of chronic CHF (systolic) felt tachy mediated, VHD w/AS, HTN, smoker, persistent AFib admitted for Tikosyn initiation  Assessment & Plan    1. Persistent AFib     CHA2DS2Vasc is 4, on Eliquis, appropriately dosed     Tikosyn load in process      K+ 4.3      Mag 2.1      Creat 1.40 (stable)      QTc stable  Anticipate discharge tomorrow  2. Chronic CHF (systolic)     Appears compensated currently  3. NICM     On BB, diuretic, no ACE/ARB presumably 2/2 CKD  4. HTN     No changes today  5. CKD (III)     Stable     For questions or updates, please contact [place] Please consult [email or web address] for contact info under        Signed, Baldwin Jamaica, PA-C  07/17/2019, 8:03 AM    I have seen, examined the patient, and reviewed the above assessment and plan.  Changes to above are made where necessary.  On exam, RRR.  She has converted to sinus.  QT is stable.  Continue  current therapy.  Hopefully to discharge tomorrow.  Co Sign: Thompson Grayer, MD

## 2019-07-18 DIAGNOSIS — I519 Heart disease, unspecified: Secondary | ICD-10-CM

## 2019-07-18 LAB — BASIC METABOLIC PANEL
Anion gap: 10 (ref 5–15)
BUN: 29 mg/dL — ABNORMAL HIGH (ref 8–23)
CO2: 24 mmol/L (ref 22–32)
Calcium: 9.3 mg/dL (ref 8.9–10.3)
Chloride: 103 mmol/L (ref 98–111)
Creatinine, Ser: 1.44 mg/dL — ABNORMAL HIGH (ref 0.44–1.00)
GFR calc Af Amer: 44 mL/min — ABNORMAL LOW (ref >60)
GFR calc non Af Amer: 38 mL/min — ABNORMAL LOW (ref >60)
Glucose, Bld: 103 mg/dL — ABNORMAL HIGH (ref 70–99)
Potassium: 4.2 mmol/L (ref 3.5–5.1)
Sodium: 137 mmol/L (ref 135–145)

## 2019-07-18 LAB — MAGNESIUM: Magnesium: 2 mg/dL (ref 1.7–2.4)

## 2019-07-18 MED ORDER — DOFETILIDE 250 MCG PO CAPS: 250.0000 ug | ORAL_CAPSULE | Freq: Two times a day (BID) | ORAL | 6 refills | Status: DC

## 2019-07-18 MED ORDER — DOFETILIDE 250 MCG PO CAPS: 250.0000 ug | ORAL_CAPSULE | Freq: Two times a day (BID) | ORAL | 0 refills | Status: DC

## 2019-07-18 MED FILL — TIKOSYN 250 MCG CAPS: 250 | 7 days supply | Qty: 14 | Fill #0

## 2019-07-18 NOTE — Progress Notes (Signed)
Remains in sinus.  Doing well No arrhythmias overnight Qt is stable  Will repeat ecg later this am.  Our plan is to discharge with close follow-up in AF clinic.  Thompson Grayer MD, [facility] 07/18/2019 6:49 AM

## 2019-07-18 NOTE — Care Management Important Message (Signed)
Important Message  Patient Details  Name: Tracy James MRN: [id number] Date of Birth: 06-25-1951   Medicare Important Message Given:  Yes     Shelda Altes 07/18/2019, 12:32 PM

## 2019-07-18 NOTE — Progress Notes (Signed)
Patient discharged in stable condition to pirvate vehicle

## 2019-07-18 NOTE — Discharge Summary (Signed)
ELECTROPHYSIOLOGY PROCEDURE DISCHARGE SUMMARY    Patient ID: Tracy James,  MRN: [id number], DOB/AGE: 1950/12/07 68 y.o.  Admit date: 07/15/2019 Discharge date: 07/18/2019  Primary Care Physician: Antony Blackbird, MD  Primary Cardiologist: Dr. Burt Knack   Primary Discharge Diagnosis:  1.  persistent atrial fibrillation status post Tikosyn loading this admission      CHA2DS2Vasc is 4, on eliquis, appropriately dosed  Secondary Discharge Diagnosis:  1. NICM 2. Chronic CHF (systolic)     Currently compensated 3. HTN 4. CKD (III)   No Known Allergies   Procedures This Admission:  1.  Tikosyn loading   Brief HPI: Tracy James is a 68 y.o. female with a past medical history as noted above.  They were referred to the AFib clinic in the outpatient setting for treatment options of atrial fibrillation.  Risks, benefits, and alternatives to Tikosyn were reviewed with the patient who wished to proceed.    Hospital Course:  The patient was admitted and Tikosyn was initiated.  Renal function and electrolytes were followed during the hospitalization.  Her QTc remained stable.  She converted with drug and did not require DCCV  The patient was monitored until discharge on telemetry which demonstrated SB/SR.  On the day of discharge, she feels well, was examined by Dr Rayann Heman who considered her stable for discharge to home.  Follow-up has been arranged with AFib clinic in 1 and 4 weeks.     Physical Exam: Vitals:   07/17/19 2039 07/17/19 2203 07/18/19 0616 07/18/19 0753  BP: 136/66 129/64 126/87 128/81  Pulse: 61 62 (!) 55   Resp: 20  20   Temp: 98.1 F (36.7 C)  97.8 F (36.6 C)   TempSrc: Oral  Oral   SpO2: 100%  100%   Weight:   80.2 kg   Height:        GEN- The patient is well appearing, alert and oriented x 3 today.   HEENT: normocephalic, atraumatic; sclera clear, conjunctiva pink; hearing intact; neck supple, no JVP Lymph- no cervical lymphadenopathy Lungs- CTA b/l,  normal work of breathing.  No wheezes, rales, rhonchi Heart- RRR, no murmurs, rubs or gallops, PMI not laterally displaced GI- soft, non-tender, non-distended Extremities- no clubbing, cyanosis, or edema MS- no significant deformity or atrophy Skin- warm and dry, no rash or lesion Psych- euthymic mood, full affect Neuro- strength and sensation are intact   Labs:   Lab Results  Component Value Date   WBC 9.3 01/17/2019   HGB 15.1 01/17/2019   HCT 45.4 01/17/2019   MCV 90 01/17/2019   PLT 326 01/17/2019    Recent Labs  Lab 07/18/19 0423  NA 137  K 4.2  CL 103  CO2 24  BUN 29*  CREATININE 1.44*  CALCIUM 9.3  GLUCOSE 103*     Discharge Medications:  Allergies as of 07/18/2019   No Known Allergies     Medication List    TAKE these medications   acetaminophen 500 MG tablet Commonly known as: TYLENOL Take 500 mg by mouth every 6 (six) hours as needed (for pain.).   apixaban 5 MG Tabs tablet Commonly known as: ELIQUIS Take 1 tablet (5 mg total) by mouth every 12 (twelve) hours.   Calcium 600-200 MG-UNIT tablet Take 1 tablet by mouth daily.   digoxin 0.125 MG tablet Commonly known as: LANOXIN Take 0.5 tablets (0.0625 mg total) by mouth every other day.   diltiazem 300 MG 24 hr capsule Commonly known as:  CARDIZEM CD Take 1 capsule (300 mg total) by mouth daily.   dofetilide 250 MCG capsule Commonly known as: TIKOSYN Take 1 capsule (250 mcg total) by mouth 2 (two) times daily.   furosemide 40 MG tablet Commonly known as: LASIX Take 1 tablet (40 mg total) by mouth daily.   hydrALAZINE 25 MG tablet Commonly known as: APRESOLINE Take 1 tablet (25 mg total) by mouth 3 (three) times daily.   loratadine 10 MG tablet Commonly known as: CLARITIN Take 10 mg by mouth daily as needed for allergies.   metoprolol tartrate 50 MG tablet Commonly known as: LOPRESSOR Take 2 tablets (100 mg total) by mouth 2 (two) times daily.   nitroGLYCERIN 0.4 MG SL tablet  Commonly known as: NITROSTAT Place 1 tablet (0.4 mg total) under the tongue every 5 (five) minutes as needed for chest pain.   Omega-3 1000 MG Caps Take 1,000 mg by mouth daily.   VITAMIN D PO Take 1 tablet by mouth daily.       Disposition: Home  Discharge Instructions    Diet - low sodium heart healthy   Complete by: As directed    Increase activity slowly   Complete by: As directed      Follow-up Information    [facility] [place] [facility].   Specialty: Cardiology Why: 07/25/2019 @ 11:00AM 08/19/2019 @ 11:00AM Contact information: [address] [id number] [place] [phone]          Duration of Discharge Encounter: Greater than 30 minutes including physician time.  Creed Copper, PA-C 07/18/2019 11:42 AM

## 2019-07-25 ENCOUNTER — Other Ambulatory Visit: Payer: Self-pay

## 2019-07-25 ENCOUNTER — Other Ambulatory Visit (HOSPITAL_COMMUNITY): Payer: Self-pay | Admitting: *Deleted

## 2019-07-25 ENCOUNTER — Ambulatory Visit (HOSPITAL_COMMUNITY)
Admission: RE | Admit: 2019-07-25 | Discharge: 2019-07-25 | Disposition: A | Discharge status: Home / Self Care | Payer: Medicare Other | Source: Ambulatory Visit | Attending: Nurse Practitioner | Admitting: Nurse Practitioner

## 2019-07-25 ENCOUNTER — Encounter (HOSPITAL_COMMUNITY): Payer: Self-pay | Admitting: Nurse Practitioner

## 2019-07-25 VITALS — BP 138/70 | HR 47 | Ht 66.0 in | Wt 179.2 lb

## 2019-07-25 DIAGNOSIS — Z800 Family history of malignant neoplasm of digestive organs: Secondary | ICD-10-CM | POA: Insufficient documentation

## 2019-07-25 DIAGNOSIS — I10 Essential (primary) hypertension: Secondary | ICD-10-CM | POA: Diagnosis not present

## 2019-07-25 DIAGNOSIS — Z801 Family history of malignant neoplasm of trachea, bronchus and lung: Secondary | ICD-10-CM | POA: Insufficient documentation

## 2019-07-25 DIAGNOSIS — Z833 Family history of diabetes mellitus: Secondary | ICD-10-CM | POA: Insufficient documentation

## 2019-07-25 DIAGNOSIS — G4730 Sleep apnea, unspecified: Secondary | ICD-10-CM | POA: Insufficient documentation

## 2019-07-25 DIAGNOSIS — I429 Cardiomyopathy, unspecified: Secondary | ICD-10-CM | POA: Diagnosis not present

## 2019-07-25 DIAGNOSIS — Z8249 Family history of ischemic heart disease and other diseases of the circulatory system: Secondary | ICD-10-CM | POA: Insufficient documentation

## 2019-07-25 DIAGNOSIS — Z87891 Personal history of nicotine dependence: Secondary | ICD-10-CM | POA: Diagnosis not present

## 2019-07-25 DIAGNOSIS — I4819 Other persistent atrial fibrillation: Secondary | ICD-10-CM

## 2019-07-25 DIAGNOSIS — Z79899 Other long term (current) drug therapy: Secondary | ICD-10-CM | POA: Diagnosis not present

## 2019-07-25 DIAGNOSIS — R0683 Snoring: Secondary | ICD-10-CM | POA: Insufficient documentation

## 2019-07-25 DIAGNOSIS — Z7901 Long term (current) use of anticoagulants: Secondary | ICD-10-CM | POA: Insufficient documentation

## 2019-07-25 LAB — BASIC METABOLIC PANEL
Anion gap: 9 (ref 5–15)
BUN: 25 mg/dL — ABNORMAL HIGH (ref 8–23)
CO2: 26 mmol/L (ref 22–32)
Calcium: 9.4 mg/dL (ref 8.9–10.3)
Chloride: 101 mmol/L (ref 98–111)
Creatinine, Ser: 1.51 mg/dL — ABNORMAL HIGH (ref 0.44–1.00)
GFR calc Af Amer: 41 mL/min — ABNORMAL LOW (ref >60)
GFR calc non Af Amer: 36 mL/min — ABNORMAL LOW (ref >60)
Glucose, Bld: 93 mg/dL (ref 70–99)
Potassium: 4.5 mmol/L (ref 3.5–5.1)
Sodium: 136 mmol/L (ref 135–145)

## 2019-07-25 LAB — MAGNESIUM: Magnesium: 1.9 mg/dL (ref 1.7–2.4)

## 2019-07-25 NOTE — Patient Instructions (Signed)
Stop digoxin

## 2019-07-25 NOTE — Progress Notes (Addendum)
Primary Care Physician: Antony Blackbird, MD Primary Cardiologist: Ree Kida Primary Electrophysiologist: Dr Rayann Heman Referring Physician: Richardson Dopp PA-C   Tracy James is a 68 y.o. female with a history of persistent atrial fibrillation, tachycardia induced cardiomyopathy, AS, HTN, and tobacco abuse who presents for consultation in the [facility]. She was admitted in 11/2018 with AF with RVR. 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 Unfortunately, the TEE continued to show residual clot in the LA. Therefore, DCCV was not performed. She was seen infollow upon 02/2019 and her HR was uncontrolled. Unfortunately, repeat DCCV was not arranged due to COVID-19 restrictions (elective procedures on hold). She was placed on Digoxin for rate control.  Patient is s/p dofetilide loading. She reports that she feels well with no symptoms of afib. She is tolerating the medication well. She did note some lightheadedness when she woke this AM.  Today, she denies symptoms of palpitations, chest pain, shortness of breath, orthopnea, PND, lower extremity edema, dizziness, presyncope, syncope, snoring, daytime somnolence, bleeding, or neurologic sequela. The patient is tolerating medications without difficulties and is otherwise without complaint today.    Atrial Fibrillation Risk Factors:  she does have symptoms of sleep apnea. she does not have a history of rheumatic fever. she does not have a history of alcohol use. The patient does not have a history of early familial atrial fibrillation or other arrhythmias.  she has a BMI of Body mass index is 28.92 kg/m.Marland Kitchen Filed Weights   07/25/19 1027  Weight: 81.3 kg    Family History  Problem Relation Age of Onset  . Arthritis Mother   . Cancer Mother        colon cancer  .  Diabetes Mother   . Hypertension Mother   . Heart murmur Mother   . Cancer Brother        LUNG- smoking, [place]  . Early death Brother   . Heart murmur Brother   . Arthritis Maternal Grandmother   . Heart disease Maternal Grandmother        massive heart attack  . Heart disease Paternal Grandmother   . Asthma Brother   . Diabetes Brother   . Hypertension Brother   . Heart murmur Brother   . Heart disease Father        anuersym- ruptered     Atrial Fibrillation Management history:  Previous antiarrhythmic drugs: none Previous cardioversions: none Previous ablations: none CHADS2VASC score: 4 Anticoagulation history: Eliquis   Past Medical History:  Diagnosis Date  . Allergy    SEASONAL  . Atrial fibrillation ([place])   . Heart murmur   . Hypertension    Past Surgical History:  Procedure Laterality Date  . TEE WITHOUT CARDIOVERSION N/A 12/06/2018   Procedure: TRANSESOPHAGEAL ECHOCARDIOGRAM (TEE);  Surgeon: Lelon Perla, MD;  Location: [facility] ENDOSCOPY;  Service: Cardiovascular;  Laterality: N/A;  . TEE WITHOUT CARDIOVERSION N/A 01/20/2019   Procedure: TRANSESOPHAGEAL ECHOCARDIOGRAM (TEE);  Surgeon: [name], MD;  Location: [facility] ENDOSCOPY;  Service: Cardiovascular;  Laterality: N/A;  . TONSILLECTOMY      Current Outpatient Medications  Medication Sig Dispense Refill  . acetaminophen (TYLENOL) 500 MG tablet Take 500 mg by mouth every 6 (six) hours as needed (for pain.).    Marland Kitchen apixaban (ELIQUIS) 5 MG TABS tablet Take 1 tablet (5 mg total) by mouth every  12 (twelve) hours. 60 tablet 5  . Calcium 600-200 MG-UNIT tablet Take 1 tablet by mouth daily.    Marland Kitchen diltiazem (CARDIZEM CD) 300 MG 24 hr capsule Take 1 capsule (300 mg total) by mouth daily. 90 capsule 3  . dofetilide (TIKOSYN) 250 MCG capsule Take 1 capsule (250 mcg total) by mouth 2 (two) times daily. 60 capsule 6  . furosemide (LASIX) 40 MG tablet Take 1 tablet (40 mg total) by mouth daily. 30 tablet 6  .  hydrALAZINE (APRESOLINE) 25 MG tablet Take 1 tablet (25 mg total) by mouth 3 (three) times daily. 90 tablet 11  . loratadine (CLARITIN) 10 MG tablet Take 10 mg by mouth daily as needed for allergies.    . metoprolol tartrate (LOPRESSOR) 50 MG tablet Take 2 tablets (100 mg total) by mouth 2 (two) times daily. 360 tablet 3  . nitroGLYCERIN (NITROSTAT) 0.4 MG SL tablet Place 1 tablet (0.4 mg total) under the tongue every 5 (five) minutes as needed for chest pain. 20 tablet 0  . Omega-3 1000 MG CAPS Take 1,000 mg by mouth daily.     Marland Kitchen VITAMIN D PO Take 1 tablet by mouth daily.     No current facility-administered medications for this encounter.     No Known Allergies  Social History   Socioeconomic History  . Marital status: Divorced    Spouse name: Not on file  . Number of children: Not on file  . Years of education: Not on file  . Highest education level: Not on file  Occupational History  . Not on file  Social Needs  . Financial resource strain: Not on file  . Food insecurity    Worry: Not on file    Inability: Not on file  . Transportation needs    Medical: Not on file    Non-medical: Not on file  Tobacco Use  . Smoking status: Former Smoker    Quit date: 12/05/2011    Years since quitting: 7.6  . Smokeless tobacco: Never Used  Substance and Sexual Activity  . Alcohol use: Yes    Comment: occasionally  . Drug use: No  . Sexual activity: Not Currently    Birth control/protection: None  Lifestyle  . Physical activity    Days per week: Not on file    Minutes per session: Not on file  . Stress: Not on file  Relationships  . Social Herbalist on phone: Not on file    Gets together: Not on file    Attends religious service: Not on file    Active member of club or organization: Not on file    Attends meetings of clubs or organizations: Not on file    Relationship status: Not on file  . Intimate partner violence    Fear of current or ex partner: Not on file     Emotionally abused: Not on file    Physically abused: Not on file    Forced sexual activity: Not on file  Other Topics Concern  . Not on file  Social History Narrative  . Not on file     ROS- All systems are reviewed and negative except as per the HPI above.  Physical Exam: Vitals:   07/25/19 1027  BP: 138/70  Pulse: (!) 47  Weight: 81.3 kg  Height: 5\' 6"  (1.676 m)    GEN- The patient is well appearing, alert and oriented x 3 today.   HEENT-head normocephalic, atraumatic, sclera clear, conjunctiva  pink, hearing intact, trachea midline. Lungs- Clear to ausculation bilaterally, normal work of breathing Heart- Regular rate and rhythm, no murmurs, rubs or gallops  GI- soft, NT, ND, + BS Extremities- no clubbing, cyanosis, or edema MS- no significant deformity or atrophy Skin- no rash or lesion Psych- euthymic mood, full affect Neuro- strength and sensation are intact   Wt Readings from Last 3 Encounters:  07/25/19 81.3 kg  07/18/19 80.2 kg  07/15/19 80.7 kg    EKG today demonstrates SB HR 47, PR 178, QRS 76, QTc 454  Echo 06/18/19 demonstrated  1. Moderate hypokinesis of the left ventricular, mid-apical inferior wall.  2. The left ventricle has normal systolic function, with an ejection fraction of 55-60%. The cavity size was normal. There is moderately increased left ventricular wall thickness. Left ventricular diastolic Doppler parameters are indeterminate.  3. The right ventricle has normal systolic function. The cavity was normal. There is no increase in right ventricular wall thickness.  4. Left atrial size was mildly dilated.  5. The aortic valve is tricuspid. Mild thickening of the aortic valve. Mild calcification of the aortic valve. Aortic valve regurgitation is trivial by color flow Doppler. Moderate stenosis of the aortic valve.  6. Peak velocity 3.34m/s, mean gradient 75mmHg.  Epic records are reviewed at length today  Assessment and Plan:  1. Persistent  atrial fibrillation Patient s/p dofetilide loading. Appears to be maintaining SR. She is bradycardic today with transient lightheadedness.  Will stop digoxin. Continue dofetilide 250 mcg BID. QT stable. Continue metoprolol 100 mg BID. Will consider decreasing this if bradycardia persists.  Continue Eliquis 5 mg BID Check Bmet/mag today  This patients CHA2DS2-VASc Score and unadjusted Ischemic Stroke Rate (% per year) is equal to 4.8 % stroke rate/year from a score of 4  Above score calculated as 1 point each if present [CHF, HTN, DM, Vascular=MI/PAD/Aortic Plaque, Age if 65-74, or Female] Above score calculated as 2 points each if present [Age > 75, or Stroke/TIA/TE]   2. Snoring The importance of adequate treatment of sleep apnea was discussed today in order to improve our ability to maintain sinus rhythm long term. Patient having symptoms of snoring. Will arrange for sleep study.  4. Cardiomyopathy  EF appears to have recovered, 55-60% on recent echo. Continue present therapy.  5. HTN Stable, no changes today.   Follow up in AF clinic in 1 month.   [facility] [address] [place], [place] [address] [phone] 07/25/2019 10:54 AM

## 2019-08-19 ENCOUNTER — Ambulatory Visit (HOSPITAL_COMMUNITY): Payer: Medicare Other | Admitting: Nurse Practitioner

## 2019-08-19 ENCOUNTER — Telehealth: Payer: Self-pay | Admitting: *Deleted

## 2019-08-19 NOTE — Telephone Encounter (Signed)
Staff message sent to Tracy James ok to schedule sleep study. Per [facility] web portal no PA is required.

## 2019-08-21 ENCOUNTER — Telehealth: Payer: Self-pay | Admitting: *Deleted

## 2019-08-21 NOTE — Telephone Encounter (Signed)
-----   Message from Lauralee Evener, [place] sent at 08/19/2019  2:06 PM EDT ----- Regarding: RE: sleep study Ok to schedule sleep study. Per [facility] web portal no PA is required. Decision [id number]. ----- Message ----- From: Juluis Mire, RN Sent: 07/25/2019  11:37 AM EDT To: [facility] Sleep Studies Subject: sleep study                                    Pt needs sleep study for snoring, afib per clint fenton orders in epic Thanks [place]

## 2019-08-22 ENCOUNTER — Ambulatory Visit (HOSPITAL_COMMUNITY)
Admission: RE | Admit: 2019-08-22 | Discharge: 2019-08-22 | Disposition: A | Discharge status: Home / Self Care | Payer: Medicare Other | Source: Ambulatory Visit | Attending: Nurse Practitioner | Admitting: Nurse Practitioner

## 2019-08-22 ENCOUNTER — Other Ambulatory Visit: Payer: Self-pay

## 2019-08-22 VITALS — BP 136/66 | HR 67 | Ht 66.0 in | Wt 182.2 lb

## 2019-08-22 DIAGNOSIS — I10 Essential (primary) hypertension: Secondary | ICD-10-CM | POA: Insufficient documentation

## 2019-08-22 DIAGNOSIS — Z800 Family history of malignant neoplasm of digestive organs: Secondary | ICD-10-CM | POA: Insufficient documentation

## 2019-08-22 DIAGNOSIS — Z833 Family history of diabetes mellitus: Secondary | ICD-10-CM | POA: Insufficient documentation

## 2019-08-22 DIAGNOSIS — I4891 Unspecified atrial fibrillation: Secondary | ICD-10-CM | POA: Diagnosis present

## 2019-08-22 DIAGNOSIS — Z7901 Long term (current) use of anticoagulants: Secondary | ICD-10-CM | POA: Insufficient documentation

## 2019-08-22 DIAGNOSIS — I429 Cardiomyopathy, unspecified: Secondary | ICD-10-CM | POA: Diagnosis not present

## 2019-08-22 DIAGNOSIS — Z825 Family history of asthma and other chronic lower respiratory diseases: Secondary | ICD-10-CM | POA: Insufficient documentation

## 2019-08-22 DIAGNOSIS — I4819 Other persistent atrial fibrillation: Secondary | ICD-10-CM | POA: Diagnosis not present

## 2019-08-22 DIAGNOSIS — Z8249 Family history of ischemic heart disease and other diseases of the circulatory system: Secondary | ICD-10-CM | POA: Diagnosis not present

## 2019-08-22 DIAGNOSIS — Z87891 Personal history of nicotine dependence: Secondary | ICD-10-CM | POA: Insufficient documentation

## 2019-08-22 DIAGNOSIS — R0683 Snoring: Secondary | ICD-10-CM | POA: Diagnosis not present

## 2019-08-22 DIAGNOSIS — Z8261 Family history of arthritis: Secondary | ICD-10-CM | POA: Insufficient documentation

## 2019-08-22 DIAGNOSIS — Z79899 Other long term (current) drug therapy: Secondary | ICD-10-CM | POA: Diagnosis not present

## 2019-08-22 LAB — BASIC METABOLIC PANEL
Anion gap: 10 (ref 5–15)
BUN: 26 mg/dL — ABNORMAL HIGH (ref 8–23)
CO2: 24 mmol/L (ref 22–32)
Calcium: 9.3 mg/dL (ref 8.9–10.3)
Chloride: 101 mmol/L (ref 98–111)
Creatinine, Ser: 1.4 mg/dL — ABNORMAL HIGH (ref 0.44–1.00)
GFR calc Af Amer: 45 mL/min — ABNORMAL LOW (ref >60)
GFR calc non Af Amer: 39 mL/min — ABNORMAL LOW (ref >60)
Glucose, Bld: 89 mg/dL (ref 70–99)
Potassium: 5 mmol/L (ref 3.5–5.1)
Sodium: 135 mmol/L (ref 135–145)

## 2019-08-22 LAB — MAGNESIUM: Magnesium: 2.1 mg/dL (ref 1.7–2.4)

## 2019-08-22 NOTE — Telephone Encounter (Signed)
Patient cancelled her sleep study and states she/ HF clinic will call back in December to reschedule.

## 2019-08-22 NOTE — Progress Notes (Signed)
Primary Care Physician: Antony Blackbird, MD Primary Cardiologist: Ree Kida Primary Electrophysiologist: Dr Rayann Heman Referring Physician: Richardson Dopp PA-C   Tracy James is a 68 y.o. female with a history of persistent atrial fibrillation, tachycardia induced cardiomyopathy, AS, HTN, and tobacco abuse who presents for consultation in the [facility]. She was admitted in 11/2018 with AF with RVR. 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 Unfortunately, the TEE continued to show residual clot in the LA. Therefore, DCCV was not performed. She was seen infollow upon 02/2019 and her HR was uncontrolled. Unfortunately, repeat DCCV was not arranged due to COVID-19 restrictions (elective procedures on hold). She was placed on Digoxin for rate control. She was admitted 07/15/19-07/18/19 for dofetilide loading.  On follow up today, patient reports that she has done very well with no heart racing or palpitations. She is tolerating the medication without difficulty. She is in SR today.   Today, she denies symptoms of palpitations, chest pain, shortness of breath, orthopnea, PND, lower extremity edema, dizziness, presyncope, syncope, snoring, daytime somnolence, bleeding, or neurologic sequela. The patient is tolerating medications without difficulties and is otherwise without complaint today.    Atrial Fibrillation Risk Factors:  she does have symptoms of sleep apnea. she does not have a history of rheumatic fever. she does not have a history of alcohol use. The patient does not have a history of early familial atrial fibrillation or other arrhythmias.  she has a BMI of Body mass index is 29.41 kg/m.Marland Kitchen Filed Weights   08/22/19 1145  Weight: 82.6 kg    Family History  Problem Relation Age of Onset  . Arthritis Mother    . Cancer Mother        colon cancer  . Diabetes Mother   . Hypertension Mother   . Heart murmur Mother   . Cancer Brother        LUNG- smoking, Abestos  . Early death Brother   . Heart murmur Brother   . Arthritis Maternal Grandmother   . Heart disease Maternal Grandmother        massive heart attack  . Heart disease Paternal Grandmother   . Asthma Brother   . Diabetes Brother   . Hypertension Brother   . Heart murmur Brother   . Heart disease Father        anuersym- ruptered     Atrial Fibrillation Management history:  Previous antiarrhythmic drugs: none Previous cardioversions: none Previous ablations: none CHADS2VASC score: 4 Anticoagulation history: Eliquis   Past Medical History:  Diagnosis Date  . Allergy    SEASONAL  . Atrial fibrillation ([place])   . Heart murmur   . Hypertension    Past Surgical History:  Procedure Laterality Date  . TEE WITHOUT CARDIOVERSION N/A 12/06/2018   Procedure: TRANSESOPHAGEAL ECHOCARDIOGRAM (TEE);  Surgeon: Lelon Perla, MD;  Location: [facility] ENDOSCOPY;  Service: Cardiovascular;  Laterality: N/A;  . TEE WITHOUT CARDIOVERSION N/A 01/20/2019   Procedure: TRANSESOPHAGEAL ECHOCARDIOGRAM (TEE);  Surgeon: [name], MD;  Location: [facility] ENDOSCOPY;  Service: Cardiovascular;  Laterality: N/A;  . TONSILLECTOMY      Current Outpatient Medications  Medication Sig Dispense Refill  . acetaminophen (TYLENOL) 500 MG tablet Take 500 mg by mouth every 6 (six) hours as needed (for pain.).    Marland Kitchen apixaban (ELIQUIS) 5 MG TABS tablet Take 1 tablet (  5 mg total) by mouth every 12 (twelve) hours. 60 tablet 5  . Calcium 600-200 MG-UNIT tablet Take 1 tablet by mouth daily.    Marland Kitchen diltiazem (CARDIZEM CD) 300 MG 24 hr capsule Take 1 capsule (300 mg total) by mouth daily. 90 capsule 3  . dofetilide (TIKOSYN) 250 MCG capsule Take 1 capsule (250 mcg total) by mouth 2 (two) times daily. 60 capsule 6  . hydrALAZINE (APRESOLINE) 25 MG tablet Take 1 tablet (25  mg total) by mouth 3 (three) times daily. 90 tablet 11  . loratadine (CLARITIN) 10 MG tablet Take 10 mg by mouth daily as needed for allergies.    . metoprolol tartrate (LOPRESSOR) 50 MG tablet Take 2 tablets (100 mg total) by mouth 2 (two) times daily. 360 tablet 3  . nitroGLYCERIN (NITROSTAT) 0.4 MG SL tablet Place 1 tablet (0.4 mg total) under the tongue every 5 (five) minutes as needed for chest pain. 20 tablet 0  . Omega-3 1000 MG CAPS Take 1,000 mg by mouth daily.     Marland Kitchen VITAMIN D PO Take 1 tablet by mouth daily.    . furosemide (LASIX) 40 MG tablet Take 1 tablet (40 mg total) by mouth daily. 30 tablet 6   No current facility-administered medications for this encounter.     No Known Allergies  Social History   Socioeconomic History  . Marital status: Divorced    Spouse name: Not on file  . Number of children: Not on file  . Years of education: Not on file  . Highest education level: Not on file  Occupational History  . Not on file  Social Needs  . Financial resource strain: Not on file  . Food insecurity    Worry: Not on file    Inability: Not on file  . Transportation needs    Medical: Not on file    Non-medical: Not on file  Tobacco Use  . Smoking status: Former Smoker    Quit date: 12/05/2011    Years since quitting: 7.7  . Smokeless tobacco: Never Used  Substance and Sexual Activity  . Alcohol use: Yes    Comment: occasionally  . Drug use: No  . Sexual activity: Not Currently    Birth control/protection: None  Lifestyle  . Physical activity    Days per week: Not on file    Minutes per session: Not on file  . Stress: Not on file  Relationships  . Social Herbalist on phone: Not on file    Gets together: Not on file    Attends religious service: Not on file    Active member of club or organization: Not on file    Attends meetings of clubs or organizations: Not on file    Relationship status: Not on file  . Intimate partner violence    Fear of  current or ex partner: Not on file    Emotionally abused: Not on file    Physically abused: Not on file    Forced sexual activity: Not on file  Other Topics Concern  . Not on file  Social History Narrative  . Not on file     ROS- All systems are reviewed and negative except as per the HPI above.  Physical Exam: Vitals:   08/22/19 1145  BP: 136/66  Pulse: 67  Weight: 82.6 kg  Height: 5\' 6"  (1.676 m)    GEN- The patient is well appearing, alert and oriented x 3 today.   HEENT-head  normocephalic, atraumatic, sclera clear, conjunctiva pink, hearing intact, trachea midline. Lungs- Clear to ausculation bilaterally, normal work of breathing Heart- Regular rate and rhythm, no murmurs, rubs or gallops  GI- soft, NT, ND, + BS Extremities- no clubbing, cyanosis, or edema MS- no significant deformity or atrophy Skin- no rash or lesion Psych- euthymic mood, full affect Neuro- strength and sensation are intact   Wt Readings from Last 3 Encounters:  08/22/19 82.6 kg  07/25/19 81.3 kg  07/18/19 80.2 kg    EKG today demonstrates SR HR 67, old inf infarct (? LA/LL lead reversal), PR 188, QRS 66, QTc 475  Echo 06/18/19 demonstrated  1. Moderate hypokinesis of the left ventricular, mid-apical inferior wall.  2. The left ventricle has normal systolic function, with an ejection fraction of 55-60%. The cavity size was normal. There is moderately increased left ventricular wall thickness. Left ventricular diastolic Doppler parameters are indeterminate.  3. The right ventricle has normal systolic function. The cavity was normal. There is no increase in right ventricular wall thickness.  4. Left atrial size was mildly dilated.  5. The aortic valve is tricuspid. Mild thickening of the aortic valve. Mild calcification of the aortic valve. Aortic valve regurgitation is trivial by color flow Doppler. Moderate stenosis of the aortic valve.  6. Peak velocity 3.24m/s, mean gradient 79mmHg.  Epic  records are reviewed at length today  Assessment and Plan:  1. Persistent atrial fibrillation Patient s/p dofetilide loading. Appears to be maintaining SR.  Continue dofetilide 250 mcg BID. QT stable. Continue metoprolol 100 mg BID.  Continue Eliquis 5 mg BID Check Bmet/mag today  This patients CHA2DS2-VASc Score and unadjusted Ischemic Stroke Rate (% per year) is equal to 4.8 % stroke rate/year from a score of 4  Above score calculated as 1 point each if present [CHF, HTN, DM, Vascular=MI/PAD/Aortic Plaque, Age if 65-74, or Female] Above score calculated as 2 points each if present [Age > 75, or Stroke/TIA/TE]   2. Snoring The importance of adequate treatment of sleep apnea was discussed today in order to improve our ability to maintain sinus rhythm long term. Patient having symptoms of snoring. Sleep study scheduled for 09/01/19.  4. Cardiomyopathy  EF appears to have recovered, 55-60% on recent echo. No signs or symptoms of fluid overload.  5. HTN Stable, no changes today.    Follow up in the AF clinic in 3 months.   [facility] [address] [place], [place] [address] [phone] 08/22/2019 12:10 PM

## 2019-08-29 ENCOUNTER — Other Ambulatory Visit (HOSPITAL_COMMUNITY): Payer: Medicare Other

## 2019-08-29 ENCOUNTER — Other Ambulatory Visit: Payer: Self-pay | Admitting: Physician Assistant

## 2019-09-01 ENCOUNTER — Encounter (HOSPITAL_BASED_OUTPATIENT_CLINIC_OR_DEPARTMENT_OTHER): Payer: Medicare Other | Admitting: Cardiology

## 2019-11-07 ENCOUNTER — Other Ambulatory Visit: Payer: Self-pay | Admitting: Family Medicine

## 2019-11-07 ENCOUNTER — Encounter: Payer: Self-pay | Admitting: Family Medicine

## 2019-11-07 ENCOUNTER — Other Ambulatory Visit: Payer: Self-pay

## 2019-11-07 ENCOUNTER — Ambulatory Visit: Payer: Medicare Other | Attending: Family Medicine | Admitting: Family Medicine

## 2019-11-07 VITALS — BP 130/78 | HR 59 | Temp 98.2°F | Resp 18 | Ht 66.0 in | Wt 190.0 lb

## 2019-11-07 DIAGNOSIS — Z1231 Encounter for screening mammogram for malignant neoplasm of breast: Secondary | ICD-10-CM | POA: Diagnosis not present

## 2019-11-07 DIAGNOSIS — Z87898 Personal history of other specified conditions: Secondary | ICD-10-CM

## 2019-11-07 DIAGNOSIS — Z0000 Encounter for general adult medical examination without abnormal findings: Secondary | ICD-10-CM

## 2019-11-07 DIAGNOSIS — I4891 Unspecified atrial fibrillation: Secondary | ICD-10-CM

## 2019-11-07 DIAGNOSIS — Z7901 Long term (current) use of anticoagulants: Secondary | ICD-10-CM

## 2019-11-07 DIAGNOSIS — E785 Hyperlipidemia, unspecified: Secondary | ICD-10-CM

## 2019-11-07 DIAGNOSIS — Z79899 Other long term (current) drug therapy: Secondary | ICD-10-CM

## 2019-11-07 DIAGNOSIS — Z1211 Encounter for screening for malignant neoplasm of colon: Secondary | ICD-10-CM | POA: Diagnosis not present

## 2019-11-07 NOTE — Progress Notes (Signed)
Patient ID: Tracy James, female   DOB: 01-18-1951, 68 y.o.   MRN: [id number]  Patient here for annual well exam, fasting, and will have lab visit for labs in follow-up of chronic medical issues.

## 2019-11-07 NOTE — Progress Notes (Signed)
Established Patient Office Visit  Subjective:  Patient ID: Tracy James, female    DOB: Aug 17, 1951  Age: 68 y.o. MRN: [id number]  CC:  Chief Complaint  Patient presents with  . Annual Exam    HPI Tracy James, 68 yo female, who presents for annual well exam. She had normal pap in 2019 and normal mammogram in 11/2018.  She has received letter from the breast center regarding scheduling for mammogram for this year.  She has found no abnormalities on self breast exam.  She denies any issues with pelvic pain or vaginal discharge and no postmenopausal bleeding.  She is currently on long-term anticoagulation due to atrial fibrillation and history of atrial thrombus.  She denies any unusual bruising or bleeding.  She states that she was told by her cardiologist that she may not be able to have a colonoscopy as she would not be able to be put to sleep because of her medical issues.  She agrees to do fecal occult blood testing.  Patient reports that she has never had an influenza immunization therefore she does not wish to have one today.  She states that overall she feels well and is having no current issues.  Past Medical History:  Diagnosis Date  . Allergy    SEASONAL  . Atrial fibrillation ([place])   . Heart murmur   . Hypertension     Past Surgical History:  Procedure Laterality Date  . TEE WITHOUT CARDIOVERSION N/A 12/06/2018   Procedure: TRANSESOPHAGEAL ECHOCARDIOGRAM (TEE);  Surgeon: Lelon Perla, MD;  Location: [facility] ENDOSCOPY;  Service: Cardiovascular;  Laterality: N/A;  . TEE WITHOUT CARDIOVERSION N/A 01/20/2019   Procedure: TRANSESOPHAGEAL ECHOCARDIOGRAM (TEE);  Surgeon: [name], MD;  Location: [facility] ENDOSCOPY;  Service: Cardiovascular;  Laterality: N/A;  . TONSILLECTOMY      Family History  Problem Relation Age of Onset  . Arthritis Mother   . Cancer Mother        colon cancer  . Diabetes Mother   . Hypertension Mother   . Heart murmur Mother   . Cancer Brother         LUNG- smoking, Abestos  . Early death Brother   . Heart murmur Brother   . Arthritis Maternal Grandmother   . Heart disease Maternal Grandmother        massive heart attack  . Heart disease Paternal Grandmother   . Asthma Brother   . Diabetes Brother   . Hypertension Brother   . Heart murmur Brother   . Heart disease Father        anuersym- ruptered    Social History   Socioeconomic History  . Marital status: Divorced    Spouse name: Not on file  . Number of children: Not on file  . Years of education: Not on file  . Highest education level: Not on file  Occupational History  . Not on file  Social Needs  . Financial resource strain: Not on file  . Food insecurity    Worry: Not on file    Inability: Not on file  . Transportation needs    Medical: Not on file    Non-medical: Not on file  Tobacco Use  . Smoking status: Former Smoker    Quit date: 12/05/2011    Years since quitting: 7.9  . Smokeless tobacco: Never Used  Substance and Sexual Activity  . Alcohol use: Yes    Comment: occasionally  . Drug use: No  . Sexual activity:  Not Currently    Birth control/protection: None  Lifestyle  . Physical activity    Days per week: Not on file    Minutes per session: Not on file  . Stress: Not on file  Relationships  . Social Herbalist on phone: Not on file    Gets together: Not on file    Attends religious service: Not on file    Active member of club or organization: Not on file    Attends meetings of clubs or organizations: Not on file    Relationship status: Not on file  . Intimate partner violence    Fear of current or ex partner: Not on file    Emotionally abused: Not on file    Physically abused: Not on file    Forced sexual activity: Not on file  Other Topics Concern  . Not on file  Social History Narrative  . Not on file    Outpatient Medications Prior to Visit  Medication Sig Dispense Refill  . acetaminophen (TYLENOL) 500 MG tablet  Take 500 mg by mouth every 6 (six) hours as needed (for pain.).    Marland Kitchen apixaban (ELIQUIS) 5 MG TABS tablet Take 1 tablet (5 mg total) by mouth every 12 (twelve) hours. 60 tablet 5  . Calcium 600-200 MG-UNIT tablet Take 1 tablet by mouth daily.    Marland Kitchen diltiazem (CARDIZEM CD) 300 MG 24 hr capsule Take 1 capsule (300 mg total) by mouth daily. 90 capsule 3  . dofetilide (TIKOSYN) 250 MCG capsule Take 1 capsule (250 mcg total) by mouth 2 (two) times daily. 60 capsule 6  . furosemide (LASIX) 40 MG tablet Take 1 tablet by mouth once daily 90 tablet 2  . hydrALAZINE (APRESOLINE) 25 MG tablet Take 1 tablet (25 mg total) by mouth 3 (three) times daily. 90 tablet 11  . loratadine (CLARITIN) 10 MG tablet Take 10 mg by mouth daily as needed for allergies.    . metoprolol tartrate (LOPRESSOR) 50 MG tablet Take 2 tablets (100 mg total) by mouth 2 (two) times daily. 360 tablet 3  . nitroGLYCERIN (NITROSTAT) 0.4 MG SL tablet Place 1 tablet (0.4 mg total) under the tongue every 5 (five) minutes as needed for chest pain. 20 tablet 0  . Omega-3 1000 MG CAPS Take 1,000 mg by mouth daily.     Marland Kitchen VITAMIN D PO Take 1 tablet by mouth daily.     No facility-administered medications prior to visit.     No Known Allergies  ROS Review of Systems  Constitutional: Negative for chills, fatigue and fever.  HENT: Negative for congestion, hearing loss, nosebleeds, sore throat and trouble swallowing.   Eyes: Negative for photophobia and visual disturbance.  Respiratory: Negative for cough and shortness of breath.   Cardiovascular: Negative for chest pain, palpitations and leg swelling.  Gastrointestinal: Negative for abdominal pain, blood in stool, constipation, diarrhea and nausea.  Endocrine: Negative for cold intolerance, heat intolerance, polydipsia, polyphagia and polyuria.  Genitourinary: Positive for frequency (Occurs after taking Lasix). Negative for difficulty urinating and dysuria.  Musculoskeletal: Negative for  arthralgias and back pain.  Skin: Negative for rash and wound.  Neurological: Negative for dizziness and headaches.  Hematological: Negative for adenopathy. Does not bruise/bleed easily.  Psychiatric/Behavioral: Negative for self-injury and suicidal ideas.      Objective:    Physical Exam  Constitutional: She is oriented to person, place, and time. She appears well-developed and well-nourished.  Cardiovascular: Normal rate and regular rhythm.  Murmur heard. Patient with somewhat distant sounding heart sounds but appears to be in normal sinus rhythm with normal rate.  Patient with mild holosystolic murmur with radiation to the left carotid  Pulmonary/Chest: Effort normal and breath sounds normal.  Abdominal: Soft. There is no abdominal tenderness. There is no rebound and no guarding.  Genitourinary:    Genitourinary Comments: Declines breast exam; has had normal Pap smear and has no current GU issues therefore no pelvic exam performed   Musculoskeletal:        General: No tenderness or edema.  Neurological: She is alert and oriented to person, place, and time. No cranial nerve deficit.  Skin: Skin is warm and dry. No rash noted.  Psychiatric: She has a normal mood and affect. Her behavior is normal.  Nursing note and vitals reviewed.   BP 130/78 (BP Location: Left Arm, Patient Position: Sitting, Cuff Size: Normal)   Pulse (!) 59   Temp 98.2 F (36.8 C) (Oral)   Resp 18   Ht 5\' 6"  (1.676 m)   Wt 190 lb (86.2 kg)   SpO2 100%   BMI 30.67 kg/m  Wt Readings from Last 3 Encounters:  11/07/19 190 lb (86.2 kg)  08/22/19 182 lb 3.2 oz (82.6 kg)  07/25/19 179 lb 3.2 oz (81.3 kg)     Health Maintenance Due  Topic Date Due  . COLONOSCOPY  07/10/2016   Patient was offered but declined influenza immunization at today's visit.   Lab Results  Component Value Date   TSH 0.991 12/03/2018   Lab Results  Component Value Date   WBC 9.3 01/17/2019   HGB 15.1 01/17/2019   HCT 45.4  01/17/2019   MCV 90 01/17/2019   PLT 326 01/17/2019   Lab Results  Component Value Date   NA 135 08/22/2019   K 5.0 08/22/2019   CO2 24 08/22/2019   GLUCOSE 89 08/22/2019   BUN 26 (H) 08/22/2019   CREATININE 1.40 (H) 08/22/2019   BILITOT 0.4 10/04/2018   ALKPHOS 52 10/04/2018   AST 13 10/04/2018   ALT 11 10/04/2018   PROT 6.7 10/04/2018   ALBUMIN 4.0 10/04/2018   CALCIUM 9.3 08/22/2019   ANIONGAP 10 08/22/2019   Lab Results  Component Value Date   CHOL 139 01/15/2019   Lab Results  Component Value Date   HDL 40 01/15/2019   Lab Results  Component Value Date   LDLCALC 70 01/15/2019   Lab Results  Component Value Date   TRIG 147 01/15/2019   Lab Results  Component Value Date   CHOLHDL 3.5 01/15/2019   Lab Results  Component Value Date   HGBA1C 5.6 08/09/2018   HGBA1C 5.6 08/09/2018   HGBA1C 5.6 (A) 08/09/2018   HGBA1C 5.6 08/09/2018      Assessment & Plan:  1. Screening for colon cancer She agrees to do fecal occult blood testing as a screening for colon cancer.  Patient however is on anticoagulation therefore there is a good chance that her test will be positive.  Discussed with the patient that there were ways to screen for colon cancer by gastroenterology without having to put her to sleep as she states that she was told by her cardiologist that she likely could not have a colonoscopy for that reason.  Patient will be notified of the fecal occult blood results and if any additional follow-up is needed. - Fecal occult blood, imunochemical([facility])  2. Well adult exam Patient with up-to-date mammogram and Pap smear.  She  will be referred for upcoming mammogram and she was also made aware that since she has received a letter from the breast center, she can call to schedule the appointment as well.  Patient is given preventative care information as part of her after visit summary.  Patient was offered but declined influenza immunization.  She is to  continue healthy diet along with cardiovascular exercise. - MM Digital Screening; Future  3. Encounter for screening mammogram for malignant neoplasm of breast Order placed for screening mammogram.  Most recent mammogram was done in December 2019 and was normal.  Patient is encouraged to do monthly self breast exams and report any abnormalities. - MM Digital Screening; Future  An After Visit Summary was printed and given to the patient.  Follow-up: Return in about 6 months (around 05/07/2020) for chronic issues and as needed.   Antony Blackbird, MD

## 2019-11-07 NOTE — Patient Instructions (Signed)
Health Maintenance, Female Adopting a healthy lifestyle and getting preventive care are important in promoting health and wellness. Ask your health care provider about:  The right schedule for you to have regular tests and exams.  Things you can do on your own to prevent diseases and keep yourself healthy. What should I know about diet, weight, and exercise? Eat a healthy diet   Eat a diet that includes plenty of vegetables, fruits, low-fat dairy products, and lean protein.  Do not eat a lot of foods that are high in solid fats, added sugars, or sodium. Maintain a healthy weight Body mass index (BMI) is used to identify weight problems. It estimates body fat based on height and weight. Your health care provider can help determine your BMI and help you achieve or maintain a healthy weight. Get regular exercise Get regular exercise. This is one of the most important things you can do for your health. Most adults should:  Exercise for at least 150 minutes each week. The exercise should increase your heart rate and make you sweat (moderate-intensity exercise).  Do strengthening exercises at least twice a week. This is in addition to the moderate-intensity exercise.  Spend less time sitting. Even light physical activity can be beneficial. Watch cholesterol and blood lipids Have your blood tested for lipids and cholesterol at 66 years of age, then have this test every 5 years. Have your cholesterol levels checked more often if:  Your lipid or cholesterol levels are high.  You are older than 66 years of age.  You are at high risk for heart disease. What should I know about cancer screening? Depending on your health history and family history, you may need to have cancer screening at various ages. This may include screening for:  Breast cancer.  Cervical cancer.  Colorectal cancer.  Skin cancer.  Lung cancer. What should I know about heart disease, diabetes, and high blood  pressure? Blood pressure and heart disease  High blood pressure causes heart disease and increases the risk of stroke. This is more likely to develop in people who have high blood pressure readings, are of African descent, or are overweight.  Have your blood pressure checked: ? Every 3-5 years if you are 18-39 years of age. ? Every year if you are 40 years old or older. Diabetes Have regular diabetes screenings. This checks your fasting blood sugar level. Have the screening done:  Once every three years after age 40 if you are at a normal weight and have a low risk for diabetes.  More often and at a younger age if you are overweight or have a high risk for diabetes. What should I know about preventing infection? Hepatitis B If you have a higher risk for hepatitis B, you should be screened for this virus. Talk with your health care provider to find out if you are at risk for hepatitis B infection. Hepatitis C Testing is recommended for:  Everyone born from 1945 through 1965.  Anyone with known risk factors for hepatitis C. Sexually transmitted infections (STIs)  Get screened for STIs, including gonorrhea and chlamydia, if: ? You are sexually active and are younger than 66 years of age. ? You are older than 66 years of age and your health care provider tells you that you are at risk for this type of infection. ? Your sexual activity has changed since you were last screened, and you are at increased risk for chlamydia or gonorrhea. Ask your health care provider if   you are at risk.  Ask your health care provider about whether you are at high risk for HIV. Your health care provider may recommend a prescription medicine to help prevent HIV infection. If you choose to take medicine to prevent HIV, you should first get tested for HIV. You should then be tested every 3 months for as long as you are taking the medicine. Pregnancy  If you are about to stop having your period (premenopausal) and  you may become pregnant, seek counseling before you get pregnant.  Take 400 to 800 micrograms (mcg) of folic acid every day if you become pregnant.  Ask for birth control (contraception) if you want to prevent pregnancy. Osteoporosis and menopause Osteoporosis is a disease in which the bones lose minerals and strength with aging. This can result in bone fractures. If you are 68 years old or older, or if you are at risk for osteoporosis and fractures, ask your health care provider if you should:  Be screened for bone loss.  Take a calcium or vitamin D supplement to lower your risk of fractures.  Be given hormone replacement therapy (HRT) to treat symptoms of menopause. Follow these instructions at home: Lifestyle  Do not use any products that contain nicotine or tobacco, such as cigarettes, e-cigarettes, and chewing tobacco. If you need help quitting, ask your health care provider.  Do not use street drugs.  Do not share needles.  Ask your health care provider for help if you need support or information about quitting drugs. Alcohol use  Do not drink alcohol if: ? Your health care provider tells you not to drink. ? You are pregnant, may be pregnant, or are planning to become pregnant.  If you drink alcohol: ? Limit how much you use to 0-1 drink a day. ? Limit intake if you are breastfeeding.  Be aware of how much alcohol is in your drink. In the [place] one drink equals one 12 oz bottle of beer (355 mL), one 5 oz glass of wine (148 mL), or one 1 oz glass of hard liquor (44 mL). General instructions  Schedule regular health, dental, and eye exams.  Stay current with your vaccines.  Tell your health care provider if: ? You often feel depressed. ? You have ever been abused or do not feel safe at home. Summary  Adopting a healthy lifestyle and getting preventive care are important in promoting health and wellness.  Follow your health care provider's instructions about healthy  diet, exercising, and getting tested or screened for diseases.  Follow your health care provider's instructions on monitoring your cholesterol and blood pressure. This information is not intended to replace advice given to you by your health care provider. Make sure you discuss any questions you have with your health care provider. Document Released: 06/05/2011 Document Revised: 11/13/2018 Document Reviewed: 11/13/2018 Elsevier Patient Education  2020 [employer] Maintenance After Age 37 After age 66, you are at a higher risk for certain long-term diseases and infections as well as injuries from falls. Falls are a major cause of broken bones and head injuries in people who are older than age 39. Getting regular preventive care can help to keep you healthy and well. Preventive care includes getting regular testing and making lifestyle changes as recommended by your health care provider. Talk with your health care provider about:  Which screenings and tests you should have. A screening is a test that checks for a disease when you have no symptoms.  A diet and exercise plan that is right for you. What should I know about screenings and tests to prevent falls? Screening and testing are the best ways to find a health problem early. Early diagnosis and treatment give you the best chance of managing medical conditions that are common after age 38. Certain conditions and lifestyle choices may make you more likely to have a fall. Your health care provider may recommend:  Regular vision checks. Poor vision and conditions such as cataracts can make you more likely to have a fall. If you wear glasses, make sure to get your prescription updated if your vision changes.  Medicine review. Work with your health care provider to regularly review all of the medicines you are taking, including over-the-counter medicines. Ask your health care provider about any side effects that may make you more likely to  have a fall. Tell your health care provider if any medicines that you take make you feel dizzy or sleepy.  Osteoporosis screening. Osteoporosis is a condition that causes the bones to get weaker. This can make the bones weak and cause them to break more easily.  Blood pressure screening. Blood pressure changes and medicines to control blood pressure can make you feel dizzy.  Strength and balance checks. Your health care provider may recommend certain tests to check your strength and balance while standing, walking, or changing positions.  Foot health exam. Foot pain and numbness, as well as not wearing proper footwear, can make you more likely to have a fall.  Depression screening. You may be more likely to have a fall if you have a fear of falling, feel emotionally low, or feel unable to do activities that you used to do.  Alcohol use screening. Using too much alcohol can affect your balance and may make you more likely to have a fall. What actions can I take to lower my risk of falls? General instructions  Talk with your health care provider about your risks for falling. Tell your health care provider if: ? You fall. Be sure to tell your health care provider about all falls, even ones that seem minor. ? You feel dizzy, sleepy, or off-balance.  Take over-the-counter and prescription medicines only as told by your health care provider. These include any supplements.  Eat a healthy diet and maintain a healthy weight. A healthy diet includes low-fat dairy products, low-fat (lean) meats, and fiber from whole grains, beans, and lots of fruits and vegetables. Home safety  Remove any tripping hazards, such as rugs, cords, and clutter.  Install safety equipment such as grab bars in bathrooms and safety rails on stairs.  Keep rooms and walkways well-lit. Activity   Follow a regular exercise program to stay fit. This will help you maintain your balance. Ask your health care provider what  types of exercise are appropriate for you.  If you need a cane or walker, use it as recommended by your health care provider.  Wear supportive shoes that have nonskid soles. Lifestyle  Do not drink alcohol if your health care provider tells you not to drink.  If you drink alcohol, limit how much you have: ? 0-1 drink a day for women. ? 0-2 drinks a day for men.  Be aware of how much alcohol is in your drink. In the [place] one drink equals one typical bottle of beer (12 oz), one-half glass of wine (5 oz), or one shot of hard liquor (1 oz).  Do not use any products that contain  nicotine or tobacco, such as cigarettes and e-cigarettes. If you need help quitting, ask your health care provider. Summary  Having a healthy lifestyle and getting preventive care can help to protect your health and wellness after age 28.  Screening and testing are the best way to find a health problem early and help you avoid having a fall. Early diagnosis and treatment give you the best chance for managing medical conditions that are more common for people who are older than age 107.  Falls are a major cause of broken bones and head injuries in people who are older than age 50. Take precautions to prevent a fall at home.  Work with your health care provider to learn what changes you can make to improve your health and wellness and to prevent falls. This information is not intended to replace advice given to you by your health care provider. Make sure you discuss any questions you have with your health care provider. Document Released: 10/03/2017 Document Revised: 03/13/2019 Document Reviewed: 10/03/2017 Elsevier Patient Education  2020 [employer].

## 2019-11-08 LAB — COMPREHENSIVE METABOLIC PANEL WITH GFR
ALT: 23 IU/L (ref 0–32)
AST: 25 IU/L (ref 0–40)
Albumin/Globulin Ratio: 1.6 (ref 1.2–2.2)
Albumin: 4.2 g/dL (ref 3.8–4.8)
Alkaline Phosphatase: 85 IU/L (ref 39–117)
BUN/Creatinine Ratio: 15 (ref 12–28)
BUN: 19 mg/dL (ref 8–27)
Bilirubin Total: 0.5 mg/dL (ref 0.0–1.2)
CO2: 24 mmol/L (ref 20–29)
Calcium: 9.7 mg/dL (ref 8.7–10.3)
Chloride: 101 mmol/L (ref 96–106)
Creatinine, Ser: 1.27 mg/dL — ABNORMAL HIGH (ref 0.57–1.00)
GFR calc Af Amer: 51 mL/min/1.73 — ABNORMAL LOW
GFR calc non Af Amer: 44 mL/min/1.73 — ABNORMAL LOW
Globulin, Total: 2.6 g/dL (ref 1.5–4.5)
Glucose: 102 mg/dL — ABNORMAL HIGH (ref 65–99)
Potassium: 4.4 mmol/L (ref 3.5–5.2)
Sodium: 140 mmol/L (ref 134–144)
Total Protein: 6.8 g/dL (ref 6.0–8.5)

## 2019-11-08 LAB — CBC
Hematocrit: 38.9 % (ref 34.0–46.6)
Hemoglobin: 13.1 g/dL (ref 11.1–15.9)
MCH: 29.6 pg (ref 26.6–33.0)
MCHC: 33.7 g/dL (ref 31.5–35.7)
MCV: 88 fL (ref 79–97)
Platelets: 262 x10E3/uL (ref 150–450)
RBC: 4.42 x10E6/uL (ref 3.77–5.28)
RDW: 13.2 % (ref 11.7–15.4)
WBC: 8.4 x10E3/uL (ref 3.4–10.8)

## 2019-11-08 LAB — LIPID PANEL
Chol/HDL Ratio: 4.1 ratio (ref 0.0–4.4)
Cholesterol, Total: 197 mg/dL (ref 100–199)
HDL: 48 mg/dL
LDL Chol Calc (NIH): 126 mg/dL — ABNORMAL HIGH (ref 0–99)
Triglycerides: 128 mg/dL (ref 0–149)
VLDL Cholesterol Cal: 23 mg/dL (ref 5–40)

## 2019-11-08 LAB — HEMOGLOBIN A1C
Est. average glucose Bld gHb Est-mCnc: 126 mg/dL
Hgb A1c MFr Bld: 6 % — ABNORMAL HIGH (ref 4.8–5.6)

## 2019-11-19 ENCOUNTER — Telehealth: Payer: Self-pay | Admitting: *Deleted

## 2019-11-19 NOTE — Telephone Encounter (Signed)
-----   Message from Antony Blackbird, MD sent at 11/10/2019 10:12 AM EST ----- Normal complete blood count.  Hemoglobin A1c of 6.0 which is consistent with prediabetes and hemoglobin A1c of 6.5 or higher is consistent with diabetes.  Please ask that patient consider starting use of metformin to help with insulin resistance/blood sugar control.  She should focus on a diet that is low in carbohydrates and to try and eliminate concentrated sweets such as soda/sweet tea, cookies/pies/cake/ice cream as well as reduce or limit bread, pasta, potatoes or rice and or have whole-wheat bread, whole wheat pasta, sweet potatoes or brown rice/whole-grain rice but her meals consist mostly of lean meats, and vegetables.  Schedule follow-up in 3 to 4 months and as needed.  Comprehensive metabolic panel with glucose of 102 which is slightly above goal of 100 or less if fasting.  Creatinine with mild increase at 1.27 which is improved from creatinine of 1.40 on 08/22/2019.  Continue to control blood pressure and remain well-hydrated and avoid the use of nonsteroidal anti-inflammatories.  Normal liver enzymes.  Labs also forwarded to patient's cardiology provider.

## 2019-11-19 NOTE — Telephone Encounter (Signed)
Medical Assistant left message on patient's home and cell voicemail. Patient is aware of 6.0 being prediabetic and needing to limit sugar intake. Patient is also aware of creatinine level improving and needing to continue with blood pressure control. Liver enzymes was normal and patient should still avoid OTC Advil and ibuprofen.

## 2019-11-21 ENCOUNTER — Ambulatory Visit (HOSPITAL_COMMUNITY): Payer: Medicare Other | Admitting: Physician Assistant

## 2019-12-02 ENCOUNTER — Ambulatory Visit (HOSPITAL_COMMUNITY)
Admission: RE | Admit: 2019-12-02 | Discharge: 2019-12-02 | Disposition: A | Discharge status: Home / Self Care | Payer: Medicare Other | Source: Ambulatory Visit | Attending: Physician Assistant | Admitting: Physician Assistant

## 2019-12-02 ENCOUNTER — Other Ambulatory Visit: Payer: Self-pay

## 2019-12-02 VITALS — BP 132/58 | HR 51 | Ht 66.0 in | Wt 191.0 lb

## 2019-12-02 DIAGNOSIS — I10 Essential (primary) hypertension: Secondary | ICD-10-CM | POA: Diagnosis not present

## 2019-12-02 DIAGNOSIS — Z801 Family history of malignant neoplasm of trachea, bronchus and lung: Secondary | ICD-10-CM | POA: Insufficient documentation

## 2019-12-02 DIAGNOSIS — Z87891 Personal history of nicotine dependence: Secondary | ICD-10-CM | POA: Diagnosis not present

## 2019-12-02 DIAGNOSIS — Z8249 Family history of ischemic heart disease and other diseases of the circulatory system: Secondary | ICD-10-CM | POA: Insufficient documentation

## 2019-12-02 DIAGNOSIS — Z7901 Long term (current) use of anticoagulants: Secondary | ICD-10-CM | POA: Insufficient documentation

## 2019-12-02 DIAGNOSIS — D6869 Other thrombophilia: Secondary | ICD-10-CM | POA: Diagnosis not present

## 2019-12-02 DIAGNOSIS — Z800 Family history of malignant neoplasm of digestive organs: Secondary | ICD-10-CM | POA: Insufficient documentation

## 2019-12-02 DIAGNOSIS — Z8261 Family history of arthritis: Secondary | ICD-10-CM | POA: Diagnosis not present

## 2019-12-02 DIAGNOSIS — I428 Other cardiomyopathies: Secondary | ICD-10-CM | POA: Diagnosis not present

## 2019-12-02 DIAGNOSIS — Z833 Family history of diabetes mellitus: Secondary | ICD-10-CM | POA: Diagnosis not present

## 2019-12-02 DIAGNOSIS — Z79899 Other long term (current) drug therapy: Secondary | ICD-10-CM | POA: Diagnosis not present

## 2019-12-02 DIAGNOSIS — Z825 Family history of asthma and other chronic lower respiratory diseases: Secondary | ICD-10-CM | POA: Insufficient documentation

## 2019-12-02 DIAGNOSIS — I4891 Unspecified atrial fibrillation: Secondary | ICD-10-CM | POA: Diagnosis present

## 2019-12-02 DIAGNOSIS — I4819 Other persistent atrial fibrillation: Secondary | ICD-10-CM | POA: Insufficient documentation

## 2019-12-02 LAB — MAGNESIUM: Magnesium: 2.1 mg/dL (ref 1.7–2.4)

## 2019-12-02 MED ORDER — METOPROLOL TARTRATE 50 MG PO TABS: ORAL_TABLET | ORAL | 3 refills | Status: DC

## 2019-12-02 NOTE — Patient Instructions (Signed)
Decrease Metoprolol 50mg  to 1 tablet in the am and 1 tablet in the pm

## 2019-12-02 NOTE — Progress Notes (Signed)
Primary Care Physician: Antony Blackbird, MD Primary Cardiologist: Dr Burt Knack Primary Electrophysiologist: Dr Rayann Heman Referring Physician: Richardson Dopp PA-C   Tracy James is a 68 y.o. female with a history of persistent atrial fibrillation, tachycardia induced cardiomyopathy, AS, HTN, and tobacco abuse who presents for consultation in the [facility]. She was admitted in 11/2018 with AF with RVR. 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 Unfortunately, the TEE continued to show residual clot in the LA. Therefore, DCCV was not performed. She was seen infollow upon 02/2019 and her HR was uncontrolled. Unfortunately, repeat DCCV was not arranged due to COVID-19 restrictions (elective procedures on hold). She was placed on Digoxin for rate control. She was admitted 07/15/19-07/18/19 for dofetilide loading. She is on Eliquis for a CHADS2VASC score of 4.   On follow up today, patient reports doing very well since her last visit. She denies any heart racing or palpitations. She is tolerating the medication without difficulty. She denies any bleeding issues on anticoagulation.   Today, she denies symptoms of palpitations, chest pain, shortness of breath, orthopnea, PND, lower extremity edema, dizziness, presyncope, syncope, snoring, daytime somnolence, bleeding, or neurologic sequela. The patient is tolerating medications without difficulties and is otherwise without complaint today.    Atrial Fibrillation Risk Factors:  she does have symptoms of sleep apnea. she does not have a history of rheumatic fever. she does not have a history of alcohol use. The patient does not have a history of early familial atrial fibrillation or other arrhythmias.  she has a BMI of Body mass index is 30.83 kg/m.Marland Kitchen Filed Weights   12/02/19  1435  Weight: 86.6 kg    Family History  Problem Relation Age of Onset  . Arthritis Mother   . Cancer Mother        colon cancer  . Diabetes Mother   . Hypertension Mother   . Heart murmur Mother   . Cancer Brother        LUNG- smoking, [place]  . Early death Brother   . Heart murmur Brother   . Arthritis Maternal Grandmother   . Heart disease Maternal Grandmother        massive heart attack  . Heart disease Paternal Grandmother   . Asthma Brother   . Diabetes Brother   . Hypertension Brother   . Heart murmur Brother   . Heart disease Father        [email or web address]     Atrial Fibrillation Management history:  Previous antiarrhythmic drugs: dofetilide Previous cardioversions: none Previous ablations: none CHADS2VASC score: 4 Anticoagulation history: Eliquis   Past Medical History:  Diagnosis Date  . Allergy    SEASONAL  . Atrial fibrillation ([place])   . Heart murmur   . Hypertension    Past Surgical History:  Procedure Laterality Date  . TEE WITHOUT CARDIOVERSION N/A 12/06/2018   Procedure: TRANSESOPHAGEAL ECHOCARDIOGRAM (TEE);  Surgeon: Lelon Perla, MD;  Location: [facility] ENDOSCOPY;  Service: Cardiovascular;  Laterality: N/A;  . TEE WITHOUT CARDIOVERSION N/A 01/20/2019   Procedure: TRANSESOPHAGEAL ECHOCARDIOGRAM (TEE);  Surgeon: [name], MD;  Location[facility] ENDOSCOPY;  Service: Cardiovascular;  Laterality: N/A;  . TONSILLECTOMY      Current Outpatient Medications  Medication Sig Dispense Refill  . acetaminophen (TYLENOL) 500 MG tablet Take 500 mg by mouth every 6 (six) hours as needed (  for pain.).    Marland Kitchen apixaban (ELIQUIS) 5 MG TABS tablet Take 1 tablet (5 mg total) by mouth every 12 (twelve) hours. 60 tablet 5  . Calcium 600-200 MG-UNIT tablet Take 1 tablet by mouth daily.    Marland Kitchen diltiazem (CARDIZEM CD) 300 MG 24 hr capsule Take 1 capsule (300 mg total) by mouth daily. 90 capsule 3  . dofetilide (TIKOSYN) 250 MCG capsule Take 1 capsule (250 mcg total)  by mouth 2 (two) times daily. 60 capsule 6  . furosemide (LASIX) 40 MG tablet Take 1 tablet by mouth once daily 90 tablet 2  . hydrALAZINE (APRESOLINE) 25 MG tablet Take 1 tablet (25 mg total) by mouth 3 (three) times daily. 90 tablet 11  . loratadine (CLARITIN) 10 MG tablet Take 10 mg by mouth daily as needed for allergies.    . metoprolol tartrate (LOPRESSOR) 50 MG tablet Take 1 tablet in the am and 1 tablet in the pm 360 tablet 3  . nitroGLYCERIN (NITROSTAT) 0.4 MG SL tablet Place 1 tablet (0.4 mg total) under the tongue every 5 (five) minutes as needed for chest pain. 20 tablet 0  . Omega-3 1000 MG CAPS Take 1,000 mg by mouth daily.     Marland Kitchen VITAMIN D PO Take 1 tablet by mouth daily.     No current facility-administered medications for this encounter.    No Known Allergies  Social History   Socioeconomic History  . Marital status: Divorced    Spouse name: Not on file  . Number of children: Not on file  . Years of education: Not on file  . Highest education level: Not on file  Occupational History  . Not on file  Tobacco Use  . Smoking status: Former Smoker    Quit date: 12/05/2011    Years since quitting: 7.9  . Smokeless tobacco: Never Used  Substance and Sexual Activity  . Alcohol use: Yes    Comment: occasionally  . Drug use: No  . Sexual activity: Not Currently    Birth control/protection: None  Other Topics Concern  . Not on file  Social History Narrative  . Not on file   Social Determinants of Health   Financial Resource Strain:   . Difficulty of Paying Living Expenses: Not on file  Food Insecurity:   . Worried About Charity fundraiser in the Last Year: Not on file  . Ran Out of Food in the Last Year: Not on file  Transportation Needs:   . Lack of Transportation (Medical): Not on file  . Lack of Transportation (Non-Medical): Not on file  Physical Activity:   . Days of Exercise per Week: Not on file  . Minutes of Exercise per Session: Not on file  Stress:     . Feeling of Stress : Not on file  Social Connections:   . Frequency of Communication with Friends and Family: Not on file  . Frequency of Social Gatherings with Friends and Family: Not on file  . Attends Religious Services: Not on file  . Active Member of Clubs or Organizations: Not on file  . Attends Archivist Meetings: Not on file  . Marital Status: Not on file  Intimate Partner Violence:   . Fear of Current or Ex-Partner: Not on file  . Emotionally Abused: Not on file  . Physically Abused: Not on file  . Sexually Abused: Not on file     ROS- All systems are reviewed and negative except as per the HPI  above.  Physical Exam: Vitals:   12/02/19 1435  BP: (!) 132/58  Pulse: (!) 51  Weight: 86.6 kg  Height: 5\' 6"  (1.676 m)    GEN- The patient is well appearing obese female, alert and oriented x 3 today.   HEENT-head normocephalic, atraumatic, sclera clear, conjunctiva pink, hearing intact, trachea midline. Lungs- Clear to ausculation bilaterally, normal work of breathing Heart- Regular rhythm, bradycardia no murmurs, rubs or gallops  GI- soft, NT, ND, + BS Extremities- no clubbing, cyanosis, or edema MS- no significant deformity or atrophy Skin- no rash or lesion Psych- euthymic mood, full affect Neuro- strength and sensation are intact   Wt Readings from Last 3 Encounters:  12/02/19 86.6 kg  11/07/19 86.2 kg  08/22/19 82.6 kg    EKG today demonstrates SB HR 51, PR 174, QRS 76, QTc 468  Echo 06/18/19 demonstrated  1. Moderate hypokinesis of the left ventricular, mid-apical inferior wall.  2. The left ventricle has normal systolic function, with an ejection fraction of 55-60%. The cavity size was normal. There is moderately increased left ventricular wall thickness. Left ventricular diastolic Doppler parameters are indeterminate.  3. The right ventricle has normal systolic function. The cavity was normal. There is no increase in right ventricular wall  thickness.  4. Left atrial size was mildly dilated.  5. The aortic valve is tricuspid. Mild thickening of the aortic valve. Mild calcification of the aortic valve. Aortic valve regurgitation is trivial by color flow Doppler. Moderate stenosis of the aortic valve.  6. Peak velocity 3.19m/s, mean gradient 27mmHg.  Epic records are reviewed at length today  Assessment and Plan:  1. Persistent atrial fibrillation Patient s/p dofetilide loading. Patient appears to be maintaining SR. Continue dofetilide 250 mcg BID. QT stable. Will decrease metoprolol to 50 mg BID given bradycardia.  Continue Eliquis 5 mg BID Check mag today. Recent Cmet reviewed.   This patients CHA2DS2-VASc Score and unadjusted Ischemic Stroke Rate (% per year) is equal to 4.8 % stroke rate/year from a score of 4  Above score calculated as 1 point each if present [CHF, HTN, DM, Vascular=MI/PAD/Aortic Plaque, Age if 65-74, or Female] Above score calculated as 2 points each if present [Age > 75, or Stroke/TIA/TE]   2. Snoring The importance of adequate treatment of sleep apnea was discussed today in order to improve our ability to maintain sinus rhythm long term. Patient has deferred sleep study.  4. Cardiomyopathy  EF recovered, 55-60% on recent echo. No signs or symptoms of fluid overload.  5. HTN Stable, med changes as above.    Follow up in the AF clinic in 3 months.   [facility] [address] [place], [place] [id number] [phone] 12/02/2019 3:08 PM

## 2019-12-03 ENCOUNTER — Other Ambulatory Visit (HOSPITAL_COMMUNITY): Payer: Self-pay

## 2019-12-03 MED ORDER — APIXABAN 5 MG PO TABS: 5.0000 mg | ORAL_TABLET | Freq: Two times a day (BID) | ORAL | 6 refills | Status: DC

## 2020-01-02 ENCOUNTER — Other Ambulatory Visit: Payer: Self-pay

## 2020-01-02 ENCOUNTER — Ambulatory Visit
Admission: RE | Admit: 2020-01-02 | Discharge: 2020-01-02 | Disposition: A | Discharge status: Home / Self Care | Payer: Medicare PPO | Source: Ambulatory Visit | Attending: Family Medicine | Admitting: Family Medicine

## 2020-01-02 DIAGNOSIS — Z0000 Encounter for general adult medical examination without abnormal findings: Secondary | ICD-10-CM

## 2020-01-02 DIAGNOSIS — Z1231 Encounter for screening mammogram for malignant neoplasm of breast: Secondary | ICD-10-CM

## 2020-01-02 IMAGING — MG DIGITAL SCREENING BILAT W/ TOMO W/ CAD
8 series · 8 of 24 positions shown · non-contrast
Comparison: Previous exam(s).

CLINICAL DATA: Screening.

EXAM:
DIGITAL SCREENING BILATERAL MAMMOGRAM WITH TOMO AND CAD

[R MLO synth-2D]
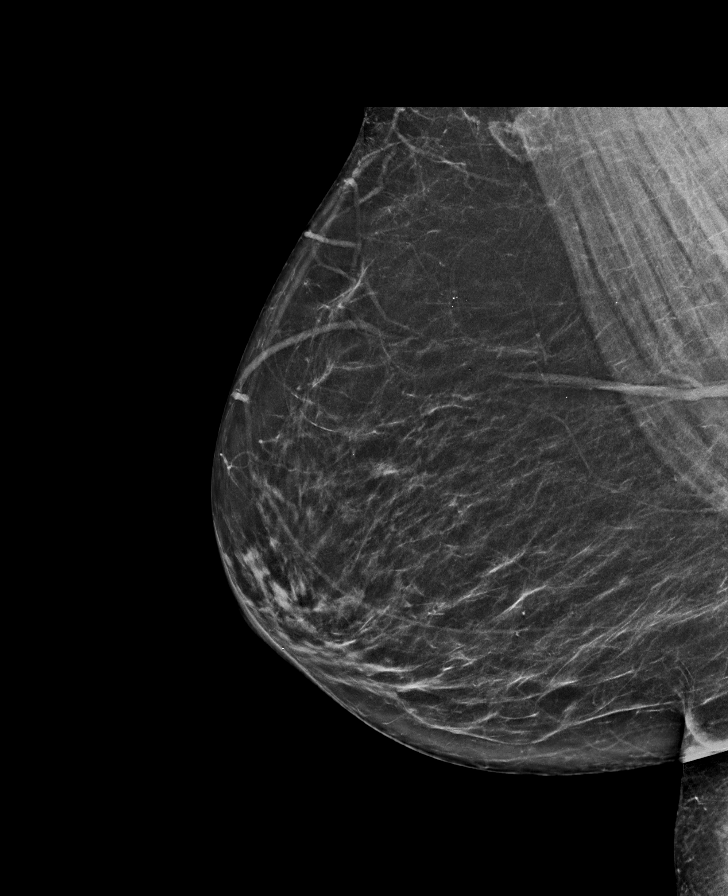

[L MLO synth-2D]
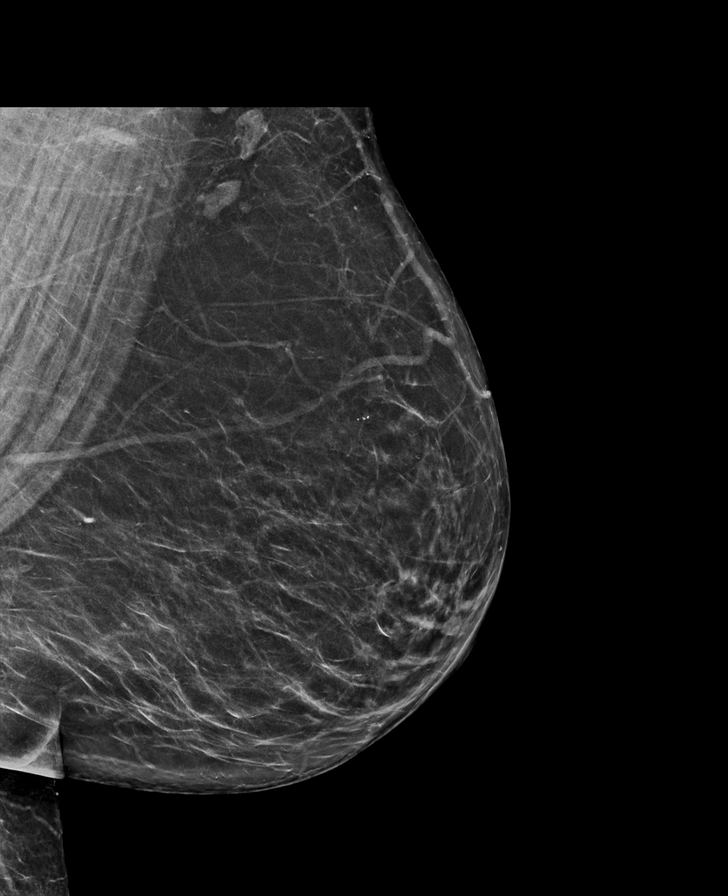

[L CC synth-2D]
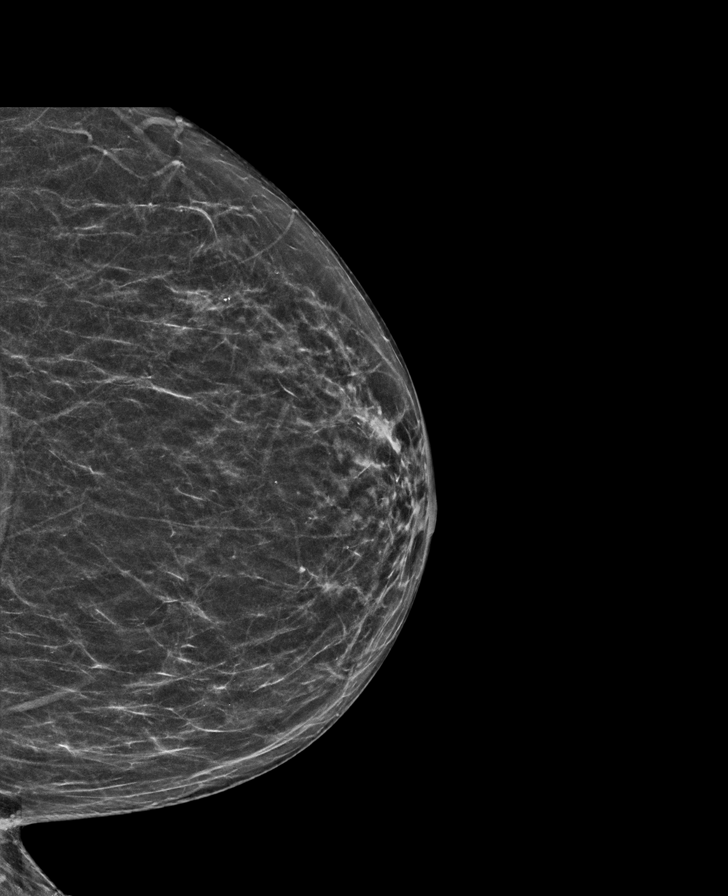

[R CC synth-2D]
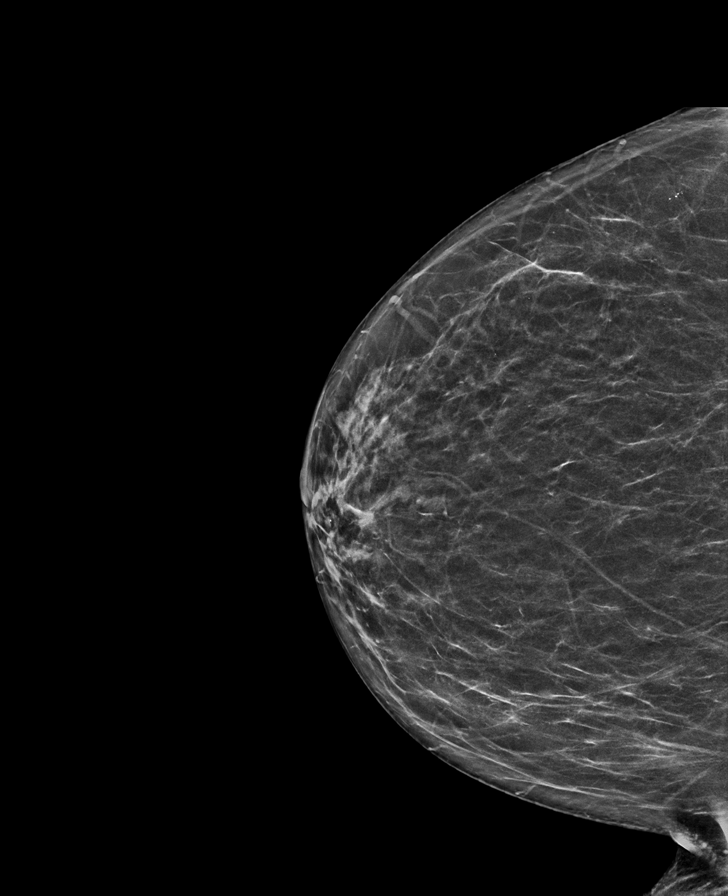

[R CC tomo · tomo slice 33/64.0]
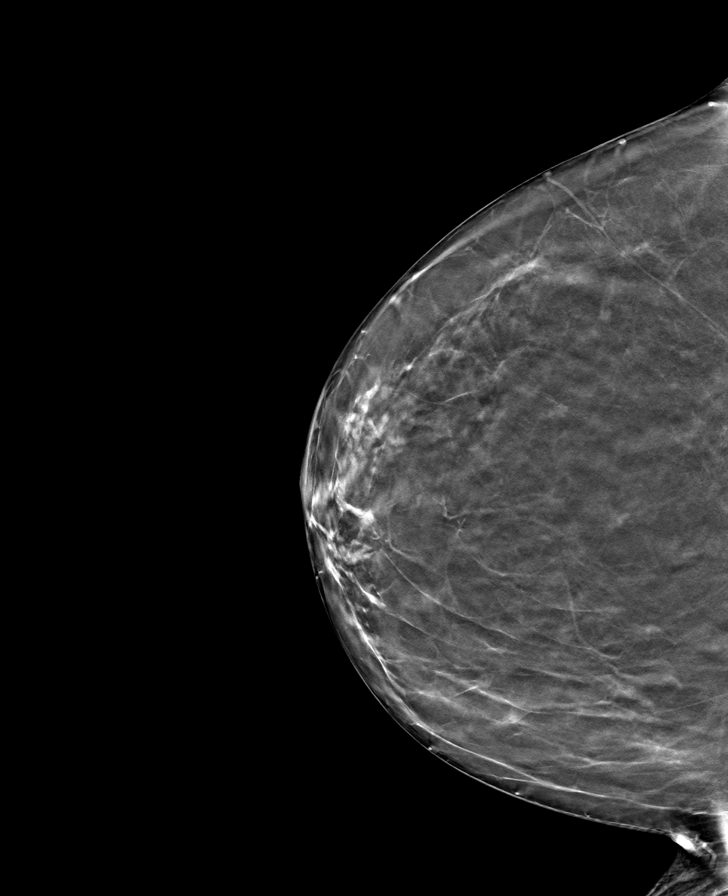

[L CC tomo · tomo slice 30/59.0]
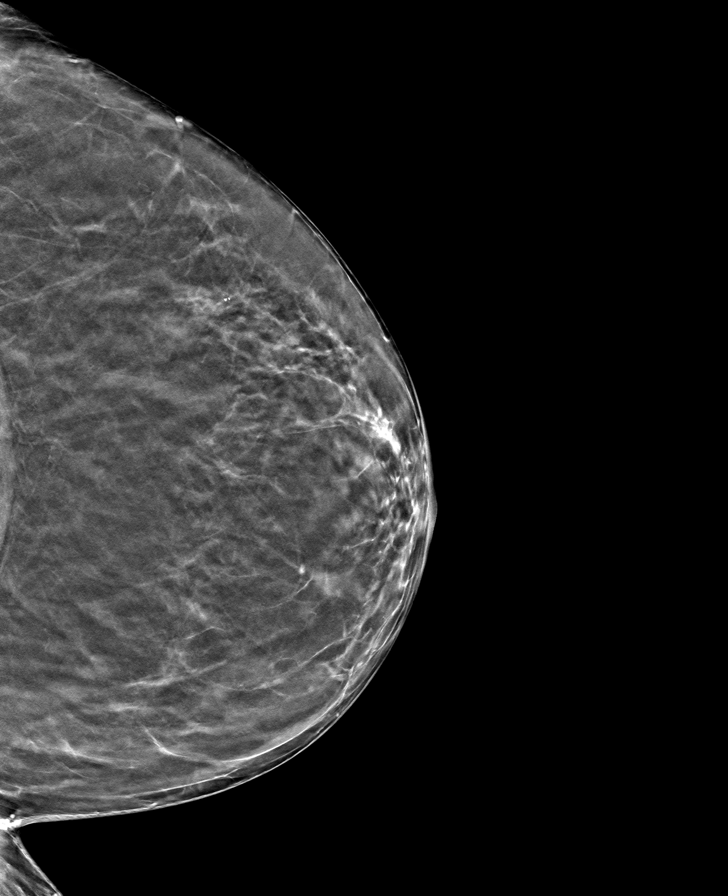

[L MLO tomo · tomo slice 36/71.0]
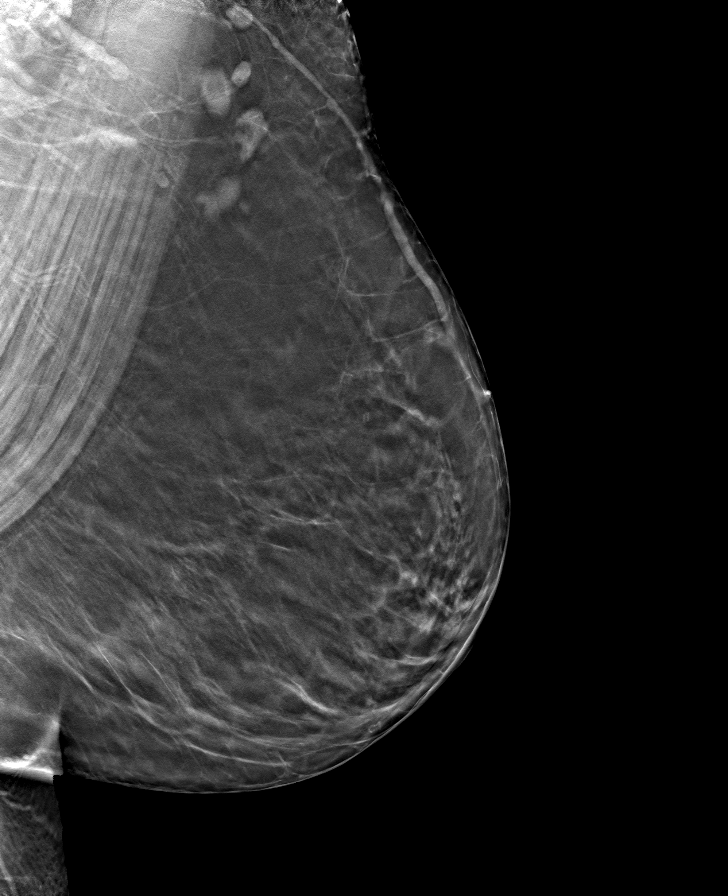

[R MLO tomo · tomo slice 35/70.0]
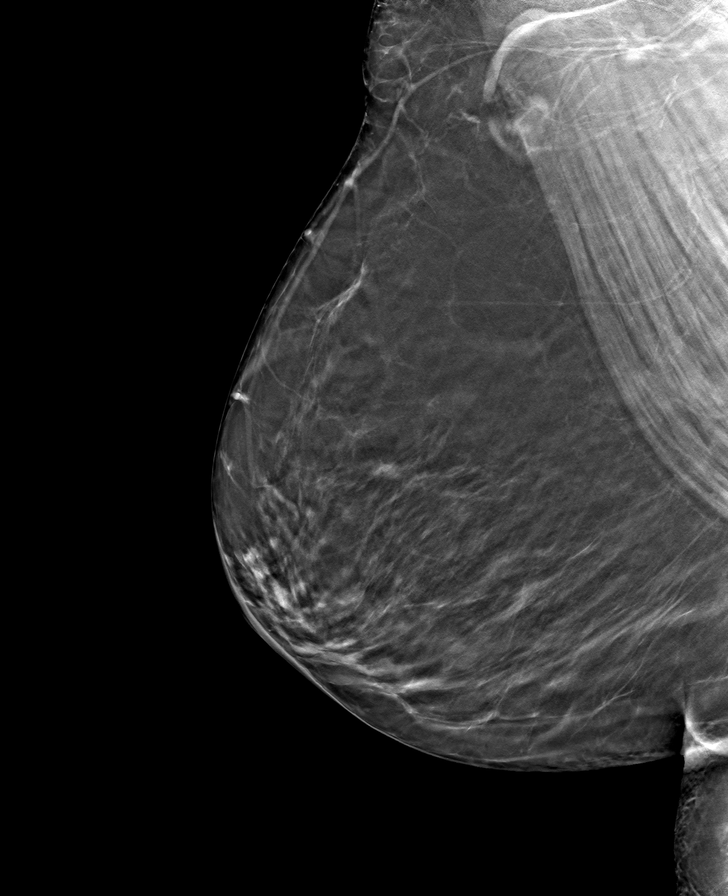

[8 of 24 positions shown; findings below may reference images not displayed]

ACR Breast Density Category b: There are scattered areas of
fibroglandular density.
FINDINGS: There are no findings suspicious for malignancy. Images were
processed with CAD.
IMPRESSION: No mammographic evidence of malignancy. A result letter of this
screening mammogram will be mailed directly to the patient.

RECOMMENDATION:
Screening mammogram in one year. (Code:[TQ])

BI-RADS CATEGORY  1: Negative.

## 2020-02-15 ENCOUNTER — Other Ambulatory Visit: Payer: Self-pay | Admitting: Physician Assistant

## 2020-03-01 ENCOUNTER — Ambulatory Visit (HOSPITAL_COMMUNITY): Payer: Medicare Other | Admitting: Physician Assistant

## 2020-03-05 ENCOUNTER — Ambulatory Visit (HOSPITAL_COMMUNITY): Payer: Medicare PPO | Admitting: Physician Assistant

## 2020-03-12 ENCOUNTER — Encounter (HOSPITAL_COMMUNITY): Payer: Self-pay | Admitting: Physician Assistant

## 2020-03-12 ENCOUNTER — Ambulatory Visit (HOSPITAL_COMMUNITY)
Admission: RE | Admit: 2020-03-12 | Discharge: 2020-03-12 | Disposition: A | Discharge status: Home / Self Care | Payer: Medicare PPO | Source: Ambulatory Visit | Attending: Physician Assistant | Admitting: Physician Assistant

## 2020-03-12 ENCOUNTER — Other Ambulatory Visit: Payer: Self-pay

## 2020-03-12 VITALS — BP 132/80 | HR 64 | Ht 66.0 in | Wt 193.0 lb

## 2020-03-12 DIAGNOSIS — I429 Cardiomyopathy, unspecified: Secondary | ICD-10-CM | POA: Insufficient documentation

## 2020-03-12 DIAGNOSIS — Z79899 Other long term (current) drug therapy: Secondary | ICD-10-CM | POA: Insufficient documentation

## 2020-03-12 DIAGNOSIS — I10 Essential (primary) hypertension: Secondary | ICD-10-CM | POA: Diagnosis not present

## 2020-03-12 DIAGNOSIS — I4819 Other persistent atrial fibrillation: Secondary | ICD-10-CM | POA: Insufficient documentation

## 2020-03-12 DIAGNOSIS — Z87891 Personal history of nicotine dependence: Secondary | ICD-10-CM | POA: Insufficient documentation

## 2020-03-12 DIAGNOSIS — Z8261 Family history of arthritis: Secondary | ICD-10-CM | POA: Diagnosis not present

## 2020-03-12 DIAGNOSIS — Z833 Family history of diabetes mellitus: Secondary | ICD-10-CM | POA: Diagnosis not present

## 2020-03-12 DIAGNOSIS — Z8249 Family history of ischemic heart disease and other diseases of the circulatory system: Secondary | ICD-10-CM | POA: Insufficient documentation

## 2020-03-12 DIAGNOSIS — Z800 Family history of malignant neoplasm of digestive organs: Secondary | ICD-10-CM | POA: Insufficient documentation

## 2020-03-12 DIAGNOSIS — D6869 Other thrombophilia: Secondary | ICD-10-CM

## 2020-03-12 DIAGNOSIS — Z825 Family history of asthma and other chronic lower respiratory diseases: Secondary | ICD-10-CM | POA: Diagnosis not present

## 2020-03-12 DIAGNOSIS — Z801 Family history of malignant neoplasm of trachea, bronchus and lung: Secondary | ICD-10-CM | POA: Diagnosis not present

## 2020-03-12 DIAGNOSIS — Z7901 Long term (current) use of anticoagulants: Secondary | ICD-10-CM | POA: Insufficient documentation

## 2020-03-12 LAB — BASIC METABOLIC PANEL
Anion gap: 11 (ref 5–15)
BUN: 18 mg/dL (ref 8–23)
CO2: 26 mmol/L (ref 22–32)
Calcium: 9.4 mg/dL (ref 8.9–10.3)
Chloride: 103 mmol/L (ref 98–111)
Creatinine, Ser: 1.46 mg/dL — ABNORMAL HIGH (ref 0.44–1.00)
GFR calc Af Amer: 43 mL/min — ABNORMAL LOW (ref >60)
GFR calc non Af Amer: 37 mL/min — ABNORMAL LOW (ref >60)
Glucose, Bld: 104 mg/dL — ABNORMAL HIGH (ref 70–99)
Potassium: 4.2 mmol/L (ref 3.5–5.1)
Sodium: 140 mmol/L (ref 135–145)

## 2020-03-12 LAB — MAGNESIUM: Magnesium: 1.9 mg/dL (ref 1.7–2.4)

## 2020-03-12 NOTE — Progress Notes (Signed)
Primary Care Physician: Antony Blackbird, MD Primary Cardiologist: Dr Burt Knack Primary Electrophysiologist: Dr Rayann Heman Referring Physician: Richardson Dopp PA-C   Tracy James is a 69 y.o. female with a history of persistent atrial fibrillation, tachycardia induced cardiomyopathy, AS, HTN, and tobacco abuse who presents for follow up in the [facility]. She was admitted in 11/2018 with AF with RVR. 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 Unfortunately, the TEE continued to show residual clot in the LA. Therefore, DCCV was not performed. She was seen infollow upon 02/2019 and her HR was uncontrolled. Unfortunately, repeat DCCV was not arranged due to COVID-19 restrictions (elective procedures on hold). She was placed on Digoxin for rate control. She was admitted 07/15/19-07/18/19 for dofetilide loading. She is on Eliquis for a CHADS2VASC score of 4.   On follow up today, patient reports that she has done very well since her last visit. He has not had any heart racing or palpitations. She is tolerating the medication without difficulty. Denies bleeding issues on anticoagulation.   Today, she denies symptoms of palpitations, chest pain, shortness of breath, orthopnea, PND, lower extremity edema, dizziness, presyncope, syncope, snoring, daytime somnolence, bleeding, or neurologic sequela. The patient is tolerating medications without difficulties and is otherwise without complaint today.    Atrial Fibrillation Risk Factors:  she does have symptoms of sleep apnea. she does not have a history of rheumatic fever. she does not have a history of alcohol use. The patient does not have a history of early familial atrial fibrillation or other arrhythmias.  she has a BMI of Body mass index is 31.15 kg/m.Marland Kitchen Filed Weights    03/12/20 0850  Weight: 87.5 kg    Family History  Problem Relation Age of Onset  . Arthritis Mother   . Cancer Mother        colon cancer  . Diabetes Mother   . Hypertension Mother   . Heart murmur Mother   . Cancer Brother        LUNG- smoking, [place]  . Early death Brother   . Heart murmur Brother   . Arthritis Maternal Grandmother   . Heart disease Maternal Grandmother        massive heart attack  . Heart disease Paternal Grandmother   . Asthma Brother   . Diabetes Brother   . Hypertension Brother   . Heart murmur Brother   . Heart disease Father        anuersym- ruptered     Atrial Fibrillation Management history:  Previous antiarrhythmic drugs: dofetilide Previous cardioversions: none Previous ablations: none CHADS2VASC score: 4 Anticoagulation history: Eliquis   Past Medical History:  Diagnosis Date  . Allergy    SEASONAL  . Atrial fibrillation ([place])   . Heart murmur   . Hypertension    Past Surgical History:  Procedure Laterality Date  . TEE WITHOUT CARDIOVERSION N/A 12/06/2018   Procedure: TRANSESOPHAGEAL ECHOCARDIOGRAM (TEE);  Surgeon: Lelon Perla, MD;  Location: [facility] ENDOSCOPY;  Service: Cardiovascular;  Laterality: N/A;  . TEE WITHOUT CARDIOVERSION N/A 01/20/2019   Procedure: TRANSESOPHAGEAL ECHOCARDIOGRAM (TEE);  Surgeon: [name], MD;  Location: [facility] ENDOSCOPY;  Service: Cardiovascular;  Laterality: N/A;  . TONSILLECTOMY      Current Outpatient Medications  Medication Sig Dispense Refill  . acetaminophen (TYLENOL) 500 MG tablet Take 500 mg by mouth every 6 (  six) hours as needed (for pain.).    Marland Kitchen apixaban (ELIQUIS) 5 MG TABS tablet Take 1 tablet (5 mg total) by mouth every 12 (twelve) hours. 60 tablet 6  . Calcium 600-200 MG-UNIT tablet Take 1 tablet by mouth daily.    Marland Kitchen diltiazem (CARDIZEM CD) 300 MG 24 hr capsule Take 1 capsule (300 mg total) by mouth daily. 90 capsule 3  . dofetilide (TIKOSYN) 250 MCG capsule Take 1 capsule by  mouth twice daily 60 capsule 6  . furosemide (LASIX) 40 MG tablet Take 1 tablet by mouth once daily 90 tablet 2  . hydrALAZINE (APRESOLINE) 25 MG tablet Take 1 tablet (25 mg total) by mouth 3 (three) times daily. 90 tablet 11  . loratadine (CLARITIN) 10 MG tablet Take 10 mg by mouth daily as needed for allergies.    . metoprolol tartrate (LOPRESSOR) 50 MG tablet Take 1 tablet in the am and 1 tablet in the pm 360 tablet 3  . nitroGLYCERIN (NITROSTAT) 0.4 MG SL tablet Place 1 tablet (0.4 mg total) under the tongue every 5 (five) minutes as needed for chest pain. 20 tablet 0  . Omega-3 1000 MG CAPS Take 1,000 mg by mouth daily.     Marland Kitchen VITAMIN D PO Take 1 tablet by mouth daily.     No current facility-administered medications for this encounter.    No Known Allergies  Social History   Socioeconomic History  . Marital status: Divorced    Spouse name: Not on file  . Number of children: Not on file  . Years of education: Not on file  . Highest education level: Not on file  Occupational History  . Not on file  Tobacco Use  . Smoking status: Former Smoker    Quit date: 12/05/2011    Years since quitting: 8.2  . Smokeless tobacco: Never Used  Substance and Sexual Activity  . Alcohol use: Yes    Alcohol/week: 1.0 standard drinks    Types: 1 Glasses of wine per week    Comment: occasionally  . Drug use: No  . Sexual activity: Not Currently    Birth control/protection: None  Other Topics Concern  . Not on file  Social History Narrative  . Not on file   Social Determinants of Health   Financial Resource Strain:   . Difficulty of Paying Living Expenses:   Food Insecurity:   . Worried About Charity fundraiser in the Last Year:   . Arboriculturist in the Last Year:   Transportation Needs:   . Film/video editor (Medical):   Marland Kitchen Lack of Transportation (Non-Medical):   Physical Activity:   . Days of Exercise per Week:   . Minutes of Exercise per Session:   Stress:   . Feeling of  Stress :   Social Connections:   . Frequency of Communication with Friends and Family:   . Frequency of Social Gatherings with Friends and Family:   . Attends Religious Services:   . Active Member of Clubs or Organizations:   . Attends Archivist Meetings:   Marland Kitchen Marital Status:   Intimate Partner Violence:   . Fear of Current or Ex-Partner:   . Emotionally Abused:   Marland Kitchen Physically Abused:   . Sexually Abused:      ROS- All systems are reviewed and negative except as per the HPI above.  Physical Exam: Vitals:   03/12/20 0850  BP: 132/80  Pulse: 64  Weight: 87.5 kg  Height:  5\' 6"  (1.676 m)    GEN- The patient is well appearing obese female, alert and oriented x 3 today.   HEENT-head normocephalic, atraumatic, sclera clear, conjunctiva pink, hearing intact, trachea midline. Lungs- Clear to ausculation bilaterally, normal work of breathing Heart- Regular rate and rhythm, no murmurs, rubs or gallops  GI- soft, NT, ND, + BS Extremities- no clubbing, cyanosis, or edema MS- no significant deformity or atrophy Skin- no rash or lesion Psych- euthymic mood, full affect Neuro- strength and sensation are intact   Wt Readings from Last 3 Encounters:  03/12/20 87.5 kg  12/02/19 86.6 kg  11/07/19 86.2 kg    EKG today demonstrates SR HR 64, PAC, PR 176, QRS 76, QTc 468  Echo 06/18/19 demonstrated  1. Moderate hypokinesis of the left ventricular, mid-apical inferior wall.  2. The left ventricle has normal systolic function, with an ejection fraction of 55-60%. The cavity size was normal. There is moderately increased left ventricular wall thickness. Left ventricular diastolic Doppler parameters are indeterminate.  3. The right ventricle has normal systolic function. The cavity was normal. There is no increase in right ventricular wall thickness.  4. Left atrial size was mildly dilated.  5. The aortic valve is tricuspid. Mild thickening of the aortic valve. Mild calcification of  the aortic valve. Aortic valve regurgitation is trivial by color flow Doppler. Moderate stenosis of the aortic valve.  6. Peak velocity 3.35m/s, mean gradient 52mmHg.  Epic records are reviewed at length today  Assessment and Plan:  1. Persistent atrial fibrillation Patient appears to be maintaining SR. Continue dofetilide 250 mcg BID. QT stable. Continue metoprolol 50 mg BID Continue Eliquis 5 mg BID Check bmet/mag today.  This patients CHA2DS2-VASc Score and unadjusted Ischemic Stroke Rate (% per year) is equal to 4.8 % stroke rate/year from a score of 4  Above score calculated as 1 point each if present [CHF, HTN, DM, Vascular=MI/PAD/Aortic Plaque, Age if 65-74, or Female] Above score calculated as 2 points each if present [Age > 75, or Stroke/TIA/TE]  2. Cardiomyopathy  EF recovered, 55-60% on recent echo. No signs or symptoms of fluid overload.  3. HTN Stable, no changes today.   Follow up in the AF clinic in 4 months.    [facility] [address] [place], [place] [address] [phone] 03/12/2020 9:10 AM

## 2020-03-23 ENCOUNTER — Other Ambulatory Visit: Payer: Self-pay | Admitting: Physician Assistant

## 2020-05-13 ENCOUNTER — Other Ambulatory Visit: Payer: Self-pay | Admitting: Physician Assistant

## 2020-05-14 MED ORDER — DILTIAZEM HCL ER COATED BEADS 300 MG PO CP24: 300.0000 mg | ORAL_CAPSULE | Freq: Every day | ORAL | 0 refills | Status: DC

## 2020-06-04 ENCOUNTER — Other Ambulatory Visit: Payer: Self-pay | Admitting: Physician Assistant

## 2020-06-11 ENCOUNTER — Ambulatory Visit (HOSPITAL_COMMUNITY): Payer: Medicare PPO | Admitting: Physician Assistant

## 2020-06-17 ENCOUNTER — Other Ambulatory Visit: Payer: Self-pay | Admitting: Physician Assistant

## 2020-06-18 MED ORDER — DILTIAZEM HCL ER COATED BEADS 300 MG PO CP24: 300.0000 mg | ORAL_CAPSULE | Freq: Every day | ORAL | 0 refills | Status: DC

## 2020-07-01 ENCOUNTER — Other Ambulatory Visit (HOSPITAL_COMMUNITY): Payer: Self-pay | Admitting: Physician Assistant

## 2020-07-01 ENCOUNTER — Other Ambulatory Visit: Payer: Self-pay | Admitting: Cardiovascular Disease

## 2020-07-08 NOTE — Progress Notes (Signed)
Primary Care Physician: Antony Blackbird, MD Primary Cardiologist: Dr Burt Knack Primary Electrophysiologist: Dr Rayann Heman Referring Physician: Richardson Dopp PA-C   Tracy James is a 69 y.o. female with a history of persistent atrial fibrillation, tachycardia induced cardiomyopathy, AS, HTN, and tobacco abuse who presents for follow up in the [facility]. She was admitted in 11/2018 with AF with RVR. 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 Unfortunately, the TEE continued to show residual clot in the LA. Therefore, DCCV was not performed. She was seen infollow upon 02/2019 and her HR was uncontrolled. Unfortunately, repeat DCCV was not arranged due to COVID-19 restrictions (elective procedures on hold). She was placed on Digoxin for rate control. She was admitted 07/15/19-07/18/19 for dofetilide loading. She is on Eliquis for a CHADS2VASC score of 4.   On follow up today, patient reports she has done very well since her last visit. She denies any heart racing or palpitations. She is tolerating dofetilide without issue. She denies bleeding issues on anticoagulation.   Today, she denies symptoms of palpitations, chest pain, shortness of breath, orthopnea, PND, lower extremity edema, dizziness, presyncope, syncope, snoring, daytime somnolence, bleeding, or neurologic sequela. The patient is tolerating medications without difficulties and is otherwise without complaint today.    Atrial Fibrillation Risk Factors:  she does have symptoms of sleep apnea. she does not have a history of rheumatic fever. she does not have a history of alcohol use. The patient does not have a history of early familial atrial fibrillation or other arrhythmias.  she has a BMI of Body mass index is 30.6 kg/m.Marland Kitchen Filed Weights   07/09/20 0856   Weight: 86 kg    Family History  Problem Relation Age of Onset  . Arthritis Mother   . Cancer Mother        colon cancer  . Diabetes Mother   . Hypertension Mother   . Heart murmur Mother   . Cancer Brother        LUNG- smoking, [place]est[place]  . Early death Brother   . Heart murmur Brother   . Arthritis Maternal Grandmother   . Heart disease Maternal Grandmother        massive heart attack  . Heart disease Paternal Grandmother   . Asthma Brother   . Diabetes Brother   . Hypertension Brother   . Heart murmur Brother   . Heart disease Father        anuersym- ruptered     Atrial Fibrillation Management history:  Previous antiarrhythmic drugs: dofetilide Previous cardioversions: none Previous ablations: none CHADS2VASC score: 4 Anticoagulation history: Eliquis   Past Medical History:  Diagnosis Date  . Allergy    SEASONAL  . Atrial fibrillation ([place])   . Heart murmur   . Hypertension    Past Surgical History:  Procedure Laterality Date  . TEE WITHOUT CARDIOVERSION N/A 12/06/2018   Procedure: TRANSESOPHAGEAL ECHOCARDIOGRAM (TEE);  Surgeon: Lelon Perla, MD;  Location: [facility] ENDOSCOPY;  Service: Cardiovascular;  Laterality: N/A;  . TEE WITHOUT CARDIOVERSION N/A 01/20/2019   Procedure: TRANSESOPHAGEAL ECHOCARDIOGRAM (TEE);  Surgeon: [name], MD;  Location: [facility] ENDOSCOPY;  Service: Cardiovascular;  Laterality: N/A;  . TONSILLECTOMY      Current Outpatient Medications  Medication Sig Dispense Refill  . acetaminophen (TYLENOL) 500 MG tablet Take 500 mg by mouth every 6 (six) hours as  needed (for pain.).    Marland Kitchen Calcium 600-200 MG-UNIT tablet Take 1 tablet by mouth daily.    . calcium-vitamin D (OSCAL WITH D) 500-200 MG-UNIT TABS tablet Take by mouth.    . diltiazem (CARDIZEM CD) 300 MG 24 hr capsule Take 1 capsule (300 mg total) by mouth daily. Please make overdue appt with Dr. Burt Knack before anymore refills. 2nd attempt 15 capsule 0  . dofetilide (TIKOSYN) 250 MCG  capsule Take 1 capsule by mouth twice daily 60 capsule 6  . ELIQUIS 5 MG TABS tablet TAKE 1 TABLET BY MOUTH EVERY 12 HOURS 60 tablet 0  . furosemide (LASIX) 40 MG tablet Take 1 tablet (40 mg total) by mouth daily. Please make overdue appt with Dr. Burt Knack before anymore refills. 1st attempt 30 tablet 0  . hydrALAZINE (APRESOLINE) 25 MG tablet Take 1 tablet (25 mg total) by mouth 3 (three) times daily. Please make yearly appt with Dr. Burt Knack for June for future refills. 1st attempt 270 tablet 0  . loratadine (CLARITIN) 10 MG tablet Take 10 mg by mouth daily as needed for allergies.    . metoprolol tartrate (LOPRESSOR) 50 MG tablet Take 1 tablet by mouth twice daily    . mometasone (ELOCON) 0.1 % ointment Apply to areas of rash twice daily until smooth then stop. Restart as needed    . nitroGLYCERIN (NITROSTAT) 0.4 MG SL tablet Place 1 tablet (0.4 mg total) under the tongue every 5 (five) minutes as needed for chest pain. 20 tablet 0  . Omega-3 1000 MG CAPS Take 1,000 mg by mouth daily.     Marland Kitchen VITAMIN D PO Take 1 tablet by mouth daily.     No current facility-administered medications for this encounter.    No Known Allergies  Social History   Socioeconomic History  . Marital status: Divorced    Spouse name: Not on file  . Number of children: Not on file  . Years of education: Not on file  . Highest education level: Not on file  Occupational History  . Not on file  Tobacco Use  . Smoking status: Former Smoker    Quit date: 12/05/2011    Years since quitting: 8.6  . Smokeless tobacco: Never Used  Vaping Use  . Vaping Use: Never used  Substance and Sexual Activity  . Alcohol use: Yes    Alcohol/week: 1.0 standard drink    Types: 1 Glasses of wine per week    Comment: occasionally  . Drug use: No  . Sexual activity: Not Currently    Birth control/protection: None  Other Topics Concern  . Not on file  Social History Narrative  . Not on file   Social Determinants of Health    Financial Resource Strain:   . Difficulty of Paying Living Expenses:   Food Insecurity:   . Worried About Charity fundraiser in the Last Year:   . Arboriculturist in the Last Year:   Transportation Needs:   . Film/video editor (Medical):   Marland Kitchen Lack of Transportation (Non-Medical):   Physical Activity:   . Days of Exercise per Week:   . Minutes of Exercise per Session:   Stress:   . Feeling of Stress :   Social Connections:   . Frequency of Communication with Friends and Family:   . Frequency of Social Gatherings with Friends and Family:   . Attends Religious Services:   . Active Member of Clubs or Organizations:   . Attends Club  or Organization Meetings:   Marland Kitchen Marital Status:   Intimate Partner Violence:   . Fear of Current or Ex-Partner:   . Emotionally Abused:   Marland Kitchen Physically Abused:   . Sexually Abused:      ROS- All systems are reviewed and negative except as per the HPI above.  Physical Exam: Vitals:   07/09/20 0856  BP: (!) 150/70  Pulse: 60  Weight: 86 kg  Height: 5\' 6"  (1.676 m)    GEN- The patient is well appearing obese female, alert and oriented x 3 today.   HEENT-head normocephalic, atraumatic, sclera clear, conjunctiva pink, hearing intact, trachea midline. Lungs- Clear to ausculation bilaterally, normal work of breathing Heart- Regular rate and rhythm, no murmurs, rubs or gallops  GI- soft, NT, ND, + BS Extremities- no clubbing, cyanosis, or edema MS- no significant deformity or atrophy Skin- no rash or lesion Psych- euthymic mood, full affect Neuro- strength and sensation are intact   Wt Readings from Last 3 Encounters:  07/09/20 86 kg  03/12/20 87.5 kg  12/02/19 86.6 kg    EKG today demonstrates SR HR 60, PR 170, QRS 76, QTc 466  Echo 06/18/19 demonstrated  1. Moderate hypokinesis of the left ventricular, mid-apical inferior wall.  2. The left ventricle has normal systolic function, with an ejection fraction of 55-60%. The cavity size  was normal. There is moderately increased left ventricular wall thickness. Left ventricular diastolic Doppler parameters are indeterminate.  3. The right ventricle has normal systolic function. The cavity was normal. There is no increase in right ventricular wall thickness.  4. Left atrial size was mildly dilated.  5. The aortic valve is tricuspid. Mild thickening of the aortic valve. Mild calcification of the aortic valve. Aortic valve regurgitation is trivial by color flow Doppler. Moderate stenosis of the aortic valve.  6. Peak velocity 3.40m/s, mean gradient 22mmHg.  Epic records are reviewed at length today  Assessment and Plan:  1. Persistent atrial fibrillation Patient appears to be maintaining SR. Continue dofetilide 250 mcg BID. QT stable. Continue metoprolol 50 mg BID Continue Eliquis 5 mg BID Check bmet/mag today.  This patients CHA2DS2-VASc Score and unadjusted Ischemic Stroke Rate (% per year) is equal to 4.8 % stroke rate/year from a score of 4  Above score calculated as 1 point each if present [CHF, HTN, DM, Vascular=MI/PAD/Aortic Plaque, Age if 65-74, or Female] Above score calculated as 2 points each if present [Age > 75, or Stroke/TIA/TE]  2. Cardiomyopathy  EF recovered, 55-60% on recent echo. No signs or symptoms of fluid overload.  3. HTN Stable, no changes today.   Follow up with Dr Antionette Char office in 3 months. AF clinic in 6 months.    [facility] [address] [place], [place] [address] [phone] 07/09/2020 9:36 AM

## 2020-07-09 ENCOUNTER — Ambulatory Visit (HOSPITAL_COMMUNITY)
Admission: RE | Admit: 2020-07-09 | Discharge: 2020-07-09 | Disposition: A | Discharge status: Home / Self Care | Payer: Medicare PPO | Source: Ambulatory Visit | Attending: Physician Assistant | Admitting: Physician Assistant

## 2020-07-09 ENCOUNTER — Other Ambulatory Visit: Payer: Self-pay

## 2020-07-09 ENCOUNTER — Telehealth: Payer: Self-pay | Admitting: Cardiovascular Disease

## 2020-07-09 VITALS — BP 150/70 | HR 60 | Ht 66.0 in | Wt 189.6 lb

## 2020-07-09 DIAGNOSIS — I429 Cardiomyopathy, unspecified: Secondary | ICD-10-CM | POA: Diagnosis not present

## 2020-07-09 DIAGNOSIS — I4819 Other persistent atrial fibrillation: Secondary | ICD-10-CM | POA: Diagnosis present

## 2020-07-09 DIAGNOSIS — Z79899 Other long term (current) drug therapy: Secondary | ICD-10-CM | POA: Diagnosis not present

## 2020-07-09 DIAGNOSIS — Z87891 Personal history of nicotine dependence: Secondary | ICD-10-CM | POA: Diagnosis not present

## 2020-07-09 DIAGNOSIS — I350 Nonrheumatic aortic (valve) stenosis: Secondary | ICD-10-CM | POA: Diagnosis not present

## 2020-07-09 DIAGNOSIS — R9431 Abnormal electrocardiogram [ECG] [EKG]: Secondary | ICD-10-CM | POA: Insufficient documentation

## 2020-07-09 DIAGNOSIS — D6869 Other thrombophilia: Secondary | ICD-10-CM

## 2020-07-09 DIAGNOSIS — I10 Essential (primary) hypertension: Secondary | ICD-10-CM | POA: Diagnosis not present

## 2020-07-09 DIAGNOSIS — Z7901 Long term (current) use of anticoagulants: Secondary | ICD-10-CM | POA: Diagnosis not present

## 2020-07-09 LAB — BASIC METABOLIC PANEL
Anion gap: 8 (ref 5–15)
BUN: 25 mg/dL — ABNORMAL HIGH (ref 8–23)
CO2: 24 mmol/L (ref 22–32)
Calcium: 9.3 mg/dL (ref 8.9–10.3)
Chloride: 106 mmol/L (ref 98–111)
Creatinine, Ser: 1.29 mg/dL — ABNORMAL HIGH (ref 0.44–1.00)
GFR calc Af Amer: 50 mL/min — ABNORMAL LOW (ref >60)
GFR calc non Af Amer: 43 mL/min — ABNORMAL LOW (ref >60)
Glucose, Bld: 90 mg/dL (ref 70–99)
Potassium: 4.7 mmol/L (ref 3.5–5.1)
Sodium: 138 mmol/L (ref 135–145)

## 2020-07-09 LAB — MAGNESIUM: Magnesium: 2 mg/dL (ref 1.7–2.4)

## 2020-07-09 MED ORDER — METOPROLOL TARTRATE 50 MG PO TABS: ORAL_TABLET | ORAL | Status: DC

## 2020-07-09 MED ORDER — DILTIAZEM HCL ER COATED BEADS 300 MG PO CP24: 300.0000 mg | ORAL_CAPSULE | Freq: Every day | ORAL | 6 refills | Status: DC

## 2020-07-09 MED ORDER — HYDRALAZINE HCL 25 MG PO TABS: 25.0000 mg | ORAL_TABLET | Freq: Three times a day (TID) | ORAL | 1 refills | Status: DC

## 2020-07-09 NOTE — Telephone Encounter (Signed)
Pt's medication was sent to pt's pharmacy as requested. Confirmation received.  °

## 2020-07-09 NOTE — Telephone Encounter (Signed)
New Message    *STAT* If patient is at the pharmacy, call can be transferred to refill team.   1. Which medications need to be refilled? (please list name of each medication and dose if known) hydrALAZINE (APRESOLINE) 25 MG tablet   2. Which pharmacy/location (including street and city if local pharmacy) is medication to be sent to? [place], [place]  3. Do they need a 30 day or 90 day supply? [place]

## 2020-07-09 NOTE — Addendum Note (Signed)
Encounter addended by: Enid Derry, CMA on: 07/09/2020 10:06 AM  Actions taken: Order Reconciliation Section accessed, Pharmacy for encounter modified, Order list changed

## 2020-07-19 ENCOUNTER — Other Ambulatory Visit: Payer: Self-pay | Admitting: Cardiovascular Disease

## 2020-08-08 ENCOUNTER — Other Ambulatory Visit (HOSPITAL_COMMUNITY): Payer: Self-pay | Admitting: Physician Assistant

## 2020-08-10 ENCOUNTER — Other Ambulatory Visit (HOSPITAL_COMMUNITY): Payer: Self-pay | Admitting: *Deleted

## 2020-08-11 ENCOUNTER — Encounter: Payer: Self-pay | Admitting: Physician Assistant

## 2020-08-11 ENCOUNTER — Other Ambulatory Visit: Payer: Self-pay

## 2020-08-11 ENCOUNTER — Ambulatory Visit: Payer: Medicare PPO | Admitting: Physician Assistant

## 2020-08-11 VITALS — BP 110/40 | HR 63 | Ht 66.0 in | Wt 185.0 lb

## 2020-08-11 DIAGNOSIS — I350 Nonrheumatic aortic (valve) stenosis: Secondary | ICD-10-CM | POA: Diagnosis not present

## 2020-08-11 DIAGNOSIS — I420 Dilated cardiomyopathy: Secondary | ICD-10-CM | POA: Diagnosis not present

## 2020-08-11 DIAGNOSIS — I4819 Other persistent atrial fibrillation: Secondary | ICD-10-CM

## 2020-08-11 DIAGNOSIS — N1831 Chronic kidney disease, stage 3a: Secondary | ICD-10-CM

## 2020-08-11 DIAGNOSIS — D6869 Other thrombophilia: Secondary | ICD-10-CM | POA: Diagnosis not present

## 2020-08-11 DIAGNOSIS — I10 Essential (primary) hypertension: Secondary | ICD-10-CM

## 2020-08-11 NOTE — Progress Notes (Signed)
Cardiology Office Note:    Date:  08/11/2020   ID:  Tracy James, Tracy James 1950/12/28, MRN [id number]  PCP:  Antony Blackbird, MD  [facility] Cardiologist:  Sherren Mocha, MD  [place] Electrophysiologist:  None   Referring MD: Antony Blackbird, MD   Chief Complaint:  Follow-up (Atrial fibrillation, aortic stenosis)    Patient Profile:    Tracy James is a 69 y.o. female with:   Persistent AFib ? admx 11/2018 with AF with RVR; EF 40-45; TEE w/ + R and L atria clot (?clot adjacent to heart) ? TEE in 01/2019 with continued LA clot ? DCCV delayed due to COVID-19 ? Dofetilide Rx started 07/2019 ? CHA2DS2-VASc=6 (female, HTN, age x 1, CHF, LAA clot) >> Apixaban    HFrEF w/ return of normal LVF ? 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 TEE 12/2018: EF 30-35 ? Echocardiogram 06/2019: EF 55-60  Coronary CTA in August 2018 demonstrated a calcium score of 0 and no evidence of CAD  Aortic stenosis  Mod AS; Echocardiogram 06/2019: mean 28 mmHg   Chronic kidney disease   Hypertension  Former smoker   Aortic atherosclerosis   Prior CV studies: Echocardiogram 06/18/2019 Inf HK, EF 55-60, mod LVH, normal RVSF, mild LAE, trivial AI, mod AS (mean 28 mmHg)  TEE 01/20/2019 EF 30-35, atrial level shunting, small fixed LA thrombus on interior of LAA, no RAA clot  Transesophageal echocardiogram 01/02/2019 EF 30-35, diffuse HK, AV cusp separation severely reduced, mild AI, mild MR, moderate LAE, positive LAA clot, severely reduced RV SF, moderate RAE with positive clot in the atrial cavity, small pericardial effusion, poss thrombus in pericardial space  Echo 12/04/2018 Mild LVH, EF 40-45, diffuse HK, mild to moderate aortic stenosis (mean 19, peak 30), moderate MR, mild LAE, moderately reduced RV SF, PASP 28  Carotid US 10/10/2018 No Bilat ICA stenosis  Echo 08/01/17 Mod LVH, EF 60-65, no RWMA, Gr 2 DD, mod AS (mean 35, peak 60), LVOT/AV velocity ratio 0.21, trivial MR,  mild LAE  Coronary CTA 07/25/17 Ca score 0; no evidence of CAD  Chest CTA 07/19/17 No TAA or dissection; emphysema; aortic atherosclerosis   Carotid US 12/14 There is no evidence of a hemodynamically significant carotid stenosis.  History of Present Illness:    Tracy James was last seen in 05/2019. She was ultimately seen by the atrial fibrillation clinic and underwent dofetilide load in 07/2019. She has maintained sinus rhythm since. She is followed closely in the atrial fibrillation clinic. She returns for further follow-up. She is here alone today. She has not had chest discomfort, shortness of breath, syncope, orthopnea, leg swelling. She has been doing well. She has been staying with her daughter in [place] and taking care of her grandson who was born last year. He just started daycare and she has moved back to [place].      Past Medical History:  Diagnosis Date   Allergy    SEASONAL   Aortic stenosis    Echo 06/2019: mod AS, mean 28 mmHg, peak velocity 3.6 m/s   Aortic valve sclerosis    CKD (chronic kidney disease)    Hypertension    Nonischemic cardiomyopathy    Tachy induced CM // TEE 12/2018: EF 30-35 // Echo 06/2019: EF 55-60 // Cor CTA 8/18: Ca score 0; no CAD   Persistent atrial fibrillation    Admx 12/19 w AF w RVR // R+L atrial thrombus on TEE - DCCV canceled; repeat TEE in 01/2019 w some  residual clot // Dofetilide Rx started 07/2019 >> NSR restored without DCCV // CHADS-VASc 4 (6 if include LAA clot) >> Apixaban     Current Medications: Current Meds  Medication Sig   acetaminophen (TYLENOL) 500 MG tablet Take 500 mg by mouth every 6 (six) hours as needed (for pain.).   calcium-vitamin D (OSCAL WITH D) 500-200 MG-UNIT TABS tablet Take 1 tablet by mouth daily.    diltiazem (CARDIZEM CD) 300 MG 24 hr capsule Take 1 capsule (300 mg total) by mouth daily.   dofetilide (TIKOSYN) 250 MCG capsule Take 1 capsule by mouth twice daily   ELIQUIS 5 MG TABS tablet  TAKE 1 TABLET BY MOUTH EVERY 12 HOURS   furosemide (LASIX) 40 MG tablet Take 1 tablet (40 mg total) by mouth daily. Please keep upcoming appt in September before anymore refills. Thank you   hydrALAZINE (APRESOLINE) 25 MG tablet Take 1 tablet (25 mg total) by mouth 3 (three) times daily. Please keep upcoming appt in September for future refills. Thank you   loratadine (CLARITIN) 10 MG tablet Take 10 mg by mouth daily as needed for allergies.   metoprolol tartrate (LOPRESSOR) 50 MG tablet Take 1 tablet by mouth twice daily   mometasone (ELOCON) 0.1 % ointment Apply topically as needed (for skin rash).   nitroGLYCERIN (NITROSTAT) 0.4 MG SL tablet Place 1 tablet (0.4 mg total) under the tongue every 5 (five) minutes as needed for chest pain.   Omega-3 1000 MG CAPS Take 1,000 mg by mouth daily.    VITAMIN D PO Take 1 tablet by mouth daily.     Allergies:   Patient has no known allergies.   Social History   Tobacco Use   Smoking status: Former Smoker    Quit date: 12/05/2011    Years since quitting: 8.6   Smokeless tobacco: Never Used  Vaping Use   Vaping Use: Never used  Substance Use Topics   Alcohol use: Yes    Alcohol/week: 1.0 standard drink    Types: 1 Glasses of wine per week    Comment: occasionally   Drug use: No     Family Hx: The patient's family history includes Arthritis in her maternal grandmother and mother; Asthma in her brother; Cancer in her brother and mother; Diabetes in her brother and mother; Early death in her brother; Heart disease in her father, maternal grandmother, and paternal grandmother; Heart murmur in her brother, brother, and mother; Hypertension in her brother and mother.  Review of Systems  Gastrointestinal: Negative for hematochezia and melena.  Genitourinary: Negative for hematuria.     EKGs/Labs/Other Test Reviewed:    EKG:  EKG is  not ordered today.  The ekg ordered today demonstrates n/a  Recent Labs: 11/07/2019: ALT 23;  Hemoglobin 13.1; Platelets 262 07/09/2020: BUN 25; Creatinine, Ser 1.29; Magnesium 2.0; Potassium 4.7; Sodium 138   Recent Lipid Panel Lab Results  Component Value Date/Time   CHOL 197 11/07/2019 10:58 AM   TRIG 128 11/07/2019 10:58 AM   HDL 48 11/07/2019 10:58 AM   CHOLHDL 4.1 11/07/2019 10:58 AM   CHOLHDL 3.2 09/26/2017 09:10 AM   LDLCALC 126 (H) 11/07/2019 10:58 AM   LDLCALC 95 09/26/2017 09:10 AM    Physical Exam:    VS:  BP (!) 110/40    Pulse 63    Ht 5\' 6"  (1.676 m)    Wt 185 lb (83.9 kg)    SpO2 98%    BMI 29.86 kg/m  Wt Readings from Last 3 Encounters:  08/11/20 185 lb (83.9 kg)  07/09/20 189 lb 9.6 oz (86 kg)  03/12/20 193 lb (87.5 kg)     Constitutional:      Appearance: Healthy appearance. Not in distress.  Neck:     Vascular: JVD normal.  Pulmonary:     Effort: Pulmonary effort is normal.     Breath sounds: No wheezing. No rales.  Cardiovascular:     Normal rate. Regular rhythm. Normal S1. Normal S2.     Murmurs: There is a grade 2 to 3/6 crescendo-decrescendo systolic murmur at the URSB.  Edema:    Peripheral edema absent.  Abdominal:     Palpations: Abdomen is soft.  Skin:    General: Skin is warm and dry.  Neurological:     Mental Status: Alert and oriented to person, place and time.     Cranial Nerves: Cranial nerves are intact.      ASSESSMENT & PLAN:    1. Persistent atrial fibrillation ([place]) 2. Acquired thrombophilia (HCC) Maintaining sinus rhythm on dofetilide therapy.  This is managed by the atrial fibrillation clinic.  Continue current dose of Apixaban.  Most recent hemoglobin, creatinine stable.  3. DCM (dilated cardiomyopathy) (HCC) Nonischemic cardiomyopathy.  Coronary CTA in 2018 demonstrated no CAD.  EF improved to normal in sinus rhythm.  Continue current dose of metoprolol tartrate.  4. Aortic valve stenosis, etiology of cardiac valve disease unspecified Moderate aortic stenosis by echocardiogram July 2020.  She is not having  any symptoms of severe aortic stenosis.  Arrange follow-up echocardiogram.  5. Essential hypertension The patient's blood pressure is controlled on her current regimen.  Continue current therapy.   6. Stage 3a chronic kidney disease Most recent creatinine stable.   Dispo:  Return in about 6 months (around 02/08/2021) for Routine Follow Up, w/ Dr. Burt Knack, or Richardson Dopp, PA-C, in person.   Medication Adjustments/Labs and Tests Ordered: Current medicines are reviewed at length with the patient today.  Concerns regarding medicines are outlined above.  Tests Ordered: Orders Placed This Encounter  Procedures   ECHOCARDIOGRAM COMPLETE   Medication Changes: No orders of the defined types were placed in this encounter.   Signed, Richardson Dopp, PA-C  08/11/2020 4:30 PM    [facility] [address], [place], [place]  [address] Phone: [phone]; Fax: [phone]

## 2020-08-11 NOTE — Patient Instructions (Signed)
Medication Instructions:  Your physician recommends that you continue on your current medications as directed. Please refer to the Current Medication list given to you today.  *If you need a refill on your cardiac medications before your next appointment, please call your pharmacy*  Lab Work: None ordered today  Testing/Procedures: Your physician has requested that you have an echocardiogram. Echocardiography is a painless test that uses sound waves to create images of your heart. It provides your doctor with information about the size and shape of your heart and how well your heart's chambers and valves are working. This procedure takes approximately one hour. There are no restrictions for this procedure.  Follow-Up: At [facility], you and your health needs are our priority.  As part of our continuing mission to provide you with exceptional heart care, we have created designated Provider Care Teams.  These Care Teams include your primary Cardiologist (physician) and Advanced Practice Providers (APPs -  Physician Assistants and Nurse Practitioners) who all work together to provide you with the care you need, when you need it.  Your next appointment:   6 month(s)  The format for your next appointment:   In Person  Provider:   You may see Sherren Mocha, MD or Richardson Dopp, PA-C

## 2020-08-16 ENCOUNTER — Other Ambulatory Visit: Payer: Self-pay

## 2020-08-16 MED ORDER — DOFETILIDE 250 MCG PO CAPS: 250.0000 ug | ORAL_CAPSULE | Freq: Two times a day (BID) | ORAL | 6 refills | Status: DC

## 2020-08-20 ENCOUNTER — Other Ambulatory Visit: Payer: Self-pay

## 2020-08-20 ENCOUNTER — Ambulatory Visit (HOSPITAL_COMMUNITY): Payer: Medicare PPO | Attending: Cardiology

## 2020-08-20 DIAGNOSIS — I350 Nonrheumatic aortic (valve) stenosis: Secondary | ICD-10-CM | POA: Diagnosis present

## 2020-08-20 LAB — ECHOCARDIOGRAM COMPLETE
AR max vel: 0.66 cm2
AV Area VTI: 0.61 cm2
AV Area mean vel: 0.67 cm2
AV Mean grad: 36 mmHg
AV Peak grad: 53 mmHg
Ao pk vel: 3.64 m/s
Area-P 1/2: 2.31 cm2
S' Lateral: 2.1 cm

## 2020-08-23 ENCOUNTER — Other Ambulatory Visit: Payer: Self-pay | Admitting: Cardiovascular Disease

## 2020-08-24 ENCOUNTER — Encounter: Payer: Self-pay | Admitting: Physician Assistant

## 2020-09-16 ENCOUNTER — Other Ambulatory Visit: Payer: Self-pay | Admitting: Physician Assistant

## 2020-09-19 ENCOUNTER — Other Ambulatory Visit: Payer: Self-pay | Admitting: Physician Assistant

## 2020-09-21 ENCOUNTER — Telehealth: Payer: Self-pay | Admitting: Physician Assistant

## 2020-09-21 NOTE — Telephone Encounter (Signed)
Patient called to check on the status of her refill of hydrALAZINE (APRESOLINE) 25 MG tablet..  Advised patient that refill was sent to the pharmacy today at 11:51 am.

## 2020-11-08 ENCOUNTER — Encounter: Payer: Medicare PPO | Admitting: Family Medicine

## 2020-11-11 ENCOUNTER — Encounter: Payer: Medicare PPO | Admitting: Family Medicine

## 2020-11-23 ENCOUNTER — Encounter: Payer: Self-pay | Admitting: Internal Medicine

## 2020-11-23 ENCOUNTER — Other Ambulatory Visit: Payer: Self-pay

## 2020-11-23 ENCOUNTER — Ambulatory Visit: Payer: Medicare PPO | Attending: Family Medicine | Admitting: Internal Medicine

## 2020-11-23 ENCOUNTER — Ambulatory Visit: Payer: Medicare PPO | Admitting: Internal Medicine

## 2020-11-23 DIAGNOSIS — Z1231 Encounter for screening mammogram for malignant neoplasm of breast: Secondary | ICD-10-CM

## 2020-11-23 DIAGNOSIS — Z131 Encounter for screening for diabetes mellitus: Secondary | ICD-10-CM | POA: Diagnosis not present

## 2020-11-23 DIAGNOSIS — Z0000 Encounter for general adult medical examination without abnormal findings: Secondary | ICD-10-CM

## 2020-11-23 MED ORDER — HYDRALAZINE HCL 50 MG PO TABS: 50.0000 mg | ORAL_TABLET | Freq: Three times a day (TID) | ORAL | 3 refills | Status: DC

## 2020-11-23 NOTE — Progress Notes (Signed)
Subjective:    Tracy James is a 69 y.o. female who presents for a Welcome to Medicare exam.  She does take bp at home but she can't remember numbers.   Review of Systems She really does not have any complaints. She has known cardiovascular diagnoses but has no complaints. She exercises regularly         Objective:    There were no vitals filed for this visit.There is no height or weight on file to calculate BMI.  Medications Outpatient Encounter Medications as of 11/23/2020  Medication Sig   acetaminophen (TYLENOL) 500 MG tablet Take 500 mg by mouth every 6 (six) hours as needed (for pain.).   calcium-vitamin D (OSCAL WITH D) 500-200 MG-UNIT TABS tablet Take 1 tablet by mouth daily.    diltiazem (CARDIZEM CD) 300 MG 24 hr capsule Take 1 capsule (300 mg total) by mouth daily.   dofetilide (TIKOSYN) 250 MCG capsule Take 1 capsule (250 mcg total) by mouth 2 (two) times daily.   ELIQUIS 5 MG TABS tablet TAKE 1 TABLET BY MOUTH EVERY 12 HOURS   furosemide (LASIX) 40 MG tablet Take 1 tablet (40 mg total) by mouth daily.   hydrALAZINE (APRESOLINE) 25 MG tablet Take 1 tablet (25 mg total) by mouth 3 (three) times daily.   loratadine (CLARITIN) 10 MG tablet Take 10 mg by mouth daily as needed for allergies.   metoprolol tartrate (LOPRESSOR) 50 MG tablet Take 1 tablet by mouth twice daily   mometasone (ELOCON) 0.1 % ointment Apply topically as needed (for skin rash).   nitroGLYCERIN (NITROSTAT) 0.4 MG SL tablet Place 1 tablet (0.4 mg total) under the tongue every 5 (five) minutes as needed for chest pain.   Omega-3 1000 MG CAPS Take 1,000 mg by mouth daily.    VITAMIN D PO Take 1 tablet by mouth daily.   No facility-administered encounter medications on file as of 11/23/2020.     History: Past Medical History:  Diagnosis Date   Allergy    SEASONAL   Aortic stenosis    Echo 06/2019: mod AS, mean 28 mmHg, peak velocity 3.6 m/s // Echo 9/21: EF 60-65 no RWMA, moderate  LVH, normal RVSF, trivial MR, trivial AI, moderate aortic stenosis (mean gradient 36 mmHg; V-max 3.64 m/s; DI 0.18) [reviewed with Dr. Burt Knack - mod AS; rpt echo 1 yr]     CKD (chronic kidney disease)    Hypertension    Nonischemic cardiomyopathy    Tachy induced CM // TEE 12/2018: EF 30-35 // Echo 06/2019: EF 55-60 // Cor CTA 8/18: Ca score 0; no CAD   Persistent atrial fibrillation    Admx 12/19 w AF w RVR // R+L atrial thrombus on TEE - DCCV canceled; repeat TEE in 01/2019 w some residual clot // Dofetilide Rx started 07/2019 >> NSR restored without DCCV // CHADS-VASc 4 (6 if include LAA clot) >> Apixaban    Past Surgical History:  Procedure Laterality Date   TEE WITHOUT CARDIOVERSION N/A 12/06/2018   Procedure: TRANSESOPHAGEAL ECHOCARDIOGRAM (TEE);  Surgeon: Lelon Perla, MD;  Location: [facility] ENDOSCOPY;  Service: Cardiovascular;  Laterality: N/A;   TEE WITHOUT CARDIOVERSION N/A 01/20/2019   Procedure: TRANSESOPHAGEAL ECHOCARDIOGRAM (TEE);  Surgeon: [name], MD;  Location: [facility] ENDOSCOPY;  Service: Cardiovascular;  Laterality: N/A;   TONSILLECTOMY      Family History  Problem Relation Age of Onset   Arthritis Mother    Cancer Mother        colon  cancer   Diabetes Mother    Hypertension Mother    Heart murmur Mother    Cancer Brother        LUNG- smoking, [place]   Early death Brother    Heart murmur Brother    Arthritis Maternal Grandmother    Heart disease Maternal Grandmother        massive heart attack   Heart disease Paternal Grandmother    Asthma Brother    Diabetes Brother    Hypertension Brother    Heart murmur Brother    Heart disease Father        anuersym- ruptered   Social History   Occupational History   Not on file  Tobacco Use   Smoking status: Former Smoker    Quit date: 12/05/2011    Years since quitting: 8.9   Smokeless tobacco: Never Used  Vaping Use   Vaping Use: Never used  Substance and Sexual Activity   Alcohol  use: Yes    Alcohol/week: 1.0 standard drink    Types: 1 Glasses of wine per week    Comment: occasionally   Drug use: No   Sexual activity: Not Currently    Birth control/protection: None    Tobacco Counseling Counseling given: Not Answered   Immunizations and Health Maintenance Immunization History  Administered Date(s) Administered   Pneumococcal Polysaccharide-23 01/15/2019   Health Maintenance Due  Topic Date Due   COVID-19 Vaccine (1) Never done   COLONOSCOPY  07/10/2016   PNA vac Low Risk Adult (2 of 2 - PCV13) 01/16/2020    Activities of Daily Living No flowsheet data found.  Physical Exam  BP (!) 147/82    Pulse 62    Temp 98.2 F (36.8 C)    Resp 20    Ht 5\' 7"  (1.702 m)    Wt 189 lb (85.7 kg)    SpO2 96%    BMI 29.60 kg/m    Well-developed well-nourished female in no acute distress. HEENT exam atraumatic, normocephalic, extraocular muscles are intact. Neck is supple. No jugular venous distention no thyromegaly. Chest clear to auscultation without increased work of breathing. Cardiac exam S1 and S2 are regular. Abdominal exam active bowel sounds, soft, nontender. Extremities no edema. Neurologic exam she is alert without any motor sensory deficits. Gait is normal. , or other factors deemed appropriate based on the beneficiary's medical and social history and current clinical standards.  Advanced Directives:      Assessment:    This is a routine wellness examination for this patient . She is well and needs no other health maintenance issues addressed  Vision/Hearing screen No exam data present  Dietary issues and exercise activities discussed:     Goals   None    Depression Screen PHQ 2/9 Scores 01/15/2019 10/04/2018 08/16/2018 08/09/2018  PHQ - 2 Score 0 0 0 0  PHQ- 9 Score 2 0 1 0     Fall Risk Fall Risk  01/15/2019  Falls in the past year? 0    Cognitive Function: MMSE - Mini Mental State Exam 11/23/2020  Orientation to time 5  Orientation  to Place 5  Attention/ Calculation 5        Patient Care Team: Antony Blackbird, MD (Inactive) as PCP - General (Family Medicine) Sherren Mocha, MD as PCP - Cardiology (Cardiology)     Plan:  Health maint utd bp is elevated. I'm not crazy about tid meds but she is complaint. Will increase hydralazine to 50 mg po tid.  I have personally reviewed and noted the following in the patients chart:    Medical and social history  Use of alcohol, tobacco or illicit drugs   Current medications and supplements  Functional ability and status  Nutritional status  Physical activity  Advanced directives  List of other physicians  Hospitalizations, surgeries, and ER visits in previous 12 months  Vitals  Screenings to include cognitive, depression, and falls  Referrals and appointments  In addition, I have reviewed and discussed with patient certain preventive protocols, quality metrics, and best practice recommendations. A written personalized care plan for preventive services as well as general preventive health recommendations were provided to patient.     Lisabeth Pick, MD 11/23/2020

## 2020-11-23 NOTE — Patient Instructions (Signed)
Increase your hydralazine to 50mg  three times daily. This has been updated at your pharmacy. You can use the remaining of your medications by taking two hydralazine three times daily.

## 2020-12-01 ENCOUNTER — Other Ambulatory Visit (HOSPITAL_COMMUNITY): Payer: Self-pay | Admitting: *Deleted

## 2020-12-01 MED ORDER — DILTIAZEM HCL ER COATED BEADS 300 MG PO CP24: 300.0000 mg | ORAL_CAPSULE | Freq: Every day | ORAL | 1 refills | Status: DC

## 2020-12-07 ENCOUNTER — Other Ambulatory Visit: Payer: Self-pay | Admitting: Internal Medicine

## 2020-12-07 DIAGNOSIS — Z1231 Encounter for screening mammogram for malignant neoplasm of breast: Secondary | ICD-10-CM

## 2020-12-21 ENCOUNTER — Other Ambulatory Visit: Payer: Self-pay | Admitting: Physician Assistant

## 2021-01-13 NOTE — Progress Notes (Signed)
Primary Care Physician: Antony Blackbird, MD (Inactive) Primary Cardiologist: Dr Burt Knack Primary Electrophysiologist: Dr Rayann Heman Referring Physician: Richardson Dopp PA-C   Tracy James is a 70 y.o. female with a history of persistent atrial fibrillation, tachycardia induced cardiomyopathy, AS, HTN, and tobacco abuse who presents for follow up in the [facility]. She was admitted in 11/2018 with AF with RVR. 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 Unfortunately, the TEE continued to show residual clot in the LA. Therefore, DCCV was not performed. She was seen infollow upon 02/2019 and her HR was uncontrolled. Unfortunately, repeat DCCV was not arranged due to COVID-19 restrictions (elective procedures on hold). She was placed on Digoxin for rate control. She was admitted 07/15/19-07/18/19 for dofetilide loading. She is on Eliquis for a CHADS2VASC score of 4.   On follow up today, patient reports she has done very well since her last visit. She continues to be active by walking regularly. She denies any bleeding issues on anticoagulation.   Today, she denies symptoms of palpitations, chest pain, shortness of breath, orthopnea, PND, lower extremity edema, dizziness, presyncope, syncope, snoring, daytime somnolence, bleeding, or neurologic sequela. The patient is tolerating medications without difficulties and is otherwise without complaint today.    Atrial Fibrillation Risk Factors:  she does have symptoms of sleep apnea. she does not have a history of rheumatic fever. she does not have a history of alcohol use. The patient does not have a history of early familial atrial fibrillation or other arrhythmias.  she has a BMI of Body mass index is 29.19 kg/m.Marland Kitchen Filed Weights   01/14/21 0914  Weight: 84.6 kg     Family History  Problem Relation Age of Onset  . Arthritis Mother   . Cancer Mother        colon cancer  . Diabetes Mother   . Hypertension Mother   . Heart murmur Mother   . Early death Brother   . Heart murmur Brother   . Lung cancer Brother   . Arthritis Maternal Grandmother   . Heart disease Maternal Grandmother        massive heart attack  . Heart disease Paternal Grandmother   . Asthma Brother   . Diabetes Brother   . Hypertension Brother   . Heart murmur Brother   . Heart disease Father        anuersym- ruptered     Atrial Fibrillation Management history:  Previous antiarrhythmic drugs: dofetilide Previous cardioversions: none Previous ablations: none CHADS2VASC score: 4 Anticoagulation history: Eliquis   Past Medical History:  Diagnosis Date  . Allergy    SEASONAL  . Aortic stenosis    Echo 06/2019: mod AS, mean 28 mmHg, peak velocity 3.6 m/s // Echo 9/21: EF 60-65 no RWMA, moderate LVH, normal RVSF, trivial MR, trivial AI, moderate aortic stenosis (mean gradient 36 mmHg; V-max 3.64 m/s; DI 0.18) [reviewed with Dr. Burt Knack - mod AS; rpt echo 1 yr]    . CKD (chronic kidney disease)   . Hypertension   . Nonischemic cardiomyopathy    Tachy induced CM // TEE 12/2018: EF 30-35 // Echo 06/2019: EF 55-60 // Cor CTA 8/18: Ca score 0; no CAD  . Persistent atrial fibrillation    Admx 12/19 w AF w RVR // R+L atrial thrombus on TEE - DCCV canceled; repeat TEE in 01/2019 w  some residual clot // Dofetilide Rx started 07/2019 >> NSR restored without DCCV // CHADS-VASc 4 (6 if include LAA clot) >> Apixaban    Past Surgical History:  Procedure Laterality Date  . TEE WITHOUT CARDIOVERSION N/A 12/06/2018   Procedure: TRANSESOPHAGEAL ECHOCARDIOGRAM (TEE);  Surgeon: Lelon Perla, MD;  Location: [facility] ENDOSCOPY;  Service: Cardiovascular;  Laterality: N/A;  . TEE WITHOUT CARDIOVERSION N/A 01/20/2019   Procedure: TRANSESOPHAGEAL ECHOCARDIOGRAM (TEE);  Surgeon: [name],  MD;  Location: [facility] ENDOSCOPY;  Service: Cardiovascular;  Laterality: N/A;  . TONSILLECTOMY      Current Outpatient Medications  Medication Sig Dispense Refill  . acetaminophen (TYLENOL) 500 MG tablet Take 500 mg by mouth every 6 (six) hours as needed (for pain.).    Marland Kitchen calcium-vitamin D (OSCAL WITH D) 500-200 MG-UNIT TABS tablet Take 1 tablet by mouth daily.     Marland Kitchen diltiazem (CARDIZEM CD) 300 MG 24 hr capsule Take 1 capsule (300 mg total) by mouth daily. 90 capsule 1  . dofetilide (TIKOSYN) 250 MCG capsule Take 1 capsule (250 mcg total) by mouth 2 (two) times daily. 60 capsule 6  . ELIQUIS 5 MG TABS tablet TAKE 1 TABLET BY MOUTH EVERY 12 HOURS 60 tablet 11  . furosemide (LASIX) 40 MG tablet Take 1 tablet by mouth once daily 90 tablet 2  . hydrALAZINE (APRESOLINE) 50 MG tablet Take 1 tablet (50 mg total) by mouth 3 (three) times daily. 270 tablet 3  . loratadine (CLARITIN) 10 MG tablet Take 10 mg by mouth daily as needed for allergies.    . metoprolol tartrate (LOPRESSOR) 50 MG tablet Take 2 tablets by mouth twice daily 360 tablet 0  . mometasone (ELOCON) 0.1 % ointment Apply topically as needed (for skin rash).    . nitroGLYCERIN (NITROSTAT) 0.4 MG SL tablet Place 1 tablet (0.4 mg total) under the tongue every 5 (five) minutes as needed for chest pain. 20 tablet 0  . Omega-3 1000 MG CAPS Take 1,000 mg by mouth daily.     Marland Kitchen VITAMIN D PO Take 1 tablet by mouth daily.     No current facility-administered medications for this encounter.    No Known Allergies  Social History   Socioeconomic History  . Marital status: Divorced    Spouse name: Not on file  . Number of children: Not on file  . Years of education: Not on file  . Highest education level: Not on file  Occupational History  . Not on file  Tobacco Use  . Smoking status: Former Smoker    Quit date: 12/05/2011    Years since quitting: 9.1  . Smokeless tobacco: Never Used  Vaping Use  . Vaping Use: Never used  Substance and  Sexual Activity  . Alcohol use: Yes    Alcohol/week: 1.0 standard drink    Types: 1 Glasses of wine per week    Comment: occasionally  . Drug use: No  . Sexual activity: Not Currently    Birth control/protection: None  Other Topics Concern  . Not on file  Social History Narrative  . Not on file   Social Determinants of Health   Financial Resource Strain: Not on file  Food Insecurity: Not on file  Transportation Needs: Not on file  Physical Activity: Not on file  Stress: Not on file  Social Connections: Not on file  Intimate Partner Violence: Not on file     ROS- All systems are reviewed and negative except as per the HPI above.  Physical Exam: Vitals:   01/14/21 0914  BP: 140/60  Pulse: (!) 51  Weight: 84.6 kg  Height: 5\' 7"  (1.702 m)    GEN- The patient is well appearing, alert and oriented x 3 today.   HEENT-head normocephalic, atraumatic, sclera clear, conjunctiva pink, hearing intact, trachea midline. Lungs- Clear to ausculation bilaterally, normal work of breathing Heart- Regular rate and rhythm, bradycardia, no murmurs, rubs or gallops  GI- soft, NT, ND, + BS Extremities- no clubbing, cyanosis, or edema MS- no significant deformity or atrophy Skin- no rash or lesion Psych- euthymic mood, full affect Neuro- strength and sensation are intact   Wt Readings from Last 3 Encounters:  01/14/21 84.6 kg  11/23/20 85.7 kg  11/23/20 85.7 kg    EKG today demonstrates  SB Vent. rate 51 BPM PR interval 168 ms QRS duration 80 ms QT/QTc 516/475 ms  Echo 06/18/19 demonstrated  1. Moderate hypokinesis of the left ventricular, mid-apical inferior wall.  2. The left ventricle has normal systolic function, with an ejection fraction of 55-60%. The cavity size was normal. There is moderately increased left ventricular wall thickness. Left ventricular diastolic Doppler parameters are indeterminate.  3. The right ventricle has normal systolic function. The cavity was  normal. There is no increase in right ventricular wall thickness.  4. Left atrial size was mildly dilated.  5. The aortic valve is tricuspid. Mild thickening of the aortic valve. Mild calcification of the aortic valve. Aortic valve regurgitation is trivial by color flow Doppler. Moderate stenosis of the aortic valve.  6. Peak velocity 3.74m/s, mean gradient 66mmHg.  Epic records are reviewed at length today  Assessment and Plan:  1. Persistent atrial fibrillation Patient appears to be maintaining SR. Continue dofetilide 250 mcg BID. QT stable. Continue metoprolol 50 mg BID Continue Eliquis 5 mg BID Check bmet/mag   This patients CHA2DS2-VASc Score and unadjusted Ischemic Stroke Rate (% per year) is equal to 4.8 % stroke rate/year from a score of 4  Above score calculated as 1 point each if present [CHF, HTN, DM, Vascular=MI/PAD/Aortic Plaque, Age if 65-74, or Female] Above score calculated as 2 points each if present [Age > 75, or Stroke/TIA/TE]  2. Cardiomyopathy  EF recovered, 55-60% on last echo. No signs or symptoms of fluid overload.  3. HTN Stable, no changes today.   Follow up with Dr Burt Knack as scheduled. AF clinic in 6 months.    [facility] [address] [place], [place] [address] [phone] 01/14/2021 9:35 AM

## 2021-01-14 ENCOUNTER — Other Ambulatory Visit: Payer: Self-pay

## 2021-01-14 ENCOUNTER — Encounter (HOSPITAL_COMMUNITY): Payer: Self-pay | Admitting: Physician Assistant

## 2021-01-14 ENCOUNTER — Ambulatory Visit
Admission: RE | Admit: 2021-01-14 | Discharge: 2021-01-14 | Disposition: A | Discharge status: Home / Self Care | Payer: Medicare PPO | Source: Ambulatory Visit | Attending: Internal Medicine | Admitting: Internal Medicine

## 2021-01-14 ENCOUNTER — Ambulatory Visit (HOSPITAL_COMMUNITY)
Admission: RE | Admit: 2021-01-14 | Discharge: 2021-01-14 | Disposition: A | Discharge status: Home / Self Care | Payer: Medicare PPO | Source: Ambulatory Visit | Attending: Physician Assistant | Admitting: Physician Assistant

## 2021-01-14 VITALS — BP 140/60 | HR 51 | Ht 67.0 in | Wt 186.4 lb

## 2021-01-14 DIAGNOSIS — I4819 Other persistent atrial fibrillation: Secondary | ICD-10-CM | POA: Diagnosis not present

## 2021-01-14 DIAGNOSIS — Z1231 Encounter for screening mammogram for malignant neoplasm of breast: Secondary | ICD-10-CM | POA: Diagnosis not present

## 2021-01-14 DIAGNOSIS — D6869 Other thrombophilia: Secondary | ICD-10-CM | POA: Diagnosis not present

## 2021-01-14 DIAGNOSIS — Z7901 Long term (current) use of anticoagulants: Secondary | ICD-10-CM | POA: Insufficient documentation

## 2021-01-14 DIAGNOSIS — I700 Atherosclerosis of aorta: Secondary | ICD-10-CM | POA: Insufficient documentation

## 2021-01-14 DIAGNOSIS — I10 Essential (primary) hypertension: Secondary | ICD-10-CM | POA: Diagnosis not present

## 2021-01-14 DIAGNOSIS — I429 Cardiomyopathy, unspecified: Secondary | ICD-10-CM | POA: Insufficient documentation

## 2021-01-14 DIAGNOSIS — Z87891 Personal history of nicotine dependence: Secondary | ICD-10-CM | POA: Insufficient documentation

## 2021-01-14 DIAGNOSIS — I4891 Unspecified atrial fibrillation: Secondary | ICD-10-CM | POA: Diagnosis present

## 2021-01-14 LAB — BASIC METABOLIC PANEL
Anion gap: 9 (ref 5–15)
BUN: 22 mg/dL (ref 8–23)
CO2: 26 mmol/L (ref 22–32)
Calcium: 9.6 mg/dL (ref 8.9–10.3)
Chloride: 103 mmol/L (ref 98–111)
Creatinine, Ser: 1.48 mg/dL — ABNORMAL HIGH (ref 0.44–1.00)
GFR, Estimated: 39 mL/min — ABNORMAL LOW (ref >60)
Glucose, Bld: 108 mg/dL — ABNORMAL HIGH (ref 70–99)
Potassium: 4.5 mmol/L (ref 3.5–5.1)
Sodium: 138 mmol/L (ref 135–145)

## 2021-01-14 LAB — MAGNESIUM: Magnesium: 2.1 mg/dL (ref 1.7–2.4)

## 2021-01-14 IMAGING — MG MM DIGITAL SCREENING BILAT W/ TOMO AND CAD
8 series · 8 of 24 positions shown · non-contrast
Comparison: Previous exam(s).

CLINICAL DATA: Screening.

EXAM:
DIGITAL SCREENING BILATERAL MAMMOGRAM WITH TOMOSYNTHESIS AND CAD
TECHNIQUE: Bilateral screening digital craniocaudal and mediolateral oblique
mammograms were obtained. Bilateral screening digital breast
tomosynthesis was performed. The images were evaluated with
computer-aided detection.

[R MLO synth-2D]
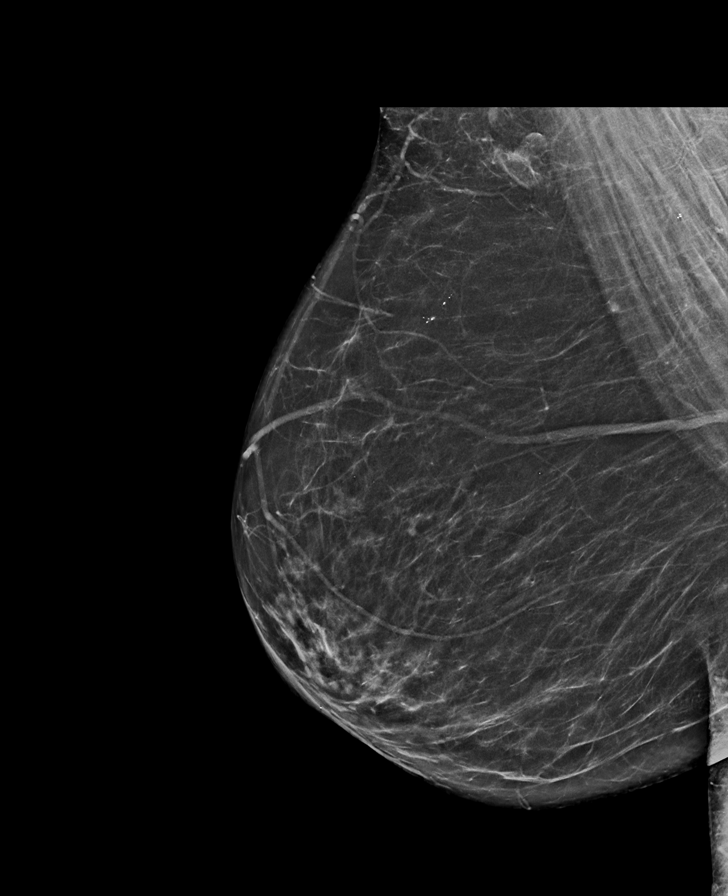

[L MLO synth-2D]
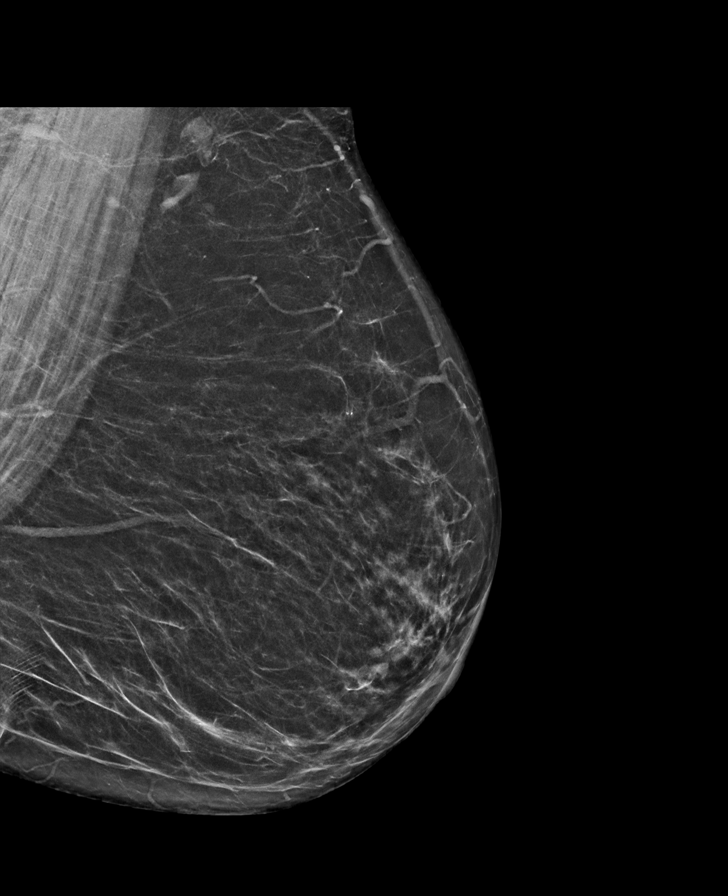

[L CC synth-2D]
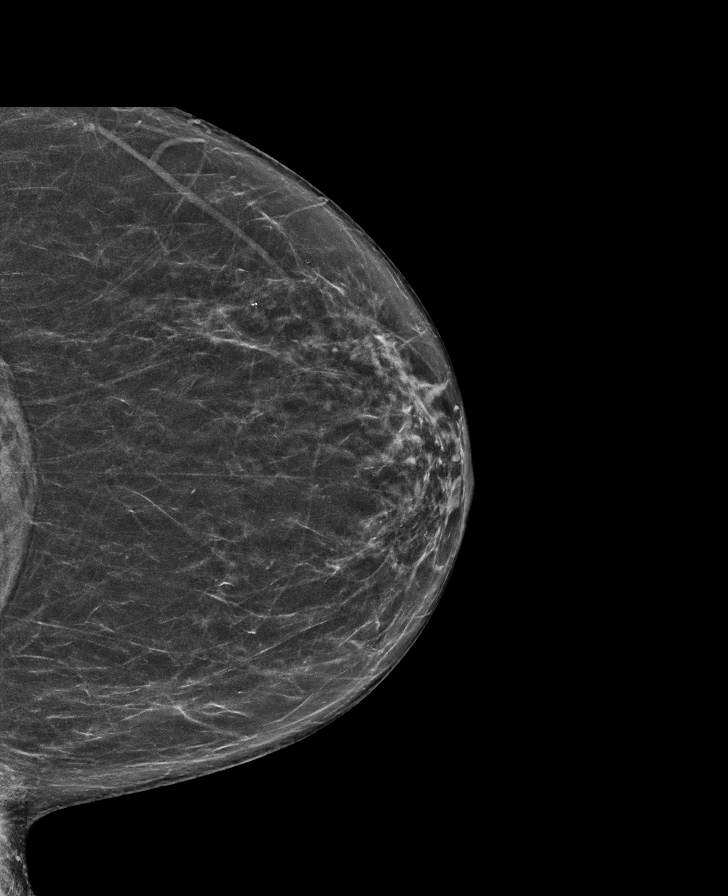

[R CC synth-2D]
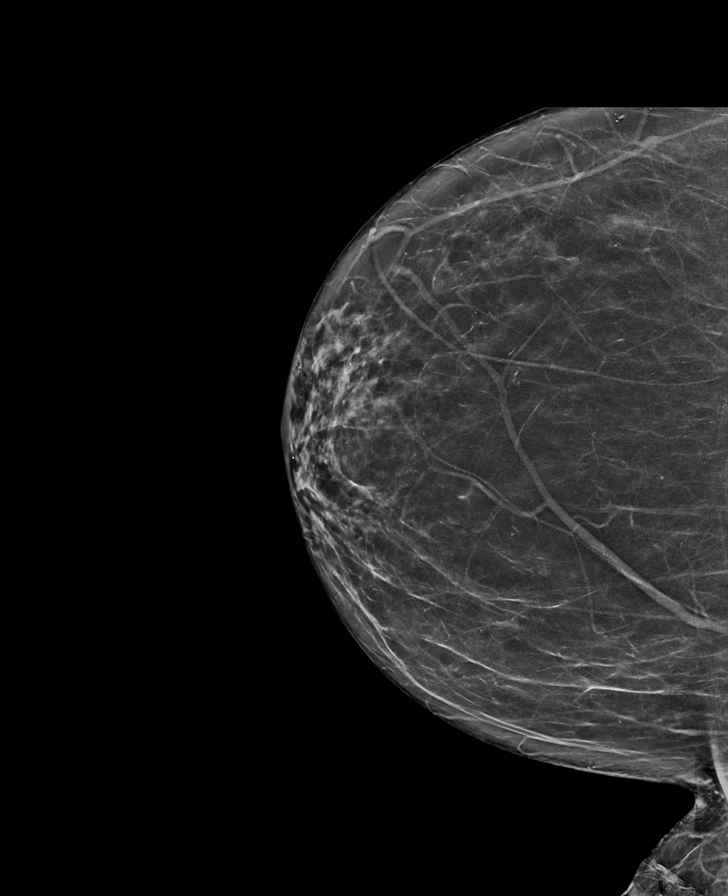

[R CC tomo · tomo slice 33/66.0]
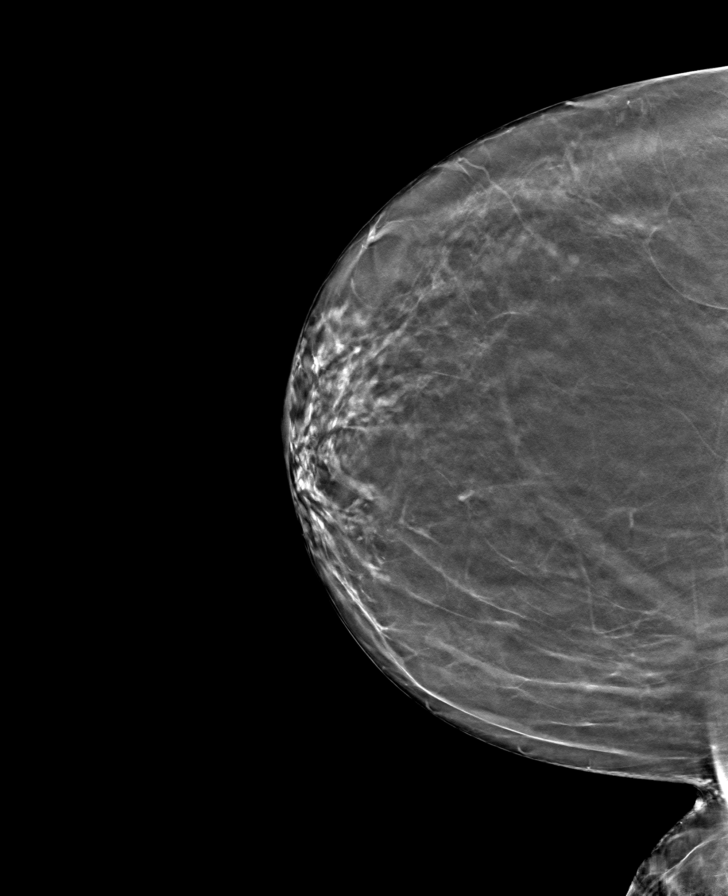

[L CC tomo · tomo slice 33/66.0]
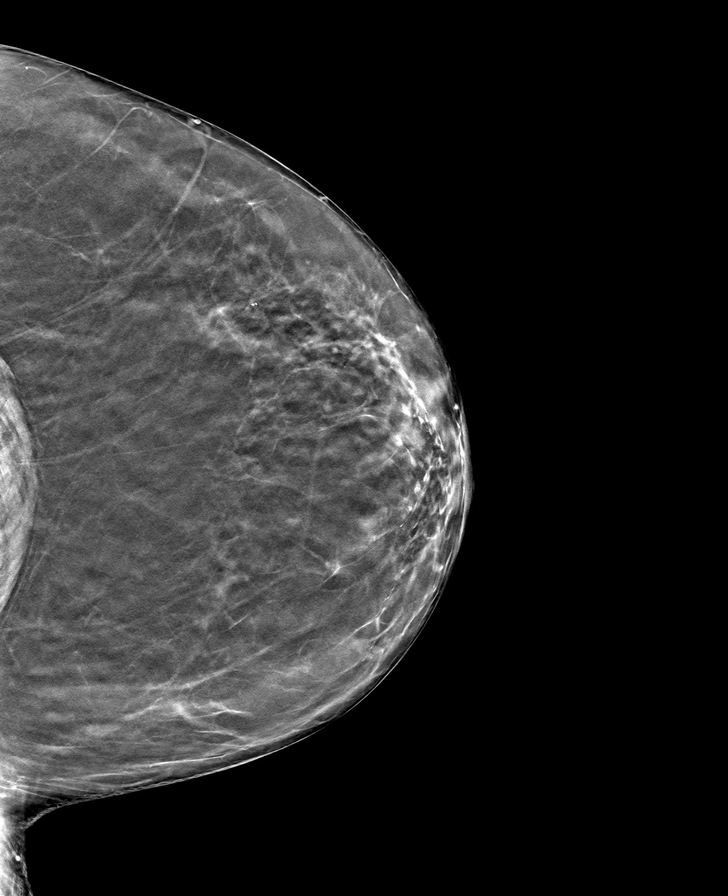

[L MLO tomo · tomo slice 35/70.0]
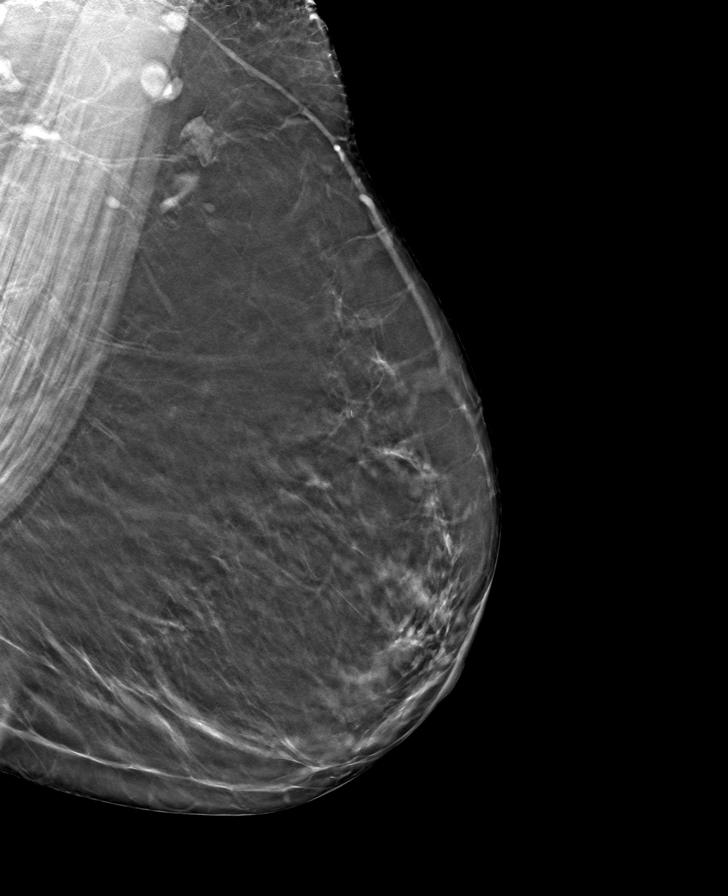

[R MLO tomo · tomo slice 37/74.0]
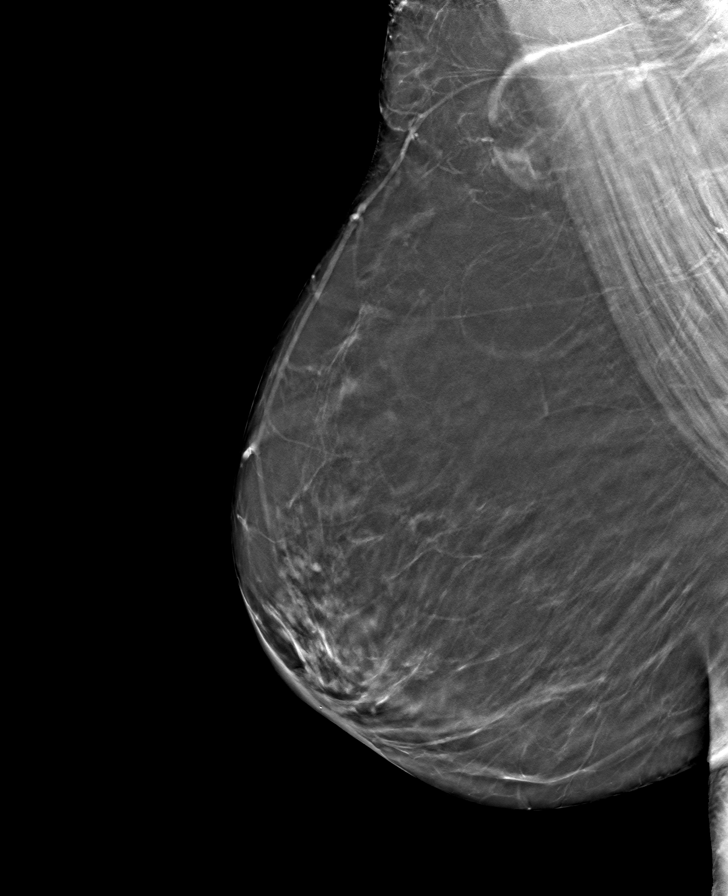

[8 of 24 positions shown; findings below may reference images not displayed]

ACR Breast Density Category b: There are scattered areas of
fibroglandular density.
FINDINGS: There are no findings suspicious for malignancy.
IMPRESSION: No mammographic evidence of malignancy. A result letter of this
screening mammogram will be mailed directly to the patient.

RECOMMENDATION:
Screening mammogram in one year. (Code:[BY])

BI-RADS CATEGORY  1: Negative.

## 2021-02-10 ENCOUNTER — Ambulatory Visit
Admission: EM | Admit: 2021-02-10 | Discharge: 2021-02-10 | Disposition: A | Discharge status: Home / Self Care | Payer: Medicare PPO | Attending: Family Medicine | Admitting: Family Medicine

## 2021-02-10 ENCOUNTER — Other Ambulatory Visit: Payer: Self-pay

## 2021-02-10 DIAGNOSIS — H11421 Conjunctival edema, right eye: Secondary | ICD-10-CM

## 2021-02-10 DIAGNOSIS — H5789 Other specified disorders of eye and adnexa: Secondary | ICD-10-CM | POA: Diagnosis not present

## 2021-02-10 MED ORDER — OLOPATADINE HCL 0.2 % OP SOLN: 1.0000 [drp] | Freq: Once | OPHTHALMIC | 0 refills | Status: AC

## 2021-02-10 MED ORDER — AMOXICILLIN-POT CLAVULANATE 875-125 MG PO TABS
1.0000 | ORAL_TABLET | Freq: Two times a day (BID) | ORAL | 0 refills | Status: DC
Start: 2021-02-10 — End: 2021-02-21

## 2021-02-10 NOTE — Discharge Instructions (Addendum)
Use the eyedrops daily.  Continue the Claritin.  Take the antibiotics as prescribed Cool compresses to the eye Follow up as needed for continued or worsening symptoms

## 2021-02-10 NOTE — ED Provider Notes (Signed)
Tracy James    CSN: [id number] Arrival date & time: 02/10/21  1241      History   Chief Complaint Chief Complaint  Patient presents with  . Facial Swelling    right    HPI Tracy James is a 70 y.o. female.   Pt is a 70 year old female that presents with right eye swelling. She woke up this way. Periorbital. No tenderness. Has been having allergy symptoms to include watery eyes, nasal congestion. Rubbing eyes a lot. No fever. No trouble with vision. Used warm compress.      Past Medical History:  Diagnosis Date  . Allergy    SEASONAL  . Aortic stenosis    Echo 06/2019: mod AS, mean 28 mmHg, peak velocity 3.6 m/s // Echo 9/21: EF 60-65 no RWMA, moderate LVH, normal RVSF, trivial MR, trivial AI, moderate aortic stenosis (mean gradient 36 mmHg; V-max 3.64 m/s; DI 0.18) [reviewed with Dr. Burt Knack - mod AS; rpt echo 1 yr]    . CKD (chronic kidney disease)   . Hypertension   . Nonischemic cardiomyopathy    Tachy induced CM // TEE 12/2018: EF 30-35 // Echo 06/2019: EF 55-60 // Cor CTA 8/18: Ca score 0; no CAD  . Persistent atrial fibrillation    Admx 12/19 w AF w RVR // R+L atrial thrombus on TEE - DCCV canceled; repeat TEE in 01/2019 w some residual clot // Dofetilide Rx started 07/2019 >> NSR restored without DCCV // CHADS-VASc 4 (6 if include LAA clot) >> Apixaban     Patient Active Problem List   Diagnosis Date Noted  . Secondary hypercoagulable state ([place]) 12/02/2019  . Chronic kidney disease, stage 3a ([place]) 05/19/2019  . Chronic systolic CHF (congestive heart failure) ([place]) 02/27/2019  . Thrombus of atrial appendage   . DCM (dilated cardiomyopathy) ([place]) 01/17/2019  . Atrial thrombus without antecedent myocardial infarction 01/15/2019  . Persistent atrial fibrillation ([place]) 12/03/2018  . Borderline diabetic 09/22/2016  . Overweight (BMI 25.0-29.9) 09/22/2016  . Aortic stenosis 09/22/2016  . Hypertriglyceridemia 03/20/2016  . Essential hypertension 09/21/2015     Past Surgical History:  Procedure Laterality Date  . TEE WITHOUT CARDIOVERSION N/A 12/06/2018   Procedure: TRANSESOPHAGEAL ECHOCARDIOGRAM (TEE);  Surgeon: Lelon Perla, MD;  Location: [facility] ENDOSCOPY;  Service: Cardiovascular;  Laterality: N/A;  . TEE WITHOUT CARDIOVERSION N/A 01/20/2019   Procedure: TRANSESOPHAGEAL ECHOCARDIOGRAM (TEE);  Surgeon: [name], MD;  Location: [facility] ENDOSCOPY;  Service: Cardiovascular;  Laterality: N/A;  . TONSILLECTOMY      OB History   No obstetric history on file.      Home Medications    Prior to Admission medications   Medication Sig Start Date End Date Taking? Authorizing Provider  amoxicillin-clavulanate (AUGMENTIN) 875-125 MG tablet Take 1 tablet by mouth every 12 (twelve) hours. 02/10/21  Yes Luqman Perrelli A, NP  loratadine (CLARITIN) 10 MG tablet Take 10 mg by mouth daily as needed for allergies.   Yes [provider]  acetaminophen (TYLENOL) 500 MG tablet Take 500 mg by mouth every 6 (six) hours as needed (for pain.).    [provider]  calcium-vitamin D (OSCAL WITH D) 500-200 MG-UNIT TABS tablet Take 1 tablet by mouth daily.     [provider]  diltiazem (CARDIZEM CD) 300 MG 24 hr capsule Take 1 capsule (300 mg total) by mouth daily. 12/01/20   Fenton, Clint R, PA  dofetilide (TIKOSYN) 250 MCG capsule Take 1 capsule (250 mcg total) by  mouth 2 (two) times daily. 08/16/20   Fenton, Clint R, PA  ELIQUIS 5 MG TABS tablet TAKE 1 TABLET BY MOUTH EVERY 12 HOURS 08/10/20   Fenton, Clint R, PA  furosemide (LASIX) 40 MG tablet Take 1 tablet by mouth once daily 12/21/20   Sherren Mocha, MD  hydrALAZINE (APRESOLINE) 50 MG tablet Take 1 tablet (50 mg total) by mouth 3 (three) times daily. 11/23/20   Swords, Darrick Penna, MD  metoprolol tartrate (LOPRESSOR) 50 MG tablet Take 2 tablets by mouth twice daily 12/21/20   Fenton, Clint R, PA  mometasone (ELOCON) 0.1 % ointment Apply topically as needed (for skin rash).    [provider]  nitroGLYCERIN (NITROSTAT) 0.4 MG SL tablet Place 1 tablet (0.4 mg total) under the tongue every 5 (five) minutes as needed for chest pain. 07/24/17   Alycia Rossetti, MD  Omega-3 1000 MG CAPS Take 1,000 mg by mouth daily.     [provider]  VITAMIN D PO Take 1 tablet by mouth daily.    [provider]    Family History Family History  Problem Relation Age of Onset  . Arthritis Mother   . Cancer Mother        colon cancer  . Diabetes Mother   . Hypertension Mother   . Heart murmur Mother   . Early death Brother   . Heart murmur Brother   . Lung cancer Brother   . Arthritis Maternal Grandmother   . Heart disease Maternal Grandmother        massive heart attack  . Heart disease Paternal Grandmother   . Asthma Brother   . Diabetes Brother   . Hypertension Brother   . Heart murmur Brother   . Heart disease Father        anuersym- ruptered    Social History Social History   Tobacco Use  . Smoking status: Former Smoker    Quit date: 12/05/2011    Years since quitting: 9.1  . Smokeless tobacco: Never Used  Vaping Use  . Vaping Use: Never used  Substance Use Topics  . Alcohol use: Yes    Alcohol/week: 1.0 standard drink    Types: 1 Glasses of wine per week    Comment: occasionally  . Drug use: No     Allergies   Patient has no known allergies.   Review of Systems Review of Systems   Physical Exam Triage Vital Signs ED Triage Vitals  Enc Vitals Group     BP 02/10/21 1258 110/63     Pulse Rate 02/10/21 1258 71     Resp 02/10/21 1258 18     Temp 02/10/21 1258 98.2 F (36.8 C)     Temp Source 02/10/21 1258 Oral     SpO2 02/10/21 1258 97 %     Weight --      Height --      Head Circumference --      Peak Flow --      Pain Score 02/10/21 1300 0     Pain Loc --      Pain Edu? --      Excl. in [place]? --    No data found.  Updated Vital Signs BP 110/63 (BP Location: Left Arm)   Pulse 71   Temp 98.2 F (36.8 C) (Oral)    Resp 18   SpO2 97%   Visual Acuity Right Eye Distance:   Left Eye Distance:   Bilateral Distance:  Right Eye Near:   Left Eye Near:    Bilateral Near:     Physical Exam Vitals and nursing note reviewed.  Constitutional:      General: She is not in acute distress.    Appearance: Normal appearance. She is not ill-appearing, toxic-appearing or diaphoretic.  HENT:     Head: Normocephalic.     Nose: Nose normal.     Mouth/Throat:     Pharynx: Oropharynx is clear.  Eyes:     Conjunctiva/sclera: Conjunctivae normal.     Comments: Right periorbital swelling. Non tender to palpation of the periorbital area.  No pain with EOM.  Chemosis.  Left eye normal   Pulmonary:     Effort: Pulmonary effort is normal.  Musculoskeletal:        General: Normal range of motion.     Cervical back: Normal range of motion.  Skin:    General: Skin is warm and dry.     Findings: No rash.  Neurological:     Mental Status: She is alert.  Psychiatric:        Mood and Affect: Mood normal.      UC Treatments / Results  Labs (all labs ordered are listed, but only abnormal results are displayed) Labs Reviewed - No data to display  EKG   Radiology No results found.  Procedures Procedures (including critical care time)  Medications Ordered in UC Medications - No data to display  Initial Impression / Assessment and Plan / UC Course  I have reviewed the triage vital signs and the nursing notes.  Pertinent labs & imaging results that were available during my care of the patient were reviewed by me and considered in my medical decision making (see chart for details).     Periorbital swelling Most likely allergy but some underlying concern for early infection.  Will cover with abx at this time.  Continue the Claritin daily and start pataday eye drops.  Cool compresses to the eye.  Strict return precautions given.  Final Clinical Impressions(s) / UC Diagnoses   Final diagnoses:   Periorbital swelling  Chemosis of right conjunctiva     Discharge Instructions     Use the eyedrops daily.  Continue the Claritin.  Take the antibiotics as prescribed Cool compresses to the eye Follow up as needed for continued or worsening symptoms     ED Prescriptions    Medication Sig Dispense Auth. Provider   amoxicillin-clavulanate (AUGMENTIN) 875-125 MG tablet Take 1 tablet by mouth every 12 (twelve) hours. 14 tablet Inayah Woodin A, NP   Olopatadine HCl 0.2 % SOLN Apply 1 drop to eye once for 1 dose. 2.5 mL Loura Halt A, NP     PDMP not reviewed this encounter.   Orvan July, NP 02/11/21 [phone]

## 2021-02-10 NOTE — ED Triage Notes (Signed)
Pt presents today with c/o of right eye swelling since yesterday, +drainage, denies injury

## 2021-02-21 ENCOUNTER — Ambulatory Visit: Payer: Medicare PPO | Admitting: Cardiovascular Disease

## 2021-02-21 ENCOUNTER — Encounter: Payer: Self-pay | Admitting: Cardiovascular Disease

## 2021-02-21 ENCOUNTER — Other Ambulatory Visit: Payer: Self-pay

## 2021-02-21 VITALS — BP 120/70 | HR 67 | Ht 66.5 in | Wt 186.2 lb

## 2021-02-21 DIAGNOSIS — I5022 Chronic systolic (congestive) heart failure: Secondary | ICD-10-CM

## 2021-02-21 DIAGNOSIS — I4819 Other persistent atrial fibrillation: Secondary | ICD-10-CM | POA: Diagnosis not present

## 2021-02-21 DIAGNOSIS — I10 Essential (primary) hypertension: Secondary | ICD-10-CM

## 2021-02-21 DIAGNOSIS — N1831 Chronic kidney disease, stage 3a: Secondary | ICD-10-CM

## 2021-02-21 MED ORDER — DOFETILIDE 250 MCG PO CAPS: 250.0000 ug | ORAL_CAPSULE | Freq: Two times a day (BID) | ORAL | 11 refills | Status: DC

## 2021-02-21 NOTE — Progress Notes (Signed)
Cardiology Office Note:    Date:  02/21/2021   ID:  Tracy James, Tracy James 1951/03/12, MRN [id number]  PCP:  Patient, No Pcp Per   [facility]  Cardiologist:  Sherren Mocha, MD  Advanced Practice Provider:  No care team member to display Electrophysiologist:  None       Referring MD: Antony Blackbird, MD   Chief Complaint  Patient presents with  . Atrial Fibrillation  . Aortic Stenosis    History of Present Illness:    Tracy James is a 70 y.o. female with a hx of persistent atrial fibrillation, tachycardia mediated cardiomyopathy, aortic stenosis, hypertension, and tobacco abuse, presenting for follow-up evaluation.  The patient was initially found to have atrial fibrillation with RVR and December 2019.  She was unable to undergo cardioversion because TEE demonstrated thrombus in the left atrium.  She continued on anticoagulation and was loaded with dofetilide in August 2020.  She has done well with a strategy of rhythm control, with improvement in her exercise tolerance and cardiac-related symptoms.  LVEF on follow-up echo is 55 to 60%.  The patient is here today for follow-up evaluation.  She has been followed closely in the atrial fibrillation clinic over the past few years. CV problems outlined below:   Persistent AFib ? admx 11/2018 with AF with RVR; EF 40-45; TEE w/ + R and L atria clot (?clot adjacent to heart) ? TEE in 01/2019 with continued LA clot ? DCCV delayed due to COVID-19 ? Dofetilide Rx started 07/2019 ? CHA2DS2-VASc=6 (female, HTN, age x 1, CHF, LAA clot) >> Apixaban    HFrEF w/ return of normal LVF ? 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 TEE 12/2018: EF 30-35 ? Echocardiogram 06/2019: EF 55-60  Coronary CTA in August 2018 demonstrated a calcium score of 0 and no evidence of CAD  Aortic stenosis ? Mod AS; Echocardiogram 06/2019: mean 28 mmHg   Chronic kidney disease   Hypertension  Former smoker   Aortic  atherosclerosis   The patient is here alone today.  She is doing well.  She is to do water aerobics but has not doing this at present because of concerns about the COVID-19 pandemic.  She does not have any exertional symptoms.  She feels well at present. Today, she denies symptoms of palpitations, chest pain, shortness of breath, orthopnea, PND, lower extremity edema, dizziness, or syncope.  Past Medical History:  Diagnosis Date  . Allergy    SEASONAL  . Aortic stenosis    Echo 06/2019: mod AS, mean 28 mmHg, peak velocity 3.6 m/s // Echo 9/21: EF 60-65 no RWMA, moderate LVH, normal RVSF, trivial MR, trivial AI, moderate aortic stenosis (mean gradient 36 mmHg; V-max 3.64 m/s; DI 0.18) [reviewed with Dr. Burt Knack - mod AS; rpt echo 1 yr]    . CKD (chronic kidney disease)   . Hypertension   . Nonischemic cardiomyopathy    Tachy induced CM // TEE 12/2018: EF 30-35 // Echo 06/2019: EF 55-60 // Cor CTA 8/18: Ca score 0; no CAD  . Persistent atrial fibrillation    Admx 12/19 w AF w RVR // R+L atrial thrombus on TEE - DCCV canceled; repeat TEE in 01/2019 w some residual clot // Dofetilide Rx started 07/2019 >> NSR restored without DCCV // CHADS-VASc 4 (6 if include LAA clot) >> Apixaban     Past Surgical History:  Procedure Laterality Date  . TEE WITHOUT CARDIOVERSION N/A 12/06/2018   Procedure: TRANSESOPHAGEAL ECHOCARDIOGRAM (TEE);  Surgeon: Lelon Perla, MD;  Location: [facility] ENDOSCOPY;  Service: Cardiovascular;  Laterality: N/A;  . TEE WITHOUT CARDIOVERSION N/A 01/20/2019   Procedure: TRANSESOPHAGEAL ECHOCARDIOGRAM (TEE);  Surgeon: [name], MD;  Location: [facility] ENDOSCOPY;  Service: Cardiovascular;  Laterality: N/A;  . TONSILLECTOMY      Current Medications: Current Meds  Medication Sig  . acetaminophen (TYLENOL) 500 MG tablet Take 500 mg by mouth every 6 (six) hours as needed (for pain.).  Marland Kitchen calcium-vitamin D (OSCAL WITH D) 500-200 MG-UNIT TABS tablet Take 1 tablet by mouth daily.   Marland Kitchen  diltiazem (CARDIZEM CD) 300 MG 24 hr capsule Take 1 capsule (300 mg total) by mouth daily.  Marland Kitchen ELIQUIS 5 MG TABS tablet TAKE 1 TABLET BY MOUTH EVERY 12 HOURS  . furosemide (LASIX) 40 MG tablet Take 1 tablet by mouth once daily  . hydrALAZINE (APRESOLINE) 50 MG tablet Take 1 tablet (50 mg total) by mouth 3 (three) times daily.  Marland Kitchen loratadine (CLARITIN) 10 MG tablet Take 10 mg by mouth daily as needed for allergies.  . metoprolol tartrate (LOPRESSOR) 50 MG tablet Take 2 tablets by mouth twice daily  . mometasone (ELOCON) 0.1 % ointment Apply topically as needed (for skin rash).  . nitroGLYCERIN (NITROSTAT) 0.4 MG SL tablet Place 1 tablet (0.4 mg total) under the tongue every 5 (five) minutes as needed for chest pain.  Marland Kitchen Olopatadine HCl 0.2 % SOLN Apply to eye. One drop to affected eye once daily  . Omega-3 1000 MG CAPS Take 1,000 mg by mouth daily.   Marland Kitchen VITAMIN D PO Take 1 tablet by mouth daily.  . [DISCONTINUED] dofetilide (TIKOSYN) 250 MCG capsule Take 1 capsule (250 mcg total) by mouth 2 (two) times daily.     Allergies:   Patient has no known allergies.   Social History   Socioeconomic History  . Marital status: Divorced    Spouse name: Not on file  . Number of children: Not on file  . Years of education: Not on file  . Highest education level: Not on file  Occupational History  . Not on file  Tobacco Use  . Smoking status: Former Smoker    Quit date: 12/05/2011    Years since quitting: 9.2  . Smokeless tobacco: Never Used  Vaping Use  . Vaping Use: Never used  Substance and Sexual Activity  . Alcohol use: Yes    Alcohol/week: 1.0 standard drink    Types: 1 Glasses of wine per week    Comment: occasionally  . Drug use: No  . Sexual activity: Not Currently    Birth control/protection: None  Other Topics Concern  . Not on file  Social History Narrative  . Not on file   Social Determinants of Health   Financial Resource Strain: Not on file  Food Insecurity: Not on file   Transportation Needs: Not on file  Physical Activity: Not on file  Stress: Not on file  Social Connections: Not on file     Family History: The patient's family history includes Arthritis in her maternal grandmother and mother; Asthma in her brother; Cancer in her mother; Diabetes in her brother and mother; Early death in her brother; Heart disease in her father, maternal grandmother, and paternal grandmother; Heart murmur in her brother, brother, and mother; Hypertension in her brother and mother; Lung cancer in her brother.  ROS:   Please see the history of present illness.    All other systems reviewed and are negative.  EKGs/Labs/Other  Studies Reviewed:    The following studies were reviewed today: Echo 08/20/2020: IMPRESSIONS    1. Left ventricular ejection fraction, by estimation, is 60 to 65%. The  left ventricle has normal function. The left ventricle has no regional  wall motion abnormalities. There is moderate left ventricular hypertrophy  of the basal-septal segment. Left  ventricular diastolic parameters are indeterminate. Elevated left  ventricular end-diastolic pressure.  2. Right ventricular systolic function is normal. The right ventricular  size is normal.  3. The mitral valve is normal in structure. Trivial mitral valve  regurgitation. No evidence of mitral stenosis.  4. The aortic valve has an indeterminant number of cusps. There is  moderate calcification of the aortic valve. There is moderate thickening  of the aortic valve. Aortic valve regurgitation is trivial. Moderate  aortic valve stenosis. Aortic valve area, by  VTI measures 0.61 cm. Aortic valve mean gradient measures 36.0 mmHg.  Aortic valve Vmax measures 3.64 m/s.  5. The inferior vena cava is normal in size with greater than 50%  respiratory variability, suggesting right atrial pressure of 3 mmHg.  6. The AV is poorly visualized. The peak AV velocity and mean AVG are  consistent with  moderate AS but the DI is low at 0.18 and AVA is  calculated at 0.61cm2 with normal LVOT measurement. Consider TEE for  further assessment of AV.  7. Compared to prior echo, peak AV velocity increased from 3.80m/s to  3.50m/s and mean AVG has increased from 23.8 to 35mmHg.   FINDINGS  Left Ventricle: Left ventricular ejection fraction, by estimation, is 60  to 65%. The left ventricle has normal function. The left ventricle has no  regional wall motion abnormalities. Global longitudinal strain performed  but not reported based on  interpreter judgement due to suboptimal tracking. The left ventricular  internal cavity size was normal in size. There is moderate left  ventricular hypertrophy of the basal-septal segment. Left ventricular  diastolic parameters are indeterminate. Elevated  left ventricular end-diastolic pressure.   Right Ventricle: The right ventricular size is normal. No increase in  right ventricular wall thickness. Right ventricular systolic function is  normal.   Left Atrium: Left atrial size was normal in size.   Right Atrium: Right atrial size was normal in size.   Pericardium: There is no evidence of pericardial effusion.   Mitral Valve: The mitral valve is normal in structure. Trivial mitral  valve regurgitation. No evidence of mitral valve stenosis.   Tricuspid Valve: The tricuspid valve is normal in structure. Tricuspid  valve regurgitation is not demonstrated. No evidence of tricuspid  stenosis.   Aortic Valve: The aortic valve has an indeterminant number of cusps. There  is moderate calcification of the aortic valve. There is moderate  thickening of the aortic valve. Aortic valve regurgitation is trivial.  Moderate aortic stenosis is present. Aortic  valve mean gradient measures 36.0 mmHg. Aortic valve peak gradient  measures 53.0 mmHg. Aortic valve area, by VTI measures 0.61 cm.   Pulmonic Valve: The pulmonic valve was normal in structure. Pulmonic  valve  regurgitation is trivial. No evidence of pulmonic stenosis.   Aorta: The aortic root is normal in size and structure.   Venous: The inferior vena cava is normal in size with greater than 50%  respiratory variability, suggesting right atrial pressure of 3 mmHg.   IAS/Shunts: No atrial level shunt detected by color flow Doppler.     LEFT VENTRICLE  PLAX 2D  LVIDd:  4.20 cm Diastology  LVIDs:     2.10 cm LV e' medial:  63.96 cm/s  LV PW:     1.10 cm LV E/e' medial: 2.0  LV IVS:    1.40 cm LV e' lateral:  5.11 cm/s  LVOT diam:   2.10 cm LV E/e' lateral: 25.0  LV SV:     68  LV SV Index:  35    2D Longitudinal Strain  LVOT Area:   3.46 cm 2D Strain GLS (A2C):  -16.2 %             2D Strain GLS (A3C):  -16.6 %             2D Strain GLS (A4C):  -11.7 %             2D Strain GLS Avg:   -14.9 %   RIGHT VENTRICLE  RV Basal diam: 3.20 cm  RV Mid diam:  2.90 cm  RV S prime:   6.96 cm/s  TAPSE (M-mode): 1.7 cm   LEFT ATRIUM       Index  LA diam:    3.70 cm 1.91 cm/m  LA Vol (A2C):  74.2 ml 38.35 ml/m  LA Vol (A4C):  55.1 ml 28.48 ml/m  LA Biplane Vol: 65.3 ml 33.75 ml/m  AORTIC VALVE  AV Area (Vmax):  0.66 cm  AV Area (Vmean):  0.67 cm  AV Area (VTI):   0.61 cm  AV Vmax:      364.00 cm/s  AV Vmean:     264.400 cm/s  AV VTI:      1.110 m  AV Peak Grad:   53.0 mmHg  AV Mean Grad:   36.0 mmHg  LVOT Vmax:     69.20 cm/s  LVOT Vmean:    51.300 cm/s  LVOT VTI:     0.195 m  LVOT/AV VTI ratio: 0.18    AORTA  Ao Root diam: 2.80 cm  Ao Asc diam: 2.80 cm   MITRAL VALVE  MV Area (PHT):       SHUNTS  MV Decel Time: 328 msec   Systemic VTI: 0.20 m  MV E velocity: 128.00 cm/s Systemic Diam: 2.10 cm  MV A velocity: 80.73 cm/s  MV E/A ratio: 1.59   Recent Labs: 01/14/2021: BUN 22; Creatinine, Ser 1.48; Magnesium 2.1;  Potassium 4.5; Sodium 138  Recent Lipid Panel    Component Value Date/Time   CHOL 197 11/07/2019 1058   TRIG 128 11/07/2019 1058   HDL 48 11/07/2019 1058   CHOLHDL 4.1 11/07/2019 1058   CHOLHDL 3.2 09/26/2017 0910   VLDL 52 (H) 09/22/2016 0829   LDLCALC 126 (H) 11/07/2019 1058   [place] 95 09/26/2017 0910     Risk Assessment/Calculations:    CHA2DS2-VASc Score = 4  This indicates a 4.8% annual risk of stroke. The patient's score is based upon: CHF History: Yes HTN History: Yes Diabetes History: No Stroke History: No Vascular Disease History: No Age Score: 1 Gender Score: 1      Physical Exam:    VS:  BP 120/70   Pulse 67   Ht 5' 6.5" (1.689 m)   Wt 186 lb 3.2 oz (84.5 kg)   SpO2 97%   BMI 29.60 kg/m     Wt Readings from Last 3 Encounters:  02/21/21 186 lb 3.2 oz (84.5 kg)  01/14/21 186 lb 6.4 oz (84.6 kg)  11/23/20 189 lb (85.7 kg)     GEN: Well nourished, well developed  in no acute distress HEENT: Normal NECK: No JVD; No carotid bruits LYMPHATICS: No lymphadenopathy CARDIAC: RRR, 2/6 harsh late peaking systolic murmur at the RUSB, diminished A2 RESPIRATORY:  Clear to auscultation without rales, wheezing or rhonchi  ABDOMEN: Soft, non-tender, non-distended MUSCULOSKELETAL:  No edema; No deformity  SKIN: Warm and dry NEUROLOGIC:  Alert and oriented x 3 PSYCHIATRIC:  Normal affect   ASSESSMENT:    1. Persistent atrial fibrillation (HCC)   2. Stage 3a chronic kidney disease ([place])   3. Chronic systolic CHF (congestive heart failure) ([place])   4. Essential hypertension    PLAN:    In order of problems listed above:  1. Maintaining sinus rhythm on dofetilide.  Anticoagulated with apixaban, with no bleeding problems.  Continue current management.  Nice improvement in LV function with sinus rhythm. 2. Most recent labs reviewed.  Creatinine 1.48 which is stable, GFR estimated at 39. 3. New York Heart Association functional class I symptoms at present.   LVEF has normalized with sinus rhythm.  Continue current management. 4. Blood pressure well controlled on diltiazem, hydralazine, and metoprolol. 5. I reviewed her echo study from September 2021.  The aortic valve is moderately calcified and thickened.  Aortic valve opening is moderately reduced.  Doppler data show peak and mean gradients of 52 and 35 mmHg, respectively.  The aortic valve dimensionless index is 0.19.  Aortic valve area is calculated at 0.6 to 0.7 cm.  There is some discordance between the way the valve looks, clearly opening with only moderate restriction, and the Doppler criteria which is more suggestive of severe aortic stenosis.  We will continue to follow with serial echo studies.  The patient is asymptomatic.  I counseled her regarding possible symptoms of aortic stenosis including fatigue, chest discomfort, shortness of breath, or lightheadedness.  She will keep an eye out for symptoms and we will repeat an echocardiogram in September when she follows up with Tracy James.  Medication Adjustments/Labs and Tests Ordered: Current medicines are reviewed at length with the patient today.  Concerns regarding medicines are outlined above.  Orders Placed This Encounter  Procedures  . ECHOCARDIOGRAM COMPLETE   Meds ordered this encounter  Medications  . dofetilide (TIKOSYN) 250 MCG capsule    Sig: Take 1 capsule (250 mcg total) by mouth 2 (two) times daily.    Dispense:  60 capsule    Refill:  11    Patient Instructions  Medication Instructions:  Your provider recommends that you continue on your current medications as directed. Please refer to the Current Medication list given to you today.   *If you need a refill on your cardiac medications before your next appointment, please call your pharmacy*  Testing/Procedures: Your provider has requested that you have an echocardiogram in 6 months. Echocardiography is a painless test that uses sound waves to create images of your  heart. It provides your doctor with information about the size and shape of your heart and how well your heart's chambers and valves are working. This procedure takes approximately one hour. There are no restrictions for this procedure.  Follow-Up: At [facility], you and your health needs are our priority.  As part of our continuing mission to provide you with exceptional heart care, we have created designated Provider Care Teams.  These Care Teams include your primary Cardiologist (physician) and Advanced Practice Providers (APPs -  Physician Assistants and Nurse Practitioners) who all work together to provide you with the care you need, when you need  it. Your next appointment:   6 month(s) The format for your next appointment:   In Person Provider:    Richardson Dopp, PA      Signed, Sherren Mocha, MD  02/21/2021 1:28 PM    [facility]

## 2021-02-21 NOTE — Patient Instructions (Signed)
Medication Instructions:  Your provider recommends that you continue on your current medications as directed. Please refer to the Current Medication list given to you today.   *If you need a refill on your cardiac medications before your next appointment, please call your pharmacy*  Testing/Procedures: Your provider has requested that you have an echocardiogram in 6 months. Echocardiography is a painless test that uses sound waves to create images of your heart. It provides your doctor with information about the size and shape of your heart and how well your heart's chambers and valves are working. This procedure takes approximately one hour. There are no restrictions for this procedure.  Follow-Up: At [facility], you and your health needs are our priority.  As part of our continuing mission to provide you with exceptional heart care, we have created designated Provider Care Teams.  These Care Teams include your primary Cardiologist (physician) and Advanced Practice Providers (APPs -  Physician Assistants and Nurse Practitioners) who all work together to provide you with the care you need, when you need it. Your next appointment:   6 month(s) The format for your next appointment:   In Person Provider:    Richardson Dopp, PA

## 2021-03-24 ENCOUNTER — Encounter: Payer: Self-pay | Admitting: Family Medicine

## 2021-03-24 ENCOUNTER — Other Ambulatory Visit: Payer: Self-pay

## 2021-03-24 ENCOUNTER — Ambulatory Visit: Payer: Medicare PPO | Attending: Family Medicine | Admitting: Family Medicine

## 2021-03-24 VITALS — BP 136/76 | HR 61 | Resp 17 | Ht 66.0 in | Wt 186.8 lb

## 2021-03-24 DIAGNOSIS — I5022 Chronic systolic (congestive) heart failure: Secondary | ICD-10-CM | POA: Diagnosis not present

## 2021-03-24 DIAGNOSIS — N1831 Chronic kidney disease, stage 3a: Secondary | ICD-10-CM

## 2021-03-24 DIAGNOSIS — I4891 Unspecified atrial fibrillation: Secondary | ICD-10-CM | POA: Diagnosis not present

## 2021-03-24 DIAGNOSIS — H1013 Acute atopic conjunctivitis, bilateral: Secondary | ICD-10-CM

## 2021-03-24 MED ORDER — OLOPATADINE HCL 0.2 % OP SOLN: 1.0000 [drp] | Freq: Every day | OPHTHALMIC | 2 refills | Status: DC

## 2021-03-24 MED ORDER — ATORVASTATIN CALCIUM 20 MG PO TABS: 20.0000 mg | ORAL_TABLET | Freq: Every day | ORAL | 6 refills | Status: DC

## 2021-03-24 NOTE — Patient Instructions (Signed)
Allergies, Adult An allergy means that your body reacts to something that bothers it (allergen). This can happen from something that you eat, breathe in, or touch. Allergies often affect the nose, eyes, skin, and stomach. They can be mild, moderate, or very bad (severe). An allergy cannot spread from person to person. They can happen at any age. Sometimes, people outgrow them. What are the causes?  Outdoor things, such as pollen, car fumes, and mold.  Indoor things, such as dust, smoke, mold, and pets.  Foods.  Medicines.  Things that bother your skin, such as perfume and bug bites. What increases the risk?  Having family members with allergies or asthma. What are the signs or symptoms? Symptoms depend on how bad your allergy is. Mild to moderate symptoms  Runny nose, stuffy nose, or sneezing.  Itchy mouth, ears, or throat.  A feeling of mucus dripping down the back of your throat.  Sore throat.  Eyes that are itchy, red, watery, or puffy.  A skin rash, or red, swollen areas of skin (hives).  Stomach cramps or bloating. Severe symptoms Very bad allergies to food, medicine, or bug bites may cause a very bad allergy reaction (anaphylaxis). This can be life-threatening. Symptoms include:  A red face.  Wheezing or coughing.  Swollen lips, tongue, or mouth.  Tight or swollen throat.  Chest pain or tightness, or a fast heartbeat.  Trouble breathing or shortness of breath.  Pain in your belly (abdomen), vomiting, or watery poop (diarrhea).  Feeling dizzy or fainting. How is this treated? Treatment for this condition depends on your symptoms. Treatment may include:  Cold, wet cloths for itching and swelling.  Eye drops, nose sprays, or skin creams.  Washing out your nose each day.  A humidifier.  Medicines.  A change to the foods you eat.  Being exposed again and again to tiny amounts of allergens. This helps your body get used to them. You might  have: ? Allergy shots. ? Very small amounts of allergen put under your tongue.  An emergency shot (auto-injector pen) if you have a very bad allergy reaction. ? This is a medicine with a needle. You can put it into your skin by yourself. ? Your doctor will teach you how to use it.      Follow these instructions at home: Medicines  Take or apply over-the-counter and prescription medicines only as told by your doctor.  If you are at risk for a very bad allergy reaction, keep an auto-injector pen with you all the time.   Eating and drinking  Follow instructions from your doctor about what to eat and drink.  Drink enough fluid to keep your pee (urine) pale yellow. General instructions  If you have ever had a very bad allergy reaction, wear a medical alert bracelet or necklace.  Stay away from things that you are allergic to.  Keep all follow-up visits as told by your doctor. This is important. Contact a doctor if:  Your symptoms do not get better with treatment. Get help right away if:  You have symptoms of a very bad allergy reaction. These include: ? A swollen mouth, tongue, or throat. ? Pain or tightness in your chest. ? Trouble breathing. ? Being short of breath. ? Dizziness. ? Fainting. ? Very bad pain in your belly. ? Vomiting. ? Watery poop. These symptoms may be an emergency. Do not wait to see if the symptoms will go away. Get medical help right away. Call your local  emergency services (911 in the [place] Do not drive yourself to the hospital. Summary  Take or apply over-the-counter and prescription medicines only as told by your doctor.  Stay away from things you are allergic to.  If you are at risk for a very bad allergy reaction, carry an auto-injector pen all the time.  Wear a medical alert bracelet or necklace.  Very bad allergy reactions can be life-threatening. Get help right away. This information is not intended to replace advice given to you by your  health care provider. Make sure you discuss any questions you have with your health care provider. Document Revised: 10/01/2019 Document Reviewed: 10/01/2019 Elsevier Patient Education  2021 [employer].

## 2021-03-24 NOTE — Progress Notes (Signed)
Subjective:  Patient ID: Tracy James, female    DOB: 06/04/1951  Age: 70 y.o. MRN: [id number]  CC: Establish Care   HPI Tracy James is a 70 year old female with history of hypertension, atrial fibrillation CHF (EF 60 to 65%, LVH from 08/2020) previously followed by Dr. Chapman Fitch. Next visit with cardiology is in 07/2021 and she endorses compliance with her medications. She has no chest pain, palpitation, dizziness, pedal edema, weight gain, orthopnea, radius exercise tolerance.  Her eyes have been runny and she had her R eye closed from the pollen to the extent that she had to present to urgent care.  She has rhinorrhea as well. She has been taking Loratadine and was seen by UC in [place] where she was prescribed an antibiotic and eye drops both of which she has completed. She denies presence of sinus pressure, fever, myalgias. Past Medical History:  Diagnosis Date  . Allergy    SEASONAL  . Aortic stenosis    Echo 06/2019: mod AS, mean 28 mmHg, peak velocity 3.6 m/s // Echo 9/21: EF 60-65 no RWMA, moderate LVH, normal RVSF, trivial MR, trivial AI, moderate aortic stenosis (mean gradient 36 mmHg; V-max 3.64 m/s; DI 0.18) [reviewed with Dr. Burt Knack - mod AS; rpt echo 1 yr]    . CKD (chronic kidney disease)   . Hypertension   . Nonischemic cardiomyopathy    Tachy induced CM // TEE 12/2018: EF 30-35 // Echo 06/2019: EF 55-60 // Cor CTA 8/18: Ca score 0; no CAD  . Persistent atrial fibrillation    Admx 12/19 w AF w RVR // R+L atrial thrombus on TEE - DCCV canceled; repeat TEE in 01/2019 w some residual clot // Dofetilide Rx started 07/2019 >> NSR restored without DCCV // CHADS-VASc 4 (6 if include LAA clot) >> Apixaban     Past Surgical History:  Procedure Laterality Date  . TEE WITHOUT CARDIOVERSION N/A 12/06/2018   Procedure: TRANSESOPHAGEAL ECHOCARDIOGRAM (TEE);  Surgeon: Lelon Perla, MD;  Location: [facility] ENDOSCOPY;  Service: Cardiovascular;  Laterality: N/A;  . TEE WITHOUT  CARDIOVERSION N/A 01/20/2019   Procedure: TRANSESOPHAGEAL ECHOCARDIOGRAM (TEE);  Surgeon: [name], MD;  Location: [facility] ENDOSCOPY;  Service: Cardiovascular;  Laterality: N/A;  . TONSILLECTOMY      Family History  Problem Relation Age of Onset  . Arthritis Mother   . Cancer Mother        colon cancer  . Diabetes Mother   . Hypertension Mother   . Heart murmur Mother   . Early death Brother   . Heart murmur Brother   . Lung cancer Brother   . Arthritis Maternal Grandmother   . Heart disease Maternal Grandmother        massive heart attack  . Heart disease Paternal Grandmother   . Asthma Brother   . Diabetes Brother   . Hypertension Brother   . Heart murmur Brother   . Heart disease Father        anuersym- ruptered    No Known Allergies  Outpatient Medications Prior to Visit  Medication Sig Dispense Refill  . acetaminophen (TYLENOL) 500 MG tablet Take 500 mg by mouth every 6 (six) hours as needed (for pain.).    Marland Kitchen calcium-vitamin D (OSCAL WITH D) 500-200 MG-UNIT TABS tablet Take 1 tablet by mouth daily.     Marland Kitchen diltiazem (CARDIZEM CD) 300 MG 24 hr capsule Take 1 capsule (300 mg total) by mouth daily. 90 capsule 1  . dofetilide (TIKOSYN) 250  MCG capsule Take 1 capsule (250 mcg total) by mouth 2 (two) times daily. 60 capsule 11  . ELIQUIS 5 MG TABS tablet TAKE 1 TABLET BY MOUTH EVERY 12 HOURS 60 tablet 11  . furosemide (LASIX) 40 MG tablet Take 1 tablet by mouth once daily 90 tablet 2  . hydrALAZINE (APRESOLINE) 50 MG tablet Take 1 tablet (50 mg total) by mouth 3 (three) times daily. 270 tablet 3  . loratadine (CLARITIN) 10 MG tablet Take 10 mg by mouth daily as needed for allergies.    . metoprolol tartrate (LOPRESSOR) 50 MG tablet Take 2 tablets by mouth twice daily 360 tablet 0  . mometasone (ELOCON) 0.1 % ointment Apply topically as needed (for skin rash).    . nitroGLYCERIN (NITROSTAT) 0.4 MG SL tablet Place 1 tablet (0.4 mg total) under the tongue every 5 (five)  minutes as needed for chest pain. 20 tablet 0  . Omega-3 1000 MG CAPS Take 1,000 mg by mouth daily.     Marland Kitchen VITAMIN D PO Take 1 tablet by mouth daily.    . Olopatadine HCl 0.2 % SOLN Apply to eye. One drop to affected eye once daily (Patient not taking: Reported on 03/24/2021)     No facility-administered medications prior to visit.     ROS Review of Systems  Constitutional: Negative for activity change, appetite change and fatigue.  HENT: Positive for rhinorrhea. Negative for congestion, sinus pressure and sore throat.   Eyes: Positive for discharge. Negative for visual disturbance.  Respiratory: Negative for cough, chest tightness, shortness of breath and wheezing.   Cardiovascular: Negative for chest pain and palpitations.  Gastrointestinal: Negative for abdominal distention, abdominal pain and constipation.  Endocrine: Negative for polydipsia.  Genitourinary: Negative for dysuria and frequency.  Musculoskeletal: Negative for arthralgias and back pain.  Skin: Negative for rash.  Neurological: Negative for tremors, light-headedness and numbness.  Hematological: Does not bruise/bleed easily.  Psychiatric/Behavioral: Negative for agitation and behavioral problems.    Objective:  BP 136/76   Pulse 61   Resp 17   Ht 5\' 6"  (1.676 m)   Wt 186 lb 12.8 oz (84.7 kg)   SpO2 95%   BMI 30.15 kg/m   BP/Weight 03/24/2021 02/21/2021 4/65/0354  Systolic BP 656 812 751  Diastolic BP 76 70 63  Wt. (Lbs) 186.8 186.2 -  BMI 30.15 29.6 -      Physical Exam Constitutional:      Appearance: She is well-developed.  Neck:     Vascular: No JVD.  Cardiovascular:     Rate and Rhythm: Normal rate.     Heart sounds: Normal heart sounds. No murmur heard.   Pulmonary:     Effort: Pulmonary effort is normal.     Breath sounds: Normal breath sounds. No wheezing or rales.  Chest:     Chest wall: No tenderness.  Abdominal:     General: Bowel sounds are normal. There is no distension.      Palpations: Abdomen is soft. There is no mass.     Tenderness: There is no abdominal tenderness.  Musculoskeletal:        General: Normal range of motion.     Right lower leg: No edema.     Left lower leg: No edema.  Neurological:     Mental Status: She is alert and oriented to person, place, and time.  Psychiatric:        Mood and Affect: Mood normal.     CMP Latest Ref Rng &  Units 01/14/2021 07/09/2020 03/12/2020  Glucose 70 - 99 mg/dL 108(H) 90 104(H)  BUN 8 - 23 mg/dL 22 25(H) 18  Creatinine 0.44 - 1.00 mg/dL 1.48(H) 1.29(H) 1.46(H)  Sodium 135 - 145 mmol/L 138 138 140  Potassium 3.5 - 5.1 mmol/L 4.5 4.7 4.2  Chloride 98 - 111 mmol/L 103 106 103  CO2 22 - 32 mmol/L 26 24 26   Calcium 8.9 - 10.3 mg/dL 9.6 9.3 9.4  Total Protein 6.0 - 8.5 g/dL - - -  Total Bilirubin 0.0 - 1.2 mg/dL - - -  Alkaline Phos 39 - 117 IU/L - - -  AST 0 - 40 IU/L - - -  ALT 0 - 32 IU/L - - -    Lipid Panel     Component Value Date/Time   CHOL 197 11/07/2019 1058   TRIG 128 11/07/2019 1058   HDL 48 11/07/2019 1058   CHOLHDL 4.1 11/07/2019 1058   CHOLHDL 3.2 09/26/2017 0910   VLDL 52 (H) 09/22/2016 0829   LDLCALC 126 (H) 11/07/2019 1058   LDLCALC 95 09/26/2017 0910    CBC    Component Value Date/Time   WBC 8.4 11/07/2019 1058   WBC 8.7 12/07/2018 0333   RBC 4.42 11/07/2019 1058   RBC 4.12 12/07/2018 0333   HGB 13.1 11/07/2019 1058   HCT 38.9 11/07/2019 1058   PLT 262 11/07/2019 1058   MCV 88 11/07/2019 1058   MCH 29.6 11/07/2019 1058   MCH 30.6 12/07/2018 0333   MCHC 33.7 11/07/2019 1058   MCHC 32.0 12/07/2018 0333   RDW 13.2 11/07/2019 1058   LYMPHSABS 2.7 01/15/2019 1049   MONOABS 714 07/24/2017 1613   EOSABS 0.1 01/15/2019 1049   BASOSABS 0.1 01/15/2019 1049    Lab Results  Component Value Date   HGBA1C 6.0 (H) 11/07/2019    The 10-year ASCVD risk score Mikey Bussing DC Jr., et al., 2013) is: 11.6%   Values used to calculate the score:     Age: 62 years     Sex: Female     Is  Non-Hispanic African American: Yes     Diabetic: No     Tobacco smoker: No     Systolic Blood Pressure: 294 mmHg     Is BP treated: Yes     HDL Cholesterol: 48 mg/dL     Total Cholesterol: 197 mg/dL  Assessment & Plan:   1. Allergic conjunctivitis of both eyes Controlled We will place on a Olopatadine eyedrops Continue loratadine Frequent handwashing encouraged - Olopatadine HCl 0.2 % SOLN; Apply 1 drop to eye daily. One drop to affected eye once daily  Dispense: 2.5 mL; Refill: 2  2. Atrial fibrillation, unspecified type ([place]) Currently in sinus rhythm Continue metoprolol, Tikosyn  3. Chronic systolic CHF (congestive heart failure) (HCC) EF 60 to 65% Euvolemic We will initiate statin due to 10 yr ASCVD risk score of 11.6% - atorvastatin (LIPITOR) 20 MG tablet; Take 1 tablet (20 mg total) by mouth daily.  Dispense: 30 tablet; Refill: 6  4. Chronic kidney disease, stage 3a ([place]) Stable with last creatinine at 1.48 Avoid nephrotoxic agents    Meds ordered this encounter  Medications  . Olopatadine HCl 0.2 % SOLN    Sig: Apply 1 drop to eye daily. One drop to affected eye once daily    Dispense:  2.5 mL    Refill:  2  . atorvastatin (LIPITOR) 20 MG tablet    Sig: Take 1 tablet (20 mg total) by mouth  daily.    Dispense:  30 tablet    Refill:  6    Follow-up: Return in about 6 months (around 09/23/2021) for Chronic medical conditions.       Charlott Rakes, MD, FAAFP. [facility] and [place], [place]   03/25/2021, 11:19 AM

## 2021-03-25 ENCOUNTER — Telehealth: Payer: Self-pay | Admitting: Physician Assistant

## 2021-03-25 NOTE — Telephone Encounter (Signed)
Spoke with the patient who states that her PCP started her on atorvastatin 20 mg daily. She was told that all individuals with heart problems should be on a cholesterol medication. She states that she does not want to take the medication unless her cardiologist says that she needs it. Patient has not had her cholesterol checked since 11/2019. Patient states she is not going to start the medication.

## 2021-03-25 NOTE — Telephone Encounter (Signed)
Pt c/o medication issue:  1. Name of Medication:   atorvastatin (LIPITOR) 20 MG tablet     2. How are you currently taking this medication (dosage and times per day)?  Not yet started  3. Are you having a reaction (difficulty breathing--STAT)? NO  4. What is your medication issue? PT is calling with concerns about this medication.PT states her PCP prescribed this medication but she wont take it until her cardiologist confirms it is ok.Please advise

## 2021-03-27 NOTE — Telephone Encounter (Signed)
Chart reviewed. She has aortic stenosis. Statin Rx is indicated and it would be appropriate for her to start atorvastatin as recommended by her PCP. I agree with this recommendation. Thank you.

## 2021-03-29 ENCOUNTER — Encounter: Payer: Self-pay | Admitting: Cardiovascular Disease

## 2021-03-29 NOTE — Telephone Encounter (Signed)
Patient states she was returning a call from this morning, but dont see any notes regarding such. Pleas advise

## 2021-03-29 NOTE — Telephone Encounter (Signed)
This encounter was created in error - please disregard.

## 2021-03-29 NOTE — Telephone Encounter (Signed)
Reiterated to the patient Dr. Burt Knack agrees with PCP recommendation to start atorvastatin. She was grateful for call and will start today.

## 2021-07-13 ENCOUNTER — Other Ambulatory Visit (HOSPITAL_COMMUNITY): Payer: Self-pay | Admitting: Physician Assistant

## 2021-07-15 ENCOUNTER — Encounter (HOSPITAL_COMMUNITY): Payer: Self-pay | Admitting: Physician Assistant

## 2021-07-15 ENCOUNTER — Ambulatory Visit (HOSPITAL_COMMUNITY)
Admission: RE | Admit: 2021-07-15 | Discharge: 2021-07-15 | Disposition: A | Discharge status: Home / Self Care | Payer: Medicare PPO | Source: Ambulatory Visit | Attending: Physician Assistant | Admitting: Physician Assistant

## 2021-07-15 ENCOUNTER — Other Ambulatory Visit: Payer: Self-pay

## 2021-07-15 VITALS — BP 112/60 | HR 60 | Ht 66.0 in | Wt 180.6 lb

## 2021-07-15 DIAGNOSIS — I350 Nonrheumatic aortic (valve) stenosis: Secondary | ICD-10-CM | POA: Insufficient documentation

## 2021-07-15 DIAGNOSIS — I4819 Other persistent atrial fibrillation: Secondary | ICD-10-CM | POA: Diagnosis not present

## 2021-07-15 DIAGNOSIS — Z7901 Long term (current) use of anticoagulants: Secondary | ICD-10-CM | POA: Diagnosis not present

## 2021-07-15 DIAGNOSIS — I10 Essential (primary) hypertension: Secondary | ICD-10-CM | POA: Insufficient documentation

## 2021-07-15 DIAGNOSIS — Z8249 Family history of ischemic heart disease and other diseases of the circulatory system: Secondary | ICD-10-CM | POA: Insufficient documentation

## 2021-07-15 DIAGNOSIS — I429 Cardiomyopathy, unspecified: Secondary | ICD-10-CM | POA: Diagnosis not present

## 2021-07-15 DIAGNOSIS — Z87891 Personal history of nicotine dependence: Secondary | ICD-10-CM | POA: Diagnosis not present

## 2021-07-15 DIAGNOSIS — Z79899 Other long term (current) drug therapy: Secondary | ICD-10-CM | POA: Insufficient documentation

## 2021-07-15 DIAGNOSIS — Z09 Encounter for follow-up examination after completed treatment for conditions other than malignant neoplasm: Secondary | ICD-10-CM | POA: Insufficient documentation

## 2021-07-15 DIAGNOSIS — D6869 Other thrombophilia: Secondary | ICD-10-CM

## 2021-07-15 LAB — CBC
HCT: 40.2 % (ref 36.0–46.0)
Hemoglobin: 13.1 g/dL (ref 12.0–15.0)
MCH: 29 pg (ref 26.0–34.0)
MCHC: 32.6 g/dL (ref 30.0–36.0)
MCV: 89.1 fL (ref 80.0–100.0)
Platelets: 301 10*3/uL (ref 150–400)
RBC: 4.51 MIL/uL (ref 3.87–5.11)
RDW: 13.7 % (ref 11.5–15.5)
WBC: 8.9 10*3/uL (ref 4.0–10.5)
nRBC: 0 % (ref 0.0–0.2)

## 2021-07-15 LAB — BASIC METABOLIC PANEL
Anion gap: 7 (ref 5–15)
BUN: 16 mg/dL (ref 8–23)
CO2: 24 mmol/L (ref 22–32)
Calcium: 9.2 mg/dL (ref 8.9–10.3)
Chloride: 105 mmol/L (ref 98–111)
Creatinine, Ser: 1.3 mg/dL — ABNORMAL HIGH (ref 0.44–1.00)
GFR, Estimated: 45 mL/min — ABNORMAL LOW (ref >60)
Glucose, Bld: 96 mg/dL (ref 70–99)
Potassium: 4.1 mmol/L (ref 3.5–5.1)
Sodium: 136 mmol/L (ref 135–145)

## 2021-07-15 LAB — MAGNESIUM: Magnesium: 2 mg/dL (ref 1.7–2.4)

## 2021-07-15 NOTE — Progress Notes (Signed)
Primary Care Physician: Charlott Rakes, MD Primary Cardiologist: Dr Burt Knack Primary Electrophysiologist: Dr Rayann Heman Referring Physician: Richardson Dopp PA-C   Tracy James is a 70 y.o. female with a history of persistent atrial fibrillation, tachycardia induced cardiomyopathy, AS, HTN, and tobacco abuse who presents for follow up in the [facility]. She was admitted in 11/2018 with AF with RVR.  EF was 40-45 on Echo and TEE demonstrated thrombus in the R and L atria and possible thrombus adjacent to the heart.  After a month of anticoagulation, she underwent repeat TEE-DCCV 01/20/2019.  Unfortunately, the TEE continued to show residual clot in the LA.  Therefore, DCCV was not performed.  She was seen in follow up on 02/2019 and her HR was uncontrolled.  Unfortunately, repeat DCCV was not arranged due to COVID-19 restrictions (elective procedures on hold).  She was placed on Digoxin for rate control. She was admitted 07/15/19-07/18/19 for dofetilide loading. She is on Eliquis for a CHADS2VASC score of 4.   On follow up today, patient reports she continues to do well on dofetilide. She denies any heart racing or palpitations. She is changing her diet to try and include more fruits and vegetables. She denies any bleeding issues on anticoagulation.   Today, she denies symptoms of palpitations, chest pain, shortness of breath, orthopnea, PND, lower extremity edema, dizziness, presyncope, syncope, snoring, daytime somnolence, bleeding, or neurologic sequela. The patient is tolerating medications without difficulties and is otherwise without complaint today.    Atrial Fibrillation Risk Factors:  she does have symptoms of sleep apnea. she does not have a history of rheumatic fever. she does not have a history of alcohol use. The patient does not have a history of early familial atrial fibrillation or other arrhythmias.  she has a BMI of Body mass index is 29.15 kg/m.Marland Kitchen Filed  Weights   07/15/21 0900  Weight: 81.9 kg     Family History  Problem Relation Age of Onset   Arthritis Mother    Cancer Mother        colon cancer   Diabetes Mother    Hypertension Mother    Heart murmur Mother    Early death Brother    Heart murmur Brother    Lung cancer Brother    Arthritis Maternal Grandmother    Heart disease Maternal Grandmother        massive heart attack   Heart disease Paternal Grandmother    Asthma Brother    Diabetes Brother    Hypertension Brother    Heart murmur Brother    Heart disease Father        anuersym- ruptered     Atrial Fibrillation Management history:  Previous antiarrhythmic drugs: dofetilide Previous cardioversions: none Previous ablations: none CHADS2VASC score: 4 Anticoagulation history: Eliquis   Past Medical History:  Diagnosis Date   Allergy    SEASONAL   Aortic stenosis    Echo 06/2019: mod AS, mean 28 mmHg, peak velocity 3.6 m/s // Echo 9/21: EF 60-65 no RWMA, moderate LVH, normal RVSF, trivial MR, trivial AI, moderate aortic stenosis (mean gradient 36 mmHg; V-max 3.64 m/s; DI 0.18) [reviewed with Dr. Burt Knack - mod AS; rpt echo 1 yr]     CKD (chronic kidney disease)    Hypertension    Nonischemic cardiomyopathy    Tachy induced CM // TEE 12/2018: EF 30-35 // Echo 06/2019: EF 55-60 // Cor CTA 8/18: Ca score 0; no CAD   Persistent atrial fibrillation  Admx 12/19 w AF w RVR // R+L atrial thrombus on TEE - DCCV canceled; repeat TEE in 01/2019 w some residual clot // Dofetilide Rx started 07/2019 >> NSR restored without DCCV // CHADS-VASc 4 (6 if include LAA clot) >> Apixaban    Past Surgical History:  Procedure Laterality Date   TEE WITHOUT CARDIOVERSION N/A 12/06/2018   Procedure: TRANSESOPHAGEAL ECHOCARDIOGRAM (TEE);  Surgeon: Lelon Perla, MD;  Location: [facility] ENDOSCOPY;  Service: Cardiovascular;  Laterality: N/A;   TEE WITHOUT CARDIOVERSION N/A 01/20/2019   Procedure: TRANSESOPHAGEAL ECHOCARDIOGRAM (TEE);  Surgeon:  [name], MD;  Location: [facility] ENDOSCOPY;  Service: Cardiovascular;  Laterality: N/A;   TONSILLECTOMY      Current Outpatient Medications  Medication Sig Dispense Refill   acetaminophen (TYLENOL) 500 MG tablet Take 500 mg by mouth every 6 (six) hours as needed (for pain.).     atorvastatin (LIPITOR) 20 MG tablet Take 1 tablet (20 mg total) by mouth daily. 30 tablet 6   calcium-vitamin D (OSCAL WITH D) 500-200 MG-UNIT TABS tablet Take 1 tablet by mouth daily.      diltiazem (CARDIZEM CD) 300 MG 24 hr capsule Take 1 capsule by mouth once daily 90 capsule 3   dofetilide (TIKOSYN) 250 MCG capsule Take 1 capsule (250 mcg total) by mouth 2 (two) times daily. 60 capsule 11   ELIQUIS 5 MG TABS tablet TAKE 1 TABLET BY MOUTH EVERY 12 HOURS 60 tablet 11   furosemide (LASIX) 40 MG tablet Take 1 tablet by mouth once daily 90 tablet 2   hydrALAZINE (APRESOLINE) 50 MG tablet Take 1 tablet (50 mg total) by mouth 3 (three) times daily. 270 tablet 3   loratadine (CLARITIN) 10 MG tablet Take 10 mg by mouth daily as needed for allergies.     metoprolol tartrate (LOPRESSOR) 50 MG tablet Take 2 tablets by mouth twice daily 360 tablet 0   mometasone (ELOCON) 0.1 % ointment Apply topically as needed (for skin rash).     nitroGLYCERIN (NITROSTAT) 0.4 MG SL tablet Place 1 tablet (0.4 mg total) under the tongue every 5 (five) minutes as needed for chest pain. 20 tablet 0   Olopatadine HCl 0.2 % SOLN Apply 1 drop to eye daily. One drop to affected eye once daily 2.5 mL 2   Omega-3 1000 MG CAPS Take 1,000 mg by mouth daily.      VITAMIN D PO Take 1 tablet by mouth daily.     No current facility-administered medications for this encounter.    No Known Allergies  Social History   Socioeconomic History   Marital status: Divorced    Spouse name: Not on file   Number of children: Not on file   Years of education: Not on file   Highest education level: Not on file  Occupational History   Not on file   Tobacco Use   Smoking status: Former    Types: Cigarettes    Quit date: 12/05/2011    Years since quitting: 9.6   Smokeless tobacco: Never  Vaping Use   Vaping Use: Never used  Substance and Sexual Activity   Alcohol use: Yes    Alcohol/week: 1.0 standard drink    Types: 1 Glasses of wine per week    Comment: occasionally   Drug use: No   Sexual activity: Not Currently    Birth control/protection: None  Other Topics Concern   Not on file  Social History Narrative   Not on file   Social Determinants  of Health   Financial Resource Strain: Not on file  Food Insecurity: Not on file  Transportation Needs: Not on file  Physical Activity: Not on file  Stress: Not on file  Social Connections: Not on file  Intimate Partner Violence: Not on file     ROS- All systems are reviewed and negative except as per the HPI above.  Physical Exam: Vitals:   07/15/21 0900  BP: 112/60  Pulse: 60  Weight: 81.9 kg  Height: '5\' 6"'$  (1.676 m)    GEN- The patient is a well appearing female, alert and oriented x 3 today.   HEENT-head normocephalic, atraumatic, sclera clear, conjunctiva pink, hearing intact, trachea midline. Lungs- Clear to ausculation bilaterally, normal work of breathing Heart- Regular rate and rhythm, no murmurs, rubs or gallops  GI- soft, NT, ND, + BS Extremities- no clubbing, cyanosis, or edema MS- no significant deformity or atrophy Skin- no rash or lesion Psych- euthymic mood, full affect Neuro- strength and sensation are intact   Wt Readings from Last 3 Encounters:  07/15/21 81.9 kg  03/24/21 84.7 kg  02/21/21 84.5 kg    EKG today demonstrates  SR, NST Vent. rate 60 BPM PR interval 178 ms QRS duration 70 ms QT/QTcB 464/464 ms  Echo 06/18/19 demonstrated  1. Moderate hypokinesis of the left ventricular, mid-apical inferior wall.  2. The left ventricle has normal systolic function, with an ejection fraction of 55-60%. The cavity size was normal. There is  moderately increased left ventricular wall thickness. Left ventricular diastolic Doppler parameters are indeterminate.  3. The right ventricle has normal systolic function. The cavity was normal. There is no increase in right ventricular wall thickness.  4. Left atrial size was mildly dilated.  5. The aortic valve is tricuspid. Mild thickening of the aortic valve. Mild calcification of the aortic valve. Aortic valve regurgitation is trivial by color flow Doppler. Moderate stenosis of the aortic valve.  6. Peak velocity 3.71ms, mean gradient 280mg.  Epic records are reviewed at length today  Assessment and Plan:  1. Persistent atrial fibrillation Patient appears to be maintaining SR.  Continue dofetilide 250 mcg BID. QT stable. Continue metoprolol 50 mg BID Continue Eliquis 5 mg BID Check bmet/mag today.  This patients CHA2DS2-VASc Score and unadjusted Ischemic Stroke Rate (% per year) is equal to 4.8 % stroke rate/year from a score of 4  Above score calculated as 1 point each if present [CHF, HTN, DM, Vascular=MI/PAD/Aortic Plaque, Age if 65-74, or Female] Above score calculated as 2 points each if present [Age > 75, or Stroke/TIA/TE]  2. Cardiomyopathy  EF recovered No signs or symptoms of fluid overload.  3. HTN Stable, no changes today.  4. Aortic stenosis  Moderate on echo. Followed by Dr CoBurt Knack  Follow up with Dr CoBurt Knacks scheduled. AF clinic in 6 months.    [facility] Center[place] [id number]/11/2021 12:16 PM

## 2021-07-20 ENCOUNTER — Other Ambulatory Visit: Payer: Self-pay

## 2021-07-20 ENCOUNTER — Ambulatory Visit (HOSPITAL_COMMUNITY)
Admission: RE | Admit: 2021-07-20 | Discharge: 2021-07-20 | Disposition: A | Discharge status: Home / Self Care | Payer: Medicare PPO | Source: Ambulatory Visit | Attending: Physician Assistant | Admitting: Physician Assistant

## 2021-07-20 ENCOUNTER — Ambulatory Visit: Payer: Medicare PPO | Admitting: Physician Assistant

## 2021-07-20 VITALS — BP 108/60 | HR 60 | Temp 98.2°F | Resp 18 | Ht 66.5 in | Wt 177.0 lb

## 2021-07-20 DIAGNOSIS — R600 Localized edema: Secondary | ICD-10-CM | POA: Diagnosis not present

## 2021-07-20 DIAGNOSIS — M79671 Pain in right foot: Secondary | ICD-10-CM | POA: Diagnosis not present

## 2021-07-20 NOTE — Patient Instructions (Signed)
We will call you with the results of the Doppler study.  Kennieth Rad, PA-C Physician Assistant [facility] [email or web address]   Deep Vein Thrombosis  Deep vein thrombosis (DVT) is a condition in which a blood clot forms in a deep vein, such as a vein in the lower leg, thigh, pelvis, or arm. Deep veins are veins in the deep venous system. A clot is blood that has thickened into a gel or solid. This condition is serious and can be life-threatening if the clot travels to the lungs and causes a blockage (pulmonary embolism) in the arteries of the lung. A DVT can also damage veins in the leg. This can lead to long-term, or chronic, venous disease, leg pain, swelling, discoloration, and ulcers or sores (post-thrombotic syndrome). What are the causes? This condition may be caused by: A slowdown of blood flow. Damage to a vein. A condition that causes blood to clot more easily, such as certain blood-clotting disorders. What increases the risk? The following factors may make you more likely to develop this condition: Having obesity. Being older, especially older than age 56. Being inactive (sedentary lifestyle) or not moving around. This may include: Sitting or lying down for longer than 4-6 hours other than to sleep at night. Being in the hospital, having major or lengthy surgery, or having a thin, flexible tube (central line catheter) placed in a large vein. Being pregnant, giving birth, or having recently given birth. Taking medicines that contain estrogen, such as birth control or hormone replacement therapy. Using products that contain nicotine or tobacco, especially if you use hormonal birth control. Having a history of blood clots or a blood-clotting disease, a blood vessel disease (peripheral vascular disease), or congestive heart disease. Having a history of cancer, especially if being treated with chemotherapy. What are the signs  or symptoms? Symptoms of this condition include: Swelling, pain, pressure, or tenderness in an arm or a leg. An arm or a leg becoming warm, red, or discolored. A leg turning very pale. You may have a large DVT. This is rare. If the clot is in your leg, you may notice symptoms more or have worse symptomswhen you stand or walk. In some cases, there are no symptoms. How is this diagnosed? This condition is diagnosed with: Your medical history and a physical exam. Tests, such as: Blood tests to check how well your blood clots. Doppler ultrasound. This is the best way to find a DVT. Venogram. Contrast dye is injected into a vein, and X-rays are taken to check for clots. How is this treated? Treatment for this condition depends on: The cause of your DVT. The size and location of your DVT, or having more than one DVT. Your risk for bleeding or developing more clots. Other medical conditions you may have. Treatment may include: Taking a blood thinner, also called an anticoagulant, to prevent clots from forming and growing. Wearing compression stockings, if directed. Injecting medicines into the affected vein to break up the clot (catheter-directed thrombolysis). This is used only for severe DVT and only if a specialist recommends it. Specific surgical procedures, when DVT is severe or hard to treat. These may be done to: Isolate and remove your clot. Place an inferior vena cava (IVC) filter in a large vein to catch blood clots before they reach your lungs. You may get some medical treatments for 6 months or longer. Follow these instructions at home: If you are taking blood thinners: Talk with your health care provider before  you take any medicines that contain aspirin or NSAIDs, such as ibuprofen. These medicines increase your risk for dangerous bleeding. Take your medicine exactly as told, at the same time every day. Do not skip a dose. Do not take more than the prescribed dose. This is  important. Ask your health care provider about foods and medicines that could change the way your blood thinner works (may interact). Avoid these foods and medicines if you are told to do so. Avoid anything that may cause bleeding or bruising. You may bleed more easily while taking blood thinners. Be very careful when using knives, scissors, or other sharp objects. Use an electric razor instead of a blade. Avoid activities that could cause injury or bruising, and follow instructions for preventing falls. Tell your health care provider if you have had any internal bleeding, bleeding ulcers, or neurologic diseases, such as strokes or cerebral aneurysms. Wear a medical alert bracelet or carry a card that lists what medicines you take. General instructions Take over-the-counter and prescription medicines only as told by your health care provider. Return to your normal activities as told by your health care provider. Ask your health care provider what activities are safe for you. If recommended, wear compression stockings as told by your health care provider. These stockings help to prevent blood clots and reduce swelling in your legs. Keep all follow-up visits as told by your health care provider. This is important. Contact a health care provider if: You miss a dose of your blood thinner. You have new or worse pain, swelling, or redness in an arm or a leg. You have worsening numbness or tingling in an arm or a leg. You have unusual bruising. Get help right away if: You have signs or symptoms that a blood clot has moved to the lungs. These may include: Shortness of breath. Chest pain. Fast or irregular heartbeats (palpitations). Light-headedness or dizziness. Coughing up blood. You have signs or symptoms that your blood is too thin. These may include: Blood in your vomit, stool, or urine. A cut that will not stop bleeding. A menstrual period that is heavier than usual. A severe headache or  confusion. These symptoms may represent a serious problem that is an emergency. Do not wait to see if the symptoms will go away. Get medical help right away. Call your local emergency services (911 in the [place] Do not drive yourself to the hospital. Summary Deep vein thrombosis (DVT) happens when a blood clot forms in a deep vein. This may occur in the lower leg, thigh, pelvis, or arm. Symptoms affect the arm or leg and can include swelling, pain, tenderness, warmth, redness, or discoloration. This condition may be treated with medicines or compression stockings. In severe cases, surgery may be done. If you are taking blood thinners, take them exactly as told. Do not skip a dose. Do not take more than is prescribed. Get help right away if you have shortness of breath, chest pain, fast or irregular heartbeats, or blood in your vomit, urine, or stool. This information is not intended to replace advice given to you by your health care provider. Make sure you discuss any questions you have with your healthcare provider. Document Revised: 11/15/2019 Document Reviewed: 11/15/2019 Elsevier Patient Education  2022 [employer].

## 2021-07-20 NOTE — Progress Notes (Signed)
Lower extremity venous has been completed.   Preliminary results in CV Proc.   Abram Sander 07/20/2021 3:52 PM

## 2021-07-20 NOTE — Progress Notes (Signed)
Established Patient Office Visit  Subjective:  Patient ID: Tracy James, female    DOB: December 31, 1950  Age: 70 y.o. MRN: [id number]  CC:  Chief Complaint  Patient presents with   right foot pain     HPI AURIANNA James reports that she started having soreness in her right foot 4 days ago, states that when she woke up the next morning and put pressure on her foot the pain was exquisite.  Reports that she started noticing swelling and states that the swelling has increased.  States that the swelling does not improve with elevation.  Denies injury or trauma.  Denies shortness of breath.  Reports that she has been using Tylenol, ice, elevation without relief.  Does endorse significant history of blood clots, is currently on a blood thinner.    Past Medical History:  Diagnosis Date   Allergy    SEASONAL   Aortic stenosis    Echo 06/2019: mod AS, mean 28 mmHg, peak velocity 3.6 m/s // Echo 9/21: EF 60-65 no RWMA, moderate LVH, normal RVSF, trivial MR, trivial AI, moderate aortic stenosis (mean gradient 36 mmHg; V-max 3.64 m/s; DI 0.18) [reviewed with Dr. Burt Knack - mod AS; rpt echo 1 yr]     CKD (chronic kidney disease)    Hypertension    Nonischemic cardiomyopathy    Tachy induced CM // TEE 12/2018: EF 30-35 // Echo 06/2019: EF 55-60 // Cor CTA 8/18: Ca score 0; no CAD   Persistent atrial fibrillation    Admx 12/19 w AF w RVR // R+L atrial thrombus on TEE - DCCV canceled; repeat TEE in 01/2019 w some residual clot // Dofetilide Rx started 07/2019 >> NSR restored without DCCV // CHADS-VASc 4 (6 if include LAA clot) >> Apixaban     Past Surgical History:  Procedure Laterality Date   TEE WITHOUT CARDIOVERSION N/A 12/06/2018   Procedure: TRANSESOPHAGEAL ECHOCARDIOGRAM (TEE);  Surgeon: Lelon Perla, MD;  Location: [facility] ENDOSCOPY;  Service: Cardiovascular;  Laterality: N/A;   TEE WITHOUT CARDIOVERSION N/A 01/20/2019   Procedure: TRANSESOPHAGEAL ECHOCARDIOGRAM (TEE);  Surgeon: [name],  MD;  Location: [facility] ENDOSCOPY;  Service: Cardiovascular;  Laterality: N/A;   TONSILLECTOMY      Family History  Problem Relation Age of Onset   Arthritis Mother    Cancer Mother        colon cancer   Diabetes Mother    Hypertension Mother    Heart murmur Mother    Early death Brother    Heart murmur Brother    Lung cancer Brother    Arthritis Maternal Grandmother    Heart disease Maternal Grandmother        massive heart attack   Heart disease Paternal Grandmother    Asthma Brother    Diabetes Brother    Hypertension Brother    Heart murmur Brother    Heart disease Father        [email or web address]    Social History   Socioeconomic History   Marital status: Divorced    Spouse name: Not on file   Number of children: Not on file   Years of education: Not on file   Highest education level: Not on file  Occupational History   Not on file  Tobacco Use   Smoking status: Former    Types: Cigarettes    Quit date: 12/05/2011    Years since quitting: 9.6   Smokeless tobacco: Never  Vaping Use   Vaping Use: Never used  Substance and Sexual Activity   Alcohol use: Yes    Alcohol/week: 1.0 standard drink    Types: 1 Glasses of wine per week    Comment: occasionally   Drug use: No   Sexual activity: Not Currently    Birth control/protection: None  Other Topics Concern   Not on file  Social History Narrative   Not on file   Social Determinants of Health   Financial Resource Strain: Not on file  Food Insecurity: Not on file  Transportation Needs: Not on file  Physical Activity: Not on file  Stress: Not on file  Social Connections: Not on file  Intimate Partner Violence: Not on file    Outpatient Medications Prior to Visit  Medication Sig Dispense Refill   acetaminophen (TYLENOL) 500 MG tablet Take 500 mg by mouth every 6 (six) hours as needed (for pain.).     atorvastatin (LIPITOR) 20 MG tablet Take 1 tablet (20 mg total) by mouth daily. 30 tablet 6    calcium-vitamin D (OSCAL WITH D) 500-200 MG-UNIT TABS tablet Take 1 tablet by mouth daily.      diltiazem (CARDIZEM CD) 300 MG 24 hr capsule Take 1 capsule by mouth once daily 90 capsule 3   ELIQUIS 5 MG TABS tablet TAKE 1 TABLET BY MOUTH EVERY 12 HOURS 60 tablet 11   furosemide (LASIX) 40 MG tablet Take 1 tablet by mouth once daily 90 tablet 2   hydrALAZINE (APRESOLINE) 50 MG tablet Take 1 tablet (50 mg total) by mouth 3 (three) times daily. 270 tablet 3   loratadine (CLARITIN) 10 MG tablet Take 10 mg by mouth daily as needed for allergies.     metoprolol tartrate (LOPRESSOR) 50 MG tablet Take 2 tablets by mouth twice daily 360 tablet 0   mometasone (ELOCON) 0.1 % ointment Apply topically as needed (for skin rash).     nitroGLYCERIN (NITROSTAT) 0.4 MG SL tablet Place 1 tablet (0.4 mg total) under the tongue every 5 (five) minutes as needed for chest pain. 20 tablet 0   Olopatadine HCl 0.2 % SOLN Apply 1 drop to eye daily. One drop to affected eye once daily 2.5 mL 2   Omega-3 1000 MG CAPS Take 1,000 mg by mouth daily.      VITAMIN D PO Take 1 tablet by mouth daily.     dofetilide (TIKOSYN) 250 MCG capsule Take 1 capsule (250 mcg total) by mouth 2 (two) times daily. 60 capsule 11   No facility-administered medications prior to visit.    No Known Allergies  ROS Review of Systems  Constitutional: Negative.   HENT: Negative.    Eyes: Negative.   Respiratory:  Negative for shortness of breath.   Cardiovascular:  Negative for chest pain and palpitations.  Gastrointestinal: Negative.   Endocrine: Negative.   Genitourinary: Negative.   Musculoskeletal:  Positive for gait problem.  Skin:  Negative for color change.  Allergic/Immunologic: Negative.   Neurological:  Negative for dizziness, syncope, weakness and headaches.  Hematological: Negative.   Psychiatric/Behavioral: Negative.       Objective:    Physical Exam Vitals and nursing note reviewed.  Constitutional:      General: She  is not in acute distress.    Appearance: Normal appearance. She is not ill-appearing.  HENT:     Head: Normocephalic and atraumatic.     Right Ear: External ear normal.     Left Ear: External ear normal.     Nose: Nose normal.  Mouth/Throat:     Mouth: Mucous membranes are moist.     Pharynx: Oropharynx is clear.  Eyes:     Extraocular Movements: Extraocular movements intact.     Conjunctiva/sclera: Conjunctivae normal.     Pupils: Pupils are equal, round, and reactive to light.  Cardiovascular:     Rate and Rhythm: Normal rate and regular rhythm.     Pulses: Normal pulses.          Dorsalis pedis pulses are 2+ on the right side and 2+ on the left side.       Posterior tibial pulses are 2+ on the right side and 2+ on the left side.     Heart sounds: Normal heart sounds.  Pulmonary:     Effort: Pulmonary effort is normal.     Breath sounds: Normal breath sounds.  Musculoskeletal:     Cervical back: Normal range of motion and neck supple.  Feet:     Comments: Swelling noted, decreased ROM ankle and toes, warm to touch, slight tender to touch Skin:    General: Skin is warm and dry.  Neurological:     General: No focal deficit present.     Mental Status: She is oriented to person, place, and time.  Psychiatric:        Mood and Affect: Mood normal.        Behavior: Behavior normal.        Thought Content: Thought content normal.        Judgment: Judgment normal.    BP 108/60 (BP Location: Left Arm, Patient Position: Sitting, Cuff Size: Normal)   Pulse 60   Temp 98.2 F (36.8 C) (Oral)   Resp 18   Ht 5' 6.5" (1.689 m)   Wt 177 lb (80.3 kg)   SpO2 100%   BMI 28.14 kg/m  Wt Readings from Last 3 Encounters:  07/20/21 177 lb (80.3 kg)  07/15/21 180 lb 9.6 oz (81.9 kg)  03/24/21 186 lb 12.8 oz (84.7 kg)     Health Maintenance Due  Topic Date Due   Zoster Vaccines- Shingrix (1 of 2) Never done   COVID-19 Vaccine (4 - Booster for Pfizer series) 12/24/2020    There  are no preventive care reminders to display for this patient.  Lab Results  Component Value Date   TSH 0.991 12/03/2018   Lab Results  Component Value Date   WBC 8.9 07/15/2021   HGB 13.1 07/15/2021   HCT 40.2 07/15/2021   MCV 89.1 07/15/2021   PLT 301 07/15/2021   Lab Results  Component Value Date   NA 136 07/15/2021   K 4.1 07/15/2021   CO2 24 07/15/2021   GLUCOSE 96 07/15/2021   BUN 16 07/15/2021   CREATININE 1.30 (H) 07/15/2021   BILITOT 0.5 11/07/2019   ALKPHOS 85 11/07/2019   AST 25 11/07/2019   ALT 23 11/07/2019   PROT 6.8 11/07/2019   ALBUMIN 4.2 11/07/2019   CALCIUM 9.2 07/15/2021   ANIONGAP 7 07/15/2021   Lab Results  Component Value Date   CHOL 197 11/07/2019   Lab Results  Component Value Date   HDL 48 11/07/2019   Lab Results  Component Value Date   LDLCALC 126 (H) 11/07/2019   Lab Results  Component Value Date   TRIG 128 11/07/2019   Lab Results  Component Value Date   CHOLHDL 4.1 11/07/2019   Lab Results  Component Value Date   HGBA1C 6.0 (H) 11/07/2019  Assessment & Plan:   Problem List Items Addressed This Visit   None Visit Diagnoses     Right foot pain    -  Primary   Relevant Orders   VAS [place] LOWER EXTREMITY VENOUS (DVT) (Completed)   Edema of right foot       Relevant Orders   VAS [place] LOWER EXTREMITY VENOUS (DVT) (Completed)       No orders of the defined types were placed in this encounter.  1. Right foot pain Stat order placed for Doppler.  Red flags given for prompt reevaluation. - VAS [place] LOWER EXTREMITY VENOUS (DVT); Future  2. Edema of right foot  - VAS [place] LOWER EXTREMITY VENOUS (DVT); Future   I have reviewed the patient's medical history (PMH, PSH, Social History, Family History, Medications, and allergies) , and have been updated if relevant. I spent 31 minutes reviewing chart and  face to face time with patient.    Follow-up: Return if symptoms worsen or fail to improve.    Loraine Grip Mayers, PA-C

## 2021-07-20 NOTE — Progress Notes (Signed)
Patient has eaten today. Patient has taken medication today. Patient reports swelling on yesterday with pain and sharp pains. Patient reports hanging down increases and has no history of swelling in this area.

## 2021-07-21 ENCOUNTER — Ambulatory Visit
Admission: RE | Admit: 2021-07-21 | Discharge: 2021-07-21 | Disposition: A | Discharge status: Home / Self Care | Payer: Medicare PPO | Source: Ambulatory Visit | Attending: Physician Assistant | Admitting: Physician Assistant

## 2021-07-21 ENCOUNTER — Ambulatory Visit
Admission: RE | Admit: 2021-07-21 | Discharge: 2021-07-21 | Disposition: A | Discharge status: Home / Self Care | Payer: Medicare PPO | Attending: Physician Assistant | Admitting: Physician Assistant

## 2021-07-21 ENCOUNTER — Telehealth: Payer: Self-pay | Admitting: *Deleted

## 2021-07-21 DIAGNOSIS — M79671 Pain in right foot: Secondary | ICD-10-CM

## 2021-07-21 DIAGNOSIS — R600 Localized edema: Secondary | ICD-10-CM | POA: Diagnosis not present

## 2021-07-21 DIAGNOSIS — M7989 Other specified soft tissue disorders: Secondary | ICD-10-CM | POA: Diagnosis not present

## 2021-07-21 IMAGING — CR DG FOOT COMPLETE 3+V*R*
1 series · 3 of 3 positions shown · non-contrast
Comparison: None.

CLINICAL DATA: Foot pain with swelling

EXAM:
RIGHT FOOT COMPLETE - 3+ VIEW

[Series 1: dg foot complete right · 0.14mm/px · 3 of 3 slices shown]
[im 1/3]
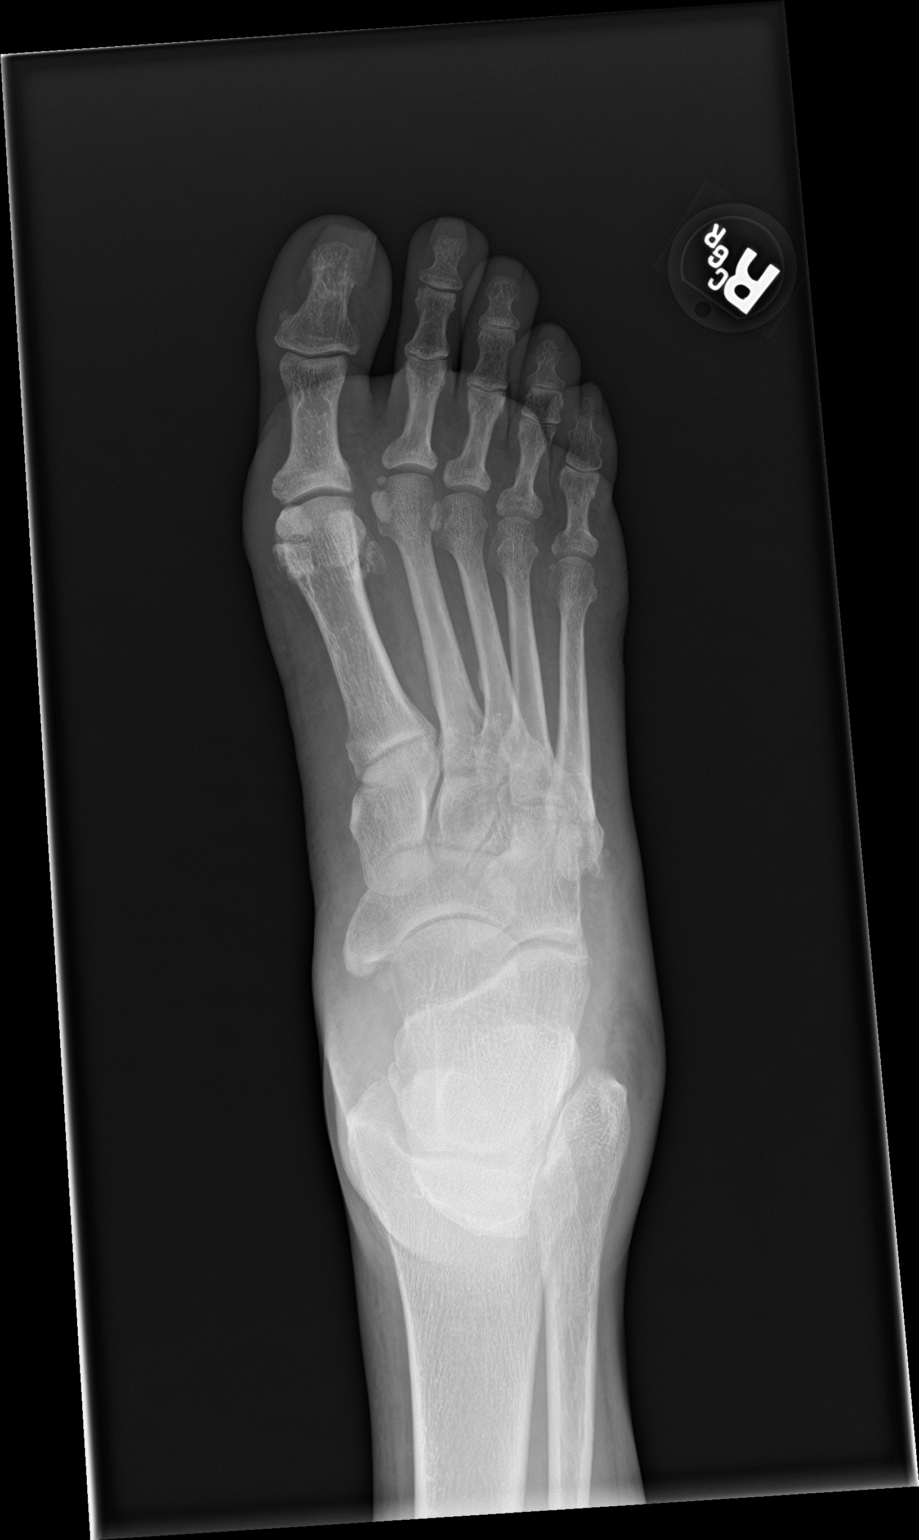
[im 2/3]
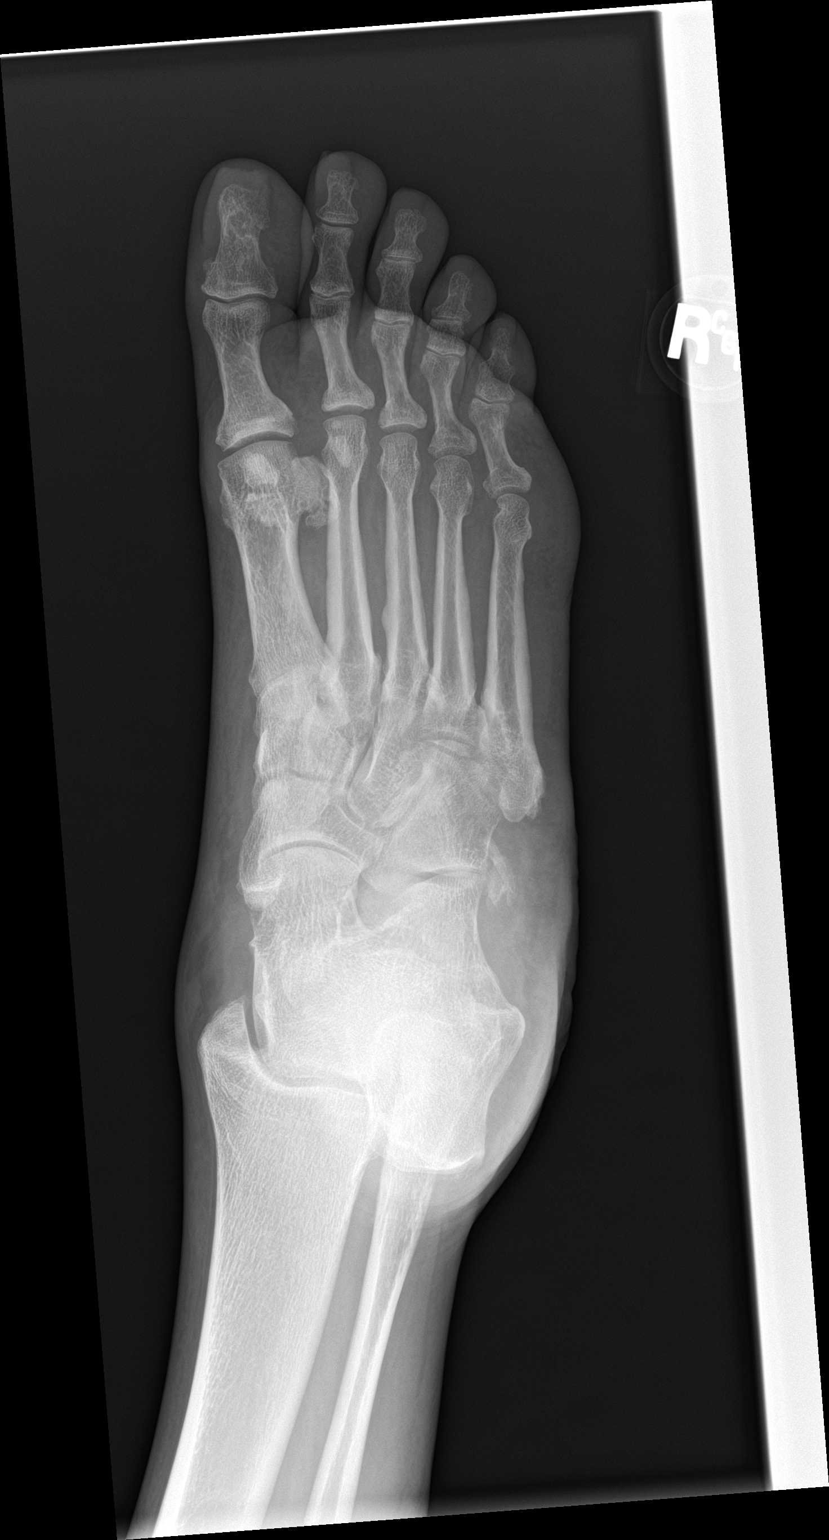
[im 3/3]
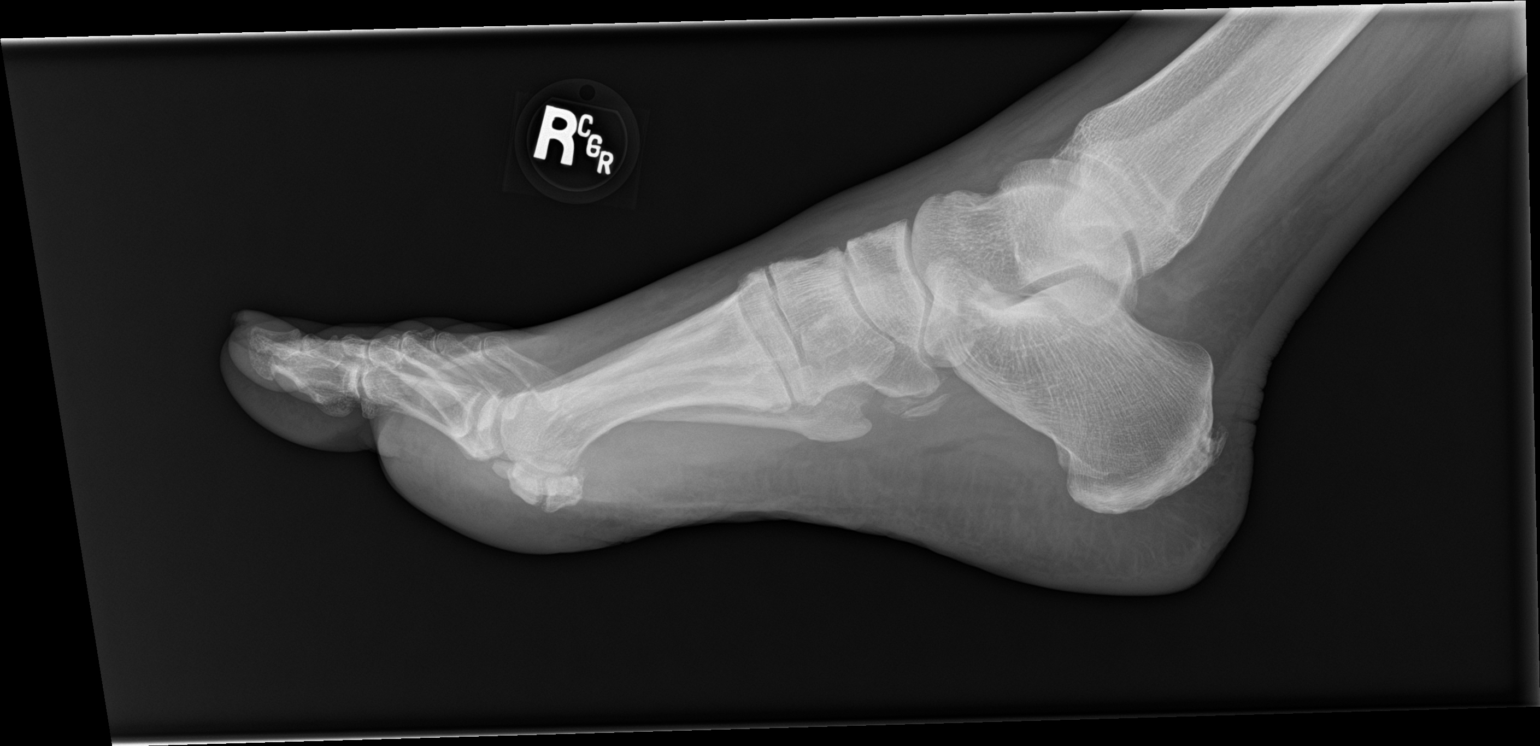

[3 of 3 positions shown; findings below may reference images not displayed]

FINDINGS: There is no evidence of fracture or dislocation. There is no
evidence of arthropathy or other focal bone abnormality. Soft
tissues are unremarkable.
IMPRESSION: Negative.

## 2021-07-21 NOTE — Telephone Encounter (Signed)
-----   Message from Kennieth Rad, [place] sent at 07/21/2021 10:02 AM EDT ----- Please call patient and let her know that her ultrasound was negative for a DVT.  For further evaluation, I will order an x-ray of foot to be completed.

## 2021-07-21 NOTE — Telephone Encounter (Signed)
Patient verified DOB Patient is aware of no DVT being present and needing to present for an XRAY. Patient plans to complete order at  regional today.

## 2021-07-21 NOTE — Addendum Note (Signed)
Addended by: Kennieth Rad on: 07/21/2021 10:02 AM   Modules accepted: Orders

## 2021-07-25 ENCOUNTER — Telehealth: Payer: Self-pay | Admitting: *Deleted

## 2021-07-25 NOTE — Telephone Encounter (Signed)
Patient verified DOB Patient is aware of no abnormalities being noted on the xray and to continue with treatment of elevation and OTC pain management.

## 2021-07-25 NOTE — Telephone Encounter (Signed)
-----   Message from Kennieth Rad, [place] sent at 07/25/2021  9:19 AM EDT ----- Please call patient and let her know that the x-ray of her right foot was negative for any type of abnormality.

## 2021-08-02 DIAGNOSIS — E663 Overweight: Secondary | ICD-10-CM | POA: Diagnosis not present

## 2021-08-02 DIAGNOSIS — E785 Hyperlipidemia, unspecified: Secondary | ICD-10-CM | POA: Diagnosis not present

## 2021-08-02 DIAGNOSIS — R519 Headache, unspecified: Secondary | ICD-10-CM | POA: Diagnosis not present

## 2021-08-02 DIAGNOSIS — R32 Unspecified urinary incontinence: Secondary | ICD-10-CM | POA: Diagnosis not present

## 2021-08-02 DIAGNOSIS — Z7901 Long term (current) use of anticoagulants: Secondary | ICD-10-CM | POA: Diagnosis not present

## 2021-08-02 DIAGNOSIS — D6869 Other thrombophilia: Secondary | ICD-10-CM | POA: Diagnosis not present

## 2021-08-02 DIAGNOSIS — I4891 Unspecified atrial fibrillation: Secondary | ICD-10-CM | POA: Diagnosis not present

## 2021-08-02 DIAGNOSIS — Z6828 Body mass index (BMI) 28.0-28.9, adult: Secondary | ICD-10-CM | POA: Diagnosis not present

## 2021-08-02 DIAGNOSIS — I10 Essential (primary) hypertension: Secondary | ICD-10-CM | POA: Diagnosis not present

## 2021-08-09 ENCOUNTER — Other Ambulatory Visit: Payer: Self-pay | Admitting: Physician Assistant

## 2021-08-26 ENCOUNTER — Ambulatory Visit (HOSPITAL_COMMUNITY): Payer: Medicare PPO | Attending: Cardiology

## 2021-08-26 ENCOUNTER — Other Ambulatory Visit: Payer: Self-pay

## 2021-08-26 DIAGNOSIS — I5022 Chronic systolic (congestive) heart failure: Secondary | ICD-10-CM

## 2021-08-26 DIAGNOSIS — I4819 Other persistent atrial fibrillation: Secondary | ICD-10-CM | POA: Diagnosis not present

## 2021-08-26 LAB — ECHOCARDIOGRAM COMPLETE
AR max vel: 0.6 cm2
AV Area VTI: 0.64 cm2
AV Area mean vel: 0.59 cm2
AV Mean grad: 36 mmHg
AV Peak grad: 59.8 mmHg
Ao pk vel: 3.87 m/s
Area-P 1/2: 3.51 cm2
S' Lateral: 2.5 cm

## 2021-09-12 ENCOUNTER — Other Ambulatory Visit: Payer: Self-pay

## 2021-09-12 ENCOUNTER — Encounter: Payer: Self-pay | Admitting: Cardiovascular Disease

## 2021-09-12 ENCOUNTER — Ambulatory Visit: Payer: Medicare PPO | Admitting: Cardiovascular Disease

## 2021-09-12 VITALS — BP 110/70 | HR 62 | Ht 66.0 in | Wt 179.8 lb

## 2021-09-12 DIAGNOSIS — I350 Nonrheumatic aortic (valve) stenosis: Secondary | ICD-10-CM | POA: Diagnosis not present

## 2021-09-12 DIAGNOSIS — I10 Essential (primary) hypertension: Secondary | ICD-10-CM | POA: Diagnosis not present

## 2021-09-12 DIAGNOSIS — Z01818 Encounter for other preprocedural examination: Secondary | ICD-10-CM | POA: Diagnosis not present

## 2021-09-12 DIAGNOSIS — I4819 Other persistent atrial fibrillation: Secondary | ICD-10-CM | POA: Diagnosis not present

## 2021-09-12 DIAGNOSIS — Z01810 Encounter for preprocedural cardiovascular examination: Secondary | ICD-10-CM

## 2021-09-12 DIAGNOSIS — I5022 Chronic systolic (congestive) heart failure: Secondary | ICD-10-CM | POA: Diagnosis not present

## 2021-09-12 DIAGNOSIS — N1831 Chronic kidney disease, stage 3a: Secondary | ICD-10-CM

## 2021-09-12 LAB — BASIC METABOLIC PANEL
BUN/Creatinine Ratio: 14 (ref 12–28)
BUN: 18 mg/dL (ref 8–27)
CO2: 24 mmol/L (ref 20–29)
Calcium: 10.4 mg/dL — ABNORMAL HIGH (ref 8.7–10.3)
Chloride: 100 mmol/L (ref 96–106)
Creatinine, Ser: 1.32 mg/dL — ABNORMAL HIGH (ref 0.57–1.00)
Glucose: 106 mg/dL — ABNORMAL HIGH (ref 70–99)
Potassium: 4.9 mmol/L (ref 3.5–5.2)
Sodium: 139 mmol/L (ref 134–144)
eGFR: 44 mL/min/{1.73_m2} — ABNORMAL LOW (ref >59)

## 2021-09-12 NOTE — Patient Instructions (Addendum)
Medication Instructions:  Your physician recommends that you continue on your current medications as directed. Please refer to the Current Medication list given to you today.  *If you need a refill on your cardiac medications before your next appointment, please call your pharmacy*   Lab Work: BMET pre CT   If you have labs (blood work) drawn today and your tests are completely normal, you will receive your results only by: [place] (if you have MyChart) OR A paper copy in the mail If you have any lab test that is abnormal or we need to change your treatment, we will call you to review the results.   Follow-Up: At [facility], you and your health needs are our priority.  As part of our continuing mission to provide you with exceptional heart care, we have created designated Provider Care Teams.  These Care Teams include your primary Cardiologist (physician) and Advanced Practice Providers (APPs -  Physician Assistants and Nurse Practitioners) who all work together to provide you with the care you need, when you need it.  We recommend signing up for the patient portal called "MyChart".  Sign up information is provided on this After Visit Summary.  MyChart is used to connect with patients for Virtual Visits (Telemedicine).  Patients are able to view lab/test results, encounter notes, upcoming appointments, etc.  Non-urgent messages can be sent to your provider as well.   To learn more about what you can do with MyChart, go to [email or web address].    Your next appointment:   6 month(s)  The format for your next appointment:   In Person  Provider:   Sherren Mocha, MD   Other Instructions    CT testing (09/26/2021):  You are scheduled for pre TAVR testing on 09/26/2021 at [facility] (Main Entrance A, Valet Parking). Please check in at 10:15AM in Radiology (first floor). Your CT scans (of chest/abdomen/pelvis/heart) will begin at 10:45AM.  Please follow these  instructions carefully:  On the Night Before the Test: Be sure to Drink plenty of water. Do not consume any caffeinated/decaffeinated beverages or chocolate 12 hours prior to your test. Do not take any antihistamines 12 hours prior to your test.  On the Day of the Test: Drink plenty of water until 1 hour prior to the test. Do not eat any food 4 hours prior to the test. You may take your regular medications prior to the test EXCEPT: HOLD LASIX and HYDRALAZINE the morning of your CTs. FEMALES- please wear underwire-free bra if available  After the Test: Drink plenty of water. After receiving IV contrast, you may experience a mild flushed feeling. This is normal. On occasion, you may experience a mild rash up to 24 hours after the test. This is not dangerous. If this occurs, you can take Benadryl 25 mg and increase your fluid intake. If you experience trouble breathing, this can be serious. If it is severe call 911 IMMEDIATELY. If it is mild, please call our office. If you take any of these medications: Glipizide/Metformin, Avandament, Glucavance, please do not take 48 hours after completing test unless otherwise instructed.

## 2021-09-12 NOTE — Progress Notes (Signed)
Cardiology Office Note:    Date:  09/12/2021   ID:  Tracy, James 1951-12-02, MRN [id number]  PCP:  Charlott Rakes, MD   [facility] Providers Cardiologist:  Sherren Mocha, MD     Referring MD: Charlott Rakes, MD   Chief Complaint  Patient presents with   Atrial Fibrillation    History of Present Illness:    Tracy James is a 70 y.o. 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  Patient's history of atrial fibrillation as outlined above.  Since maintaining sinus rhythm, LV function has normalized with most recent assessment demonstrated an LVEF of 55 to 60%.  The patient is here alone today.  She is doing well.  She has very mild exertional dyspnea which is unchanged over time.  She is physically active and does yard work and regular walking.  She has no exertional chest pain or pressure.  States she has had a nagging feeling in the left axillary region that occurred after she picked up a lot of sticks in her yard after a big storm.  This did not occur while she was exerting herself but she felt this few days afterwards.  She otherwise has had no specific symptoms.  She denies orthopnea, PND, leg swelling, lightheadedness, or syncope.  She had a recent echocardiogram to follow aortic stenosis.  This demonstrated findings that  were considered consistent with severe aortic stenosis.  See below for further discussion.  Past Medical History:  Diagnosis Date   Allergy    SEASONAL   Aortic stenosis    Echo 06/2019: mod AS, mean 28 mmHg, peak velocity 3.6 m/s // Echo 9/21: EF 60-65 no RWMA, moderate LVH, normal RVSF, trivial MR, trivial AI, moderate aortic stenosis (mean gradient 36 mmHg; V-max 3.64 m/s; DI 0.18) [reviewed with Dr. Burt Knack - mod AS; rpt echo 1 yr]     CKD (chronic kidney disease)    Hypertension    Nonischemic cardiomyopathy    Tachy induced CM // TEE 12/2018: EF 30-35 // Echo 06/2019: EF 55-60 // Cor CTA 8/18: Ca score 0; no CAD   Persistent atrial fibrillation    Admx 12/19 w AF w RVR // R+L atrial thrombus on TEE - DCCV canceled; repeat TEE in 01/2019 w some residual clot // Dofetilide Rx started 07/2019 >> NSR restored without DCCV // CHADS-VASc 4 (6 if include LAA clot) >> Apixaban     Past Surgical History:  Procedure Laterality Date   TEE WITHOUT CARDIOVERSION N/A 12/06/2018   Procedure: TRANSESOPHAGEAL ECHOCARDIOGRAM (TEE);  Surgeon: Lelon Perla, MD;  Location: [facility] ENDOSCOPY;  Service: Cardiovascular;  Laterality: N/A;   TEE WITHOUT CARDIOVERSION N/A 01/20/2019   Procedure: TRANSESOPHAGEAL ECHOCARDIOGRAM (TEE);  Surgeon: [name], MD;  Location: [facility] ENDOSCOPY;  Service: Cardiovascular;  Laterality: N/A;   TONSILLECTOMY  Current Medications: Current Meds  Medication Sig   acetaminophen (TYLENOL) 500 MG tablet Take 500 mg by mouth every 6 (six) hours as needed (for pain.).   atorvastatin (LIPITOR) 20 MG tablet Take 1 tablet (20 mg total) by mouth daily.   calcium-vitamin D (OSCAL WITH D) 500-200 MG-UNIT TABS tablet Take 1 tablet by mouth daily.    diltiazem (CARDIZEM CD) 300 MG 24 hr capsule Take 1 capsule by mouth once daily   dofetilide (TIKOSYN) 250 MCG capsule Take 1 capsule (250 mcg total) by mouth 2 (two) times daily.   ELIQUIS 5 MG TABS tablet TAKE 1 TABLET BY MOUTH EVERY  12 HOURS   furosemide (LASIX) 40 MG tablet Take 1 tablet by mouth once daily   hydrALAZINE (APRESOLINE) 50 MG tablet Take 1 tablet (50 mg total) by mouth 3 (three) times daily.   loratadine (CLARITIN) 10 MG tablet Take 10 mg by mouth daily as needed for allergies.   metoprolol tartrate (LOPRESSOR) 50 MG tablet Take 1 tablet (50 mg total) by mouth 2 (two) times daily.   mometasone (ELOCON) 0.1 % ointment Apply topically as needed (for skin rash).   nitroGLYCERIN (NITROSTAT) 0.4 MG SL tablet Place 1 tablet (0.4 mg total) under the tongue every 5 (five) minutes as needed for chest pain.   Olopatadine HCl 0.2 % SOLN Apply 1 drop to eye daily. One drop to affected eye once daily   Omega-3 1000 MG CAPS Take 1,000 mg by mouth daily.    VITAMIN D PO Take 1 tablet by mouth daily.     Allergies:   Patient has no known allergies.   Social History   Socioeconomic History   Marital status: Divorced    Spouse name: Not on file   Number of children: Not on file   Years of education: Not on file   Highest education level: Not on file  Occupational History   Not on file  Tobacco Use   Smoking status: Former    Types: Cigarettes    Quit date: 12/05/2011    Years since quitting: 9.7   Smokeless tobacco: Never  Vaping Use   Vaping Use: Never used  Substance and Sexual Activity   Alcohol use: Yes    Alcohol/week: 1.0 standard drink    Types: 1 Glasses of wine per week    Comment: occasionally   Drug use: No   Sexual activity: Not Currently    Birth control/protection: None  Other Topics Concern   Not on file  Social History Narrative   Not on file   Social Determinants of Health   Financial Resource Strain: Not on file  Food Insecurity: Not on file  Transportation Needs: Not on file  Physical Activity: Not on file  Stress: Not on file  Social Connections: Not on file     Family History: The patient's family history includes Arthritis in her maternal grandmother and mother; Asthma in  her brother; Cancer in her mother; Diabetes in her brother and mother; Early death in her brother; Heart disease in her father, maternal grandmother, and paternal grandmother; Heart murmur in her brother, brother, and mother; Hypertension in her brother and mother; Lung cancer in her brother.  ROS:   Please see the history of present illness.    All other systems reviewed and are negative.  EKGs/Labs/Other Studies Reviewed:    The following studies were reviewed today: Echo 08/26/2021:  1. Normal LV function; severe AS (mean gradient 36 mmH; AVA 0.6 cm2; DI  0.20); compared to 08/20/20, findings are similar.   2. Left ventricular ejection fraction, by estimation, is 60 to 65%. The  left ventricle has normal function. The left ventricle has no regional  wall motion abnormalities. There is mild left ventricular hypertrophy.  Left ventricular diastolic parameters  are consistent with Grade III diastolic dysfunction (restrictive).  Elevated left atrial pressure. The average left ventricular global  longitudinal strain is -19.7 %. The global longitudinal strain is normal.   3. Right ventricular systolic function is normal. The right ventricular  size is normal.   4. Left atrial size was severely dilated.   5. The mitral valve is normal in structure. Trivial mitral valve  regurgitation. No evidence of mitral stenosis.   6. The aortic valve has an indeterminant number of cusps. Aortic valve  regurgitation is trivial. Severe aortic valve stenosis.   7. The inferior vena cava is normal in size with greater than 50%  respiratory variability, suggesting right atrial pressure of 3 mmHg.   Comparison(s): 08/20/20 EF 60-65%. Moderate AS 2mmHg mean PG, 43mmHg peak  PG.   LEFT VENTRICLE  PLAX 2D  LVIDd:         3.90 cm  Diastology  LVIDs:         2.50 cm  LV e' medial:    6.68 cm/s  LV PW:         1.30 cm  LV E/e' medial:  24.0  LV IVS:        1.20 cm  LV e' lateral:   9.60 cm/s  LVOT diam:      2.00 cm  LV E/e' lateral: 16.7  LV SV:         65  LV SV Index:   34       2D Longitudinal Strain  LVOT Area:     3.14 cm 2D Strain GLS (A2C):   -14.7 %                          2D Strain GLS (A3C):   -21.6 %                          2D Strain GLS (A4C):   -22.9 %                          2D Strain GLS Avg:     -19.7 %                             3D Volume EF:                          3D EF:        55 %                          LV EDV:       124 ml                          LV ESV:       56 ml                          LV SV:        68  ml   RIGHT VENTRICLE  RV Basal diam:  3.80 cm  RV S prime:     9.53 cm/s  TAPSE (M-mode): 1.8 cm   LEFT ATRIUM             Index       RIGHT ATRIUM           Index  LA diam:        4.50 cm 2.36 cm/m  RA Pressure: 3.00 mmHg  LA Vol (A2C):   83.8 ml 43.89 ml/m RA Area:     14.20 cm  LA Vol (A4C):   84.8 ml 44.41 ml/m RA Volume:   36.20 ml  18.96 ml/m  LA Biplane Vol: 87.2 ml 45.67 ml/m   AORTIC VALVE  AV Area (Vmax):    0.60 cm  AV Area (Vmean):   0.59 cm  AV Area (VTI):     0.64 cm  AV Vmax:           386.50 cm/s  AV Vmean:          278.000 cm/s  AV VTI:            1.017 m  AV Peak Grad:      59.8 mmHg  AV Mean Grad:      36.0 mmHg  LVOT Vmax:         73.90 cm/s  LVOT Vmean:        51.900 cm/s  LVOT VTI:          0.206 m  LVOT/AV VTI ratio: 0.20     AORTA  Ao Root diam: 2.80 cm  Ao Asc diam:  2.80 cm   MITRAL VALVE                TRICUSPID VALVE                              Estimated RAP:  3.00 mmHg  MV Decel Time: 216 msec  MV E velocity: 160.00 cm/s  SHUNTS  MV A velocity: 50.60 cm/s   Systemic VTI:  0.21 m  MV E/A ratio:  3.16         Systemic Diam: 2.00 cm   Coronary CTA 07-25-2017: Aorta: Normal size. Mild diffuse calcifications in the aortic arch. No dissection.   Aortic Valve: Trileaflet, unusually thickened leaflets and mild calcifications in the non-coronary cusp. Leaflets opening can't be evaluated as only  diastolic images were obtained.   Coronary Arteries:  Normal coronary origin.  Right dominance.   RCA is a large dominant artery that gives rise to PDA and PLVB. There is no plaque.   Left main is a large artery that gives rise to LAD and LCX arteries.   LAD is a large vessel that gives rise to two diagonal branches and has no plaque.   LCX is a non-dominant artery that gives rise to one OM1 branch. There is no plaque.   Other findings:   Normal pulmonary vein drainage into the left atrium.   Normal let atrial appendage without a thrombus.   Normal size of the pulmonary artery.   IMPRESSION: 1. Coronary calcium score of 0. This was 0 percentile for age and sex matched control.   2. Normal coronary origin with right dominance.   3. No evidence of CAD.   4. Unusually thickened leaflets of the tricuspid aortic valve mild calcifications in the non-coronary cusp. Leaflets opening can't be  evaluated as only diastolic images were obtained. An echocardiogram is recommended for further evaluation.  EKG:  EKG is ordered today.  This shows normal sinus rhythm 62 bpm, nonspecific ST abnormality, otherwise within normal limits.  Recent Labs: 07/15/2021: BUN 16; Creatinine, Ser 1.30; Hemoglobin 13.1; Magnesium 2.0; Platelets 301; Potassium 4.1; Sodium 136  Recent Lipid Panel    Component Value Date/Time   CHOL 197 11/07/2019 1058   TRIG 128 11/07/2019 1058   HDL 48 11/07/2019 1058   CHOLHDL 4.1 11/07/2019 1058   CHOLHDL 3.2 09/26/2017 0910   VLDL 52 (H) 09/22/2016 0829   LDLCALC 126 (H) 11/07/2019 1058   LDLCALC 95 09/26/2017 0910     Risk Assessment/Calculations:    CHA2DS2-VASc Score = 4   This indicates a 4.8% annual risk of stroke. The patient's score is based upon: CHF History: 1 HTN History: 1 Diabetes History: 0 Stroke History: 0 Vascular Disease History: 0 Age Score: 1 Gender Score: 1          Physical Exam:    VS:  BP 110/70   Pulse 62   Ht 5\' 6"   (1.676 m)   Wt 179 lb 12.8 oz (81.6 kg)   SpO2 99%   BMI 29.02 kg/m     Wt Readings from Last 3 Encounters:  09/12/21 179 lb 12.8 oz (81.6 kg)  07/20/21 177 lb (80.3 kg)  07/15/21 180 lb 9.6 oz (81.9 kg)     GEN:  Well nourished, well developed in no acute distress HEENT: Normal NECK: No JVD; No carotid bruits LYMPHATICS: No lymphadenopathy CARDIAC: RRR, 2/6 harsh late peaking systolic murmur at the right upper sternal border. RESPIRATORY:  Clear to auscultation without rales, wheezing or rhonchi  ABDOMEN: Soft, non-tender, non-distended MUSCULOSKELETAL:  No edema; No deformity  SKIN: Warm and dry NEUROLOGIC:  Alert and oriented x 3 PSYCHIATRIC:  Normal affect   ASSESSMENT:    1. Persistent atrial fibrillation ([place])   2. Aortic valve stenosis, etiology of cardiac valve disease unspecified   3. Chronic systolic CHF (congestive heart failure) ([place])   4. Essential hypertension   5. Stage 3a chronic kidney disease (HCC)    PLAN:    In order of problems listed above:  Maintaining sinus rhythm on dofetilide.  Tolerating apixaban without bleeding problems.  Continue current management.  LV function has normalized. I personally reviewed the patient's echocardiogram.  There is some discrepancy in the valve morphology and the Doppler findings.  The valve morphologically is tricuspid and appears to only have moderate leaflet restriction.  There is some thickening of the leaflets without severe calcification.  However, the Doppler data is fairly convincing that there is at least moderately severe aortic stenosis.  The peak transaortic velocity is 3.86 m/s, peak and mean gradients are 60 and 36 mmHg, respectively.  The aortic valve dimensionless index is 0.19 and the calculated valve area 0.6 cm.  She has mild, stable symptoms of shortness of breath with some activities.  I think a gated cardiac CTA will be helpful to further assess valve morphology, aortic valve calcium score, and anatomic  suitability for TAVR.  A CTA of the chest, abdomen, and pelvis will also be checked.  Treatment options include ongoing surveillance, TAVR or conventional surgical AVR, or consideration of a clinical trial such as the Progress Trial for moderate aortic stenosis.  I will be in touch with the patient after her CTA studies are completed. The patient is stable with [place] heart association functional class  I-II symptoms.  LV function has normalized with maintenance of sinus rhythm.  She remains on metoprolol and furosemide. Blood pressure is well controlled on diltiazem, hydralazine, and metoprolol. Recent labs reviewed.  Creatinine is stable at 1.3 mg/dL.        Medication Adjustments/Labs and Tests Ordered: Current medicines are reviewed at length with the patient today.  Concerns regarding medicines are outlined above.  No orders of the defined types were placed in this encounter.  No orders of the defined types were placed in this encounter.   There are no Patient Instructions on file for this visit.   Signed, Sherren Mocha, MD  09/12/2021 9:23 AM    [place]

## 2021-09-23 ENCOUNTER — Other Ambulatory Visit: Payer: Self-pay | Admitting: Cardiovascular Disease

## 2021-09-26 ENCOUNTER — Ambulatory Visit (HOSPITAL_COMMUNITY)
Admission: RE | Admit: 2021-09-26 | Discharge: 2021-09-26 | Disposition: A | Discharge status: Home / Self Care | Payer: Medicare PPO | Source: Ambulatory Visit | Attending: Cardiovascular Disease | Admitting: Cardiovascular Disease

## 2021-09-26 ENCOUNTER — Other Ambulatory Visit: Payer: Self-pay

## 2021-09-26 DIAGNOSIS — I700 Atherosclerosis of aorta: Secondary | ICD-10-CM | POA: Diagnosis not present

## 2021-09-26 DIAGNOSIS — I350 Nonrheumatic aortic (valve) stenosis: Secondary | ICD-10-CM | POA: Diagnosis not present

## 2021-09-26 DIAGNOSIS — I5022 Chronic systolic (congestive) heart failure: Secondary | ICD-10-CM | POA: Insufficient documentation

## 2021-09-26 DIAGNOSIS — K5730 Diverticulosis of large intestine without perforation or abscess without bleeding: Secondary | ICD-10-CM | POA: Diagnosis not present

## 2021-09-26 DIAGNOSIS — I517 Cardiomegaly: Secondary | ICD-10-CM | POA: Diagnosis not present

## 2021-09-26 DIAGNOSIS — R188 Other ascites: Secondary | ICD-10-CM | POA: Diagnosis not present

## 2021-09-26 IMAGING — CT CT HEART MORP W/ CTA COR W/ SCORE W/ CA W/CM &/OR W/O CM
2 of 7 series · 11 of 20 positions shown, 13 images · IV contrast (omnipaque)
Comparison: none

Addendum:
CLINICAL DATA: Pre-op transcatheter aortic valve replacement (TAVR)

EXAM:
Cardiac TAVR CT
TECHNIQUE: The patient was scanned on a Siemens Force [REDACTED]ice scanner. A 120
kV retrospective scan was triggered in the descending thoracic aorta
at 111 HU's. Gantry rotation speed was 270 msecs and collimation was
.9 mm. The 3D data set was reconstructed in 5% intervals of the R-R
cycle. Systolic and diastolic phases were analyzed on a dedicated
work station using MPR, MIP and VRT modes. The patient received
100mL OMNIPAQUE IOHEXOL 350 MG/ML SOLN of contrast.

[Series 6: 0-90% · axial · 0.39mm/px · z∈[+1280,+1437]mm · 5 of 3920 slices shown]
[im 654/3920  vessel]
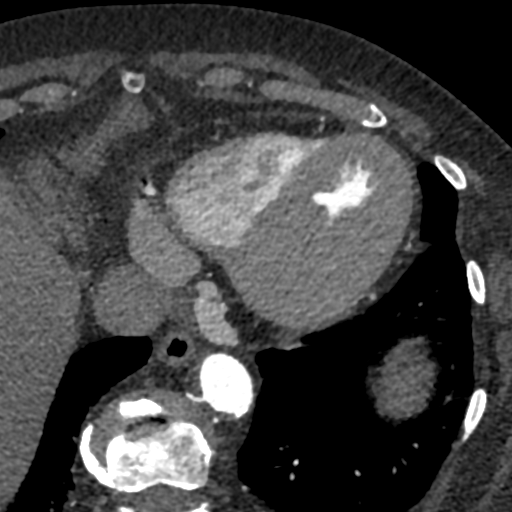
[im 1307/3920  vessel]
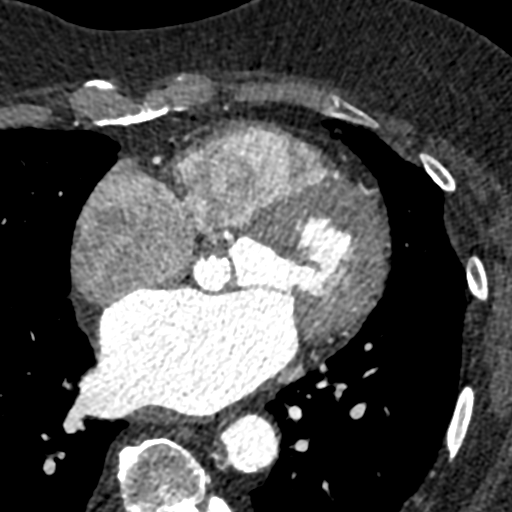
[im 1960/3920  vessel]
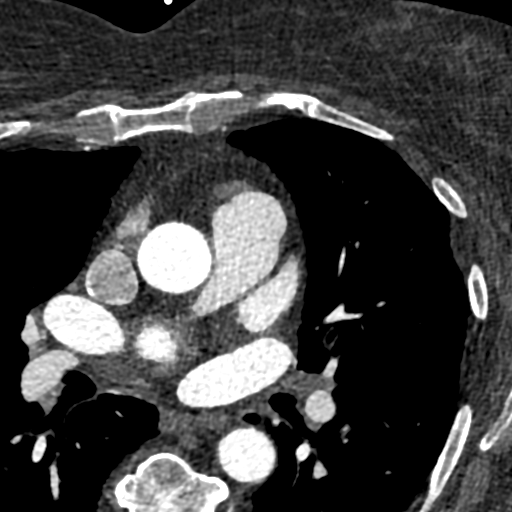
[im 2613/3920  vessel]
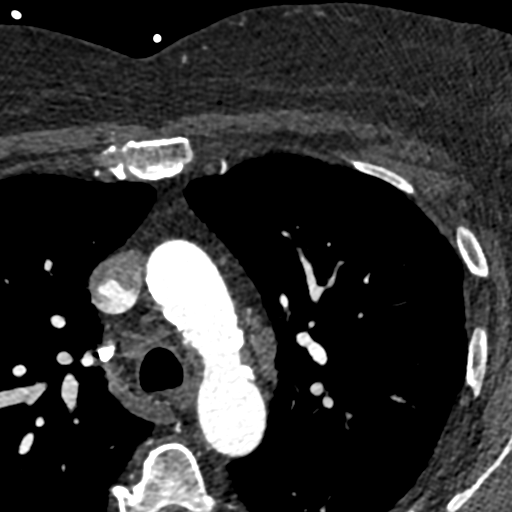
[im 3266/3920  vessel]
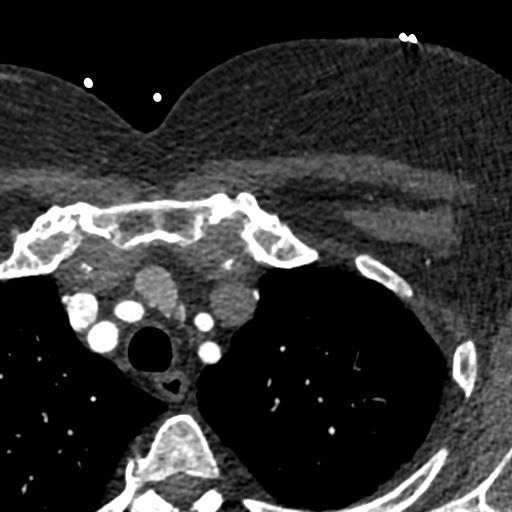

[Series 7: 5-95% · axial · 0.39mm/px · z∈[+1274,+1442]mm · 6 of 3920 slices shown, 8 images]
[im 560/3920  vessel]
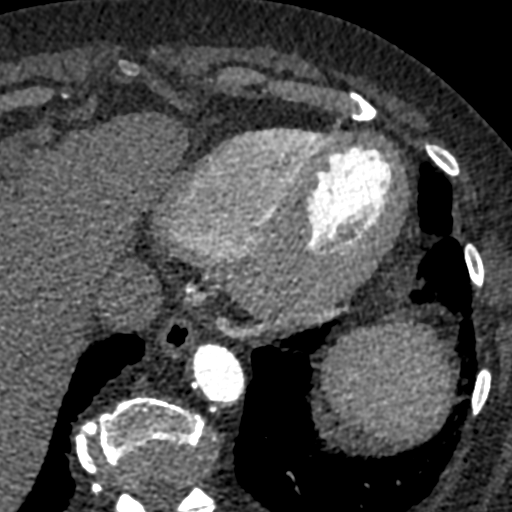
[im 560/3920  lung]
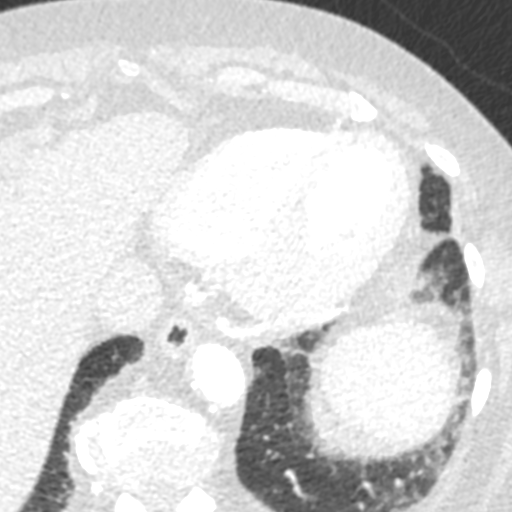
[im 1120/3920  vessel]
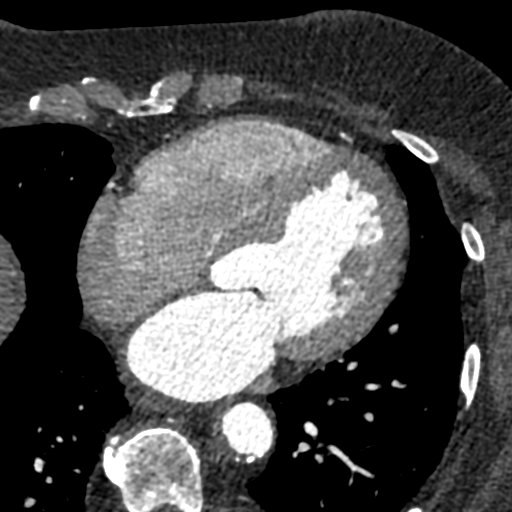
[im 1680/3920  vessel]
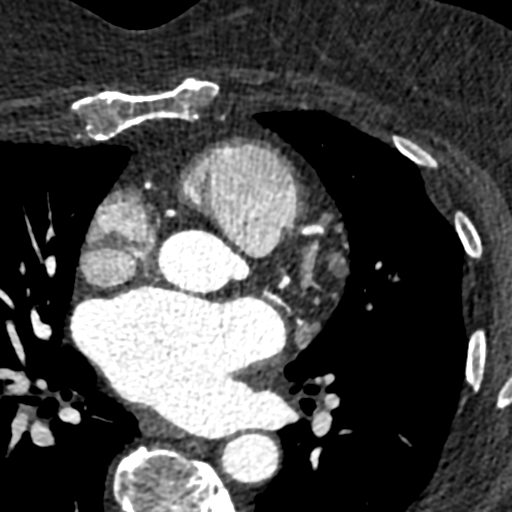
[im 2240/3920  vessel]
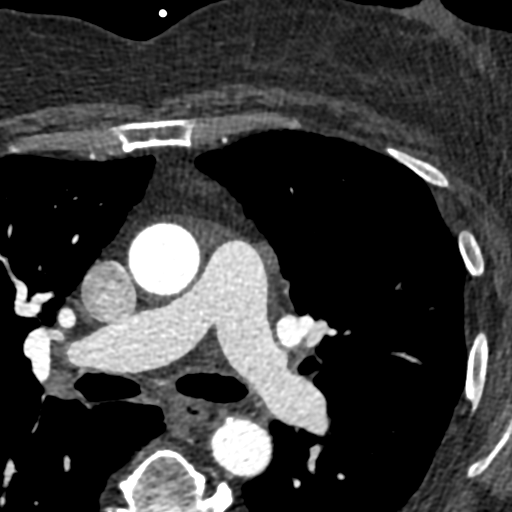
[im 2800/3920  vessel]
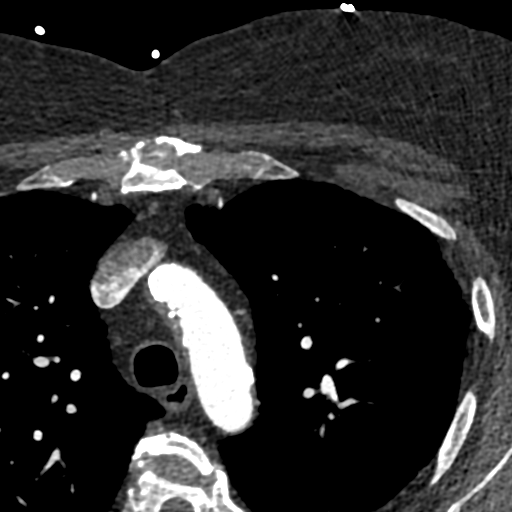
[im 2800/3920  lung]
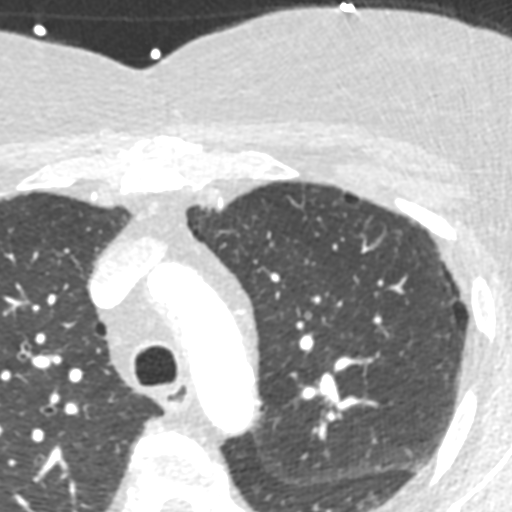
[im 3360/3920  vessel]
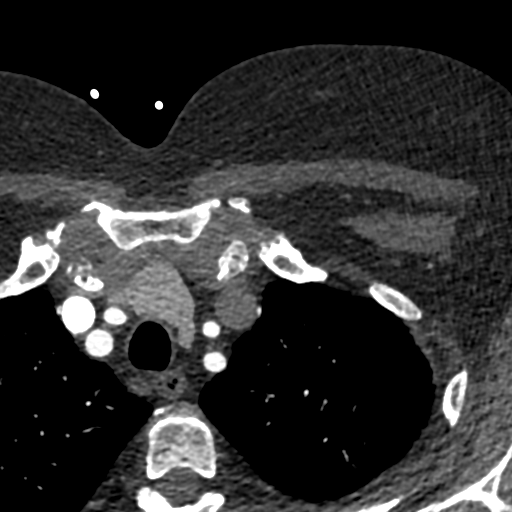

[11 of 20 positions shown; findings below may reference images not displayed]

FINDINGS: Aortic Valve: Bicuspid aortic valve with raphe between right and
left coronary cusps. Severely Reduced cusp separation. Severely
thickened, mildly calcified aortic valve cusps.

AV calcium score: 408

Virtual Basal Annulus Measurements:

Maximum/Minimum Diameter: 29.6 x 21.3 mm

Perimeter: 80.9 mm

Area: 493 mm2

No significant LVOT calcifications.

Based on these measurements, the annulus would be suitable for a 26
mm Sapien 3 valve.

Sinus of Valsalva Measurements: 29 mm x 25 mm

Sinus of Valsalva Height:

Left: 16.5 mm

Right: 19.7 mm

Aorta: Severe aortic atherosclerosis. Conventional 3 vessel branch
pattern of aortic arch. No coarctation of the aorta.

Sinotubular Junction:  25 mm

Ascending Thoracic Aorta: 28 mm

Aortic Arch:  24 mm

Descending Thoracic Aorta:  21 mm

Coronary Artery Height above Annulus:

Left Main: 12 mm

Right Coronary: 15.4 mm

Coronary Arteries: Normal coronary artery origins.

Optimum Fluoroscopic Angle for Delivery: LAO 1 ELDUZ 1

Slow flowing contrast in left atrial appendage however no definite
findings for thrombus seen.
IMPRESSION: 1. Bicuspid aortic valve with raphe between right and left coronary
cusps. Severely Reduced cusp separation. Severely thickened, mildly
calcified aortic valve cusps.

2. AV calcium score: 408

3. Annulus Area: 493 mm2. No significant LVOT calcifications. The
annulus would be suitable for a 26 mm Sapien 3 valve. Recommend
Heart Team discussion for TAVR vs. surgical aortic valve
replacement.

4.  Sufficient coronary artery height from annulus.

5. Optimum Fluoroscopic Angle for Delivery: LAO 1 ELDUZ 1

6. Slow flowing contrast in left atrial appendage however no
definite findings for thrombus seen.

7. Severe aortic atherosclerosis. Conventional 3 vessel branch
pattern of aortic arch. No coarctation of the aorta. Normal caliber
thoracic aorta.

ADDENDUM:
Extracardiac findings were described separately under dictation for
contemporaneously obtained CTA chest, abdomen and pelvis dated
[DATE]. Please see that report for full description of relevant
extracardiac findings.

*** End of Addendum ***
FINDINGS: Aortic Valve: Bicuspid aortic valve with raphe between right and
left coronary cusps. Severely Reduced cusp separation. Severely
thickened, mildly calcified aortic valve cusps.

AV calcium score: 408

Virtual Basal Annulus Measurements:

Maximum/Minimum Diameter: 29.6 x 21.3 mm

Perimeter: 80.9 mm

Area: 493 mm2

No significant LVOT calcifications.

Based on these measurements, the annulus would be suitable for a 26
mm Sapien 3 valve.

Sinus of Valsalva Measurements: 29 mm x 25 mm

Sinus of Valsalva Height:

Left: 16.5 mm

Right: 19.7 mm

Aorta: Severe aortic atherosclerosis. Conventional 3 vessel branch
pattern of aortic arch. No coarctation of the aorta.

Sinotubular Junction:  25 mm

Ascending Thoracic Aorta: 28 mm

Aortic Arch:  24 mm

Descending Thoracic Aorta:  21 mm

Coronary Artery Height above Annulus:

Left Main: 12 mm

Right Coronary: 15.4 mm

Coronary Arteries: Normal coronary artery origins.

Optimum Fluoroscopic Angle for Delivery: LAO 1 ELDUZ 1

Slow flowing contrast in left atrial appendage however no definite
findings for thrombus seen.
IMPRESSION: 1. Bicuspid aortic valve with raphe between right and left coronary
cusps. Severely Reduced cusp separation. Severely thickened, mildly
calcified aortic valve cusps.

2. AV calcium score: 408

3. Annulus Area: 493 mm2. No significant LVOT calcifications. The
annulus would be suitable for a 26 mm Sapien 3 valve. Recommend
Heart Team discussion for TAVR vs. surgical aortic valve
replacement.

4.  Sufficient coronary artery height from annulus.

5. Optimum Fluoroscopic Angle for Delivery: LAO 1 ELDUZ 1

6. Slow flowing contrast in left atrial appendage however no
definite findings for thrombus seen.

7. Severe aortic atherosclerosis. Conventional 3 vessel branch
pattern of aortic arch. No coarctation of the aorta. Normal caliber
thoracic aorta.

## 2021-09-26 IMAGING — CT CT ANGIO CHEST
2 of 7 series · 15 of 36 positions shown · IV contrast (APPLIED)
Comparison: Chest CTA [DATE].

CLINICAL DATA: 70-year-old female with history of severe aortic
stenosis. Preprocedural study prior to potential transcatheter
aortic valve replacement (TAVR) procedure.

EXAM:
CT ANGIOGRAPHY CHEST, ABDOMEN AND PELVIS
TECHNIQUE: Multidetector CT imaging through the chest, abdomen and pelvis was
performed using the standard protocol during bolus administration of
intravenous contrast. Multiplanar reconstructed images and MIPs were
obtained and reviewed to evaluate the vascular anatomy.
CONTRAST:  100mL OMNIPAQUE IOHEXOL 350 MG/ML SOLN

[Series 5: ax thins · axial · 0.59mm/px · z∈[+896,+1568]mm · 14 of 752 slices shown]
[im 40/752  lung]
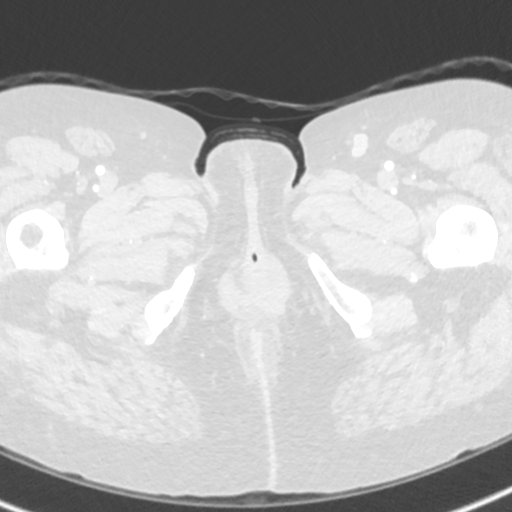
[im 80/752  mediastinal]
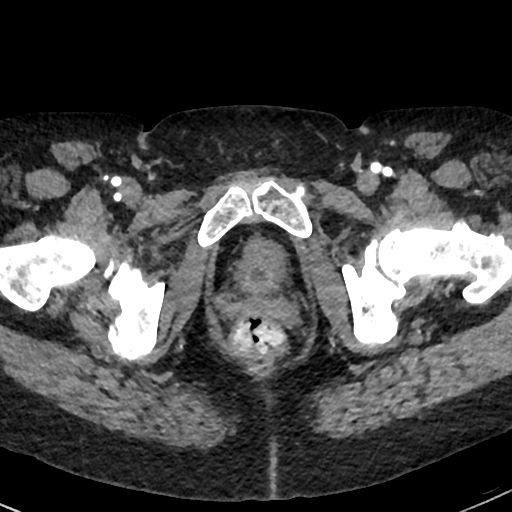
[im 159/752  lung]
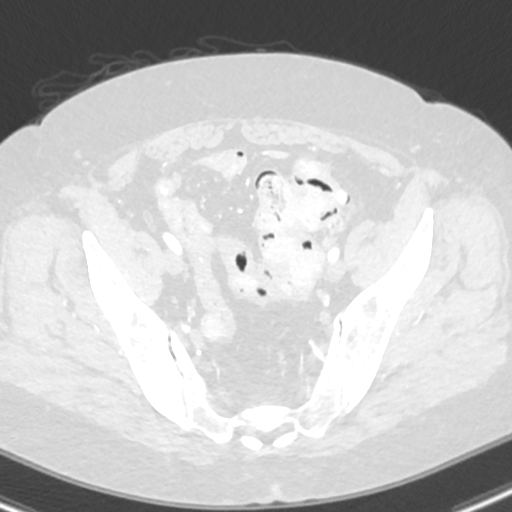
[im 198/752  mediastinal]
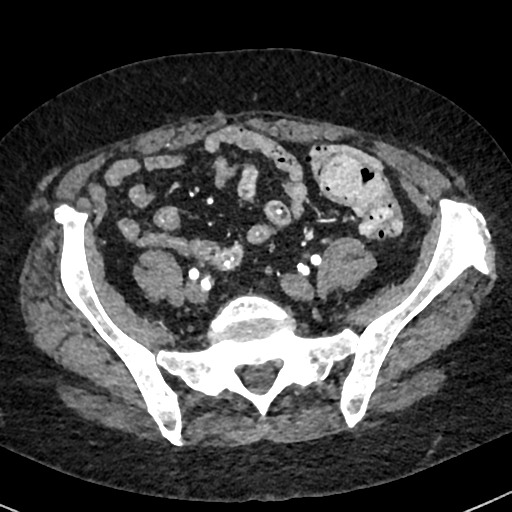
[im 238/752  lung]
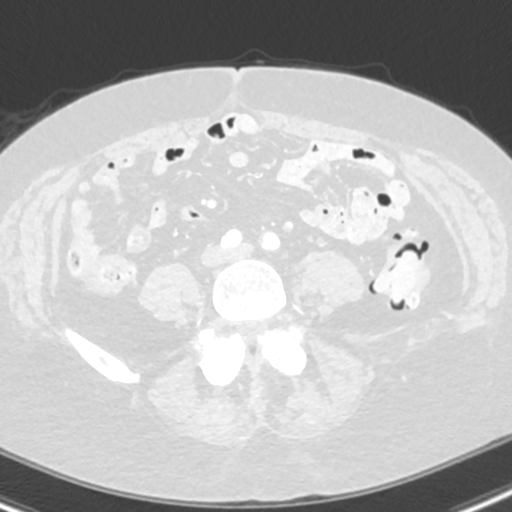
[im 317/752  mediastinal]
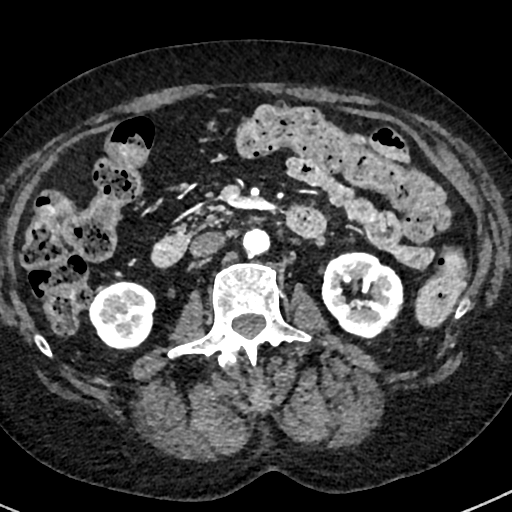
[im 356/752  lung]
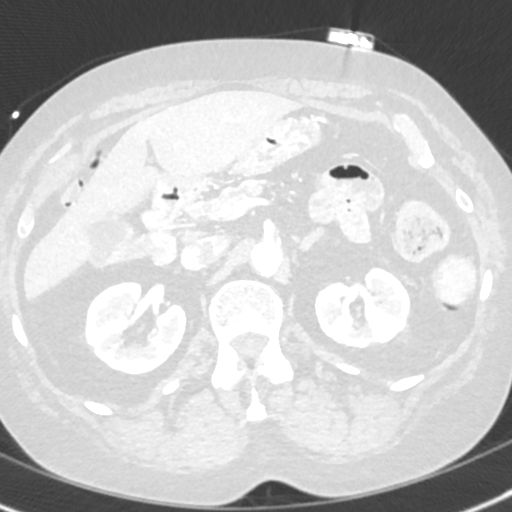
[im 396/752  mediastinal]
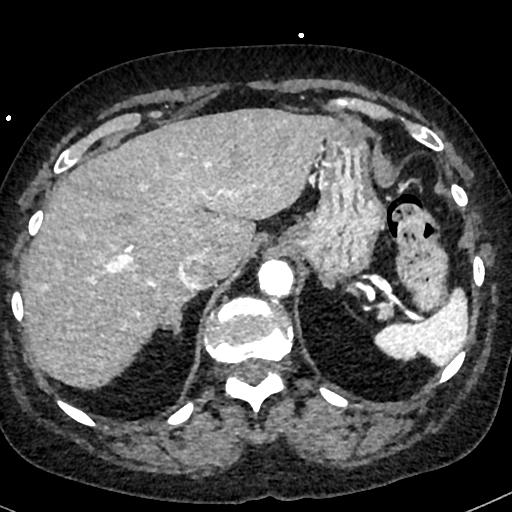
[im 435/752  lung]
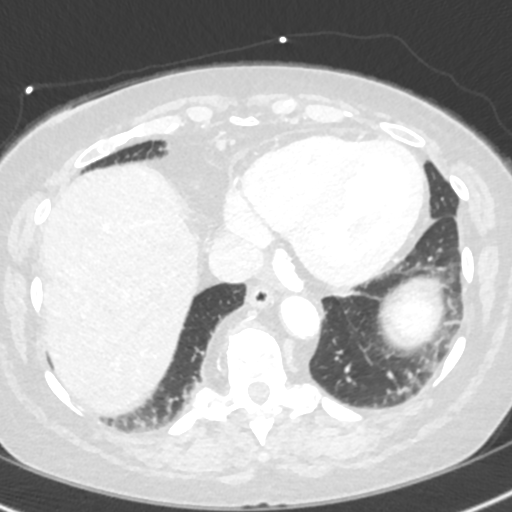
[im 514/752  mediastinal]
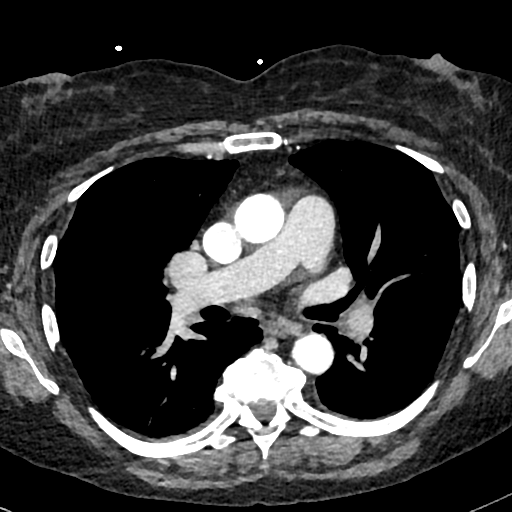
[im 554/752  lung]
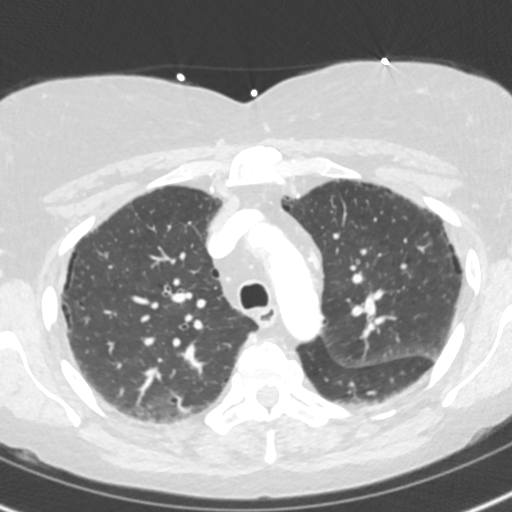
[im 593/752  mediastinal]
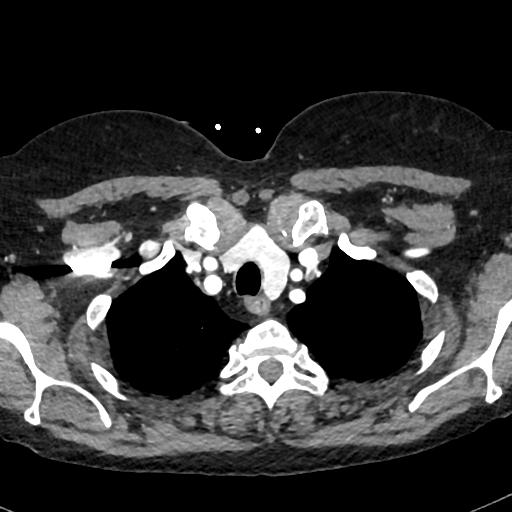
[im 672/752  lung]
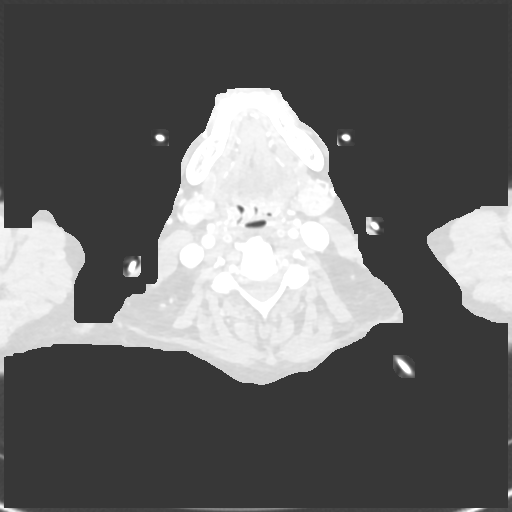
[im 712/752  mediastinal]
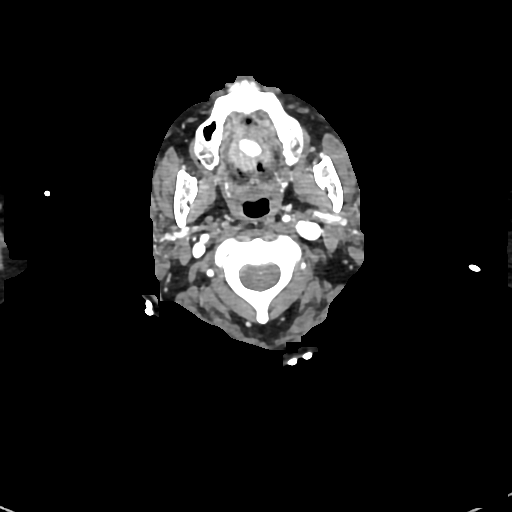

[Series 8: cor · coronal · 0.74mm/px · 1 of 139 slices shown]
[im 70/139  mediastinal]
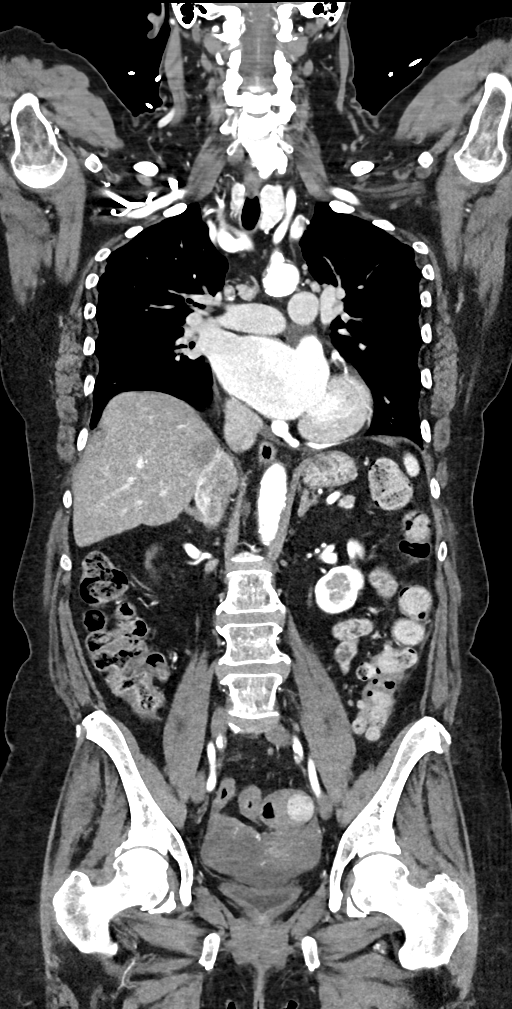

[15 of 36 positions shown; findings below may reference images not displayed]

FINDINGS: CTA CHEST FINDINGS

Cardiovascular: Heart size is mildly enlarged. There is no
significant pericardial fluid, thickening or pericardial
calcification. Atherosclerotic calcifications in the thoracic aorta.
No definite coronary artery calcifications. Thickening calcification
of the aortic valve.

Mediastinum/Lymph Nodes: No pathologically enlarged mediastinal or
hilar lymph nodes. Esophagus is unremarkable in appearance. No
axillary lymphadenopathy.

Lungs/Pleura: No acute consolidative airspace disease. No pleural
effusions. No suspicious appearing pulmonary nodules or masses are
noted. Mild paraseptal emphysema.

Musculoskeletal/Soft Tissues: There are no aggressive appearing
lytic or blastic lesions noted in the visualized portions of the
skeleton.

CTA ABDOMEN AND PELVIS FINDINGS

Hepatobiliary: No suspicious cystic or solid hepatic lesions. No
intra or extrahepatic biliary ductal dilatation. Gallbladder is
normal in appearance.

Pancreas: No pancreatic mass. No pancreatic ductal dilatation. No
pancreatic or peripancreatic fluid collections or inflammatory
changes.

Spleen: Unremarkable.

Adrenals/Urinary Tract: Mild multifocal cortical thinning in the
kidneys bilaterally. No suspicious renal lesions. No
hydroureteronephrosis. Urinary bladder is normal in appearance.
Bilateral adrenal glands are normal in appearance.

Stomach/Bowel: Normal appearance of the stomach. No pathologic
dilatation of small bowel or colon. Numerous colonic diverticulae
are noted, particularly in the descending colon and sigmoid colon,
without surrounding inflammatory changes to suggest an acute
diverticulitis at this time. Normal appendix.

Vascular/Lymphatic: Aortic atherosclerosis, without evidence of
aneurysm or dissection in the abdominal or pelvic vasculature.
Vascular findings and measurements pertinent to potential TAVR
procedure, as detailed below. No lymphadenopathy noted in the
abdomen or pelvis.

Reproductive: Uterus is unremarkable in appearance. There are
bilateral adnexal lesions measuring 5.3 x 4.1 x 4.1 cm on the right
(axial image 210 of series 6 and coronal image 76 of series 8), and
4.5 x 3.1 x 3.2 cm on the left (axial image 212 of series 6 and
coronal image 60 of series 8). Small volume of adjacent free fluid
in the cul-de-sac.

Other: Small volume of ascites in the cul-de-sac. No
pneumoperitoneum.

Musculoskeletal: There are no aggressive appearing lytic or blastic
lesions noted in the visualized portions of the skeleton.

VASCULAR MEASUREMENTS PERTINENT TO TAVR:

AORTA:

Minimal Aortic [FX] x 6 mm

Severity of Aortic Calcification-severe

RIGHT PELVIS:

Right Common Iliac Artery -

Minimal [FX] x 4.7 mm

Tortuosity-mild

Calcification-moderate to severe

Right External Iliac Artery -

Minimal [FX] x 4.6 mm

Tortuosity-mild-to-moderate

Calcification-none

Right Common Femoral Artery -

Minimal [FX] x 5.6 mm

Tortuosity-mild

Calcification-mild

LEFT PELVIS:

Left Common Iliac Artery -

Minimal [FX] x 7.1 mm

Tortuosity-mild

Calcification-moderate

Left External Iliac Artery -

Minimal [FX] x 4.8 mm

Tortuosity-mild-to-moderate

Calcification-none

Left Common Femoral Artery -

Minimal [FX] x 5.2 mm

Tortuosity-mild

Calcification-mild

Review of the MIP images confirms the above findings.
IMPRESSION: 1. Vascular findings and measurements pertinent to potential TAVR
procedure, as detailed above.
2. Thickening calcification of the aortic valve, compatible with the
reported clinical history of severe aortic stenosis.
3. Bilateral solid-appearing ovarian masses with small volume of
ascites in the cul-de-sac, highly concerning for potential ovarian
neoplasm. Further evaluation with pelvic ultrasound is strongly
recommended in the immediate future to better evaluate these
findings.
4. Colonic diverticulosis without evidence of acute diverticulitis
at this time.
5. Mild cardiomegaly.
6. Aortic atherosclerosis.

These results will be called to the ordering clinician or
representative by the Radiologist Assistant, and communication
documented in the PACS or [REDACTED].

## 2021-09-26 MED ORDER — IOHEXOL 350 MG/ML SOLN
100.0000 mL | Freq: Once | INTRAVENOUS | Status: AC | PRN
  Administered 2021-09-26: 100 mL via INTRAVENOUS

## 2021-09-27 ENCOUNTER — Telehealth: Payer: Self-pay | Admitting: Cardiovascular Disease

## 2021-09-27 DIAGNOSIS — N838 Other noninflammatory disorders of ovary, fallopian tube and broad ligament: Secondary | ICD-10-CM

## 2021-09-27 DIAGNOSIS — R9389 Abnormal findings on diagnostic imaging of other specified body structures: Secondary | ICD-10-CM

## 2021-09-27 NOTE — Telephone Encounter (Signed)
Ray with [facility] wanted to report critical findings on CT of chest -   "Bilateral solid-appearing ovarian masses with small volume of ascites in the cul-de-sac, highly concerning for potential ovarian neoplasm. Further evaluation with pelvic ultrasound is strongly recommended in the immediate future to better evaluate these findings."  Will forward to Dr. Burt Knack so he is aware.

## 2021-09-27 NOTE — Telephone Encounter (Signed)
I have Ray from [facility] calling with a report on Tracy James

## 2021-09-29 NOTE — Telephone Encounter (Signed)
Spoke with the patient at length about the aortic valve findings of bicuspid aortic valve with severe leaflet restriction, confirming echo findings. Low aortic valve calcium score noted, so still some question about moderate versus severe AS. More importantly, incidental finding of bilateral ovarian masses noted, raising concern for ovarian cancer. She is followed at the [facility]. Will make referral for further evaluation, I suspect GYN is appropriate next step. All of her questions are answered. She will await referral information.

## 2021-09-30 NOTE — Telephone Encounter (Signed)
Case discussed with Dr Burt Knack and he recommends referral to Dr Everitt Amber [phone]) for evaluation of bilateral ovarian masses. I contacted Dr Serita Grit office and was advised that she is no longer with the practice and she is now with [facility]. The office staff advised that a referral should be entered into Epic. There is another MD at the practice and Dr Burt Knack requested that pt be referred to Gynecologic Oncology for evaluation.

## 2021-10-04 ENCOUNTER — Telehealth: Payer: Self-pay | Admitting: *Deleted

## 2021-10-04 ENCOUNTER — Telehealth: Payer: Self-pay | Admitting: Cardiovascular Disease

## 2021-10-04 DIAGNOSIS — N838 Other noninflammatory disorders of ovary, fallopian tube and broad ligament: Secondary | ICD-10-CM

## 2021-10-04 DIAGNOSIS — R9389 Abnormal findings on diagnostic imaging of other specified body structures: Secondary | ICD-10-CM

## 2021-10-04 NOTE — Telephone Encounter (Signed)
I spoke with the pt and made her aware that we will refer the pt to a GYN at [facility]. Referral placed in Epic.  I contacted [facility] and they will contact the pt based on the referral in the workque.  I made the pt aware that she should expect a phone call in regards to GYN appointment.  The pt will be out of town 11/2-11/6/22. At this time we will hold off on ordering Pelvic US and have this arranged by GYN. Pt agreed with plan.

## 2021-10-04 NOTE — Telephone Encounter (Signed)
Called Dr Antionette Char office and spoke with Hermina Staggers  explained that per Dr Charisse March recommendation patient can see a gyn (if not one recommends [facility]) and also recommends an [place]

## 2021-10-04 NOTE — Telephone Encounter (Signed)
Dr Michael Litter has review the referral and she said the patient can go to gyc and if she dont have one she can call [facility] [phone]. Recommend ultrasound of pelvis

## 2021-10-10 NOTE — Addendum Note (Signed)
Addended by: Barkley Boards on: 10/10/2021 10:23 AM   Modules accepted: Orders

## 2021-10-10 NOTE — Telephone Encounter (Signed)
Per Mack Guise (pt care coordinator) the pt has been scheduled for 11-22-21 @ 9:55 with Dr. Myer Peer. I spoke with the pt and she is aware of this appointment. With this appointment being in December I will go ahead and arrange an appointment for Pelvic US. Pt scheduled for Pelvic US on 11/29 at 10:00 AM at [facility].  Pt to arrive at 9:45 AM at the main entrance and NPO after midnight. Pt aware of this appointment.

## 2021-10-13 ENCOUNTER — Other Ambulatory Visit: Payer: Self-pay | Admitting: Family Medicine

## 2021-10-13 DIAGNOSIS — I5022 Chronic systolic (congestive) heart failure: Secondary | ICD-10-CM

## 2021-10-14 NOTE — Telephone Encounter (Signed)
Requested medications are due for refill today yes  Requested medications are on the active medication list yes  Last refill 08/23/21  Last visit 03/24/21  Future visit scheduled 10/17/21  Notes to clinic failed protocol due to labs from 11/2019, please assess.  Requested Prescriptions  Pending Prescriptions Disp Refills   atorvastatin (LIPITOR) 20 MG tablet [Pharmacy Med Name: Atorvastatin Calcium 20 MG Oral Tablet] 90 tablet 0    Sig: Take 1 tablet by mouth once daily     Cardiovascular:  Antilipid - Statins Failed - 10/13/2021  8:29 PM      Failed - Total Cholesterol in normal range and within 360 days    Cholesterol, Total  Date Value Ref Range Status  11/07/2019 197 100 - 199 mg/dL Final          Failed - LDL in normal range and within 360 days    LDL Cholesterol (Calc)  Date Value Ref Range Status  09/26/2017 95 mg/dL (calc) Final    Comment:    Reference range: <100 . Desirable range <100 mg/dL for primary prevention;   <70 mg/dL for patients with CHD or diabetic patients  with > or = 2 CHD risk factors. Marland Kitchen LDL-C is now calculated using the Martin-Hopkins  calculation, which is a validated novel method providing  better accuracy than the Friedewald equation in the  estimation of LDL-C.  Cresenciano Genre et al. Annamaria Helling. 8325;498(26): 2061-2068  (http://education.[employer].com/faq/FAQ164)    LDL Chol Calc (NIH)  Date Value Ref Range Status  11/07/2019 126 (H) 0 - 99 mg/dL Final          Failed - HDL in normal range and within 360 days    HDL  Date Value Ref Range Status  11/07/2019 48 >39 mg/dL Final          Failed - Triglycerides in normal range and within 360 days    Triglycerides  Date Value Ref Range Status  11/07/2019 128 0 - 149 mg/dL Final          Passed - Patient is not pregnant      Passed - Valid encounter within last 12 months    Recent Outpatient Visits           6 months ago Allergic conjunctivitis of both eyes   [place], Charlane Ferretti, MD   10 months ago Encounter for [employer] annual wellness exam   [place] [facility], Darrick Penna, MD   10 months ago DCM (dilated cardiomyopathy) [facility]   [facility], Darrick Penna, MD   1 year ago Screening for colon cancer   [place], MD   2 years ago Persistent atrial fibrillation   [place], MD       Future Appointments             In 3 days Charlott Rakes, MD [place]

## 2021-10-17 ENCOUNTER — Ambulatory Visit: Payer: Medicare PPO | Attending: Family Medicine | Admitting: Family Medicine

## 2021-10-17 ENCOUNTER — Ambulatory Visit: Payer: Medicare PPO | Admitting: Family Medicine

## 2021-10-17 ENCOUNTER — Other Ambulatory Visit: Payer: Self-pay

## 2021-10-17 ENCOUNTER — Encounter: Payer: Self-pay | Admitting: Family Medicine

## 2021-10-17 VITALS — BP 128/76 | HR 56 | Ht 66.0 in | Wt 187.0 lb

## 2021-10-17 DIAGNOSIS — N838 Other noninflammatory disorders of ovary, fallopian tube and broad ligament: Secondary | ICD-10-CM | POA: Diagnosis not present

## 2021-10-17 DIAGNOSIS — Z1211 Encounter for screening for malignant neoplasm of colon: Secondary | ICD-10-CM

## 2021-10-17 DIAGNOSIS — Z0001 Encounter for general adult medical examination with abnormal findings: Secondary | ICD-10-CM | POA: Diagnosis not present

## 2021-10-17 DIAGNOSIS — I5022 Chronic systolic (congestive) heart failure: Secondary | ICD-10-CM | POA: Diagnosis not present

## 2021-10-17 DIAGNOSIS — Z13228 Encounter for screening for other metabolic disorders: Secondary | ICD-10-CM

## 2021-10-17 DIAGNOSIS — Z0000 Encounter for general adult medical examination without abnormal findings: Secondary | ICD-10-CM

## 2021-10-17 MED ORDER — ATORVASTATIN CALCIUM 20 MG PO TABS: 20.0000 mg | ORAL_TABLET | Freq: Every day | ORAL | 1 refills | Status: DC

## 2021-10-17 NOTE — Progress Notes (Signed)
Subjective:   Tracy James is a 70 y.o. female who presents for Medicare Annual (Subsequent) preventive examination. Last seen by cardiology Dr. Burt Knack in 09/2021 and coronary CTA from 10/04/2021 revealed AV calcium score 408, severe aortic atherosclerosis, severely thickened mildly calcified aortic valve cusps with recommendation for TAVR versus surgical replacement. She did have an incidental finding of ovarian masses on her CT angiogram of the abdomen and pelvis from 09/2021: IMPRESSION: 1. Vascular findings and measurements pertinent to potential TAVR procedure, as detailed above. 2. Thickening calcification of the aortic valve, compatible with the reported clinical history of severe aortic stenosis. 3. Bilateral solid-appearing ovarian masses with small volume of ascites in the cul-de-sac, highly concerning for potential ovarian neoplasm. Further evaluation with pelvic ultrasound is strongly recommended in the immediate future to better evaluate these findings. 4. Colonic diverticulosis without evidence of acute diverticulitis at this time. 5. Mild cardiomegaly. 6. Aortic atherosclerosis. Appointment for Pelvic US is on 11/01/21 and Gyn 11/2021. She denies presence of vaginal bleeding  Due for Shingrix but declines. Denies presence of dyspnea, chest pain or fatigue and has no additional concerns today.  Review of Systems    General: negative for fever, weight loss, appetite change Eyes: no visual symptoms. ENT: no ear symptoms, no sinus tenderness, no nasal congestion or sore throat. Neck: no pain  Respiratory: no wheezing, shortness of breath, cough Cardiovascular: no chest pain, no dyspnea on exertion, no pedal edema, no orthopnea. Gastrointestinal: no abdominal pain, no diarrhea, no constipation Genito-Urinary: no urinary frequency, no dysuria, no polyuria. Hematologic: no bruising Endocrine: no cold or heat intolerance Neurological: no headaches, no seizures, no  tremors Musculoskeletal: no joint pains, no joint swelling Skin: no pruritus, no rash. Psychological: no depression, no anxiety,         Objective:    Today's Vitals   10/17/21 0959  BP: 128/76  Pulse: (!) 56  SpO2: 100%  Weight: 187 lb (84.8 kg)  Height: 5\' 6"  (1.676 m)   Body mass index is 30.18 kg/m.  Advanced Directives 10/17/2021 11/23/2020 07/17/2019 12/06/2018 12/03/2018 07/19/2017 04/18/2017  Does Patient Have a Medical Advance Directive? No No No No No No No  Would patient like information on creating a medical advance directive? - Yes (Inpatient - patient defers creating a medical advance directive at this time - Information given) No - Patient declined - No - Patient declined No - Patient declined No - Patient declined    Current Medications (verified) Outpatient Encounter Medications as of 10/17/2021  Medication Sig   acetaminophen (TYLENOL) 500 MG tablet Take 500 mg by mouth every 6 (six) hours as needed (for pain.).   atorvastatin (LIPITOR) 20 MG tablet Take 1 tablet by mouth once daily   calcium-vitamin D (OSCAL WITH D) 500-200 MG-UNIT TABS tablet Take 1 tablet by mouth daily.    diltiazem (CARDIZEM CD) 300 MG 24 hr capsule Take 1 capsule by mouth once daily   dofetilide (TIKOSYN) 250 MCG capsule Take 1 capsule (250 mcg total) by mouth 2 (two) times daily.   ELIQUIS 5 MG TABS tablet TAKE 1 TABLET BY MOUTH EVERY 12 HOURS   furosemide (LASIX) 40 MG tablet Take 1 tablet by mouth once daily   hydrALAZINE (APRESOLINE) 50 MG tablet Take 1 tablet (50 mg total) by mouth 3 (three) times daily.   loratadine (CLARITIN) 10 MG tablet Take 10 mg by mouth daily as needed for allergies.   metoprolol tartrate (LOPRESSOR) 50 MG tablet Take 1 tablet (50 mg  total) by mouth 2 (two) times daily.   mometasone (ELOCON) 0.1 % ointment Apply topically as needed (for skin rash).   nitroGLYCERIN (NITROSTAT) 0.4 MG SL tablet Place 1 tablet (0.4 mg total) under the tongue every 5 (five) minutes  as needed for chest pain.   Olopatadine HCl 0.2 % SOLN Apply 1 drop to eye daily. One drop to affected eye once daily   Omega-3 1000 MG CAPS Take 1,000 mg by mouth daily.    VITAMIN D PO Take 1 tablet by mouth daily.   No facility-administered encounter medications on file as of 10/17/2021.    Allergies (verified) Patient has no known allergies.   History: Past Medical History:  Diagnosis Date   Allergy    SEASONAL   Aortic stenosis    Echo 06/2019: mod AS, mean 28 mmHg, peak velocity 3.6 m/s // Echo 9/21: EF 60-65 no RWMA, moderate LVH, normal RVSF, trivial MR, trivial AI, moderate aortic stenosis (mean gradient 36 mmHg; V-max 3.64 m/s; DI 0.18) [reviewed with Dr. Burt Knack - mod AS; rpt echo 1 yr]     CKD (chronic kidney disease)    Hypertension    Nonischemic cardiomyopathy    Tachy induced CM // TEE 12/2018: EF 30-35 // Echo 06/2019: EF 55-60 // Cor CTA 8/18: Ca score 0; no CAD   Persistent atrial fibrillation    Admx 12/19 w AF w RVR // R+L atrial thrombus on TEE - DCCV canceled; repeat TEE in 01/2019 w some residual clot // Dofetilide Rx started 07/2019 >> NSR restored without DCCV // CHADS-VASc 4 (6 if include LAA clot) >> Apixaban    Past Surgical History:  Procedure Laterality Date   TEE WITHOUT CARDIOVERSION N/A 12/06/2018   Procedure: TRANSESOPHAGEAL ECHOCARDIOGRAM (TEE);  Surgeon: Lelon Perla, MD;  Location[facility] ENDOSCOPY;  Service: Cardiovascular;  Laterality: N/A;   TEE WITHOUT CARDIOVERSION N/A 01/20/2019   Procedure: TRANSESOPHAGEAL ECHOCARDIOGRAM (TEE);  Surgeon: [name], MD;  Location: [facility] ENDOSCOPY;  Service: Cardiovascular;  Laterality: N/A;   TONSILLECTOMY     Family History  Problem Relation Age of Onset   Arthritis Mother    Cancer Mother        colon cancer   Diabetes Mother    Hypertension Mother    Heart murmur Mother    Early death Brother    Heart murmur Brother    Lung cancer Brother    Arthritis Maternal Grandmother    Heart disease  Maternal Grandmother        massive heart attack   Heart disease Paternal Grandmother    Asthma Brother    Diabetes Brother    Hypertension Brother    Heart murmur Brother    Heart disease Father        [id number] [id number]   Social History   Socioeconomic History   Marital status: Divorced    Spouse name: Not on file   Number of children: Not on file   Years of education: Not on file   Highest education level: Not on file  Occupational History   Not on file  Tobacco Use   Smoking status: Former    Types: Cigarettes    Quit date: 12/05/2011    Years since quitting: 9.8   Smokeless tobacco: Never  Vaping Use   Vaping Use: Never used  Substance and Sexual Activity   Alcohol use: Yes    Alcohol/week: 1.0 standard drink    Types: 1 Glasses of wine per week  Comment: occasionally   Drug use: No   Sexual activity: Not Currently    Birth control/protection: None  Other Topics Concern   Not on file  Social History Narrative   Not on file   Social Determinants of Health   Financial Resource Strain: Not on file  Food Insecurity: Not on file  Transportation Needs: Not on file  Physical Activity: Not on file  Stress: Not on file  Social Connections: Not on file    Tobacco Counseling Counseling given: Not Answered   Clinical Intake:  Pre-visit preparation completed: No  Pain : No/denies pain     Diabetes: No     Diabetic?No  Interpreter Needed?: No      Activities of Daily Living In your present state of health, do you have any difficulty performing the following activities: 10/17/2021  Hearing? N  Vision? N  Difficulty concentrating or making decisions? N  Walking or climbing stairs? N  Dressing or bathing? N  Doing errands, shopping? N  Preparing Food and eating ? N  Using the Toilet? N  In the past six months, have you accidently leaked urine? Y  Do you have problems with loss of bowel control? N  Managing your Medications? N  Managing your  Finances? N  Housekeeping or managing your Housekeeping? N  Some recent data might be hidden    Patient Care Team: Charlott Rakes, MD as PCP - General (Family Medicine) Sherren Mocha, MD as PCP - Cardiology (Cardiology)  Indicate any recent Medical Services you may have received from other than [facility] providers in the past year (date may be approximate).     Constitutional: normal appearing,  Eyes: PERRLA HEENT: Head is atraumatic, normal sinuses, normal oropharynx, normal appearing tonsils and palate, tympanic membrane is normal bilaterally. Neck: normal range of motion, no thyromegaly, no JVD Cardiovascular: normal rate and rhythm, normal heart sounds, +murmurs, rub or gallop, no pedal edema Respiratory: Normal breath sounds, clear to auscultation bilaterally, no wheezes, no rales, no rhonchi Abdomen: soft, not tender to palpation, normal bowel sounds, no enlarged organs Musculoskeletal: Full ROM, no tenderness in joints Skin: warm and dry, no lesions. Neurological: alert, oriented x3, cranial nerves I-XII grossly intact , normal motor strength, normal sensation. Psychological: normal mood.  Assessment:   This is a routine wellness examination for Deion.  Hearing/Vision screen Vision Screening   Right eye Left eye Both eyes  Without correction 20/40 20/70   With correction 20/25 20/25     Dietary issues and exercise activities discussed:     Goals Addressed   None    Depression Screen PHQ 2/9 Scores 10/17/2021 07/20/2021 03/24/2021 11/23/2020 01/15/2019 10/04/2018 08/16/2018  PHQ - 2 Score 0 0 0 0 0 0 0  PHQ- 9 Score - - 1 - 2 0 1    Fall Risk Fall Risk  10/17/2021 03/24/2021 11/23/2020 01/15/2019 12/03/2018  Falls in the past year? 0 0 0 0 0  Number falls in past yr: 0 0 0 - -  Injury with Fall? 0 0 0 - -  Risk for fall due to : - - No Fall Risks - -  Follow up - - Falls evaluation completed - -    FALL RISK PREVENTION PERTAINING TO THE HOME:  Any stairs in or  around the home? Yes  If so, are there any without handrails? No  Home free of loose throw rugs in walkways, pet beds, electrical cords, etc? Yes  Adequate lighting in your home to reduce  risk of falls? Yes   ASSISTIVE DEVICES UTILIZED TO PREVENT FALLS:  Life alert? No  Use of a cane, walker or w/c? No  Grab bars in the bathroom? Yes  Shower chair or bench in shower? No  Elevated toilet seat or a handicapped toilet? No   TIMED UP AND GO:  Was the test performed? Yes .  Length of time to ambulate 10 feet: 8 sec.   Gait slow and steady without use of assistive device  Cognitive Function: MMSE - Mini Mental State Exam 11/23/2020 11/23/2020  Orientation to time 5 5  Orientation to Place 5 5  Registration 3 -  Attention/ Calculation 5 5  Recall 2 -  Language- name 2 objects 2 -  Language- repeat 1 -  Language- follow 3 step command 3 -  Language- read & follow direction 1 -  Write a sentence 1 -  Copy design 1 -  Total score 29 -        Immunizations Immunization History  Administered Date(s) Administered   PFIZER(Purple Top)SARS-COV-2 Vaccination 02/11/2020, 03/10/2020, 09/23/2020   Pneumococcal Conjugate-13 12/04/2018   Pneumococcal Polysaccharide-23 01/15/2019    Qualifies for Shingles Vaccine? Yes   Zostavax completed No   Shingrix Completed?: No.    Education has been provided regarding the importance of this vaccine. Patient has been advised to call insurance company to determine out of pocket expense if they have not yet received this vaccine. Advised may also receive vaccine at local pharmacy or Health Dept. Verbalized acceptance and understanding.  Screening Tests Health Maintenance  Topic Date Due   Zoster Vaccines- Shingrix (1 of 2) Never done   COVID-19 Vaccine (4 - Booster for Pfizer series) 11/18/2020   COLONOSCOPY (Pts 45-1yrs Insurance coverage will need to be confirmed)  11/23/2021 (Originally 07/10/2016)   INFLUENZA VACCINE  12/04/2048 (Originally  07/04/2021)   TETANUS/TDAP  12/04/2048 (Originally 07/14/1970)   MAMMOGRAM  01/14/2023   Pneumonia Vaccine 53+ Years old  Completed   DEXA SCAN  Completed   Hepatitis C Screening  Completed   HPV VACCINES  Aged Out    Health Maintenance  Health Maintenance Due  Topic Date Due   Zoster Vaccines- Shingrix (1 of 2) Never done   COVID-19 Vaccine (4 - Booster for Pfizer series) 11/18/2020   Cologuard ordered today.  Mammogram status: Completed 01/2021. Repeat every year  Bone density completed in 2017   Additional Screening:  Hepatitis C Screening: does qualify; Completed 09/2016  Vision Screening: Recommended annual ophthalmology exams for early detection of glaucoma and other disorders of the eye. Is the patient up to date with their annual eye exam?  Yes  Who is the provider or what is the name of the office in which the patient attends annual eye exams? Unknown If pt is not established with a provider, would they like to be referred to a provider to establish care? No .   Dental Screening: Recommended annual dental exams for proper oral hygiene  Community Resource Referral / Chronic Care Management: CRR required this visit?  No   CCM required this visit?  No      Lab Results  Component Value Date   HGBA1C 6.0 (H) 11/07/2019    Plan:    1. Encounter for Medicare annual wellness exam Counseled on 150 minutes of exercise per week, healthy eating (including decreased daily intake of saturated fats, cholesterol, added sugars, sodium), routine healthcare maintenance.   2. Chronic systolic CHF (congestive heart failure) (HCC) EF of  66 5%, severe aortic stenosis from echo 08/2021 Followed by cardiology - atorvastatin (LIPITOR) 20 MG tablet; Take 1 tablet (20 mg total) by mouth daily.  Dispense: 90 tablet; Refill: 1 - LP+Non-HDL Cholesterol; Future  3. Screening for colon cancer Declines colonoscopy - Cologuard  4. Screening for metabolic disorder - Hemoglobin A1c;  Future  5. Mass, ovarian Has upcoming appointment for pelvic ultrasound and GYN appointment  I have personally reviewed and noted the following in the patient's chart:   Medical and social history Use of alcohol, tobacco or illicit drugs  Current medications and supplements including opioid prescriptions.  Functional ability and status Nutritional status Physical activity Advanced directives List of other physicians Hospitalizations, surgeries, and ER visits in previous 12 months Vitals Screenings to include cognitive, depression, and falls Referrals and appointments  In addition, I have reviewed and discussed with patient certain preventive protocols, quality metrics, and best practice recommendations. A written personalized care plan for preventive services as well as general preventive health recommendations were provided to patient.     Charlott Rakes, MD   10/17/2021

## 2021-10-17 NOTE — Progress Notes (Signed)
Medicare wellness

## 2021-10-17 NOTE — Patient Instructions (Signed)
  [name] , Thank you for taking time to come for your Medicare Wellness Visit. I appreciate your ongoing commitment to your health goals. Please review the following plan we discussed and let me know if I can assist you in the future.   These are the goals we discussed:  Goals   None     This is a list of the screening recommended for you and due dates:  Health Maintenance  Topic Date Due   Cologuard (Stool DNA test)  Never done   COVID-19 Vaccine (4 - Booster for Pfizer series) 11/18/2020   Zoster (Shingles) Vaccine (1 of 2) 01/17/2022*   Flu Shot  12/04/2048*   Tetanus Vaccine  12/04/2048*   Mammogram  01/14/2023   Pneumonia Vaccine  Completed   DEXA scan (bone density measurement)  Completed   Hepatitis C Screening: USPSTF Recommendation to screen - Ages 60-79 yo.  Completed   HPV Vaccine  Aged Out  *Topic was postponed. The date shown is not the original due date.

## 2021-10-19 ENCOUNTER — Other Ambulatory Visit: Payer: Self-pay

## 2021-10-19 ENCOUNTER — Ambulatory Visit: Payer: Medicare PPO | Attending: Family Medicine

## 2021-10-19 DIAGNOSIS — I5022 Chronic systolic (congestive) heart failure: Secondary | ICD-10-CM | POA: Diagnosis not present

## 2021-10-19 DIAGNOSIS — Z13228 Encounter for screening for other metabolic disorders: Secondary | ICD-10-CM | POA: Diagnosis not present

## 2021-10-20 LAB — LP+NON-HDL CHOLESTEROL
Cholesterol, Total: 144 mg/dL (ref 100–199)
HDL: 40 mg/dL (ref >39)
LDL Chol Calc (NIH): 81 mg/dL (ref 0–99)
Total Non-HDL-Chol (LDL+VLDL): 104 mg/dL (ref 0–129)
Triglycerides: 126 mg/dL (ref 0–149)
VLDL Cholesterol Cal: 23 mg/dL (ref 5–40)

## 2021-10-20 LAB — HEMOGLOBIN A1C
Est. average glucose Bld gHb Est-mCnc: 126 mg/dL
Hgb A1c MFr Bld: 6 % — ABNORMAL HIGH (ref 4.8–5.6)

## 2021-10-21 ENCOUNTER — Telehealth: Payer: Self-pay

## 2021-10-21 NOTE — Telephone Encounter (Signed)
-----   Message from Charlott Rakes, MD sent at 10/20/2021 12:12 PM EST ----- Please inform the patient that labs are normal. Thank you.

## 2021-10-21 NOTE — Telephone Encounter (Signed)
Patient name and DOB has been verified Patient was informed of lab results. Patient had no questions.  

## 2021-10-24 DIAGNOSIS — Z1211 Encounter for screening for malignant neoplasm of colon: Secondary | ICD-10-CM | POA: Diagnosis not present

## 2021-10-29 LAB — COLOGUARD: COLOGUARD: NEGATIVE

## 2021-11-01 ENCOUNTER — Ambulatory Visit
Admission: RE | Admit: 2021-11-01 | Discharge: 2021-11-01 | Disposition: A | Discharge status: Home / Self Care | Payer: Medicare PPO | Source: Ambulatory Visit | Attending: Cardiovascular Disease | Admitting: Cardiovascular Disease

## 2021-11-01 ENCOUNTER — Telehealth: Payer: Self-pay

## 2021-11-01 ENCOUNTER — Other Ambulatory Visit: Payer: Self-pay

## 2021-11-01 DIAGNOSIS — N838 Other noninflammatory disorders of ovary, fallopian tube and broad ligament: Secondary | ICD-10-CM | POA: Insufficient documentation

## 2021-11-01 DIAGNOSIS — R9389 Abnormal findings on diagnostic imaging of other specified body structures: Secondary | ICD-10-CM | POA: Diagnosis not present

## 2021-11-01 DIAGNOSIS — N83201 Unspecified ovarian cyst, right side: Secondary | ICD-10-CM | POA: Diagnosis not present

## 2021-11-01 DIAGNOSIS — R1909 Other intra-abdominal and pelvic swelling, mass and lump: Secondary | ICD-10-CM | POA: Diagnosis not present

## 2021-11-01 DIAGNOSIS — Z780 Asymptomatic menopausal state: Secondary | ICD-10-CM | POA: Diagnosis not present

## 2021-11-01 IMAGING — US US PELVIS COMPLETE WITH TRANSVAGINAL
1 series · 14 of 25 positions shown · non-contrast
Comparison: CT angio chest abdomen pelvis [DATE]

CLINICAL DATA: Ovarian mass seen on CT; postmenopausal



[Series 1: us pelvis complete · 67 acquisitions, 14 frames shown]
[im 1/67]
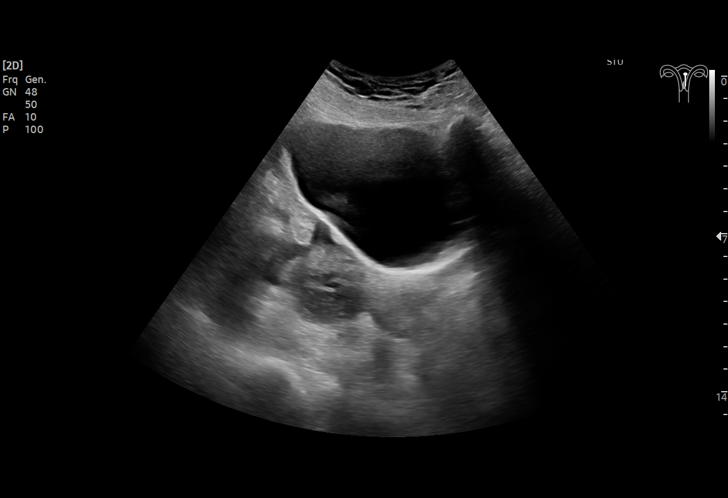
[im 6/67]
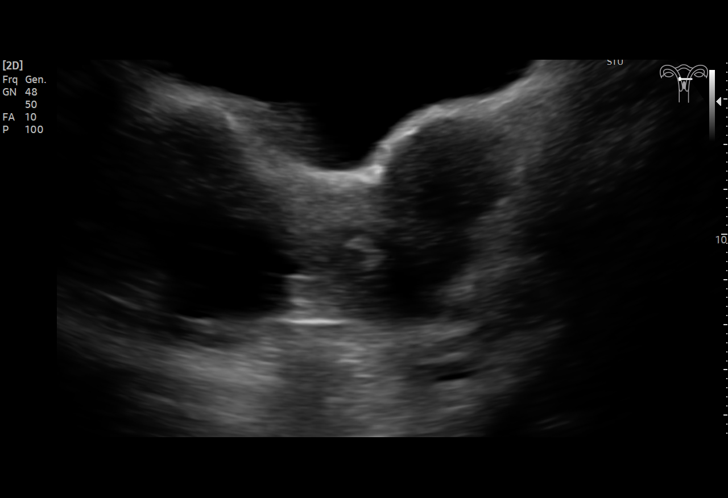
[im 12/67]
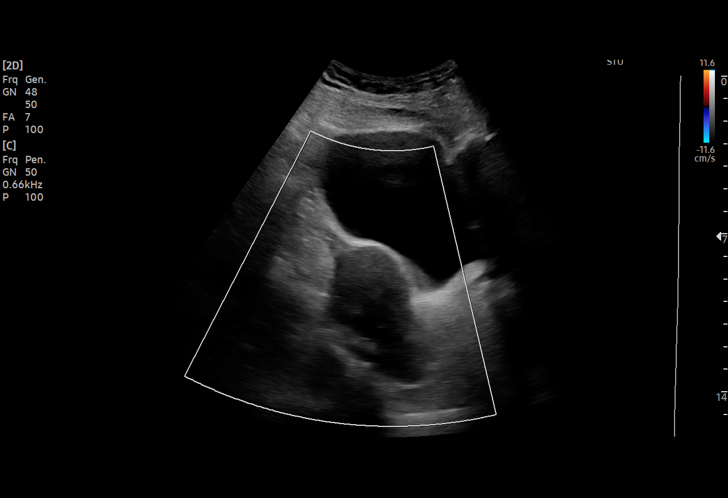
[im 17/67]
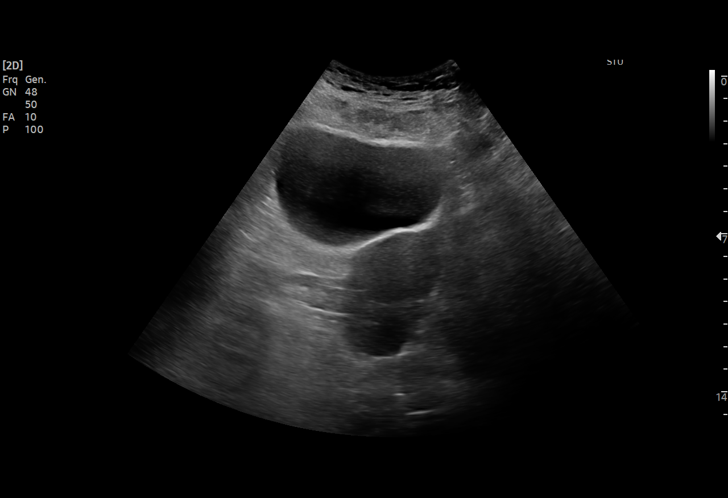
[im 23/67]
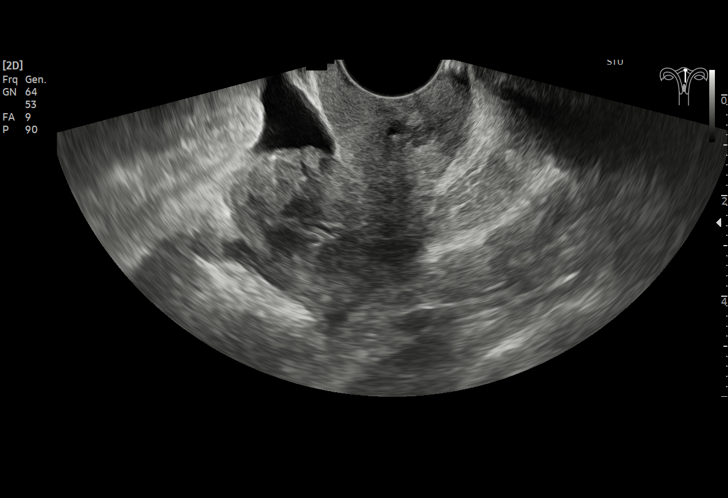
[im 25/67]
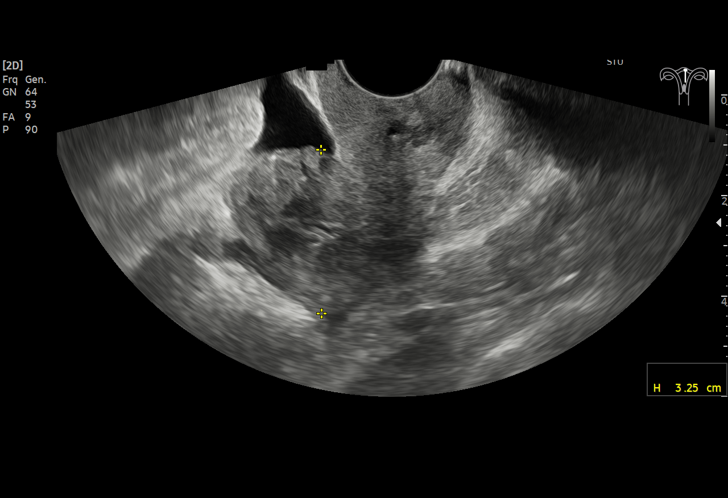
[im 31/67]
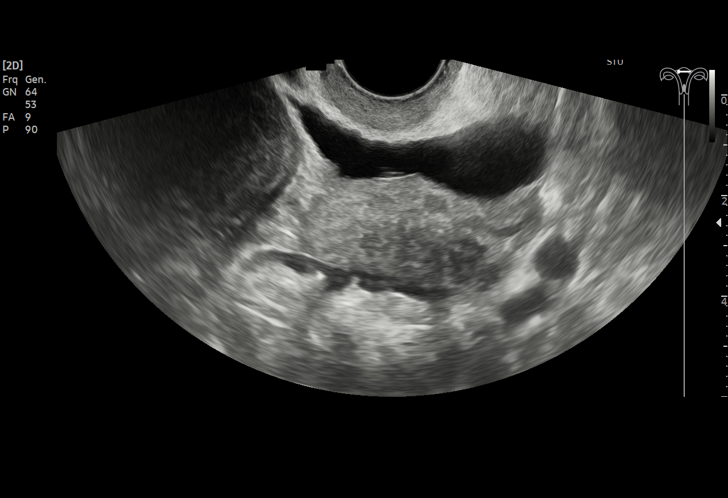
[im 36/67]
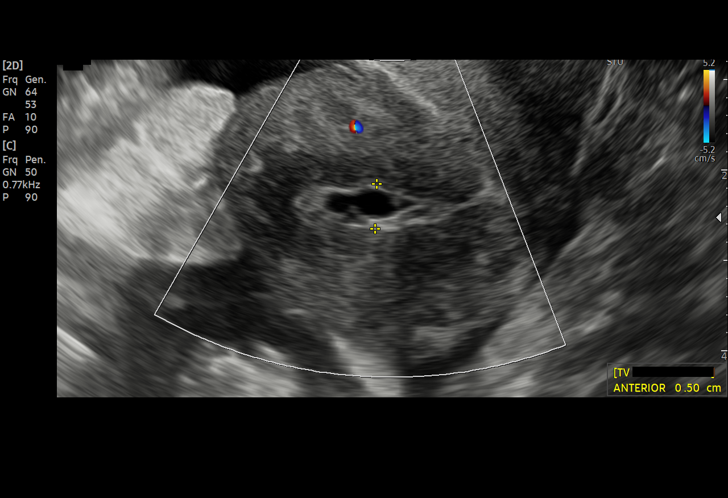
[im 42/67]
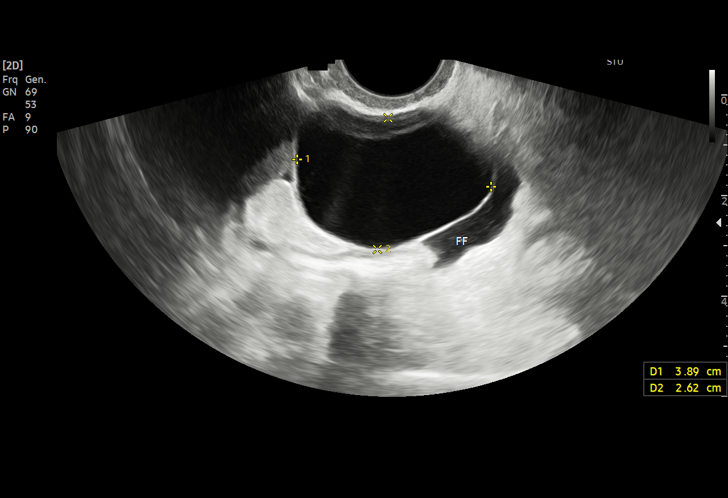
[im 45/67]
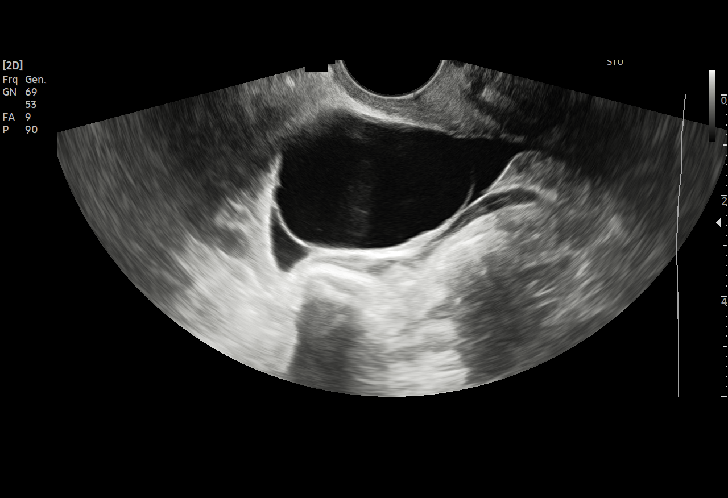
[im 50/67]
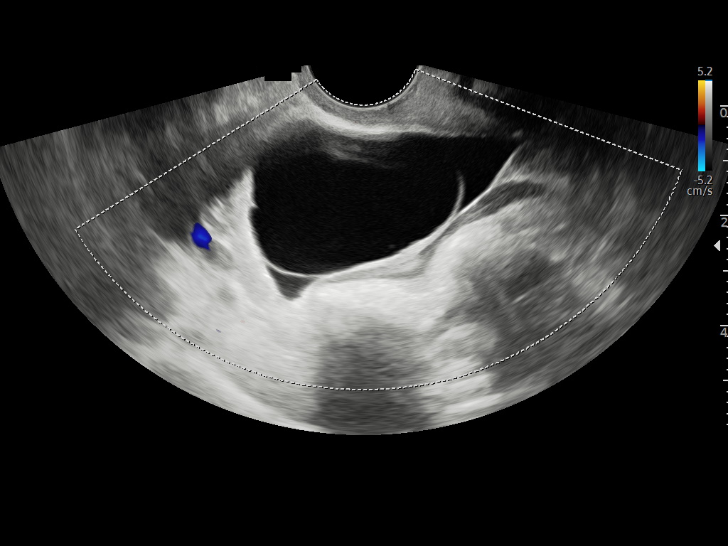
[im 56/67]
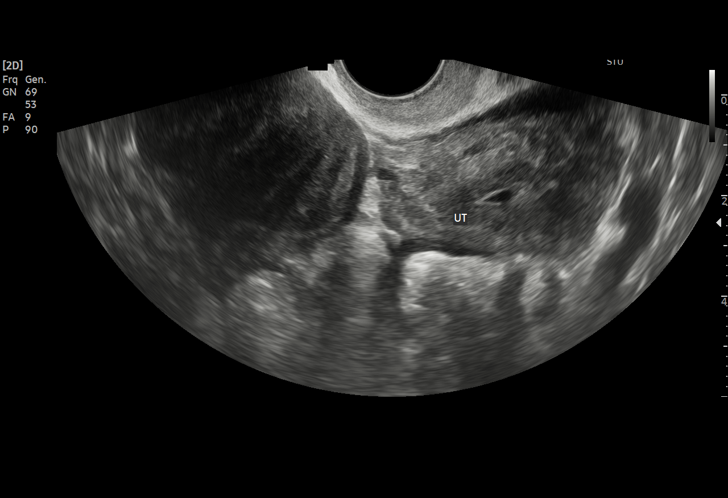
[im 61/67]
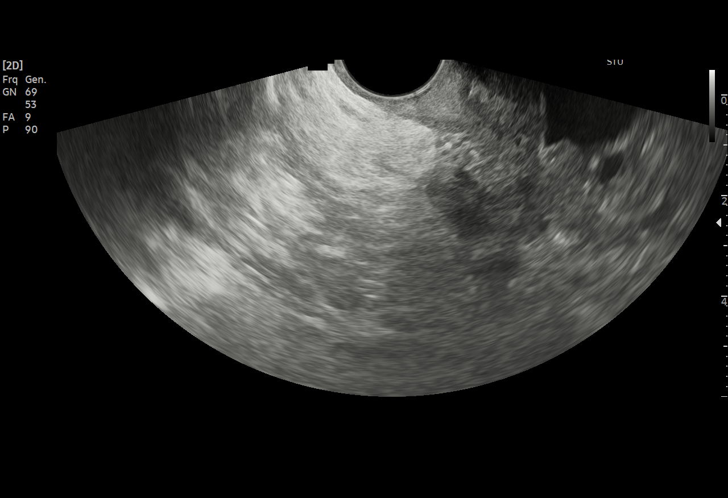
[im 67/67]
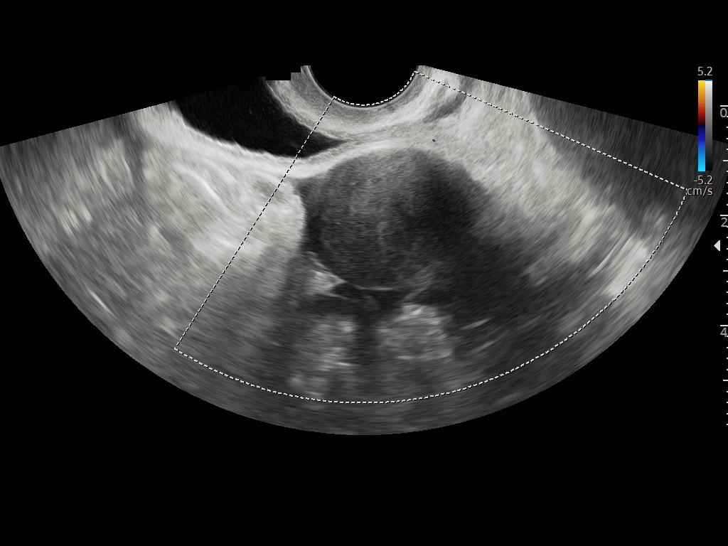

[14 of 25 positions shown; findings below may reference images not displayed]

FINDINGS: Uterus

Measurements: 6.2 x 3.3 x 3.8 cm = volume: 41 mL. Anteverted.
Heterogeneous myometrium. No focal uterine mass.

Endometrium

Thickness: 3 mm. Endometrial fluid and question minimal debris
present.

Right ovary

No normal appearing RIGHT ovary visualized, see below

Left ovary

No normal appearing LEFT ovary visualized, see below

Other findings

Small amount of free pelvic fluid. Cystic mass in RIGHT adnexa 3.9 x
2.6 x 3.8 cm, containing small mural nodules. Solid mass identified
within RIGHT adnexa, 4.4 x 3.9 x 5.3 cm, heterogeneous,
predominantly hypoechoic, with scattered areas of shadowing.
Additional solid mass within LEFT adnexa 4.4 x 3.0 x 3.9 cm,
predominantly hypoechoic, with scattered areas of shadowing.
IMPRESSION: Nonspecific endometrial fluid.

Solid masses in the adnexa bilaterally, 5.3 cm RIGHT and 4.4 cm
LEFT, heterogeneous, predominantly hypoechoic, with scattered areas
of shadowing, question exophytic leiomyomata.

Additional complicated cystic lesion of the RIGHT adnexa, could be
of ovarian or paraovarian origin, 3.9 cm greatest size, containing a
small mural nodules, indeterminate.

Further characterization of these findings by MR imaging with and
without contrast recommended.

These results will be called to the ordering clinician or
representative by the Radiologist Assistant, and communication
documented in the PACS or [REDACTED].

## 2021-11-01 NOTE — Telephone Encounter (Signed)
Patient name and DOB has been verified Patient was informed of lab results. Patient had no questions.  

## 2021-11-01 NOTE — Telephone Encounter (Signed)
-----   Message from Charlott Rakes, MD sent at 11/01/2021  4:13 AM EST ----- Cologuard test to screen for Colon cancer is negative

## 2021-11-01 NOTE — Telephone Encounter (Signed)
Pt returned call but I could not get anyone at the office.

## 2021-11-02 ENCOUNTER — Other Ambulatory Visit: Payer: Self-pay | Admitting: Obstetrics & Gynecology

## 2021-11-02 ENCOUNTER — Telehealth: Payer: Self-pay | Admitting: Cardiovascular Disease

## 2021-11-02 DIAGNOSIS — N838 Other noninflammatory disorders of ovary, fallopian tube and broad ligament: Secondary | ICD-10-CM | POA: Insufficient documentation

## 2021-11-02 NOTE — Telephone Encounter (Signed)
Calling to give call report on complete pelvis ultrasound. Please advise

## 2021-11-02 NOTE — Telephone Encounter (Signed)
Pelvic US also reviewed by her OBGYN Dr. Harolyn Rutherford... MRA already ordered. Will close this encounter.

## 2021-11-02 NOTE — Progress Notes (Signed)
Tumor markers and MR of pelvis ordered for further evaluation/characterization of pelvic masses seen on recent ultrasound.  Patient will be notified of the need for these studies to be done prior to our scheduled appointment on 11/22/21.  Verita Schneiders, MD, [place] for [employer], [place]

## 2021-11-02 NOTE — Telephone Encounter (Signed)
Report received and forwarded to Dr. Burt Knack.   Pt has appt with Dr Harolyn Rutherford OBGYN 11/22/21.

## 2021-11-02 NOTE — Telephone Encounter (Signed)
I spoke with the patient and reviewed the results of her pelvic ultrasound with her.  She understands that the definitive diagnosis has not yet been reached.  Radiology has recommended an MRI with and without contrast to further evaluate her ovarian masses.  I will go ahead and order this study.  The patient has a consultation with gynecology scheduled on December 20.  She reports that she is doing fine from a cardiac perspective and has had no change in her symptoms.

## 2021-11-03 ENCOUNTER — Other Ambulatory Visit: Payer: Medicare PPO

## 2021-11-03 ENCOUNTER — Other Ambulatory Visit: Payer: Self-pay

## 2021-11-03 DIAGNOSIS — N838 Other noninflammatory disorders of ovary, fallopian tube and broad ligament: Secondary | ICD-10-CM | POA: Diagnosis not present

## 2021-11-04 ENCOUNTER — Telehealth: Payer: Self-pay | Admitting: *Deleted

## 2021-11-04 LAB — CA 125: Cancer Antigen (CA) 125: 9.9 U/mL (ref 0.0–38.1)

## 2021-11-04 LAB — BETA HCG QUANT (REF LAB): hCG Quant: 5 m[IU]/mL

## 2021-11-04 LAB — LACTATE DEHYDROGENASE: LDH: 158 IU/L (ref 119–226)

## 2021-11-04 LAB — AFP TUMOR MARKER: AFP, Serum, Tumor Marker: 2.4 ng/mL (ref 0.0–9.2)

## 2021-11-04 LAB — CANCER ANTIGEN 19-9: CA 19-9: 35 U/mL (ref 0–35)

## 2021-11-04 LAB — CEA: CEA: 3.6 ng/mL (ref 0.0–4.7)

## 2021-11-04 NOTE — Telephone Encounter (Signed)
Pt informed of lab results. 

## 2021-11-04 NOTE — Telephone Encounter (Signed)
-----   Message from Osborne Oman, MD sent at 11/04/2021  9:05 AM EST ----- Patient has negative tumor markers which is reassuring.  Will follow up MR results for further characterization of pelvic masses.  Please call to inform patient of results and recommendations.   Verita Schneiders, MD

## 2021-11-07 ENCOUNTER — Telehealth: Payer: Self-pay | Admitting: Family Medicine

## 2021-11-07 NOTE — Telephone Encounter (Signed)
Copied from [place] [id number]. Topic: General - Other >> Nov 07, 2021  3:45 PM Tessa Lerner A wrote: Reason for CRM: The patient would like to be contacted regarding the cologuard results when possible  Please contact further

## 2021-11-07 NOTE — Telephone Encounter (Signed)
Pt was called and informed of results. 

## 2021-11-10 ENCOUNTER — Ambulatory Visit: Payer: Medicare PPO

## 2021-11-17 ENCOUNTER — Other Ambulatory Visit: Payer: Self-pay

## 2021-11-17 ENCOUNTER — Ambulatory Visit
Admission: RE | Admit: 2021-11-17 | Discharge: 2021-11-17 | Disposition: A | Discharge status: Home / Self Care | Payer: Medicare PPO | Source: Ambulatory Visit | Attending: Obstetrics & Gynecology | Admitting: Obstetrics & Gynecology

## 2021-11-17 DIAGNOSIS — N83291 Other ovarian cyst, right side: Secondary | ICD-10-CM | POA: Diagnosis not present

## 2021-11-17 DIAGNOSIS — N838 Other noninflammatory disorders of ovary, fallopian tube and broad ligament: Secondary | ICD-10-CM | POA: Insufficient documentation

## 2021-11-17 DIAGNOSIS — D259 Leiomyoma of uterus, unspecified: Secondary | ICD-10-CM | POA: Diagnosis not present

## 2021-11-17 DIAGNOSIS — K5730 Diverticulosis of large intestine without perforation or abscess without bleeding: Secondary | ICD-10-CM | POA: Diagnosis not present

## 2021-11-17 IMAGING — MR MR PELVIS WO/W CM
15 of 19 series · 34 of 48 positions shown · IV contrast (gadavist)
Comparison: Ultrasound on [DATE], and CTA on [DATE]

CLINICAL DATA: Bilateral adnexal masses.

EXAM:
MRI PELVIS WITHOUT AND WITH CONTRAST
TECHNIQUE: Multiplanar multisequence MR imaging of the pelvis was performed
both before and after administration of intravenous contrast.
CONTRAST:  8mL GADAVIST GADOBUTROL 1 MMOL/ML IV SOLN

[Series 2: T2 · coronal · 5.0mm · 1.41mm/px · 2 of 30 slices shown (1 of 3)]
[im 1/30]
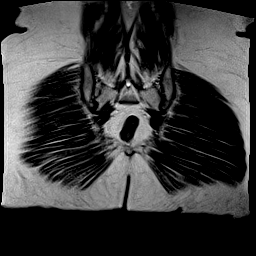
[im 30/30]
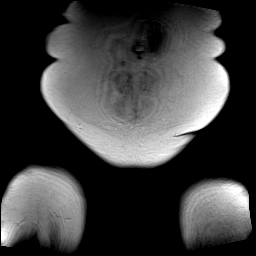

[Series 3: T2 · coronal · 5.0mm · 0.69mm/px · 2 of 31 slices shown (2 of 3)]
[im 1/31]
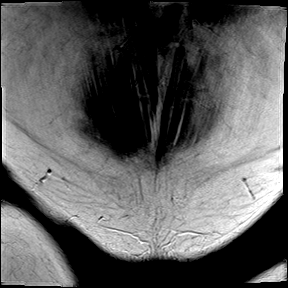
[im 31/31]
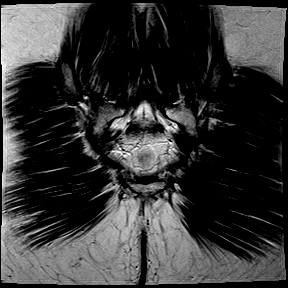

[Series 4: T2 · axial · 5.0mm · 0.75mm/px · 1 of 37 slices shown (3 of 3)]
[im 1/37]
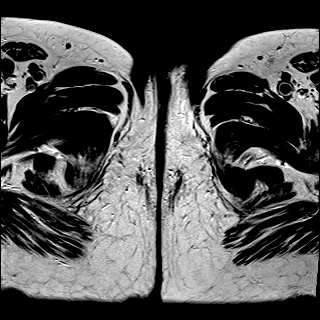

[Series 5: T2 fat-sat · axial · 5.0mm · 0.90mm/px · 1 of 37 slices shown]
[im 1/37]
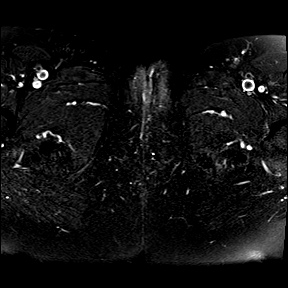

[Series 6: sag tse · sagittal · 5.0mm · 0.75mm/px · 1 of 33 slices shown]
[im 1/33]
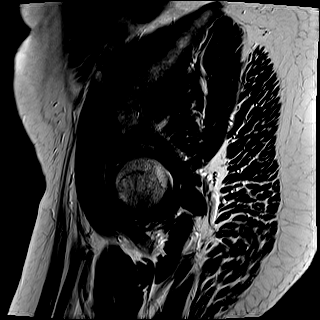

[Series 7: ax dwi_tracew · axial · 6.0mm · 1.14mm/px · z∈[-135,+103]mm · 4 of 102 slices shown]
[im 1/102]
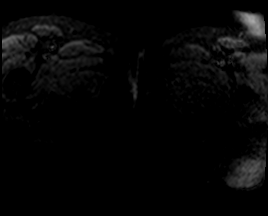
[im 34/102]
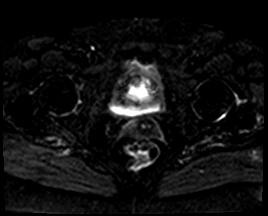
[im 68/102]
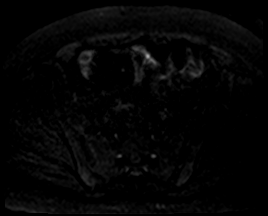
[im 102/102]
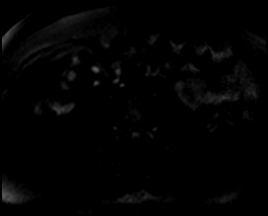

[Series 8: ax dwi_adc · axial · 6.0mm · 1.14mm/px · 1 of 34 slices shown]
[im 1/34]
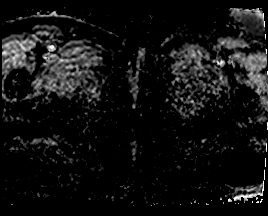

[Series 9: DIXON · axial · 3.0mm · 1.19mm/px · z∈[-123,+90]mm · 3 of 72 slices shown (1 of 2)]
[im 1/72]
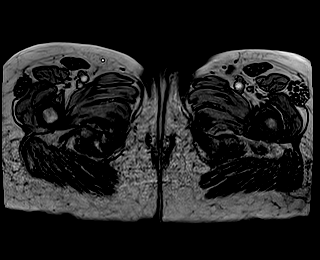
[im 36/72]
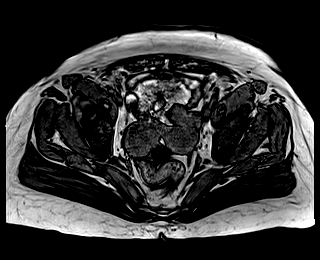
[im 72/72]
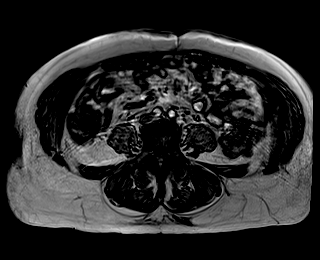

[Series 10: DIXON · axial · 3.0mm · 1.19mm/px · z∈[-123,+90]mm · 3 of 72 slices shown (2 of 2)]
[im 1/72]
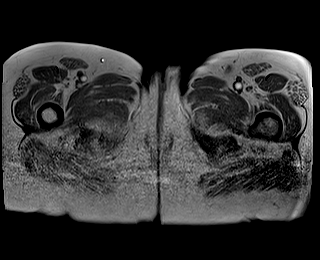
[im 36/72]
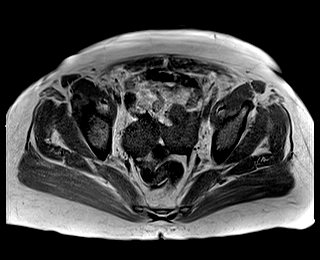
[im 72/72]
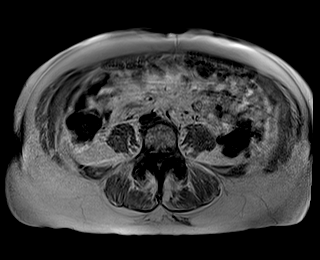

[Series 12: T1 dynamic fat-sat · axial · 3.0mm · 0.47mm/px · z∈[-135,+102]mm · 3 of 80 slices shown]
[im 1/80]
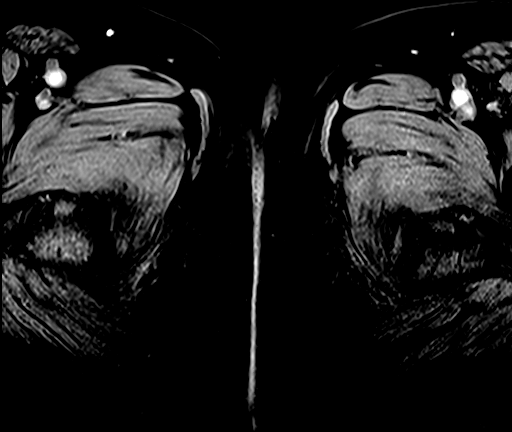
[im 40/80]
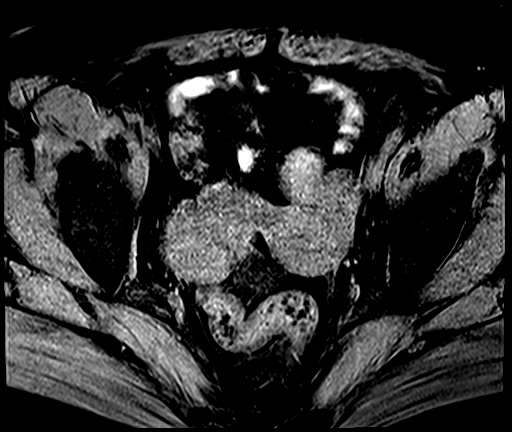
[im 80/80]
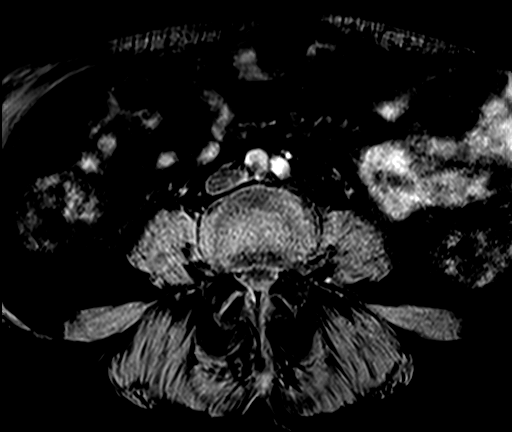

[Series 14: T1 dynamic post-contrast · axial · 3.0mm · 0.47mm/px · z∈[-135,+102]mm · 3 of 80 slices shown (1 of 3)]
[im 1/80]
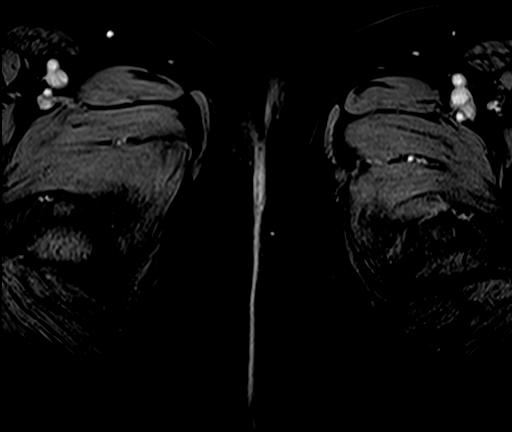
[im 40/80]
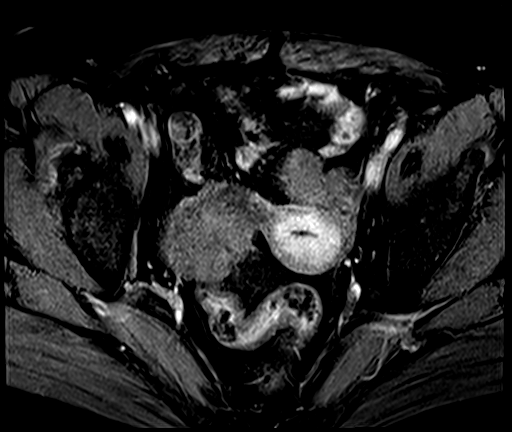
[im 80/80]
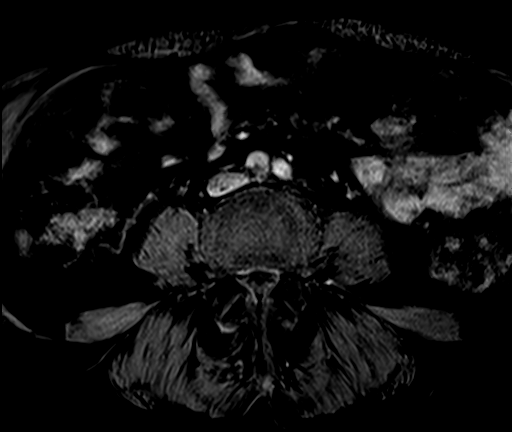

[Series 16: T1 dynamic post-contrast · axial · 3.0mm · 0.47mm/px · z∈[-135,+102]mm · 3 of 80 slices shown (2 of 3)]
[im 1/80]
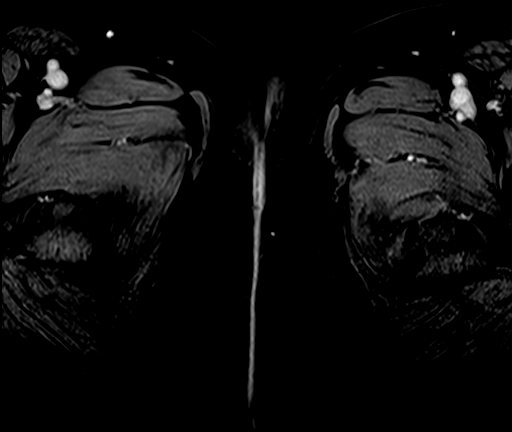
[im 40/80]
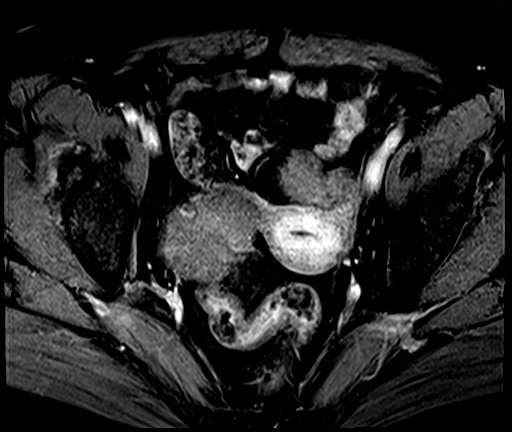
[im 80/80]
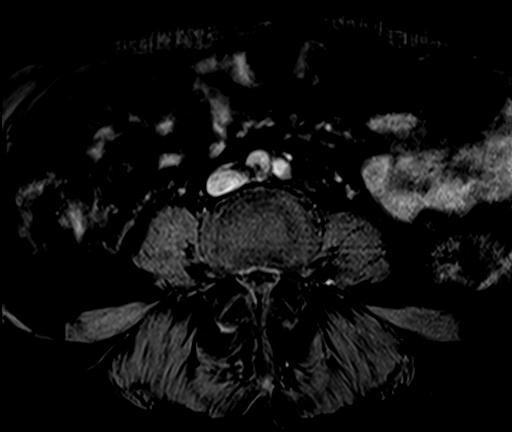

[Series 18: T1 dynamic post-contrast · axial · 3.0mm · 0.47mm/px · z∈[-135,+102]mm · 3 of 80 slices shown (3 of 3)]
[im 1/80]
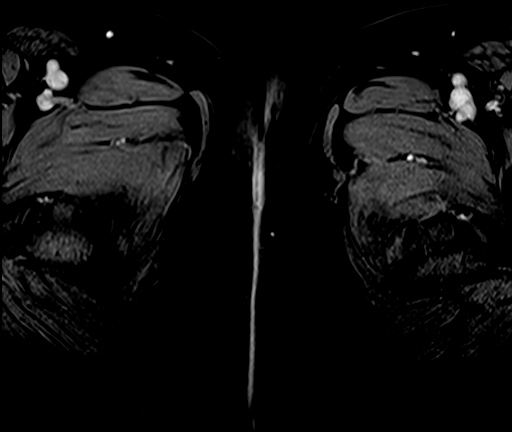
[im 40/80]
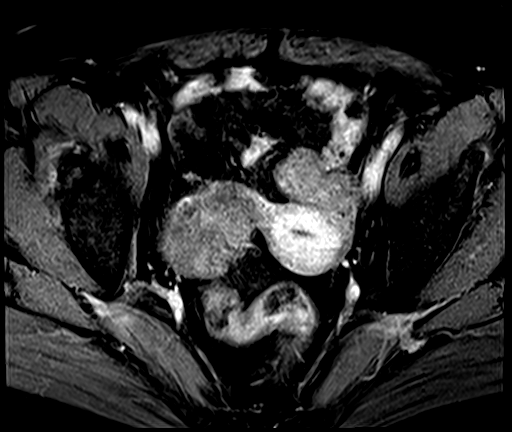
[im 80/80]
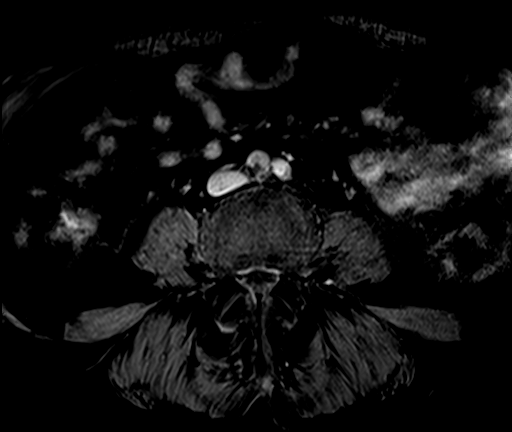

[Series 19: T1 fat-sat post-contrast · axial · 4.0mm · 1.19mm/px · z∈[-144,+111]mm · 2 of 52 slices shown]
[im 1/52]
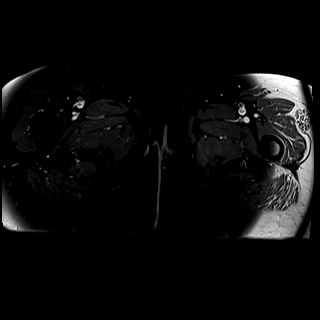
[im 52/52]
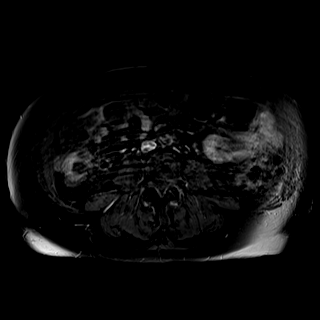

[Series 20: T1 dynamic fat-sat post-contrast · sagittal · 3.0mm · 0.47mm/px · 2 of 160 slices shown]
[im 1/160]
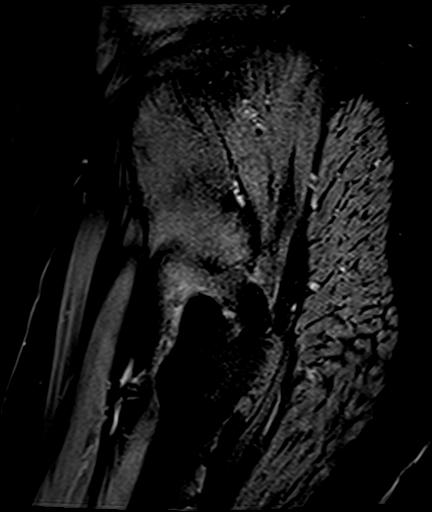
[im 32/160]
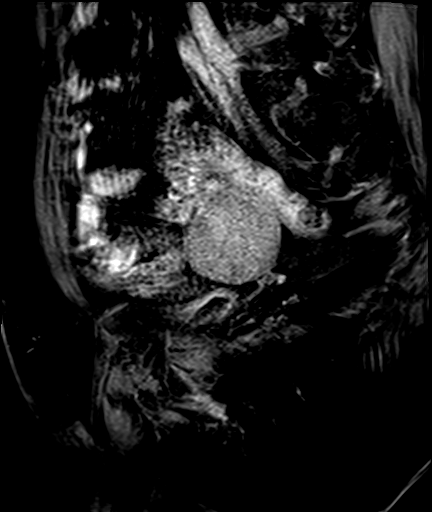

[34 of 48 positions shown; findings below may reference images not displayed]

FINDINGS: Lower Urinary Tract: No urinary bladder or urethral abnormality
identified.

Bowel: Sigmoid diverticulosis is seen, however there is no evidence
of diverticulitis.

Vascular/Lymphatic: Unremarkable. No pathologically enlarged pelvic
lymph nodes identified.

Reproductive:

-- Uterus: Measures 6.4 x 3.2 by 4.1 cm (volume = 44 cm^3). A T2
hypointense solid mass is seen in the right adnexa with a soft
tissue pedicle arising from the right uterine fundus. This measures
5.3 x 4.4 cm and is consistent with a pedunculated fibroid. A 2nd
pedunculated fibroid is seen arising from the left anterior uterine
fundus which measures 4.5 x 2.9 cm. No abnormal endometrial
thickening. Normal appearance of cervix and vagina.

-- Right ovary: A cystic lesion is seen in the right posterior
adnexal region measuring 4.2 x 3.3 cm, likely arising from the right
ovary. No definite complex features are demonstrated, however
visualization is limited by small amount of surrounding free fluid.
Tiny mural nodular densities are better visualized on recent
ultrasound, and raise suspicion for a cystic ovarian neoplasm.

-- Left ovary: Appears normal. No ovarian or adnexal masses
identified.

Other: Small amount of free fluid noted.

Musculoskeletal:  Unremarkable.
IMPRESSION: Pedunculated uterine fibroids in both adnexal regions, measuring
cm on the right, and 4.5 cm on the left.

4.2 cm cystic lesion in right posterior adnexa. No definite complex
features are visualized, however visualization is limited by
adjacent free fluid, and tiny mural nodular densities are better
demonstrated on recent ultrasound. Cystic ovarian neoplasm cannot be
excluded.

Sigmoid diverticulosis, however there is no evidence of
diverticulitis.

## 2021-11-17 MED ORDER — GADOBUTROL 1 MMOL/ML IV SOLN
8.0000 mL | Freq: Once | INTRAVENOUS | Status: AC | PRN
  Administered 2021-11-17: 8 mL via INTRAVENOUS

## 2021-11-22 ENCOUNTER — Encounter: Payer: Self-pay | Admitting: Obstetrics & Gynecology

## 2021-11-22 ENCOUNTER — Ambulatory Visit: Payer: Medicare PPO | Admitting: Obstetrics & Gynecology

## 2021-11-22 ENCOUNTER — Other Ambulatory Visit: Payer: Self-pay

## 2021-11-22 ENCOUNTER — Telehealth: Payer: Self-pay | Admitting: Physician Assistant

## 2021-11-22 ENCOUNTER — Other Ambulatory Visit: Payer: Self-pay | Admitting: Physician Assistant

## 2021-11-22 VITALS — BP 99/65 | HR 54 | Wt 184.0 lb

## 2021-11-22 DIAGNOSIS — I350 Nonrheumatic aortic (valve) stenosis: Secondary | ICD-10-CM

## 2021-11-22 DIAGNOSIS — N838 Other noninflammatory disorders of ovary, fallopian tube and broad ligament: Secondary | ICD-10-CM | POA: Diagnosis not present

## 2021-11-22 NOTE — Progress Notes (Signed)
Patient here to F/U on recent imaging studies.

## 2021-11-22 NOTE — Progress Notes (Addendum)
GYNECOLOGY OFFICE VISIT NOTE  History:   Tracy James is a 70 y.o. PMP female with multiple cardiac issues here today for discussion about management of incidentally noted right ovarian complex mass.  This was initially noted on 09/26/2021 CT Angio of abdomen and pelvis done as a preprocedural study prior to potential transcatheter aortic valve replacement (TAVR) procedure. Pelvic ultrasound done 11/01/21  was equivocal and she had tumor markers drawn that were all negative. Follow up MRI on 11/17/21 showed fibroids, and 4 cm right ovarian complex cystic lesion.   She denies any abnormal vaginal discharge, bleeding, adbominal, pelvic pain or other concerns.    Past Medical History:  Diagnosis Date   Aortic stenosis    Echo 06/2019: mod AS, mean 28 mmHg, peak velocity 3.6 m/s // Echo 9/21: EF 60-65 no RWMA, moderate LVH, normal RVSF, trivial MR, trivial AI, moderate aortic stenosis (mean gradient 36 mmHg; V-max 3.64 m/s; DI 0.18) [reviewed with Dr. Burt Knack - mod AS; rpt echo 1 yr]     CKD (chronic kidney disease)    Hypertension    Nonischemic cardiomyopathy    Tachy induced CM // TEE 12/2018: EF 30-35 // Echo 06/2019: EF 55-60 // Cor CTA 8/18: Ca score 0; no CAD   Persistent atrial fibrillation    Admx 12/19 w AF w RVR // R+L atrial thrombus on TEE - DCCV canceled; repeat TEE in 01/2019 w some residual clot // Dofetilide Rx started 07/2019 >> NSR restored without DCCV // CHADS-VASc 4 (6 if include LAA clot) >> Apixaban     Past Surgical History:  Procedure Laterality Date   TEE WITHOUT CARDIOVERSION N/A 12/06/2018   Procedure: TRANSESOPHAGEAL ECHOCARDIOGRAM (TEE);  Surgeon: Lelon Perla, MD;  Location: [facility] ENDOSCOPY;  Service: Cardiovascular;  Laterality: N/A;   TEE WITHOUT CARDIOVERSION N/A 01/20/2019   Procedure: TRANSESOPHAGEAL ECHOCARDIOGRAM (TEE);  Surgeon: [name], MD;  Location: [facility] ENDOSCOPY;  Service: Cardiovascular;  Laterality: N/A;   TONSILLECTOMY      The following  portions of the patient's history were reviewed and updated as appropriate: allergies, current medications, past family history, past medical history, past social history, past surgical history and problem list.   Health Maintenance:  Normal pap on 10/04/2018.  Normal mammogram on 01/14/2021.   Review of Systems:  Pertinent items noted in HPI and remainder of comprehensive ROS otherwise negative.  Physical Exam:  BP 99/65    Pulse (!) 54    Wt 184 lb (83.5 kg)    BMI 29.70 kg/m  CONSTITUTIONAL: Well-developed, well-nourished female in no acute distress.  HEENT:  Normocephalic, atraumatic. External right and left ear normal. No scleral icterus.  NECK: Normal range of motion, supple, no masses noted on observation SKIN: No rash noted. Not diaphoretic. No erythema. No pallor. MUSCULOSKELETAL: Normal range of motion. No edema noted. NEUROLOGIC: Alert and oriented to person, place, and time. Normal muscle tone coordination. No cranial nerve deficit noted. PSYCHIATRIC: Normal mood and affect. Normal behavior. Normal judgment and thought content. CARDIOVASCULAR: Normal heart rate noted RESPIRATORY: Effort and breath sounds normal, no problems with respiration noted ABDOMEN: No abdominal tenderness on palpation. No masses noted. No other overt distention noted.   PELVIC: Deferred  Labs and Imaging  Latest Reference Range & Units 11/03/21 13:27  AFP, Serum, Tumor Marker 0.0 - 9.2 ng/mL 2.4  CA 19-9 0 - 35 U/mL 35  Cancer Antigen (CA) 125 0.0 - 38.1 U/mL 9.9  CEA 0.0 - 4.7 ng/mL 3.6  hCG  Quant mIU/mL 5    MR PELVIS W WO CONTRAST  Result Date: 11/18/2021 CLINICAL DATA:  Bilateral adnexal masses. EXAM: MRI PELVIS WITHOUT AND WITH CONTRAST TECHNIQUE: Multiplanar multisequence MR imaging of the pelvis was performed both before and after administration of intravenous contrast. CONTRAST:  67mL GADAVIST GADOBUTROL 1 MMOL/ML IV SOLN COMPARISON:  Ultrasound on 11/01/2021, and CTA on 09/26/2021 FINDINGS:  Lower Urinary Tract: No urinary bladder or urethral abnormality identified. Bowel: Sigmoid diverticulosis is seen, however there is no evidence of diverticulitis. Vascular/Lymphatic: Unremarkable. No pathologically enlarged pelvic lymph nodes identified. Reproductive: -- Uterus: Measures 6.4 x 3.2 by 4.1 cm (volume = 44 cm^3). A T2 hypointense solid mass is seen in the right adnexa with a soft tissue pedicle arising from the right uterine fundus. This measures 5.3 x 4.4 cm and is consistent with a pedunculated fibroid. A 2nd pedunculated fibroid is seen arising from the left anterior uterine fundus which measures 4.5 x 2.9 cm. No abnormal endometrial thickening. Normal appearance of cervix and vagina. -- Right ovary: A cystic lesion is seen in the right posterior adnexal region measuring 4.2 x 3.3 cm, likely arising from the right ovary. No definite complex features are demonstrated, however visualization is limited by small amount of surrounding free fluid. Tiny mural nodular densities are better visualized on recent ultrasound, and raise suspicion for a cystic ovarian neoplasm. -- Left ovary: Appears normal. No ovarian or adnexal masses identified. Other: Small amount of free fluid noted. Musculoskeletal:  Unremarkable. IMPRESSION: Pedunculated uterine fibroids in both adnexal regions, measuring 5.3 cm on the right, and 4.5 cm on the left. 4.2 cm cystic lesion in right posterior adnexa. No definite complex features are visualized, however visualization is limited by adjacent free fluid, and tiny mural nodular densities are better demonstrated on recent ultrasound. Cystic ovarian neoplasm cannot be excluded. Sigmoid diverticulosis, however there is no evidence of diverticulitis. Electronically Signed   By: Marlaine Hind M.D.   On: 11/18/2021 18:58   US PELVIC COMPLETE WITH TRANSVAGINAL  Result Date: 11/01/2021 CLINICAL DATA:  Ovarian mass seen on CT; postmenopausal EXAM: TRANSABDOMINAL AND TRANSVAGINAL  ULTRASOUND OF PELVIS TECHNIQUE: Both transabdominal and transvaginal ultrasound examinations of the pelvis were performed. Transabdominal technique was performed for global imaging of the pelvis including uterus, ovaries, adnexal regions, and pelvic cul-de-sac. It was necessary to proceed with endovaginal exam following the transabdominal exam to visualize the uterus, endometrium, and ovaries. COMPARISON:  CT angio chest abdomen pelvis 09/26/2021 FINDINGS: Uterus Measurements: 6.2 x 3.3 x 3.8 cm = volume: 41 mL. Anteverted. Heterogeneous myometrium. No focal uterine mass. Endometrium Thickness: 3 mm. Endometrial fluid and question minimal debris present. Right ovary No normal appearing RIGHT ovary visualized, see below Left ovary No normal appearing LEFT ovary visualized, see below Other findings Small amount of free pelvic fluid. Cystic mass in RIGHT adnexa 3.9 x 2.6 x 3.8 cm, containing small mural nodules. Solid mass identified within RIGHT adnexa, 4.4 x 3.9 x 5.3 cm, heterogeneous, predominantly hypoechoic, with scattered areas of shadowing. Additional solid mass within LEFT adnexa 4.4 x 3.0 x 3.9 cm, predominantly hypoechoic, with scattered areas of shadowing. IMPRESSION: Nonspecific endometrial fluid. Solid masses in the adnexa bilaterally, 5.3 cm RIGHT and 4.4 cm LEFT, heterogeneous, predominantly hypoechoic, with scattered areas of shadowing, question exophytic leiomyomata. Additional complicated cystic lesion of the RIGHT adnexa, could be of ovarian or paraovarian origin, 3.9 cm greatest size, containing a small mural nodules, indeterminate. Further characterization of these findings by MR imaging with and without  contrast recommended. These results will be called to the ordering clinician or representative by the Radiologist Assistant, and communication documented in the PACS or [employer]. Electronically Signed   By: Lavonia Dana M.D.   On: 11/01/2021 17:24      Assessment and Plan:     1. Ovarian  mass Discussed labs and imaging with patient, reassured by negative tumor markers. Prior to appointment, I had discussed management with Dr. Jeral Pinch (GYN Oncology).  Given no symptoms and negative marker, will follow up with MRI in 6 months to continue to evaluate mass for possible worsening characteristics. No surgical intervention is needed at this point.  Discussed this with patient, she agreed with plan. Patient can proceed with TAVR as planned. - MR PELVIS W WO CONTRAST; Future Routine preventative health maintenance measures emphasized. Please refer to After Visit Summary for other counseling recommendations.   Return in about 6 months (around 05/23/2022) for discussion of results after MRI pelvis.      Verita Schneiders, MD, [place] for [employer], [place]

## 2021-11-22 NOTE — Telephone Encounter (Signed)
°  [facility]   MULTIDISCIPLINARY HEART VALVE TEAM  Patient is followed by Dr. Burt Knack for moderate to severe AS. CT scans were ordered which revealed an incidental ovarian mass. Pt referred to OBGYN who she saw today and recommended 6 month follow up given negative markers and reassuring follow up imaging.   Dr. Burt Knack recommended continued clinical surveillance of her AS. Her last echo was completed in 08/2021. I have arranged for 6 month follow up echo and OV with Dr. Burt Knack on 02/23/22. She understands that she should call the office for a sooner appt if she develops any change in clinical status (she is feeling great currently).    Angelena Form PA-C  [facility]

## 2021-11-22 NOTE — Addendum Note (Signed)
Addended by: Harland German A on: 11/22/2021 12:37 PM   Modules accepted: Orders

## 2021-12-06 ENCOUNTER — Other Ambulatory Visit: Payer: Self-pay | Admitting: Family Medicine

## 2021-12-06 DIAGNOSIS — Z1231 Encounter for screening mammogram for malignant neoplasm of breast: Secondary | ICD-10-CM

## 2021-12-18 ENCOUNTER — Other Ambulatory Visit: Payer: Self-pay | Admitting: Internal Medicine

## 2021-12-18 NOTE — Telephone Encounter (Signed)
Requested Prescriptions  Pending Prescriptions Disp Refills   hydrALAZINE (APRESOLINE) 50 MG tablet [Pharmacy Med Name: hydrALAZINE HCl 50 MG Oral Tablet] 270 tablet 1    Sig: TAKE 1 TABLET BY MOUTH THREE TIMES DAILY     Cardiovascular:  Vasodilators Passed - 12/18/2021 10:25 AM      Passed - HCT in normal range and within 360 days    HCT  Date Value Ref Range Status  07/15/2021 40.2 36.0 - 46.0 % Final   Hematocrit  Date Value Ref Range Status  11/07/2019 38.9 34.0 - 46.6 % Final         Passed - HGB in normal range and within 360 days    Hemoglobin  Date Value Ref Range Status  07/15/2021 13.1 12.0 - 15.0 g/dL Final  11/07/2019 13.1 11.1 - 15.9 g/dL Final         Passed - RBC in normal range and within 360 days    RBC  Date Value Ref Range Status  07/15/2021 4.51 3.87 - 5.11 MIL/uL Final         Passed - WBC in normal range and within 360 days    WBC  Date Value Ref Range Status  07/15/2021 8.9 4.0 - 10.5 K/uL Final         Passed - PLT in normal range and within 360 days    Platelets  Date Value Ref Range Status  07/15/2021 301 150 - 400 K/uL Final  11/07/2019 262 150 - 450 x10E3/uL Final         Passed - Last BP in normal range    BP Readings from Last 1 Encounters:  11/22/21 99/65         Passed - Valid encounter within last 12 months    Recent Outpatient Visits          2 months ago Encounter for [employer] annual wellness exam   [place], Charlane Ferretti, MD   8 months ago Allergic conjunctivitis of both eyes   [place], Charlane Ferretti, MD   1 year ago Encounter for [employer] annual wellness exam   [place], Darrick Penna, MD   1 year ago DCM (dilated cardiomyopathy) [facility])   [place], Darrick Penna, MD   2 years ago Screening for colon cancer   [place], MD      Future  Appointments            In 2 months Sherren Mocha, MD [place], [facility]

## 2022-01-02 DIAGNOSIS — K08409 Partial loss of teeth, unspecified cause, unspecified class: Secondary | ICD-10-CM | POA: Diagnosis not present

## 2022-01-02 DIAGNOSIS — E785 Hyperlipidemia, unspecified: Secondary | ICD-10-CM | POA: Diagnosis not present

## 2022-01-02 DIAGNOSIS — E663 Overweight: Secondary | ICD-10-CM | POA: Diagnosis not present

## 2022-01-02 DIAGNOSIS — H269 Unspecified cataract: Secondary | ICD-10-CM | POA: Diagnosis not present

## 2022-01-02 DIAGNOSIS — I110 Hypertensive heart disease with heart failure: Secondary | ICD-10-CM | POA: Diagnosis not present

## 2022-01-02 DIAGNOSIS — R32 Unspecified urinary incontinence: Secondary | ICD-10-CM | POA: Diagnosis not present

## 2022-01-02 DIAGNOSIS — D6869 Other thrombophilia: Secondary | ICD-10-CM | POA: Diagnosis not present

## 2022-01-02 DIAGNOSIS — I4891 Unspecified atrial fibrillation: Secondary | ICD-10-CM | POA: Diagnosis not present

## 2022-01-02 DIAGNOSIS — I509 Heart failure, unspecified: Secondary | ICD-10-CM | POA: Diagnosis not present

## 2022-01-16 ENCOUNTER — Ambulatory Visit: Payer: Medicare PPO

## 2022-01-17 ENCOUNTER — Other Ambulatory Visit: Payer: Self-pay

## 2022-01-17 ENCOUNTER — Ambulatory Visit
Admission: RE | Admit: 2022-01-17 | Discharge: 2022-01-17 | Disposition: A | Discharge status: Home / Self Care | Payer: Medicare PPO | Source: Ambulatory Visit | Attending: Family Medicine | Admitting: Family Medicine

## 2022-01-17 DIAGNOSIS — Z1231 Encounter for screening mammogram for malignant neoplasm of breast: Secondary | ICD-10-CM

## 2022-01-17 IMAGING — MG MM DIGITAL SCREENING BILAT W/ TOMO AND CAD
8 series · 8 of 24 positions shown · non-contrast
Comparison: Previous exam(s).

CLINICAL DATA: Screening.

EXAM:
DIGITAL SCREENING BILATERAL MAMMOGRAM WITH TOMOSYNTHESIS AND CAD
TECHNIQUE: Bilateral screening digital craniocaudal and mediolateral oblique
mammograms were obtained. Bilateral screening digital breast
tomosynthesis was performed. The images were evaluated with
computer-aided detection.

[L MLO synth-2D]
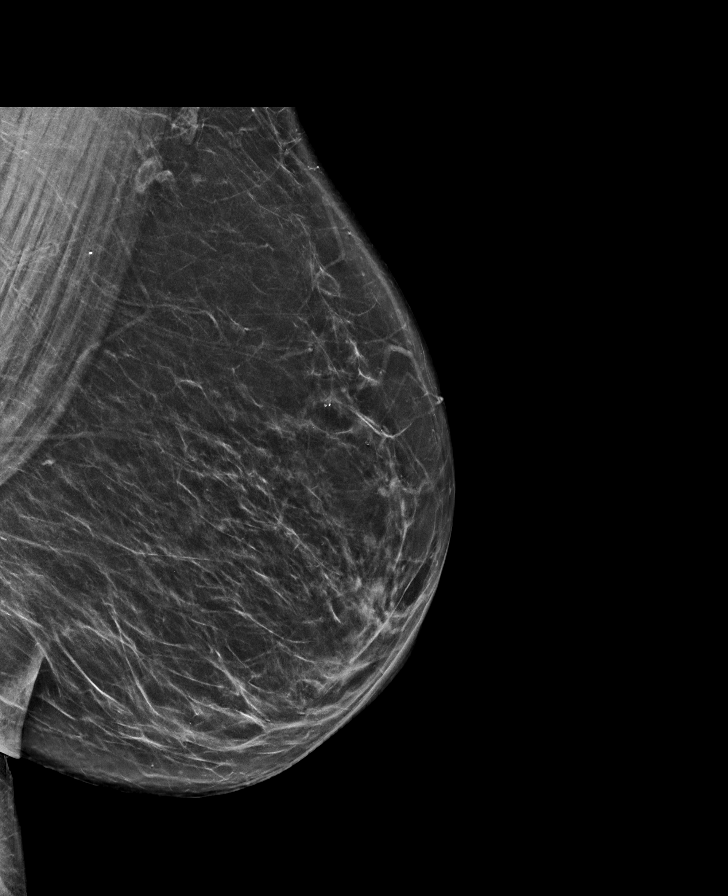

[R MLO synth-2D]
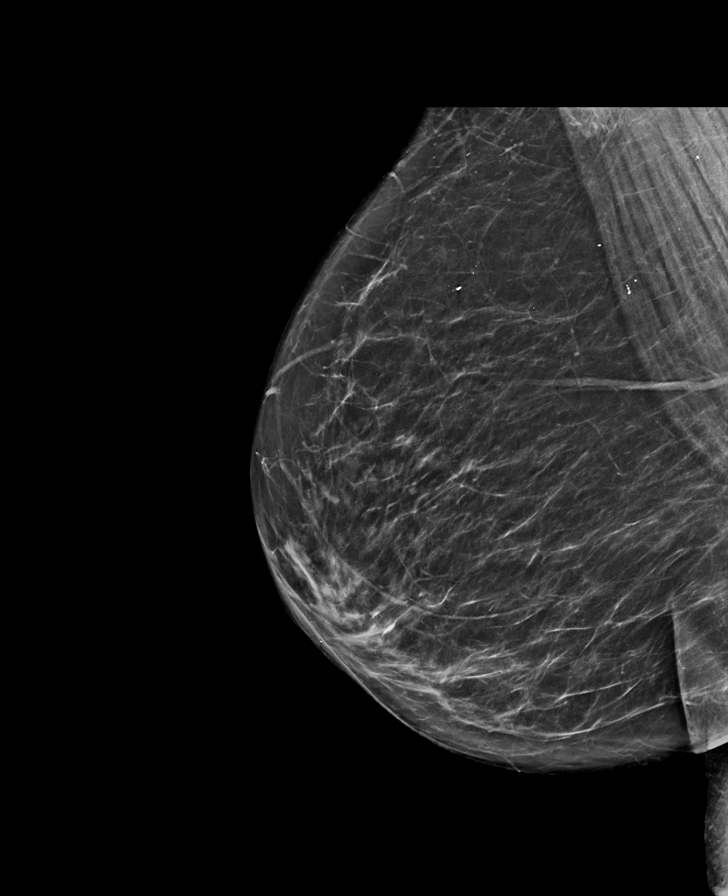

[R CC synth-2D]
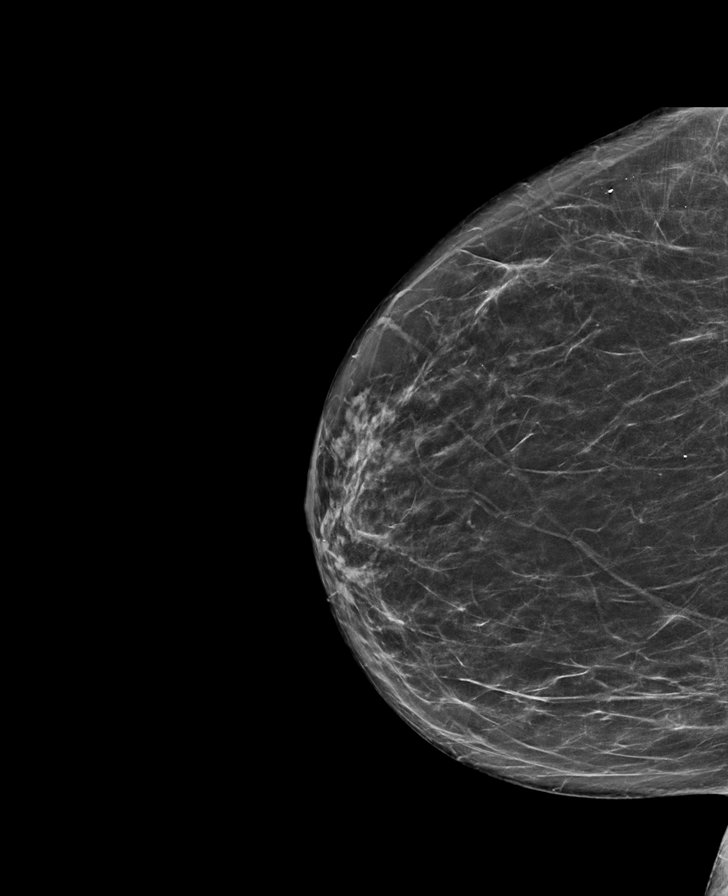

[L CC synth-2D]
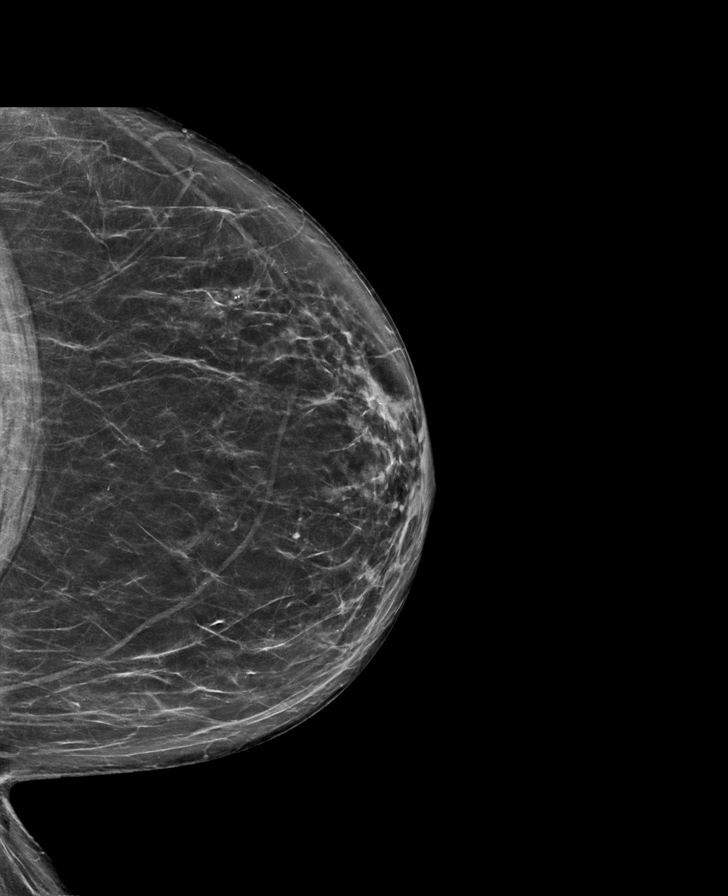

[L MLO tomo · tomo slice 39/76.0]
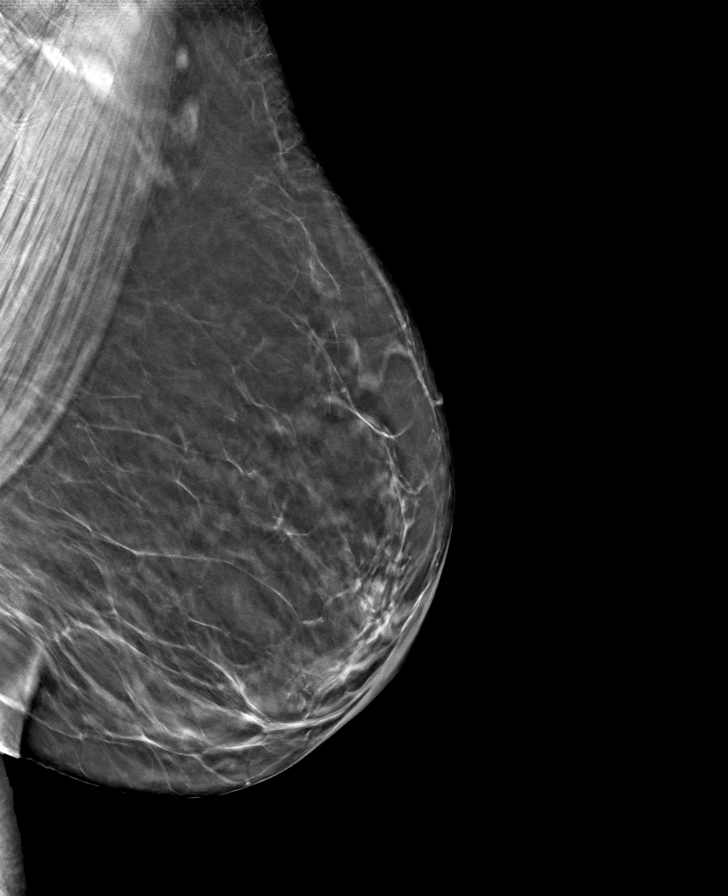

[R MLO tomo · tomo slice 38/75.0]
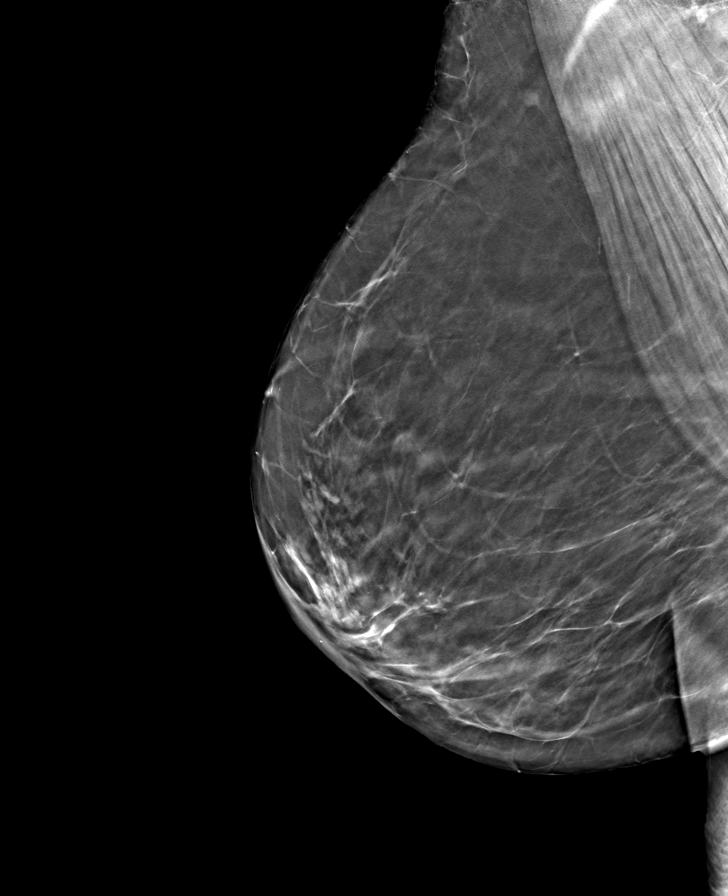

[R CC tomo · tomo slice 37/74.0]
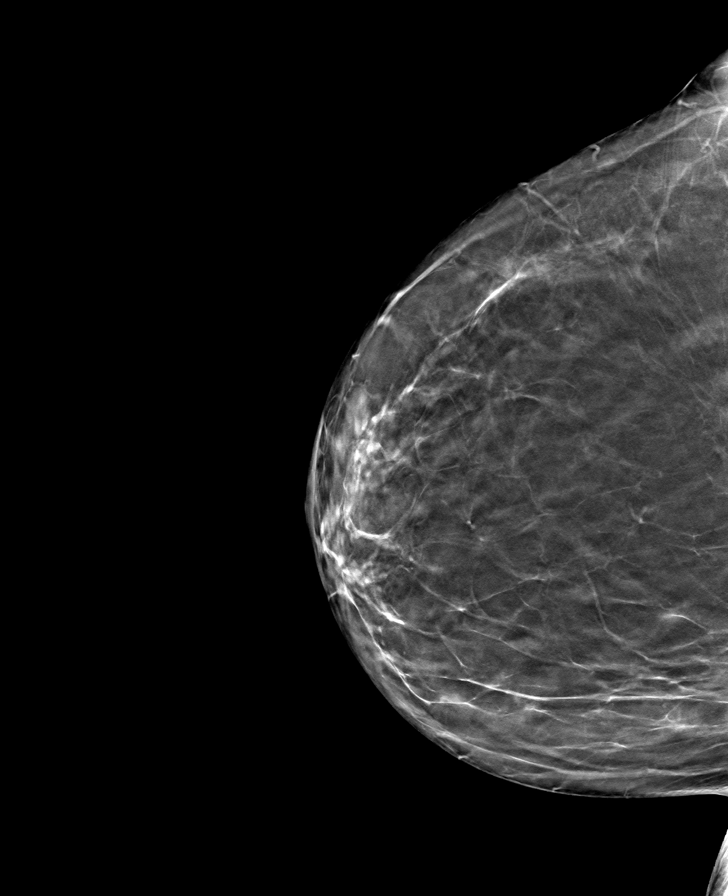

[L CC tomo · tomo slice 39/77.0]
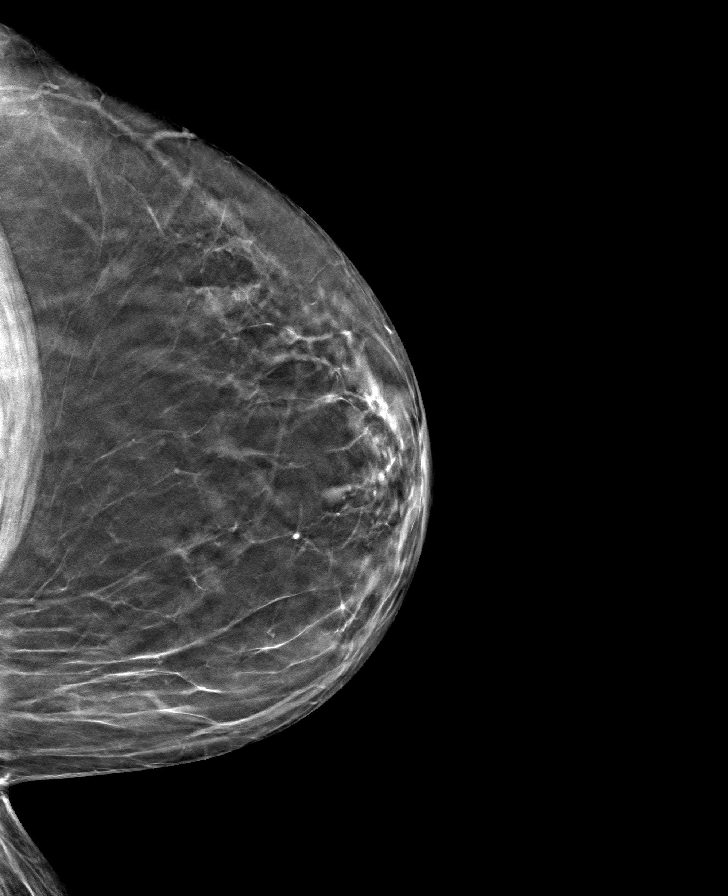

[8 of 24 positions shown; findings below may reference images not displayed]

ACR Breast Density Category b: There are scattered areas of
fibroglandular density.
FINDINGS: There are no findings suspicious for malignancy.
IMPRESSION: No mammographic evidence of malignancy. A result letter of this
screening mammogram will be mailed directly to the patient.

RECOMMENDATION:
Screening mammogram in one year. (Code:[BY])

BI-RADS CATEGORY  1: Negative.

## 2022-01-19 ENCOUNTER — Telehealth: Payer: Self-pay

## 2022-01-19 NOTE — Telephone Encounter (Signed)
Pt was called and a VM was left informing patient of lab results. 

## 2022-01-19 NOTE — Telephone Encounter (Signed)
-----   Message from Charlott Rakes, MD sent at 01/17/2022  1:32 PM EST ----- Please inform her that her mammogram is normal

## 2022-01-20 ENCOUNTER — Other Ambulatory Visit (HOSPITAL_COMMUNITY): Payer: Self-pay | Admitting: *Deleted

## 2022-01-20 ENCOUNTER — Encounter (HOSPITAL_COMMUNITY): Payer: Self-pay | Admitting: Physician Assistant

## 2022-01-20 ENCOUNTER — Other Ambulatory Visit: Payer: Self-pay

## 2022-01-20 ENCOUNTER — Ambulatory Visit (HOSPITAL_COMMUNITY)
Admission: RE | Admit: 2022-01-20 | Discharge: 2022-01-20 | Disposition: A | Discharge status: Home / Self Care | Payer: Medicare PPO | Source: Ambulatory Visit | Attending: Physician Assistant | Admitting: Physician Assistant

## 2022-01-20 VITALS — BP 104/68 | HR 47 | Ht 66.0 in | Wt 182.6 lb

## 2022-01-20 DIAGNOSIS — Z7901 Long term (current) use of anticoagulants: Secondary | ICD-10-CM | POA: Diagnosis not present

## 2022-01-20 DIAGNOSIS — I350 Nonrheumatic aortic (valve) stenosis: Secondary | ICD-10-CM | POA: Insufficient documentation

## 2022-01-20 DIAGNOSIS — I4819 Other persistent atrial fibrillation: Secondary | ICD-10-CM | POA: Insufficient documentation

## 2022-01-20 DIAGNOSIS — Z87891 Personal history of nicotine dependence: Secondary | ICD-10-CM | POA: Diagnosis not present

## 2022-01-20 DIAGNOSIS — D6869 Other thrombophilia: Secondary | ICD-10-CM | POA: Insufficient documentation

## 2022-01-20 DIAGNOSIS — I10 Essential (primary) hypertension: Secondary | ICD-10-CM | POA: Diagnosis not present

## 2022-01-20 DIAGNOSIS — I429 Cardiomyopathy, unspecified: Secondary | ICD-10-CM | POA: Insufficient documentation

## 2022-01-20 DIAGNOSIS — I480 Paroxysmal atrial fibrillation: Secondary | ICD-10-CM

## 2022-01-20 LAB — BASIC METABOLIC PANEL
Anion gap: 8 (ref 5–15)
BUN: 30 mg/dL — ABNORMAL HIGH (ref 8–23)
CO2: 25 mmol/L (ref 22–32)
Calcium: 9.5 mg/dL (ref 8.9–10.3)
Chloride: 107 mmol/L (ref 98–111)
Creatinine, Ser: 1.58 mg/dL — ABNORMAL HIGH (ref 0.44–1.00)
GFR, Estimated: 35 mL/min — ABNORMAL LOW (ref >60)
Glucose, Bld: 102 mg/dL — ABNORMAL HIGH (ref 70–99)
Potassium: 5.4 mmol/L — ABNORMAL HIGH (ref 3.5–5.1)
Sodium: 140 mmol/L (ref 135–145)

## 2022-01-20 LAB — MAGNESIUM: Magnesium: 2.1 mg/dL (ref 1.7–2.4)

## 2022-01-20 MED ORDER — DILTIAZEM HCL ER COATED BEADS 120 MG PO CP24: 120.0000 mg | ORAL_CAPSULE | Freq: Every day | ORAL | 3 refills | Status: DC

## 2022-01-20 NOTE — Progress Notes (Signed)
Primary Care Physician: Charlott Rakes, MD Primary Cardiologist: Dr Burt Knack Primary Electrophysiologist: Dr Rayann Heman Referring Physician: Richardson Dopp PA-C   Tracy James is a 71 y.o. female with a history of persistent atrial fibrillation, tachycardia induced cardiomyopathy, AS, HTN, and tobacco abuse who presents for follow up in the [facility]. She was admitted in 11/2018 with AF with RVR.  EF was 40-45 on Echo and TEE demonstrated thrombus in the R and L atria and possible thrombus adjacent to the heart.  After a month of anticoagulation, she underwent repeat TEE-DCCV 01/20/2019.  Unfortunately, the TEE continued to show residual clot in the LA.  Therefore, DCCV was not performed.  She was seen in follow up on 02/2019 and her HR was uncontrolled.  Unfortunately, repeat DCCV was not arranged due to COVID-19 restrictions (elective procedures on hold).  She was placed on Digoxin for rate control. She was admitted 07/15/19-07/18/19 for dofetilide loading. She is on Eliquis for a CHADS2VASC score of 4.   On follow up today, patient reports that she has done reasonably well since her last visit. She has noticed lightheadedness when changing position and feeling "loopy". Her heart rate has been slower recently.   Today, she denies symptoms of palpitations, chest pain, shortness of breath, orthopnea, PND, lower extremity edema, presyncope, syncope, snoring, daytime somnolence, bleeding, or neurologic sequela. The patient is tolerating medications without difficulties and is otherwise without complaint today.    Atrial Fibrillation Risk Factors:  she does have symptoms of sleep apnea. she does not have a history of rheumatic fever. she does not have a history of alcohol use. The patient does not have a history of early familial atrial fibrillation or other arrhythmias.  she has a BMI of Body mass index is 29.47 kg/m.Marland Kitchen Filed Weights   01/20/22 0851  Weight: 82.8 kg      Family History  Problem Relation Age of Onset   Arthritis Mother    Cancer Mother        colon cancer   Diabetes Mother    Hypertension Mother    Heart murmur Mother    Early death Brother    Heart murmur Brother    Lung cancer Brother    Arthritis Maternal Grandmother    Heart disease Maternal Grandmother        massive heart attack   Heart disease Paternal Grandmother    Asthma Brother    Diabetes Brother    Hypertension Brother    Heart murmur Brother    Heart disease Father        [id number]- [id number]     Atrial Fibrillation Management history:  Previous antiarrhythmic drugs: dofetilide Previous cardioversions: none Previous ablations: none CHADS2VASC score: 4 Anticoagulation history: Eliquis   Past Medical History:  Diagnosis Date   Aortic stenosis    Echo 06/2019: mod AS, mean 28 mmHg, peak velocity 3.6 m/s // Echo 9/21: EF 60-65 no RWMA, moderate LVH, normal RVSF, trivial MR, trivial AI, moderate aortic stenosis (mean gradient 36 mmHg; V-max 3.64 m/s; DI 0.18) [reviewed with Dr. Burt Knack - mod AS; rpt echo 1 yr]     CKD (chronic kidney disease)    Hypertension    Nonischemic cardiomyopathy    Tachy induced CM // TEE 12/2018: EF 30-35 // Echo 06/2019: EF 55-60 // Cor CTA 8/18: Ca score 0; no CAD   Persistent atrial fibrillation    Admx 12/19 w AF w RVR // R+L atrial thrombus on TEE -  DCCV canceled; repeat TEE in 01/2019 w some residual clot // Dofetilide Rx started 07/2019 >> NSR restored without DCCV // CHADS-VASc 4 (6 if include LAA clot) >> Apixaban    Past Surgical History:  Procedure Laterality Date   TEE WITHOUT CARDIOVERSION N/A 12/06/2018   Procedure: TRANSESOPHAGEAL ECHOCARDIOGRAM (TEE);  Surgeon: Lelon Perla, MD;  Location: [facility] ENDOSCOPY;  Service: Cardiovascular;  Laterality: N/A;   TEE WITHOUT CARDIOVERSION N/A 01/20/2019   Procedure: TRANSESOPHAGEAL ECHOCARDIOGRAM (TEE);  Surgeon: [name], MD;  Location: [facility] ENDOSCOPY;  Service:  Cardiovascular;  Laterality: N/A;   TONSILLECTOMY      Current Outpatient Medications  Medication Sig Dispense Refill   acetaminophen (TYLENOL) 500 MG tablet Take 500 mg by mouth every 6 (six) hours as needed (for pain.).     atorvastatin (LIPITOR) 20 MG tablet Take 1 tablet (20 mg total) by mouth daily. 90 tablet 1   calcium-vitamin D (OSCAL WITH D) 500-200 MG-UNIT TABS tablet Take 1 tablet by mouth daily.      dofetilide (TIKOSYN) 250 MCG capsule Take 1 capsule (250 mcg total) by mouth 2 (two) times daily. 60 capsule 11   ELIQUIS 5 MG TABS tablet TAKE 1 TABLET BY MOUTH EVERY 12 HOURS 180 tablet 2   furosemide (LASIX) 40 MG tablet Take 1 tablet by mouth once daily 90 tablet 1   hydrALAZINE (APRESOLINE) 50 MG tablet TAKE 1 TABLET BY MOUTH THREE TIMES DAILY 270 tablet 1   loratadine (CLARITIN) 10 MG tablet Take 10 mg by mouth daily as needed for allergies.     metoprolol tartrate (LOPRESSOR) 50 MG tablet Take 1 tablet (50 mg total) by mouth 2 (two) times daily. 180 tablet 1   mometasone (ELOCON) 0.1 % ointment Apply topically as needed (for skin rash).     nitroGLYCERIN (NITROSTAT) 0.4 MG SL tablet Place 1 tablet (0.4 mg total) under the tongue every 5 (five) minutes as needed for chest pain. 20 tablet 0   Olopatadine HCl 0.2 % SOLN Apply 1 drop to eye daily. One drop to affected eye once daily 2.5 mL 2   Omega-3 1000 MG CAPS Take 1,000 mg by mouth daily.      VITAMIN D PO Take 1 tablet by mouth daily.     diltiazem (CARDIZEM CD) 120 MG 24 hr capsule Take 1 capsule (120 mg total) by mouth daily. 30 capsule 3   No current facility-administered medications for this encounter.    No Known Allergies  Social History   Socioeconomic History   Marital status: Divorced    Spouse name: Not on file   Number of children: Not on file   Years of education: Not on file   Highest education level: Not on file  Occupational History   Not on file  Tobacco Use   Smoking status: Former    Types:  Cigarettes    Quit date: 12/05/2011    Years since quitting: 10.1   Smokeless tobacco: Never   Tobacco comments:    Former smoker 01/20/22  Vaping Use   Vaping Use: Never used  Substance and Sexual Activity   Alcohol use: Yes    Alcohol/week: 1.0 standard drink    Types: 1 Glasses of wine per week    Comment: occasionally   Drug use: No   Sexual activity: Not Currently    Birth control/protection: None  Other Topics Concern   Not on file  Social History Narrative   Not on file   Social Determinants  of Health   Financial Resource Strain: Not on file  Food Insecurity: Not on file  Transportation Needs: Not on file  Physical Activity: Not on file  Stress: Not on file  Social Connections: Not on file  Intimate Partner Violence: Not on file     ROS- All systems are reviewed and negative except as per the HPI above.  Physical Exam: Vitals:   01/20/22 0851  BP: 104/68  Pulse: (!) 47  Weight: 82.8 kg  Height: 5\' 6"  (1.676 m)    GEN- The patient is a well appearing female, alert and oriented x 3 today.   HEENT-head normocephalic, atraumatic, sclera clear, conjunctiva pink, hearing intact, trachea midline. Lungs- Clear to ausculation bilaterally, normal work of breathing Heart- Regular rate and rhythm, no rubs or gallops, 2/6 systolic murmur   GI- soft, NT, ND, + BS Extremities- no clubbing, cyanosis, or edema MS- no significant deformity or atrophy Skin- no rash or lesion Psych- euthymic mood, full affect Neuro- strength and sensation are intact   Wt Readings from Last 3 Encounters:  01/20/22 82.8 kg  11/22/21 83.5 kg  10/17/21 84.8 kg    EKG today demonstrates  SB Vent. rate 47 BPM PR interval 196 ms QRS duration 72 ms QT/QTcB 520/460 ms  Echo 06/18/19 demonstrated  1. Moderate hypokinesis of the left ventricular, mid-apical inferior wall.  2. The left ventricle has normal systolic function, with an ejection fraction of 55-60%. The cavity size was normal.  There is moderately increased left ventricular wall thickness. Left ventricular diastolic Doppler parameters are indeterminate.  3. The right ventricle has normal systolic function. The cavity was normal. There is no increase in right ventricular wall thickness.  4. Left atrial size was mildly dilated.  5. The aortic valve is tricuspid. Mild thickening of the aortic valve. Mild calcification of the aortic valve. Aortic valve regurgitation is trivial by color flow Doppler. Moderate stenosis of the aortic valve.  6. Peak velocity 3.30m/s, mean gradient 75mmHg.  Epic records are reviewed at length today  CHA2DS2-VASc Score = 4  The patient's score is based upon: CHF History: 1 HTN History: 1 Diabetes History: 0 Stroke History: 0 Vascular Disease History: 0 Age Score: 1 Gender Score: 1       ASSESSMENT AND PLAN: 1. Persistent Atrial Fibrillation (ICD10:  I48.19) The patient's CHA2DS2-VASc score is 4, indicating a 4.8% annual risk of stroke.   Patient appears to be maintaining SR. Continue dofetilide 250 mcg BID. QT stable. Continue metoprolol 50 mg BID Decrease diltiazem to 120 mg daily given symptomatic bradycardia. Patient to call clinic back in one week with heart rate and BP. Continue Eliquis 5 mg BID Check bmet/mag today.  2. Secondary Hypercoagulable State (ICD10:  D68.69) The patient is at significant risk for stroke/thromboembolism based upon her CHA2DS2-VASc Score of 4.  Continue Apixaban (Eliquis).   3. Cardiomyopathy  EF recovered No signs or symptoms of fluid overload.  4. HTN Stable, med changes as above.  5. Aortic stenosis  Moderate on echo. Followed by Dr Burt Knack.   Follow up in the AF clinic in 6 months. Dr Burt Knack as scheduled.    [facility] [address] [place], [place] [address] [phone] 01/20/2022 9:23 AM

## 2022-01-20 NOTE — Patient Instructions (Signed)
Decrease cardizem to 120mg  once a day --- call with an update of heart rates next Friday [phone] [facility] RN)  Normal heart rates 60-100 Normal BP 110-140/60-90

## 2022-01-27 ENCOUNTER — Telehealth (HOSPITAL_COMMUNITY): Payer: Self-pay | Admitting: *Deleted

## 2022-01-27 NOTE — Telephone Encounter (Signed)
Patient called in with update of HR/BP since decreasing cardizem to 120mg  once a day. HR 57-74 BP 108/63-142/74 Feeling "great and not feeling loopy."  Per Clint Fenton PA will continue current medication regimen. Pt in agreement.

## 2022-02-16 ENCOUNTER — Other Ambulatory Visit: Payer: Self-pay | Admitting: Physician Assistant

## 2022-02-16 ENCOUNTER — Other Ambulatory Visit: Payer: Self-pay | Admitting: Cardiovascular Disease

## 2022-02-21 ENCOUNTER — Encounter: Payer: Self-pay | Admitting: Cardiovascular Disease

## 2022-02-21 ENCOUNTER — Ambulatory Visit: Payer: Medicare PPO | Admitting: Cardiovascular Disease

## 2022-02-21 ENCOUNTER — Other Ambulatory Visit: Payer: Self-pay

## 2022-02-21 ENCOUNTER — Ambulatory Visit (HOSPITAL_COMMUNITY): Payer: Medicare PPO | Attending: Cardiology

## 2022-02-21 VITALS — BP 110/60 | HR 57 | Ht 66.5 in | Wt 176.8 lb

## 2022-02-21 DIAGNOSIS — I350 Nonrheumatic aortic (valve) stenosis: Secondary | ICD-10-CM | POA: Diagnosis not present

## 2022-02-21 DIAGNOSIS — I480 Paroxysmal atrial fibrillation: Secondary | ICD-10-CM | POA: Diagnosis not present

## 2022-02-21 LAB — ECHOCARDIOGRAM COMPLETE
AR max vel: 0.54 cm2
AV Area VTI: 0.52 cm2
AV Area mean vel: 0.47 cm2
AV Mean grad: 34 mmHg
AV Peak grad: 51.8 mmHg
Ao pk vel: 3.6 m/s
Area-P 1/2: 2.26 cm2
S' Lateral: 2.2 cm

## 2022-02-21 NOTE — Progress Notes (Signed)
?Cardiology Office Note:   ? ?Date:  02/23/2022  ? ?ID:  Tracy James, DOB 1951/08/21, MRN [id number] ? ?PCP:  Charlott Rakes, MD ?  ?[facility] Providers ?Cardiologist:  Sherren Mocha, MD    ? ?Referring MD: Charlott Rakes, MD  ? ?Chief Complaint  ?Patient presents with  ? Aortic Stenosis  ? ? ?History of Present Illness:   ? ?Tracy James is a 71 y.o. 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 ?The patient is here alone today.  She is currently doing well.  She has no symptoms of chest pain, chest pressure, or shortness of breath.  She denies lightheadedness or syncope.  She denies orthopnea, PND, or leg swelling.  I saw her last in October 2022 and there was some discrepancy between the morphologic appearance of the aortic valve on 2D echo and the Doppler data.  The valve appeared to open reasonably well with only mild stenosis.  With Doppler data suggested severe aortic stenosis.  The patient ultimately underwent a gated cardiac CTA which showed a bicuspid aortic valve with raphae between the right and left coronary cusps with severely reduced cusp separation.  However, the aortic valve calcium score was only 408.  She was incidentally noted to have an ovarian mass.  She was referred for gynecologic evaluation and was  felt to have reassuring findings of negative tumor markers.  A follow-up MRI will be performed in about 6 months from the time of her last visit in December 2022. ? ?Past Medical History:  ?Diagnosis Date  ? Aortic stenosis   ? Echo 06/2019: mod AS, mean 28 mmHg, peak velocity 3.6 m/s // Echo 9/21: EF 60-65 no RWMA, moderate LVH, normal RVSF, trivial MR, trivial AI, moderate aortic stenosis (mean gradient 36 mmHg; V-max 3.64 m/s; DI 0.18) [reviewed with Dr. Burt Knack - mod AS; rpt echo 1 yr]    ? CKD (chronic kidney disease)   ? Hypertension   ? Nonischemic cardiomyopathy   ? Tachy induced CM // TEE 12/2018: EF 30-35 // Echo 06/2019: EF 55-60 // Cor CTA 8/18: Ca score 0; no CAD  ? Persistent atrial fibrillation   ? Admx 12/19 w AF w RVR // R+L atrial thrombus on TEE - DCCV canceled; repeat TEE in 01/2019 w some residual clot // Dofetilide Rx started 07/2019 >> NSR restored without DCCV // CHADS-VASc 4 (6 if include LAA clot) >> Apixaban   ? ? ?Past Surgical History:  ?Procedure Laterality Date  ? TEE WITHOUT CARDIOVERSION N/A 12/06/2018  ? Procedure: TRANSESOPHAGEAL ECHOCARDIOGRAM (TEE);  Surgeon: Lelon Perla, MD;  Location: [facility] ENDOSCOPY;  Service: Cardiovascular;  Laterality: N/A;  ? TEE WITHOUT CARDIOVERSION N/A 01/20/2019  ? Procedure: TRANSESOPHAGEAL ECHOCARDIOGRAM (TEE);  Surgeon: [name], MD;  Location: [place];  Service: Cardiovascular;  Laterality: N/A;  ?  TONSILLECTOMY    ? ? ?Current Medications: ?Current Meds  ?Medication Sig  ? acetaminophen (TYLENOL) 500 MG tablet Take 500 mg by mouth every 6 (six) hours as needed (for pain.).  ? atorvastatin (LIPITOR) 20 MG tablet Take 1 tablet (20 mg total) by mouth daily.  ? calcium-vitamin D (OSCAL WITH D) 500-200 MG-UNIT TABS tablet Take 1 tablet by mouth daily.   ? diltiazem (CARDIZEM CD) 120 MG 24 hr capsule Take 1 capsule (120 mg total) by mouth daily.  ? dofetilide (TIKOSYN) 250 MCG capsule Take 1 capsule by mouth twice daily  ? ELIQUIS 5 MG TABS  tablet TAKE 1 TABLET BY MOUTH EVERY 12 HOURS  ? furosemide (LASIX) 40 MG tablet Take 1 tablet by mouth once daily  ? hydrALAZINE (APRESOLINE) 50 MG tablet TAKE 1 TABLET BY MOUTH THREE TIMES DAILY  ? loratadine (CLARITIN) 10 MG tablet Take 10 mg by mouth daily as needed for allergies.  ? metoprolol tartrate (LOPRESSOR) 50 MG tablet Take 1 tablet by mouth twice daily  ? mometasone (ELOCON) 0.1 % ointment Apply topically as needed (for skin rash).  ? nitroGLYCERIN (NITROSTAT) 0.4 MG SL tablet Place 1 tablet (0.4 mg total) under the tongue every 5 (five) minutes as needed for chest pain.  ? Olopatadine HCl 0.2 % SOLN Apply 1 drop to eye daily. One drop to affected eye once daily  ? Omega-3 1000 MG CAPS Take 1,000 mg by mouth daily.   ? VITAMIN D PO Take 1 tablet by mouth daily.  ?  ? ?Allergies:   Patient has no known allergies.  ? ?Social History  ? ?Socioeconomic History  ? Marital status: Divorced  ?  Spouse name: Not on file  ? Number of children: Not on file  ? Years of education: Not on file  ? Highest education level: Not on file  ?Occupational History  ? Not on file  ?Tobacco Use  ? Smoking status: Former  ?  Types: Cigarettes  ?  Quit date: 12/05/2011  ?  Years since quitting: 10.2  ? Smokeless tobacco: Never  ? Tobacco comments:  ?  Former smoker 01/20/22  ?Vaping Use  ? Vaping Use: Never used  ?Substance and Sexual Activity  ? Alcohol use: Yes  ?  Alcohol/week: 1.0 standard drink  ?  Types: 1 Glasses of wine per week  ?  Comment: occasionally  ? Drug use: No  ? Sexual activity: Not Currently  ?  Birth control/protection: None  ?Other Topics Concern  ? Not on file  ?Social History Narrative  ? Not on file  ? ?Social Determinants of Health  ? ?Financial Resource Strain: Not on file  ?Food Insecurity: Not on file  ?Transportation Needs: Not on file  ?Physical Activity: Not on file  ?Stress: Not on file  ?Social Connections: Not on file  ?  ? ?Family History: ?The patient's family history includes Arthritis in her  maternal grandmother and mother; Asthma in her brother; Cancer in her mother; Diabetes in her brother and mother; Early death in her brother; Heart disease in her father, maternal grandmother, and paternal grandmother; Heart murmur in her brother, brother, and mother; Hypertension in her brother and mother; Lung cancer in her brother. ? ?ROS:   ?Please see the history of present illness.    ?All other systems reviewed and are negative. ? ?EKGs/Labs/Other Studies Reviewed:   ? ?The following studies were reviewed today: ?Today's echo images are reviewed.  She again appears to have normal LV  systolic function.  The aortic valve is mildly calcified and restricted.  However the gradients are in the same range with a peak and mean transaortic gradient of 52 and 33 mmHg, respectively.  The peak transaortic velocity is 3.6 m/s.  The dimensionless index is 0.17.  These findings are stable from past studies and suggest moderately severe aortic stenosis. ? ?EKG:  EKG is not ordered today.   ?Recent Labs: ?07/15/2021: Hemoglobin 13.1; Platelets 301 ?01/20/2022: BUN 30; Creatinine, Ser 1.58; Magnesium 2.1; Potassium 5.4; Sodium 140  ?Recent Lipid Panel ?   ?Component Value Date/Time  ? CHOL 144 10/19/2021 1036  ? TRIG 126 10/19/2021 1036  ? HDL 40 10/19/2021 1036  ? CHOLHDL 4.1 11/07/2019 1058  ? CHOLHDL 3.2 09/26/2017 0910  ? VLDL 52 (H) 09/22/2016 0829  ? [place] 81 10/19/2021 1036  ? [place] 95 09/26/2017 0910  ? ? ? ?Risk Assessment/Calculations:   ? ?CHA2DS2-VASc Score =    ? This indicates a  % annual risk of stroke. ?The patient's score is based upon: ?  ?  ? ? ?    ? ?Physical Exam:   ? ?VS:  BP 110/60   Pulse (!) 57   Ht 5' 6.5" (1.689 m)   Wt 176 lb 12.8 oz (80.2 kg)   SpO2 98%   BMI 28.11 kg/m?    ? ?Wt Readings from Last 3 Encounters:  ?02/21/22 176 lb 12.8 oz (80.2 kg)  ?01/20/22 182 lb 9.6 oz (82.8 kg)  ?11/22/21 184 lb (83.5 kg)  ?  ? ?GEN:  Well nourished, well developed in no acute distress ?HEENT:  Normal ?NECK: No JVD; No carotid bruits ?LYMPHATICS: No lymphadenopathy ?CARDIAC: RRR, 2/6 systolic murmur at the right upper sternal border ?RESPIRATORY:  Clear to auscultation without rales, wheezing or rhonchi  ?ABDOM

## 2022-02-21 NOTE — Patient Instructions (Signed)
Medication Instructions:  ?Your physician recommends that you continue on your current medications as directed. Please refer to the Current Medication list given to you today. ? ?*If you need a refill on your cardiac medications before your next appointment, please call your pharmacy* ? ? ?Lab Work: ?NONE ?If you have labs (blood work) drawn today and your tests are completely normal, you will receive your results only by: ?MyChart Message (if you have MyChart) OR ?A paper copy in the mail ?If you have any lab test that is abnormal or we need to change your treatment, we will call you to review the results. ? ? ?Testing/Procedures: ?NONE ? ? ?Follow-Up: ?At [facility], you and your health needs are our priority.  As part of our continuing mission to provide you with exceptional heart care, we have created designated Provider Care Teams.  These Care Teams include your primary Cardiologist (physician) and Advanced Practice Providers (APPs -  Physician Assistants and Nurse Practitioners) who all work together to provide you with the care you need, when you need it. ? ?We recommend signing up for the patient portal called "MyChart".  Sign up information is provided on this After Visit Summary.  MyChart is used to connect with patients for Virtual Visits (Telemedicine).  Patients are able to view lab/test results, encounter notes, upcoming appointments, etc.  Non-urgent messages can be sent to your provider as well.   ?To learn more about what you can do with MyChart, go to [email or web address].   ? ?Your next appointment:   ?6 month(s) ? ?The format for your next appointment:   ?In Person ? ?Provider:   ?Nell Range or Kathyrn Drown, NP. Then, Sherren Mocha, MD will plan to see you again in 1 year(s).{ ? ?  ?

## 2022-02-23 ENCOUNTER — Other Ambulatory Visit (HOSPITAL_COMMUNITY): Payer: Medicare PPO

## 2022-02-23 ENCOUNTER — Ambulatory Visit: Payer: Medicare PPO | Admitting: Cardiovascular Disease

## 2022-03-14 ENCOUNTER — Telehealth: Payer: Self-pay | Admitting: Cardiovascular Disease

## 2022-03-14 NOTE — Telephone Encounter (Signed)
STAT if patient feels like he/she is going to faint  ? ?Are you dizzy now? Yes  ? ?Do you feel faint or have you passed out? No ? ?Do you have any other symptoms? Hear Ringing in ears ? ?Have you checked your HR and BP (record if available)? No ? ?Pt states that the only time she doesn't feel dizzy is when she is laying down still. ?

## 2022-03-14 NOTE — Telephone Encounter (Signed)
Returned call to Pt. ? ?Pt has had recurrent dizziness.  BP 117/71 pulse 61. ? ?Per review of past heart rates, Pt's heart rate can run into the 40's at time. ? ?Pt has not worn a HM. ? ?Scheduled appt to see Dr. Rayann Heman to evaluate. ? ?Pt in agreement. ?

## 2022-03-17 ENCOUNTER — Ambulatory Visit (HOSPITAL_BASED_OUTPATIENT_CLINIC_OR_DEPARTMENT_OTHER): Payer: Medicare PPO | Admitting: Internal Medicine

## 2022-03-17 ENCOUNTER — Encounter (HOSPITAL_BASED_OUTPATIENT_CLINIC_OR_DEPARTMENT_OTHER): Payer: Self-pay | Admitting: Internal Medicine

## 2022-03-17 VITALS — BP 100/50 | HR 61 | Ht 66.0 in | Wt 173.8 lb

## 2022-03-17 DIAGNOSIS — I350 Nonrheumatic aortic (valve) stenosis: Secondary | ICD-10-CM | POA: Diagnosis not present

## 2022-03-17 DIAGNOSIS — I4819 Other persistent atrial fibrillation: Secondary | ICD-10-CM | POA: Diagnosis not present

## 2022-03-17 DIAGNOSIS — I10 Essential (primary) hypertension: Secondary | ICD-10-CM

## 2022-03-17 DIAGNOSIS — I420 Dilated cardiomyopathy: Secondary | ICD-10-CM | POA: Diagnosis not present

## 2022-03-17 MED ORDER — METOPROLOL TARTRATE 25 MG PO TABS: 25.0000 mg | ORAL_TABLET | Freq: Two times a day (BID) | ORAL | 3 refills | Status: DC

## 2022-03-17 NOTE — Progress Notes (Signed)
? ? ?PCP: Charlott Rakes, MD ?Primary Cardiologist: Dr Burt Knack ?Primary EP: Dr Rayann Heman ? ?Tracy James is a 71 y.o. female who presents today for add on electrophysiology followup.  She recently called complaining of dizziness and low HR. ?She reports that she has felt to be "in a fog".  She had similar symptoms in the AF clinic [facility] note reviewed) and had her diltiazem reduced.  She felt better initially but has had return of symptoms.  She feels that her BP and HR are frequently both low. ?She is unaware of any recent AF events and has done very well with tikosyn. ?Today, she denies symptoms of palpitations, chest pain, shortness of breath,  lower extremity edema, presyncope, or syncope.  The patient is otherwise without complaint today.  ? ?Past Medical History:  ?Diagnosis Date  ? Aortic stenosis   ? Echo 06/2019: mod AS, mean 28 mmHg, peak velocity 3.6 m/s // Echo 9/21: EF 60-65 no RWMA, moderate LVH, normal RVSF, trivial MR, trivial AI, moderate aortic stenosis (mean gradient 36 mmHg; V-max 3.64 m/s; DI 0.18) [reviewed with Dr. Burt Knack - mod AS; rpt echo 1 yr]    ? CKD (chronic kidney disease)   ? Hypertension   ? Nonischemic cardiomyopathy   ? Tachy induced CM // TEE 12/2018: EF 30-35 // Echo 06/2019: EF 55-60 // Cor CTA 8/18: Ca score 0; no CAD  ? Persistent atrial fibrillation   ? Admx 12/19 w AF w RVR // R+L atrial thrombus on TEE - DCCV canceled; repeat TEE in 01/2019 w some residual clot // Dofetilide Rx started 07/2019 >> NSR restored without DCCV // CHADS-VASc 4 (6 if include LAA clot) >> Apixaban   ? ?Past Surgical History:  ?Procedure Laterality Date  ? TEE WITHOUT CARDIOVERSION N/A 12/06/2018  ? Procedure: TRANSESOPHAGEAL ECHOCARDIOGRAM (TEE);  Surgeon: Lelon Perla, MD;  Location: [facility] ENDOSCOPY;  Service: Cardiovascular;  Laterality: N/A;  ? TEE WITHOUT CARDIOVERSION N/A 01/20/2019  ? Procedure: TRANSESOPHAGEAL ECHOCARDIOGRAM (TEE);  Surgeon: [name], MD;  Location: [facility] ENDOSCOPY;   Service: Cardiovascular;  Laterality: N/A;  ? TONSILLECTOMY    ? ? ?ROS- all systems are reviewed and negatives except as per HPI above ? ?Current Outpatient Medications  ?Medication Sig Dispense Refill  ? acetaminophen (TYLENOL) 500 MG tablet Take 500 mg by mouth every 6 (six) hours as needed (for pain.).    ? atorvastatin (LIPITOR) 20 MG tablet Take 1 tablet (20 mg total) by mouth daily. 90 tablet 1  ? calcium-vitamin D (OSCAL WITH D) 500-200 MG-UNIT TABS tablet Take 1 tablet by mouth daily.     ? diltiazem (CARDIZEM CD) 120 MG 24 hr capsule Take 1 capsule (120 mg total) by mouth daily. 30 capsule 3  ? dofetilide (TIKOSYN) 250 MCG capsule Take 1 capsule by mouth twice daily 180 capsule 2  ? ELIQUIS 5 MG TABS tablet TAKE 1 TABLET BY MOUTH EVERY 12 HOURS 180 tablet 2  ? furosemide (LASIX) 40 MG tablet Take 1 tablet by mouth once daily 90 tablet 1  ? hydrALAZINE (APRESOLINE) 50 MG tablet TAKE 1 TABLET BY MOUTH THREE TIMES DAILY 270 tablet 1  ? loratadine (CLARITIN) 10 MG tablet Take 10 mg by mouth daily as needed for allergies.    ? metoprolol tartrate (LOPRESSOR) 50 MG tablet Take 1 tablet by mouth twice daily 180 tablet 2  ? mometasone (ELOCON) 0.1 % ointment Apply topically as needed (for skin rash).    ? nitroGLYCERIN (NITROSTAT) 0.4  MG SL tablet Place 1 tablet (0.4 mg total) under the tongue every 5 (five) minutes as needed for chest pain. 20 tablet 0  ? Olopatadine HCl 0.2 % SOLN Apply 1 drop to eye daily. One drop to affected eye once daily 2.5 mL 2  ? Omega-3 1000 MG CAPS Take 1,000 mg by mouth daily.     ? VITAMIN D PO Take 1 tablet by mouth daily.    ? ?No current facility-administered medications for this visit.  ? ? ?Physical Exam: ?There were no vitals filed for this visit. ? ?GEN- The patient is well appearing, alert and oriented x 3 today.   ?Head- normocephalic, atraumatic ?Eyes-  Sclera clear, conjunctiva pink ?Ears- hearing intact ?Oropharynx- clear ?Lungs- Clear to ausculation bilaterally, normal  work of breathing ?Heart- Regular rate and rhythm, 2/6 SEM LUSB which is late peaking ?GI- soft, NT, ND, + BS ?Extremities- no clubbing, cyanosis, or edema ? ?Wt Readings from Last 3 Encounters:  ?02/21/22 176 lb 12.8 oz (80.2 kg)  ?01/20/22 182 lb 9.6 oz (82.8 kg)  ?11/22/21 184 lb (83.5 kg)  ? ? ?EKG tracing ordered today is personally reviewed and shows sinus rhythm 61 bpm, PR 174 msec, QRS 92 msce QTc 491 msec ? ?Assessment and Plan: ? ?Persistent afib ?Well controlled with tikosyn ?Chads2vasc score is at lest 4.   ?Labs 01/20/22 reviewed ?Stop diltiazem and reduce metoprolol to '25mg'$  BID given bradycardia and subjective dizziness. ? ?2. Chronic diastolic dysfunction ?Stable ?No change required today ? ?3. HTN ?Low recently with symptoms as above. ?Stop diltiazem as above ?Reduce metoprolol as above ?Consider reducing hydralazine if BP remains low going forward. ? ?4. AS ?Stable ?No change required today ?Dr Burt Knack note from 02/21/22 reviewed ? ?Risks, benefits and potential toxicities for medications prescribed and/or refilled reviewed with patient today.  ? ?Follow-up with AF clinic in 3 months ? ?Thompson Grayer MD, FACC ?03/17/2022 ?4:30 PM ? ? ? ? ?

## 2022-03-17 NOTE — Patient Instructions (Addendum)
Medication Instructions:  ?Your physician has recommended you make the following change in your medication:  ? ? STOP diltiazem ? ?2.   DECREASE your metoprolol tartrate-  Take 25 mg by mouth twice a day ? ? ?Labwork: ?None ordered. ? ?Testing/Procedures: ?None ordered. ? ?Follow-Up: ?Your physician wants you to follow-up in: 3 months with AFIB clinic. ? ? ?Any Other Special Instructions Will Be Listed Below (If Applicable). ? ?If you need a refill on your cardiac medications before your next appointment, please call your pharmacy.  ? ?Important Information About Sugar ? ? ? ? ? ? ? ?

## 2022-03-27 ENCOUNTER — Encounter (HOSPITAL_COMMUNITY): Payer: Self-pay | Admitting: Emergency Medicine

## 2022-03-27 ENCOUNTER — Ambulatory Visit: Payer: Self-pay | Admitting: *Deleted

## 2022-03-27 ENCOUNTER — Other Ambulatory Visit: Payer: Self-pay

## 2022-03-27 ENCOUNTER — Inpatient Hospital Stay (HOSPITAL_COMMUNITY)
Admission: EM | Admit: 2022-03-27 | Discharge: 2022-03-30 | DRG: 378 | Disposition: A | Discharge status: Home / Self Care | Payer: Medicare PPO | Attending: Internal Medicine | Admitting: Internal Medicine

## 2022-03-27 DIAGNOSIS — I10 Essential (primary) hypertension: Secondary | ICD-10-CM | POA: Diagnosis not present

## 2022-03-27 DIAGNOSIS — Z825 Family history of asthma and other chronic lower respiratory diseases: Secondary | ICD-10-CM

## 2022-03-27 DIAGNOSIS — D6832 Hemorrhagic disorder due to extrinsic circulating anticoagulants: Secondary | ICD-10-CM | POA: Diagnosis present

## 2022-03-27 DIAGNOSIS — E1165 Type 2 diabetes mellitus with hyperglycemia: Secondary | ICD-10-CM | POA: Diagnosis not present

## 2022-03-27 DIAGNOSIS — D62 Acute posthemorrhagic anemia: Secondary | ICD-10-CM

## 2022-03-27 DIAGNOSIS — Z8261 Family history of arthritis: Secondary | ICD-10-CM | POA: Diagnosis not present

## 2022-03-27 DIAGNOSIS — Z8249 Family history of ischemic heart disease and other diseases of the circulatory system: Secondary | ICD-10-CM | POA: Diagnosis not present

## 2022-03-27 DIAGNOSIS — Z79899 Other long term (current) drug therapy: Secondary | ICD-10-CM | POA: Diagnosis not present

## 2022-03-27 DIAGNOSIS — F17200 Nicotine dependence, unspecified, uncomplicated: Secondary | ICD-10-CM

## 2022-03-27 DIAGNOSIS — E663 Overweight: Secondary | ICD-10-CM | POA: Diagnosis present

## 2022-03-27 DIAGNOSIS — Z833 Family history of diabetes mellitus: Secondary | ICD-10-CM

## 2022-03-27 DIAGNOSIS — N1832 Chronic kidney disease, stage 3b: Secondary | ICD-10-CM | POA: Diagnosis present

## 2022-03-27 DIAGNOSIS — K5731 Diverticulosis of large intestine without perforation or abscess with bleeding: Secondary | ICD-10-CM | POA: Diagnosis present

## 2022-03-27 DIAGNOSIS — E785 Hyperlipidemia, unspecified: Secondary | ICD-10-CM | POA: Diagnosis present

## 2022-03-27 DIAGNOSIS — E669 Obesity, unspecified: Secondary | ICD-10-CM | POA: Diagnosis present

## 2022-03-27 DIAGNOSIS — R4781 Slurred speech: Secondary | ICD-10-CM | POA: Diagnosis not present

## 2022-03-27 DIAGNOSIS — K635 Polyp of colon: Secondary | ICD-10-CM | POA: Diagnosis not present

## 2022-03-27 DIAGNOSIS — I130 Hypertensive heart and chronic kidney disease with heart failure and stage 1 through stage 4 chronic kidney disease, or unspecified chronic kidney disease: Secondary | ICD-10-CM | POA: Diagnosis present

## 2022-03-27 DIAGNOSIS — I5042 Chronic combined systolic (congestive) and diastolic (congestive) heart failure: Secondary | ICD-10-CM | POA: Diagnosis not present

## 2022-03-27 DIAGNOSIS — K921 Melena: Secondary | ICD-10-CM | POA: Diagnosis not present

## 2022-03-27 DIAGNOSIS — K922 Gastrointestinal hemorrhage, unspecified: Secondary | ICD-10-CM | POA: Diagnosis present

## 2022-03-27 DIAGNOSIS — I4891 Unspecified atrial fibrillation: Secondary | ICD-10-CM | POA: Diagnosis not present

## 2022-03-27 DIAGNOSIS — I509 Heart failure, unspecified: Secondary | ICD-10-CM | POA: Diagnosis not present

## 2022-03-27 DIAGNOSIS — F17210 Nicotine dependence, cigarettes, uncomplicated: Secondary | ICD-10-CM | POA: Diagnosis present

## 2022-03-27 DIAGNOSIS — E1122 Type 2 diabetes mellitus with diabetic chronic kidney disease: Secondary | ICD-10-CM | POA: Diagnosis present

## 2022-03-27 DIAGNOSIS — R58 Hemorrhage, not elsewhere classified: Secondary | ICD-10-CM | POA: Diagnosis not present

## 2022-03-27 DIAGNOSIS — I110 Hypertensive heart disease with heart failure: Secondary | ICD-10-CM | POA: Diagnosis not present

## 2022-03-27 DIAGNOSIS — Z7901 Long term (current) use of anticoagulants: Secondary | ICD-10-CM | POA: Diagnosis not present

## 2022-03-27 DIAGNOSIS — T45515A Adverse effect of anticoagulants, initial encounter: Secondary | ICD-10-CM | POA: Diagnosis present

## 2022-03-27 DIAGNOSIS — I5022 Chronic systolic (congestive) heart failure: Secondary | ICD-10-CM | POA: Diagnosis present

## 2022-03-27 DIAGNOSIS — I350 Nonrheumatic aortic (valve) stenosis: Secondary | ICD-10-CM

## 2022-03-27 DIAGNOSIS — F10929 Alcohol use, unspecified with intoxication, unspecified: Secondary | ICD-10-CM | POA: Diagnosis not present

## 2022-03-27 DIAGNOSIS — Z801 Family history of malignant neoplasm of trachea, bronchus and lung: Secondary | ICD-10-CM

## 2022-03-27 DIAGNOSIS — IMO0001 Reserved for inherently not codable concepts without codable children: Secondary | ICD-10-CM

## 2022-03-27 DIAGNOSIS — D120 Benign neoplasm of cecum: Secondary | ICD-10-CM

## 2022-03-27 DIAGNOSIS — K5790 Diverticulosis of intestine, part unspecified, without perforation or abscess without bleeding: Secondary | ICD-10-CM | POA: Diagnosis not present

## 2022-03-27 DIAGNOSIS — I4819 Other persistent atrial fibrillation: Secondary | ICD-10-CM | POA: Diagnosis present

## 2022-03-27 DIAGNOSIS — E876 Hypokalemia: Secondary | ICD-10-CM

## 2022-03-27 DIAGNOSIS — I959 Hypotension, unspecified: Secondary | ICD-10-CM | POA: Diagnosis not present

## 2022-03-27 DIAGNOSIS — D6869 Other thrombophilia: Secondary | ICD-10-CM | POA: Diagnosis present

## 2022-03-27 DIAGNOSIS — I4510 Unspecified right bundle-branch block: Secondary | ICD-10-CM | POA: Diagnosis present

## 2022-03-27 DIAGNOSIS — Z6828 Body mass index (BMI) 28.0-28.9, adult: Secondary | ICD-10-CM

## 2022-03-27 DIAGNOSIS — Z5181 Encounter for therapeutic drug level monitoring: Secondary | ICD-10-CM

## 2022-03-27 HISTORY — DX: Heart failure, unspecified: I50.9

## 2022-03-27 LAB — TYPE AND SCREEN
ABO/RH(D): A POS
Antibody Screen: NEGATIVE

## 2022-03-27 LAB — COMPREHENSIVE METABOLIC PANEL
ALT: 22 U/L (ref 0–44)
AST: 24 U/L (ref 15–41)
Albumin: 3.7 g/dL (ref 3.5–5.0)
Alkaline Phosphatase: 96 U/L (ref 38–126)
Anion gap: 12 (ref 5–15)
BUN: 23 mg/dL (ref 8–23)
CO2: 29 mmol/L (ref 22–32)
Calcium: 10.3 mg/dL (ref 8.9–10.3)
Chloride: 100 mmol/L (ref 98–111)
Creatinine, Ser: 1.63 mg/dL — ABNORMAL HIGH (ref 0.44–1.00)
GFR, Estimated: 34 mL/min — ABNORMAL LOW (ref >60)
Glucose, Bld: 124 mg/dL — ABNORMAL HIGH (ref 70–99)
Potassium: 4.1 mmol/L (ref 3.5–5.1)
Sodium: 141 mmol/L (ref 135–145)
Total Bilirubin: 1.3 mg/dL — ABNORMAL HIGH (ref 0.3–1.2)
Total Protein: 7.4 g/dL (ref 6.5–8.1)

## 2022-03-27 LAB — CBC
HCT: 40.7 % (ref 36.0–46.0)
Hemoglobin: 13.2 g/dL (ref 12.0–15.0)
MCH: 29 pg (ref 26.0–34.0)
MCHC: 32.4 g/dL (ref 30.0–36.0)
MCV: 89.5 fL (ref 80.0–100.0)
Platelets: 288 10*3/uL (ref 150–400)
RBC: 4.55 MIL/uL (ref 3.87–5.11)
RDW: 15.4 % (ref 11.5–15.5)
WBC: 11.4 10*3/uL — ABNORMAL HIGH (ref 4.0–10.5)
nRBC: 0 % (ref 0.0–0.2)

## 2022-03-27 LAB — LIPASE, BLOOD: Lipase: 29 U/L (ref 11–51)

## 2022-03-27 LAB — POC OCCULT BLOOD, ED: Fecal Occult Bld: POSITIVE — AB

## 2022-03-27 LAB — PROTIME-INR
INR: 1.2 (ref 0.8–1.2)
Prothrombin Time: 14.8 seconds (ref 11.4–15.2)

## 2022-03-27 LAB — ABO/RH: ABO/RH(D): A POS

## 2022-03-27 MED ORDER — PANTOPRAZOLE SODIUM 40 MG IV SOLR
40.0000 mg | Freq: Two times a day (BID) | INTRAVENOUS | Status: DC
  Administered 2022-03-27: 40 mg via INTRAVENOUS
  Filled 2022-03-27: qty 10

## 2022-03-27 MED ORDER — DEXTROSE IN LACTATED RINGERS 5 % IV SOLN: INTRAVENOUS | Status: DC

## 2022-03-27 NOTE — ED Provider Notes (Signed)
Transfer of Care Note ?I assumed care of Tracy James on 03/27/2022 at  29 PM  ?Briefly, Tracy James is a 71 y.o. female who: ?- Here for 6 epi of bright blood, on eliquis (A fib), Guaiac positive  ? ?  ?No orders to display  ? ?The plan includes: ?- admit to hospitals  ? ? ?Please refer to the original provider?s note for additional information regarding the care of Tracy James. ? ?### ?Reassessment: ?I personally reassessed the patient: ? ?Continue to be hemodynamically stable. ?Vitals:  ? 03/27/22 1745 03/27/22 1800  ?BP: 123/79 (!) 127/101  ?Pulse: 78 67  ?Resp: 16 20  ?Temp:    ?SpO2: 99% 99%  ? ? ?Additional MDM: ? ?-Dr. Fuller Plan (GI) who will plan to see patient in the morning for possible scope.  Make patient n.p.o. and start on maintenance fluid.  In addition, if patient continues to worsen to immediately consult.  We will plan observation complicated with admitting team.  Patient is admitted to the medicine service. ? ?Dispo: Admitted  ?  ?Donnamarie Poag, MD ?03/27/22 1818 ? ?  ?Drenda Freeze, MD ?03/27/22 2327 ? ?

## 2022-03-27 NOTE — Assessment & Plan Note (Addendum)
Blood pressure soft on arrival.   Now controlled only on metoprolol. ?- Hold hydralazine, furosemide for now ?- Continue metoprolol ? ?

## 2022-03-27 NOTE — ED Triage Notes (Signed)
Pt BIB [facility] from home. Pt ate pasta for dinner last night and since then has had BRB in stool. Pt endorses pain last pm but denies pain today. EMS reports copious amounts of BRB in toilet and brief. Possible ETOH on board. Hx afib, takes a high dose of eliquis. BP 90/50 sitting on toilet, supine was 118/70. No fluids given.  ?

## 2022-03-27 NOTE — ED Provider Notes (Signed)
?[facility] ?Provider Note ? ? ?CSN: [id number] ?Arrival date & time: 03/27/22  1348 ? ?  ? ?History ? ?Chief Complaint  ?Patient presents with  ? GI Bleeding  ? ? ?Tracy James is a 71 y.o. female. ? ?HPI ?Patient is a 71 year old female with past medical history significant for PAF on Eliquis, CHF, hyperlipidemia, DM 2, obesity ? ?She is presented to emergency room today with complaints of 6 episodes of bloody stool.  States that these episodes all occurred between 4 AM this morning and her arrival in the emergency room.  She denies any abdominal pain nausea vomiting chest pain, shortness breath, lightheadedness, dizziness. ? ?No other associated symptoms.  She denies any vaginal discharge or vaginal bleeding. ? ?  ? ?Home Medications ?Prior to Admission medications   ?Medication Sig Start Date End Date Taking? Authorizing Provider  ?acetaminophen (TYLENOL) 500 MG tablet Take 500 mg by mouth every 6 (six) hours as needed (for pain.).    [provider]  ?atorvastatin (LIPITOR) 20 MG tablet Take 1 tablet (20 mg total) by mouth daily. 10/17/21   Charlott Rakes, MD  ?calcium-vitamin D (OSCAL WITH D) 500-200 MG-UNIT TABS tablet Take 1 tablet by mouth daily.     [provider]  ?dofetilide (TIKOSYN) 250 MCG capsule Take 1 capsule by mouth twice daily 02/17/22   Sherren Mocha, MD  ?ELIQUIS 5 MG TABS tablet TAKE 1 TABLET BY MOUTH EVERY 12 HOURS 08/10/21   Fenton, [place] R, PA  ?furosemide (LASIX) 40 MG tablet Take 1 tablet by mouth once daily 09/23/21   Sherren Mocha, MD  ?hydrALAZINE (APRESOLINE) 50 MG tablet TAKE 1 TABLET BY MOUTH THREE TIMES DAILY 12/18/21   Charlott Rakes, MD  ?loratadine (CLARITIN) 10 MG tablet Take 10 mg by mouth daily as needed for allergies.    [provider]  ?metoprolol tartrate (LOPRESSOR) 25 MG tablet Take 1 tablet (25 mg total) by mouth 2 (two) times daily. 03/17/22 03/12/23  Allred, Jeneen Rinks, MD  ?mometasone (ELOCON) 0.1 %  ointment Apply topically as needed (for skin rash).    [provider]  ?nitroGLYCERIN (NITROSTAT) 0.4 MG SL tablet Place 1 tablet (0.4 mg total) under the tongue every 5 (five) minutes as needed for chest pain. 07/24/17   Alycia Rossetti, MD  ?Olopatadine HCl 0.2 % SOLN Apply 1 drop to eye daily. One drop to affected eye once daily 03/24/21   Charlott Rakes, MD  ?Omega-3 1000 MG CAPS Take 1,000 mg by mouth daily.     [provider]  ?VITAMIN D PO Take 1 tablet by mouth daily.    [provider]  ?   ? ?Allergies    ?Patient has no known allergies.   ? ?Review of Systems   ?Review of Systems ? ?Physical Exam ?Updated Vital Signs ?BP 121/69   Pulse 77   Temp (!) 97.4 ?F (36.3 ?C) (Oral)   Resp 11   Ht 5' 6.5" (1.689 m)   Wt 80.7 kg   SpO2 100%   BMI 28.30 kg/m?  ?Physical Exam ?Vitals and nursing note reviewed.  ?Constitutional:   ?   General: She is not in acute distress. ?HENT:  ?   Head: Normocephalic and atraumatic.  ?   Nose: Nose normal.  ?   Mouth/Throat:  ?   Mouth: Mucous membranes are moist.  ?Eyes:  ?   General: No scleral icterus. ?Cardiovascular:  ?   Rate and Rhythm: Normal rate  and regular rhythm.  ?   Pulses: Normal pulses.  ?   Heart sounds: Normal heart sounds.  ?Pulmonary:  ?   Effort: Pulmonary effort is normal. No respiratory distress.  ?   Breath sounds: No wheezing.  ?Abdominal:  ?   Palpations: Abdomen is soft.  ?   Tenderness: There is no abdominal tenderness. There is no guarding or rebound.  ?Genitourinary: ?   Comments: Maroon stool on exam ?Musculoskeletal:  ?   Cervical back: Normal range of motion.  ?   Right lower leg: No edema.  ?   Left lower leg: No edema.  ?Skin: ?   General: Skin is warm and dry.  ?   Capillary Refill: Capillary refill takes less than 2 seconds.  ?Neurological:  ?   Mental Status: She is alert. Mental status is at baseline.  ?Psychiatric:     ?   Mood and Affect: Mood normal.     ?   Behavior: Behavior normal.  ? ? ?ED Results /  Procedures / Treatments   ?Labs ?(all labs ordered are listed, but only abnormal results are displayed) ?Labs Reviewed  ?CBC - Abnormal; Notable for the following components:  ?    Result Value  ? WBC 11.4 (*)   ? All other components within normal limits  ?POC OCCULT BLOOD, ED - Abnormal; Notable for the following components:  ? Fecal Occult Bld POSITIVE (*)   ? All other components within normal limits  ?PROTIME-INR  ?COMPREHENSIVE METABOLIC PANEL  ?LIPASE, BLOOD  ?TYPE AND SCREEN  ?ABO/RH  ? ? ?EKG ?None ? ?Radiology ?No results found. ? ?Procedures ?Procedures  ? ? ?Medications Ordered in ED ?Medications  ?pantoprazole (PROTONIX) injection 40 mg (has no administration in time range)  ? ? ?ED Course/ Medical Decision Making/ A&P ?  ?                        ?Medical Decision Making ?Amount and/or Complexity of Data Reviewed ?Labs: ordered. ? ? ?This patient presents to the ED for concern of rectal bleeding, this involves a number of treatment options, and is a complaint that carries with it a moderate to high risk of complications and morbidity.  ? ? ?Co morbidities: ?Discussed in HPI ? ? ?Brief History: ? ?Patient is a 71 year old female with past medical history significant for PAF on Eliquis, CHF, hyperlipidemia, DM 2, obesity ? ?She is presented to emergency room today with complaints of 6 episodes of bloody stool.  States that these episodes all occurred between 4 AM this morning and her arrival in the emergency room.  She denies any abdominal pain nausea vomiting chest pain, shortness breath, lightheadedness, dizziness. ? ?No other associated symptoms.  She denies any vaginal discharge or vaginal bleeding. ? ? ? ? ? ?EMR reviewed including pt PMHx, past surgical history and past visits to ER.  ? ?See HPI for more details ? ? ?Lab Tests: ? ? ?Maroon stool with fecal occult positive finding ?CBC unremarkable apart from mild leukocytosis.  Coags within normal limits type and screen ordered CMP lipase pending  4:04 PM  ? ? ?Imaging Studies: ? ?No imaging studies ordered for this patient ? ? ? ?Cardiac Monitoring: ? ?NA ?NA ? ? ?Medicines ordered: ? ?Pantoprazole ? ? ?Critical Interventions: ? ? ? ? ?Consults/Attending Physician ? ? ? ? ? ?Reevaluation: ? ?After the interventions noted above I re-evaluated patient and found that they have :stayed the same ? ? ?  Social Determinants of Health: ? ?The patient's social determinants of health were a factor in the care of this patient ? ? ? ?Problem List / ED Course: ? ?GI bleed ?On Eliquis, 71 years old.  Not anemic and vital signs within normal limits.  Will require admission.  [facility] called for consult awaiting return call. ? ? ?Dispostion: ? ? ? ?Final Clinical Impression(s) / ED Diagnoses ?Final diagnoses:  ?Gastrointestinal hemorrhage, unspecified gastrointestinal hemorrhage type  ? ? ?Rx / DC Orders ?ED Discharge Orders   ? ? None  ? ?  ? ? ?  ?Tedd Sias, [place] ?03/27/22 1604 ? ?  ?Luna Fuse, MD ?04/07/22 [phone] ? ?

## 2022-03-27 NOTE — Hospital Course (Signed)
Mrs. Mellette is a 71 y.o. 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 ? ?Patient was in her USOH until last night, woke up to have a bowel movement and it was bloody.  She describes it as "bright red formed stool".  Had several more BMs, reports her son later saw one said it was "like spinach".   ? ?Finally, this morning, she was still having BM, and she was too weak to get up, so she called EMS. ? ?In the ER, Hgb 13.2, BP 113/74 (relative to baseline elevated).  Cr and electrolytes normal.  DRE showed "maroon" stool per EDP.  Started on Protonix and admitted for GI bleed. ?

## 2022-03-27 NOTE — Assessment & Plan Note (Addendum)
-   Hold Eliquis ?- Continue Tikosyn and metoprolol ? ?

## 2022-03-27 NOTE — Assessment & Plan Note (Signed)
Cessation recommended, modalities discussed 

## 2022-03-27 NOTE — Assessment & Plan Note (Addendum)
Previous EF 40 to 45%, more recently has improved to 55%.  Grade II DD ?- Stop IVF ?BP still soft so: ?- Hold furosemide, hydralazine  ?- Continue atorvastatin ? ?

## 2022-03-27 NOTE — Assessment & Plan Note (Addendum)
No blood in BM overnight.  Hgb stable at 12.   ?Hemodynamics improved.  GI panel rules out campylobacter, salmonella.  No cramps or fever anyway. ?Colonoscopy 2007 with mild diverticulosis.  GI suspect diverticular bleed. ?- Hold Eliquis ?- Stop PPI ?- Clears ?- Consult GI, plan for colonoscopy after Eliquis washout ?  ?

## 2022-03-27 NOTE — Telephone Encounter (Signed)
?  Chief Complaint: Hematochezia ?Symptoms: Bright red blood in stools 9-xs since last night. "Quite a bit." ?Frequency: Last night into today, dizziness, no energy, weak.Mixed in stools, loose stools.  ?Pertinent Negatives: Patient denies  ?Disposition: '[x]'$ ED /'[]'$ Urgent Care (no appt availability in office) / '[]'$ Appointment(In office/virtual)/ '[]'$  [facility]/ '[]'$ Home Care/ '[]'$ Refused Recommended Disposition /'[]'$ [facility]/ '[]'$  Follow-up with PCP ?Additional Notes: Advised ED, states will follow disposition. ?Reason for Disposition ? Taking Coumadin (warfarin) or other strong blood thinner, or known bleeding disorder (e.g., thrombocytopenia) ?   Lightheaded ? ?Answer Assessment - Initial Assessment Questions ?1. APPEARANCE of BLOOD: "What color is it?" "Is it passed separately, on the surface of the stool, or mixed in with the stool?"  ?    Bright red mixed in with stool ?2. AMOUNT: "How much blood was passed?"  ?    "Quite a bit" ?3. FREQUENCY: "How many times has blood been passed with the stools?"  ?    9 or 10 times ?4. ONSET: "When was the blood first seen in the stools?" (Days or weeks)  ?    Last night ?5. DIARRHEA: "Is there also some diarrhea?" If Yes, ask: "How many diarrhea stools in the past 24 hours?"  ?    Yes ?6. CONSTIPATION: "Do you have constipation?" If Yes, ask: "How bad is it?" ?    no ?7. RECURRENT SYMPTOMS: "Have you had blood in your stools before?" If Yes, ask: "When was the last time?" and "What happened that time?"  ?    No ?8. BLOOD THINNERS: "Do you take any blood thinners?" (e.g., Coumadin/warfarin, Pradaxa/dabigatran, aspirin) ?    Eliquis ?9. OTHER SYMPTOMS: "Do you have any other symptoms?"  (e.g., abdomen pain, vomiting, dizziness, fever) ?Lightheaded, dizzy, abdominal pain. ? ?Protocols used: Rectal Bleeding-A-AH ? ?

## 2022-03-27 NOTE — H&P (Signed)
?History and Physical  ? ? ?Patient: Tracy James TZG:[id number] DOB: 11/18/1951 ?DOA: 03/27/2022 ?DOS: the patient was seen and examined on 03/27/2022 ?PCP: Charlott Rakes, MD  ?Patient coming from: Home ? ?Chief Complaint:  ?Chief Complaint  ?Patient presents with  ? GI Bleeding  ? ? ? ? ? ?HPI:  ?Mrs. Kalbfleisch is a 71 y.o. 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 ? ?Patient was in her USOH until last night, woke up to have a bowel movement and it was bloody.  She describes it as "bright red formed stool".  Had several more BMs, reports her son later saw one said it was "like spinach".   ? ?Finally, this morning, she was still having BM, and she was too weak to get up, so she called EMS. ? ?In the ER, Hgb 13.2, BP 113/74 (relative to baseline elevated).  Cr and electrolytes normal.  DRE showed "maroon" stool per EDP.  Started on Protonix and admitted for GI bleed. ?  ? ? ? ?Review of Systems  ?Constitutional:  Positive for malaise/fatigue. Negative for chills and fever.  ?Gastrointestinal:  Positive for blood in stool, diarrhea and melena. Negative for abdominal pain, constipation, heartburn, nausea and vomiting.  ?Neurological:  Positive for weakness. Negative for dizziness, tingling, focal weakness, seizures, loss of consciousness and headaches.   ? ? ? ? ?Past Medical History:  ?Diagnosis Date  ? Aortic stenosis   ? Echo 06/2019: mod AS, mean 28 mmHg, peak velocity 3.6 m/s // Echo 9/21: EF 60-65 no RWMA, moderate LVH, normal RVSF, trivial MR, trivial AI, moderate aortic stenosis (mean gradient 36 mmHg; V-max 3.64 m/s; DI 0.18) [reviewed with Dr. Burt Knack - mod AS; rpt echo 1 yr]    ? CHF (congestive heart failure) ([place])   ? Chronic systolic and diastolic  ? CKD (chronic kidney disease)   ? CKD IIIb  ? Hypertension   ? Nonischemic cardiomyopathy   ? Tachy induced CM // TEE 12/2018: EF 30-35 // Echo  06/2019: EF 55-60 // Cor CTA 8/18: Ca score 0; no CAD  ? Persistent atrial fibrillation   ? Admx 12/19 w AF w RVR // R+L atrial thrombus on TEE - DCCV canceled; repeat TEE in 01/2019 w some residual clot // Dofetilide Rx started 07/2019 >> NSR restored without DCCV // CHADS-VASc 4 (6 if include LAA clot) >> Apixaban   ? ?Past Surgical History:  ?Procedure Laterality Date  ? TEE WITHOUT CARDIOVERSION N/A 12/06/2018  ? Procedure: TRANSESOPHAGEAL ECHOCARDIOGRAM (TEE);  Surgeon: Lelon Perla, MD;  Location: [facility] ENDOSCOPY;  Service: Cardiovascular;  Laterality: N/A;  ? TEE WITHOUT CARDIOVERSION N/A 01/20/2019  ? Procedure: TRANSESOPHAGEAL ECHOCARDIOGRAM (TEE);  Surgeon: [name], MD;  Location: [facility] ENDOSCOPY;  Service: Cardiovascular;  Laterality: N/A;  ? TONSILLECTOMY    ? ?Social History:  reports that she has been smoking cigarettes. She has never used smokeless tobacco. She reports current alcohol use of about 1.0 standard drink per week. She reports that she does not use drugs. ? ?No Known Allergies ? ?Family History  ?Problem Relation Age of Onset  ? Arthritis Mother   ? Cancer Mother   ?     colon cancer  ? Diabetes Mother   ? Hypertension Mother   ? Heart murmur Mother   ? Early death Brother   ? Heart murmur Brother   ? Lung cancer  Brother   ? Arthritis Maternal Grandmother   ? Heart disease Maternal Grandmother   ?     massive heart attack  ? Heart disease Paternal Grandmother   ? Asthma Brother   ? Diabetes Brother   ? Hypertension Brother   ? Heart murmur Brother   ? Heart disease Father   ?     anuersym- ruptered  ? ? ?Prior to Admission medications   ?Medication Sig Start Date End Date Taking? Authorizing Provider  ?acetaminophen (TYLENOL) 500 MG tablet Take 500 mg by mouth every 6 (six) hours as needed (for pain.).    [provider]  ?atorvastatin (LIPITOR) 20 MG tablet Take 1 tablet (20 mg total) by mouth daily. 10/17/21   Charlott Rakes, MD  ?calcium-vitamin D (OSCAL WITH D) 500-200  MG-UNIT TABS tablet Take 1 tablet by mouth daily.     [provider]  ?dofetilide (TIKOSYN) 250 MCG capsule Take 1 capsule by mouth twice daily 02/17/22   Sherren Mocha, MD  ?ELIQUIS 5 MG TABS tablet TAKE 1 TABLET BY MOUTH EVERY 12 HOURS 08/10/21   Fenton, Hammond R, PA  ?furosemide (LASIX) 40 MG tablet Take 1 tablet by mouth once daily 09/23/21   Sherren Mocha, MD  ?hydrALAZINE (APRESOLINE) 50 MG tablet TAKE 1 TABLET BY MOUTH THREE TIMES DAILY 12/18/21   Charlott Rakes, MD  ?loratadine (CLARITIN) 10 MG tablet Take 10 mg by mouth daily as needed for allergies.    [provider]  ?metoprolol tartrate (LOPRESSOR) 25 MG tablet Take 1 tablet (25 mg total) by mouth 2 (two) times daily. 03/17/22 03/12/23  Allred, Jeneen Rinks, MD  ?mometasone (ELOCON) 0.1 % ointment Apply topically as needed (for skin rash).    [provider]  ?nitroGLYCERIN (NITROSTAT) 0.4 MG SL tablet Place 1 tablet (0.4 mg total) under the tongue every 5 (five) minutes as needed for chest pain. 07/24/17   Alycia Rossetti, MD  ?Olopatadine HCl 0.2 % SOLN Apply 1 drop to eye daily. One drop to affected eye once daily 03/24/21   Charlott Rakes, MD  ?Omega-3 1000 MG CAPS Take 1,000 mg by mouth daily.     [provider]  ?VITAMIN D PO Take 1 tablet by mouth daily.    [provider]  ? ? ?Physical Exam: ?Vitals:  ? 03/27/22 1515 03/27/22 1530 03/27/22 1545 03/27/22 1600  ?BP: 105/77 117/73 113/80 104/73  ?Pulse: 80 77 69 74  ?Resp: '12 17 14 12  '$ ?Temp:      ?TempSrc:      ?SpO2: 99% 97% 100% 96%  ?Weight:      ?Height:      ? ?Adult female, lying flat in gurney, interactive, appropriate, appears tired ?Anicteric, conjunctive a pink, lids and lashes normal, no nasal deformity, discharge, or epistaxis.  Oropharynx moist, torus in the roof of the mouth, no oral lesions, tongue normal, dentition in good repair ?Neck normal, trachea midline, no neck masses ?Heart rate regular, no murmurs, no JVD, no lower extremity  edema ?Respiratory rate normal, lungs clear without rales or wheezes ?Abdomen soft without tenderness palpation in all quadrants, no ascites or distention, no masses appreciated, no hepatosplenomegaly ?Attention normal, affect blunted by sleepiness, cranial nerves III through XII intact, moves upper extremities with generalized weakness but symmetric strength, recall, recent and remote seems normal, oriented to person, place, and time speech fluent ? ? ?Data Reviewed: ?Discussed with emergency room physician.  Prior echo reviewed ?Basic metabolic panel shows electrolytes normal, renal function  at baseline ?Hepatic function panel normal ?Hemogram was normal hemoglobin ?INR normal ? ? ? ? ? ? ?Assessment and Plan: ?* GI bleed ?Inconsistent descriptions of maroon versus "dark, spinach" vs bright red.  Source unclear. ?- Hold Eliquis ?- Continue PPI ?- N.p.o. ?- Consult GI ?- Trend hemoglobin overnight ?- Check for salmonella, campylobacter ? ?Smoking ?Cessation recommended, modalities discussed ? ?Secondary hypercoagulable state ([place]) ?- Hold Eliquis ? ?Chronic kidney disease, stage 3b ([place]) ?Baseline creatinine 1.5 ? ?Chronic systolic CHF (congestive heart failure) ([place]) ?Previous EF 40 to 45%, more recently has improved to 55%.  Grade II DD ?- Hold furosemide ?- Hold hydralazine ?- Continue atorvastatin ? ? ?Persistent atrial fibrillation ([place]) ?- Hold Eliquis ?- Continue Tikosyn and metoprolol ? ? ?Aortic stenosis ?Gradient 34, valve area 0.5 ? ?Overweight (BMI 25.0-29.9) ?BMI 28 ? ?Essential hypertension ?Blood pressure soft.  EMS reported her standing blood pressure was in the 90s, improved to the 110s with lying down. ?- Hold hydralazine, furosemide until hemodynamics clearer ?- Continue metoprolol ? ? ? ? ? ? ? ? ? ?Advance Care Planning: FULL ? ?Consults: GI, [place], called by EDP, will see in AM ? ?Family Communication: Daughter by phone ? ?Severity of Illness: ?The appropriate patient status for this patient  is OBSERVATION. Observation status is judged to be reasonable and necessary in order to provide the required intensity of service to ensure the patient's safety. The patient's presenting symptoms, physical exam f

## 2022-03-27 NOTE — Assessment & Plan Note (Signed)
BMI 28 ?

## 2022-03-27 NOTE — Assessment & Plan Note (Addendum)
Baseline creatinine 1.5 ?Cr slightly up to 1.7 today ?

## 2022-03-27 NOTE — Assessment & Plan Note (Signed)
Hold Eliquis. ?

## 2022-03-27 NOTE — Assessment & Plan Note (Addendum)
Asymptomatic.   ?Gradient 34, valve area 0.5 ?

## 2022-03-28 DIAGNOSIS — T45515A Adverse effect of anticoagulants, initial encounter: Secondary | ICD-10-CM | POA: Diagnosis present

## 2022-03-28 DIAGNOSIS — Z825 Family history of asthma and other chronic lower respiratory diseases: Secondary | ICD-10-CM | POA: Diagnosis not present

## 2022-03-28 DIAGNOSIS — I110 Hypertensive heart disease with heart failure: Secondary | ICD-10-CM | POA: Diagnosis not present

## 2022-03-28 DIAGNOSIS — I4819 Other persistent atrial fibrillation: Secondary | ICD-10-CM | POA: Diagnosis present

## 2022-03-28 DIAGNOSIS — Z8261 Family history of arthritis: Secondary | ICD-10-CM | POA: Diagnosis not present

## 2022-03-28 DIAGNOSIS — E785 Hyperlipidemia, unspecified: Secondary | ICD-10-CM | POA: Diagnosis present

## 2022-03-28 DIAGNOSIS — K5731 Diverticulosis of large intestine without perforation or abscess with bleeding: Secondary | ICD-10-CM | POA: Diagnosis present

## 2022-03-28 DIAGNOSIS — Z833 Family history of diabetes mellitus: Secondary | ICD-10-CM | POA: Diagnosis not present

## 2022-03-28 DIAGNOSIS — Z8249 Family history of ischemic heart disease and other diseases of the circulatory system: Secondary | ICD-10-CM | POA: Diagnosis not present

## 2022-03-28 DIAGNOSIS — E1122 Type 2 diabetes mellitus with diabetic chronic kidney disease: Secondary | ICD-10-CM | POA: Diagnosis present

## 2022-03-28 DIAGNOSIS — Z79899 Other long term (current) drug therapy: Secondary | ICD-10-CM | POA: Diagnosis not present

## 2022-03-28 DIAGNOSIS — I5022 Chronic systolic (congestive) heart failure: Secondary | ICD-10-CM | POA: Diagnosis present

## 2022-03-28 DIAGNOSIS — D62 Acute posthemorrhagic anemia: Secondary | ICD-10-CM | POA: Diagnosis not present

## 2022-03-28 DIAGNOSIS — I4510 Unspecified right bundle-branch block: Secondary | ICD-10-CM | POA: Diagnosis present

## 2022-03-28 DIAGNOSIS — Z801 Family history of malignant neoplasm of trachea, bronchus and lung: Secondary | ICD-10-CM | POA: Diagnosis not present

## 2022-03-28 DIAGNOSIS — Z7901 Long term (current) use of anticoagulants: Secondary | ICD-10-CM | POA: Diagnosis not present

## 2022-03-28 DIAGNOSIS — D120 Benign neoplasm of cecum: Secondary | ICD-10-CM | POA: Diagnosis present

## 2022-03-28 DIAGNOSIS — K635 Polyp of colon: Secondary | ICD-10-CM | POA: Diagnosis not present

## 2022-03-28 DIAGNOSIS — E876 Hypokalemia: Secondary | ICD-10-CM | POA: Diagnosis not present

## 2022-03-28 DIAGNOSIS — F17210 Nicotine dependence, cigarettes, uncomplicated: Secondary | ICD-10-CM | POA: Diagnosis present

## 2022-03-28 DIAGNOSIS — E669 Obesity, unspecified: Secondary | ICD-10-CM | POA: Diagnosis present

## 2022-03-28 DIAGNOSIS — I130 Hypertensive heart and chronic kidney disease with heart failure and stage 1 through stage 4 chronic kidney disease, or unspecified chronic kidney disease: Secondary | ICD-10-CM | POA: Diagnosis present

## 2022-03-28 DIAGNOSIS — Z6828 Body mass index (BMI) 28.0-28.9, adult: Secondary | ICD-10-CM | POA: Diagnosis not present

## 2022-03-28 DIAGNOSIS — K921 Melena: Secondary | ICD-10-CM

## 2022-03-28 DIAGNOSIS — K922 Gastrointestinal hemorrhage, unspecified: Secondary | ICD-10-CM | POA: Diagnosis present

## 2022-03-28 DIAGNOSIS — N1832 Chronic kidney disease, stage 3b: Secondary | ICD-10-CM | POA: Diagnosis present

## 2022-03-28 DIAGNOSIS — D6832 Hemorrhagic disorder due to extrinsic circulating anticoagulants: Secondary | ICD-10-CM | POA: Diagnosis present

## 2022-03-28 DIAGNOSIS — I350 Nonrheumatic aortic (valve) stenosis: Secondary | ICD-10-CM | POA: Diagnosis present

## 2022-03-28 DIAGNOSIS — E1165 Type 2 diabetes mellitus with hyperglycemia: Secondary | ICD-10-CM | POA: Diagnosis not present

## 2022-03-28 LAB — GASTROINTESTINAL PANEL BY PCR, STOOL (REPLACES STOOL CULTURE)

## 2022-03-28 LAB — BASIC METABOLIC PANEL
Anion gap: 6 (ref 5–15)
BUN: 22 mg/dL (ref 8–23)
CO2: 27 mmol/L (ref 22–32)
Calcium: 9.6 mg/dL (ref 8.9–10.3)
Chloride: 106 mmol/L (ref 98–111)
Creatinine, Ser: 1.78 mg/dL — ABNORMAL HIGH (ref 0.44–1.00)
GFR, Estimated: 31 mL/min — ABNORMAL LOW (ref >60)
Glucose, Bld: 145 mg/dL — ABNORMAL HIGH (ref 70–99)
Potassium: 3.7 mmol/L (ref 3.5–5.1)
Sodium: 139 mmol/L (ref 135–145)

## 2022-03-28 LAB — HEMOGLOBIN AND HEMATOCRIT, BLOOD
HCT: 39.2 % (ref 36.0–46.0)
HCT: 34 % — ABNORMAL LOW (ref 36.0–46.0)
Hemoglobin: 12.6 g/dL (ref 12.0–15.0)
Hemoglobin: 11.3 g/dL — ABNORMAL LOW (ref 12.0–15.0)

## 2022-03-28 LAB — HIV ANTIBODY (ROUTINE TESTING W REFLEX): HIV Screen 4th Generation wRfx: NONREACTIVE

## 2022-03-28 LAB — MAGNESIUM: Magnesium: 1.9 mg/dL (ref 1.7–2.4)

## 2022-03-28 MED ORDER — METOPROLOL TARTRATE 25 MG PO TABS
25.0000 mg | ORAL_TABLET | Freq: Two times a day (BID) | ORAL | Status: DC
  Administered 2022-03-28 – 2022-03-30 (×3): 25 mg via ORAL
  Filled 2022-03-28 (×3): qty 1

## 2022-03-28 MED ORDER — POTASSIUM CHLORIDE CRYS ER 20 MEQ PO TBCR: 40.0000 meq | EXTENDED_RELEASE_TABLET | Freq: Once | ORAL | Status: DC

## 2022-03-28 MED ORDER — ACETAMINOPHEN 650 MG RE SUPP: 650.0000 mg | Freq: Four times a day (QID) | RECTAL | Status: DC | PRN

## 2022-03-28 MED ORDER — ONDANSETRON HCL 4 MG/2ML IJ SOLN: 4.0000 mg | Freq: Four times a day (QID) | INTRAMUSCULAR | Status: DC | PRN

## 2022-03-28 MED ORDER — MAGNESIUM SULFATE 2 GM/50ML IV SOLN
2.0000 g | Freq: Once | INTRAVENOUS | Status: AC
  Administered 2022-03-28: 2 g via INTRAVENOUS
  Filled 2022-03-28: qty 50

## 2022-03-28 MED ORDER — ACETAMINOPHEN 325 MG PO TABS
650.0000 mg | ORAL_TABLET | Freq: Four times a day (QID) | ORAL | Status: DC | PRN
  Administered 2022-03-30: 650 mg via ORAL
  Filled 2022-03-28: qty 2

## 2022-03-28 MED ORDER — DOFETILIDE 250 MCG PO CAPS
250.0000 ug | ORAL_CAPSULE | Freq: Two times a day (BID) | ORAL | Status: DC
  Administered 2022-03-28: 250 ug via ORAL
  Filled 2022-03-28 (×2): qty 1

## 2022-03-28 MED ORDER — ONDANSETRON HCL 4 MG PO TABS: 4.0000 mg | ORAL_TABLET | Freq: Four times a day (QID) | ORAL | Status: DC | PRN

## 2022-03-28 MED ORDER — ATORVASTATIN CALCIUM 10 MG PO TABS
20.0000 mg | ORAL_TABLET | Freq: Every day | ORAL | Status: DC
Start: 2022-03-28 — End: 2022-03-30
  Administered 2022-03-29 – 2022-03-30 (×2): 20 mg via ORAL
  Filled 2022-03-28 (×2): qty 2

## 2022-03-28 MED ORDER — DOFETILIDE 250 MCG PO CAPS: 250.0000 ug | ORAL_CAPSULE | Freq: Two times a day (BID) | ORAL | Status: DC

## 2022-03-28 MED ORDER — DOFETILIDE 250 MCG PO CAPS
250.0000 ug | ORAL_CAPSULE | Freq: Two times a day (BID) | ORAL | Status: DC
  Administered 2022-03-28 – 2022-03-30 (×4): 250 ug via ORAL
  Filled 2022-03-28 (×5): qty 1

## 2022-03-28 NOTE — Progress Notes (Signed)
TR[facility] night cross cover note: ? ?I was contacted by our inpatient pharmacist who requested EKG prior to initiation of Tikosyn for history of atrial fibrillation in this patient that was admitted earlier in the day for acute gastrointestinal bleed.  I subsequently placed requested order for EKG which showed QTc of 500, prompting next dose of Tikosyn to be held.  Also ordered repeat EKG for the morning to further trend ensuing  Qtc interval.  ? ? ? ? ?Babs Bertin, DO ?Hospitalist ? ?

## 2022-03-28 NOTE — Progress Notes (Signed)
?Progress Note ? ? ?Patient: Tracy James WUJ:[id number] DOB: 27-Nov-1951 DOA: 03/27/2022     0 ?DOS: the patient was seen and examined on 03/28/2022 at 11:27AM ?  ? ? ? ?Brief hospital course: ?Tracy James is a 71 y.o. 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 ? ?Patient was in her USOH until last night, woke up to have a bowel movement and it was bloody.  She describes it as "bright red formed stool".  Had several more BMs, reports her son later saw one said it was "like spinach".   ? ?Finally, this morning, she was still having BM, and she was too weak to get up, so she called EMS. ? ?In the ER, Hgb 13.2, BP 113/74 (relative to baseline elevated).  Cr and electrolytes normal.  DRE showed "maroon" stool per EDP.  Started on Protonix and admitted for GI bleed. ? ? ? ? ? ? ? ?Assessment and Plan: ?* GI bleed ?No blood in BM overnight.  Hgb stable at 12.   ?Hemodynamics improved.  GI panel rules out campylobacter, salmonella.  No cramps or fever anyway. ?Colonoscopy 2007 with mild diverticulosis.  GI suspect diverticular bleed. ?- Hold Eliquis ?- Stop PPI ?- Clears ?- Consult GI, plan for colonoscopy after Eliquis washout ?  ? ?Smoking ?Cessation recommended, modalities discussed ? ?Secondary hypercoagulable state (Tracy James) ?- Hold Eliquis ? ?Chronic kidney disease, stage 3b (Tracy James) ?Baseline creatinine 1.5 ?Cr slightly up to 1.7 today ? ?Chronic systolic CHF (congestive heart failure) (Tracy James) ?Previous EF 40 to 45%, more recently has improved to 55%.  Grade II DD ?- Stop IVF ?BP still soft so: ?- Hold furosemide, hydralazine  ?- Continue atorvastatin ? ? ?Persistent atrial fibrillation (Tracy James) ?- Hold Eliquis ?- Continue Tikosyn and metoprolol ? ? ?Aortic stenosis ?Asymptomatic.   ?Gradient 34, valve area 0.5 ? ?Overweight (BMI 25.0-29.9) ?BMI 28 ? ?Essential hypertension ?Blood pressure soft on arrival.   Now  controlled only on metoprolol. ?- Hold hydralazine, furosemide for now ?- Continue metoprolol ? ? ? ? ? ? ? ? ? ? ?Subjective: Feels better.  One BM overnight, no further blood.  Maybe "mixed in"?  But no clots.  No significant blood. No melena.  BP normal.  NO abdominal pain or vomiting. ? ? ? ? ?Physical Exam: ?Vitals:  ? 03/28/22 0730 03/28/22 0800 03/28/22 0830 03/28/22 1100  ?BP: 119/84 127/71 119/78 115/69  ?Pulse: 84 79 87 62  ?Resp: '11 11 13 14  '$ ?Temp:  97.8 ?F (36.6 ?C)    ?TempSrc:  Oral    ?SpO2: 99% 98% 99% 98%  ?Weight:      ?Height:      ? ?Elderly adult female, lying in bed, no acute distress. ?RRR, no murmurs, no peripheral edema ?Respiratory rate normal, lungs clear without rales or wheezes ?Abdomen soft no tenderness palpation or guarding, no ascites or distention ?Attention normal, affect appropriate, judgment and insight appear normal, speech fluent, ? ?Data Reviewed: ?GI notes reviewed, basic metabolic panel and vital signs reviewed, hemoglobin reviewed ? ?Family Communication: Son and brother at the bedside ? ? ? ?Disposition: ?Status is: Inpatient ?Remains inpatient appropriate because: She was admitted with GI bleeding, because she takes Eliquis and would not have a colonoscopy, she will require 48 hours Eliquis washout. ? ?GI plan to do endoscopy tomorrow or the next day, dispo after that ? ? ? ? ? ? ? ? ? ? ? ?  Author: ?Edwin Dada, MD ?03/28/2022 2:35 PM ? ?For on call review [email or web address].  ? ? ?

## 2022-03-28 NOTE — Consult Note (Addendum)
[facility] Consult: 8:22 AM 03/28/2022  LOS: 0 days    Referring Provider: Dr Maryfrances Bunnell  Primary Care Physician:  Hoy Register, MD Primary Gastroenterologist:  Dr. Lina Sar   Reason for Consultation: [facility], FOBT positive stool.  Chronic apixaban.   HPI: Tracy James is a 71 y.o. female.  PMH below.  On apixaban for A-fib.  Last dose 03/27/2022. Latest Echo 3/21/202 with LVEF 55 to 60%.  Mild reduction right ventricular systolic function.  Moderate calcification of tricuspid aortic valve with trivial regurg.  Severe aortic stenosis. 07/2006 colonoscopy.  Listed as average risk screening, however her mother was diagnosed and died with colon cancer at age 32.  Removed a couple of sessile polyps in the sigmoid size 3 mm or less.  Path: HP.  Mild sigmoid diverticulosis. Negative Cologuard testing in 2022 and 2023. Has pelvic MRI scheduled for mid June to evaluate ovarian cyst.  MR in mid December 2022 showed uterine fibroids, right posterior adnexal cyst, sigmoid diverticulosis. Cardiac issues stable as of office visit with Dr. Johney Frame on 03/17/2022. No PPI, H2 blocker at home.  Presented to ED yesterday afternoon.  Had Posta on Sunday evening.  Late that night, getting up from bed she had a bowel movement which she thought might have looked dark/bloody.  Repeated bowel movement after awakening in the morning was also maroon looking.  Subsequently has had about 6 of these that became more grossly bloody.  Last bowel movement which was smaller and less bloody occurred about 4 AM today Tuesday..   Was feeling weak, dizzy, near syncopal and having difficulty getting up after a bowel movement.  No nausea, vomiting, abdominal pain.  Had had a normal brown stool Sunday morning.  Hypotensive at arrival.  Providers  initiated empiric Protonix 40 IV q 12.    Hgb 13.2 at 2 PM, 12.6 just after midnight.  MCV, platelets, BUN normal.  No cardiac enzyme assay thus far.  Patient does not have unusual or excessive bleeding generally.  Not taking NSAIDs or aspirin products.  Does not drink or smoke cigarettes. Living in [place].  Her son has been staying with her for about a month.  Past Medical History:  Diagnosis Date  . Aortic stenosis    Echo 06/2019: mod AS, mean 28 mmHg, peak velocity 3.6 m/s // Echo 9/21: EF 60-65 no RWMA, moderate LVH, normal RVSF, trivial MR, trivial AI, moderate aortic stenosis (mean gradient 36 mmHg; V-max 3.64 m/s; DI 0.18) [reviewed with Dr. Excell Seltzer - mod AS; rpt echo 1 yr]    . CHF (congestive heart failure) (HCC)    Chronic systolic and diastolic  . CKD (chronic kidney disease)    CKD IIIb  . Hypertension   . Nonischemic cardiomyopathy    Tachy induced CM // TEE 12/2018: EF 30-35 // Echo 06/2019: EF 55-60 // Cor CTA 8/18: Ca score 0; no CAD  . Persistent atrial fibrillation    Admx 12/19 w AF w RVR // R+L atrial thrombus on TEE - DCCV canceled; repeat TEE in 01/2019 w some residual  clot // Dofetilide Rx started 07/2019 >> NSR restored without DCCV // CHADS-VASc 4 (6 if include LAA clot) >> Apixaban     Past Surgical History:  Procedure Laterality Date  . TEE WITHOUT CARDIOVERSION N/A 12/06/2018   Procedure: TRANSESOPHAGEAL ECHOCARDIOGRAM (TEE);  Surgeon: Lewayne Bunting, MD;  Location: [facility] ENDOSCOPY;  Service: Cardiovascular;  Laterality: N/A;  . TEE WITHOUT CARDIOVERSION N/A 01/20/2019   Procedure: TRANSESOPHAGEAL ECHOCARDIOGRAM (TEE);  Surgeon: Parke Poisson, MD;  Location: [facility] ENDOSCOPY;  Service: Cardiovascular;  Laterality: N/A;  . TONSILLECTOMY      Prior to Admission medications   Medication Sig Start Date End Date Taking? Authorizing Provider  acetaminophen (TYLENOL) 500 MG tablet Take 500 mg by mouth every 6 (six) hours as needed (for pain.).   Yes [provider]  atorvastatin (LIPITOR) 20 MG tablet Take 1 tablet (20 mg total) by mouth daily. 10/17/21  Yes Hoy Register, MD  calcium-vitamin D (OSCAL WITH D) 500-200 MG-UNIT TABS tablet Take 1 tablet by mouth daily.    Yes [provider]  dofetilide (TIKOSYN) 250 MCG capsule Take 1 capsule by mouth twice daily Patient taking differently: Take 250 mcg by mouth 2 (two) times daily. 02/17/22  Yes Tonny Bollman, MD  ELIQUIS 5 MG TABS tablet TAKE 1 TABLET BY MOUTH EVERY 12 HOURS Patient taking differently: Take 5 mg by mouth 2 (two) times daily. 08/10/21  Yes Fenton, Clint R, PA  furosemide (LASIX) 40 MG tablet Take 1 tablet by mouth once daily Patient taking differently: Take 40 mg by mouth daily. 09/23/21  Yes Tonny Bollman, MD  hydrALAZINE (APRESOLINE) 50 MG tablet TAKE 1 TABLET BY MOUTH THREE TIMES DAILY Patient taking differently: Take 50 mg by mouth 3 (three) times daily. 12/18/21  Yes Hoy Register, MD  loratadine (CLARITIN) 10 MG tablet Take 10 mg by mouth daily as needed for allergies.   Yes [provider]  metoprolol tartrate (LOPRESSOR) 25 MG tablet Take 1 tablet (25 mg total) by mouth 2 (two) times daily. 03/17/22 03/12/23 Yes Allred, Fayrene Fearing, MD  mometasone (ELOCON) 0.1 % ointment Apply 1 application. topically daily as needed (for skin rash).   Yes [provider]  nitroGLYCERIN (NITROSTAT) 0.4 MG SL tablet Place 1 tablet (0.4 mg total) under the tongue every 5 (five) minutes as needed for chest pain. 07/24/17  Yes Jakin, Velna Hatchet, MD  Olopatadine HCl 0.2 % SOLN Apply 1 drop to eye daily. One drop to affected eye once daily 03/24/21  Yes Newlin, Enobong, MD  Omega-3 1000 MG CAPS Take 1,000 mg by mouth daily.    Yes [provider]  VITAMIN D PO Take 1 tablet by mouth daily.   Yes [provider]    Scheduled Meds: . atorvastatin  20 mg Oral Daily  . dofetilide  250 mcg Oral BID  . metoprolol tartrate  25 mg Oral BID  . pantoprazole  (PROTONIX) IV  40 mg Intravenous Q12H  . potassium chloride  40 mEq Oral Once   Infusions: . dextrose 5% lactated ringers 100 mL/hr at 03/27/22 1801  . magnesium sulfate bolus IVPB     PRN Meds: acetaminophen **OR** acetaminophen   Allergies as of 03/27/2022  . (No Known Allergies)    Family History  Problem Relation Age of Onset  . Arthritis Mother   . Cancer Mother        colon cancer  . Diabetes Mother   . Hypertension Mother   . Heart murmur Mother   .  Early death Brother   . Heart murmur Brother   . Lung cancer Brother   . Arthritis Maternal Grandmother   . Heart disease Maternal Grandmother        massive heart attack  . Heart disease Paternal Grandmother   . Asthma Brother   . Diabetes Brother   . Hypertension Brother   . Heart murmur Brother   . Heart disease Father        anuersym- [id number]    Social History   Socioeconomic History  . Marital status: Divorced    Spouse name: Not on file  . Number of children: Not on file  . Years of education: Not on file  . Highest education level: Not on file  Occupational History  . Not on file  Tobacco Use  . Smoking status: Every Day    Types: Cigarettes    Last attempt to quit: 12/05/2011    Years since quitting: 10.3  . Smokeless tobacco: Never  . Tobacco comments:       Vaping Use  . Vaping Use: Never used  Substance and Sexual Activity  . Alcohol use: Yes    Alcohol/week: 1.0 standard drink    Types: 1 Glasses of wine per week    Comment: occasionally  . Drug use: No  . Sexual activity: Not Currently    Birth control/protection: None  Other Topics Concern  . Not on file  Social History Narrative  . Not on file   Social Determinants of Health   Financial Resource Strain: Not on file  Food Insecurity: Not on file  Transportation Needs: Not on file  Physical Activity: Not on file  Stress: Not on file  Social Connections: Not on file  Intimate Partner Violence: Not on file    REVIEW OF  SYSTEMS: Constitutional: Weakness, lightheadedness improved. ENT:  No nose bleeds Pulm: No shortness of breath or cough CV:  No palpitations, no LE edema.  No chest pain GU:  No hematuria, no frequency GI: No dysphagia.  Good appetite. Heme: Per HPI. Transfusions: None Neuro: Presyncope, weakness, dizziness improved overnight.  No headaches, no peripheral tingling or numbness Derm:  No itching, no rash or sores.  Endocrine:  No sweats or chills.  No polyuria or dysuria Immunization: Immunization history reviewed. Travel:  None beyond local counties in last few months.    PHYSICAL EXAM: Vital signs in last 24 hours: Vitals:   03/28/22 0530 03/28/22 0600  BP: 110/81 124/67  Pulse: 81 80  Resp: 11 18  Temp:    SpO2: 99% 94%   Wt Readings from Last 3 Encounters:  03/27/22 80.7 kg  03/17/22 78.8 kg  02/21/22 80.2 kg    General: Pleasant, well-appearing, in no distress. Head: No facial asymmetry or swelling.  No signs of head trauma. Eyes: No scleral icterus or conjunctival pallor. Ears: Not hard of hearing Nose: No congestion or discharge Mouth: Good dentition.  Tongue midline.  Mucosa moist, pink, clear. Neck: No masses, thyromegaly or JVD Lungs: Clear lungs with no labored breathing. Heart: Irregularly irregular rhythm rate not slow or accelerated.  Soft systolic murmur. Abdomen: Soft, not tender or distended.  No HSM, masses, bruits, hernias.   Rectal: Deferred rectal exam. Musc/Skeltl: No joint redness, swelling or gross deformity. Extremities: No CCE.  Feet are warm and well-perfused. Neurologic: Good historian.  Moves all 4 limbs without weakness.  No tremors or gross deficits. Skin: No obvious hematomas, sores or rash Tattoos: None observed Nodes: No cervical adenopathy  Psych: Calm, pleasant, cooperative.  Fluid speech.  Intake/Output from previous day: No intake/output data recorded. Intake/Output this shift: No intake/output data recorded.  LAB  RESULTS: Recent Labs    03/27/22 1357 03/28/22 0045  WBC 11.4*  --   HGB 13.2 12.6  HCT 40.7 39.2  PLT 288  --    BMET Lab Results  Component Value Date   NA 139 03/28/2022   NA 141 03/27/2022   NA 140 01/20/2022   K 3.7 03/28/2022   K 4.1 03/27/2022   K 5.4 (H) 01/20/2022   CL 106 03/28/2022   CL 100 03/27/2022   CL 107 01/20/2022   CO2 27 03/28/2022   CO2 29 03/27/2022   CO2 25 01/20/2022   GLUCOSE 145 (H) 03/28/2022   GLUCOSE 124 (H) 03/27/2022   GLUCOSE 102 (H) 01/20/2022   BUN 22 03/28/2022   BUN 23 03/27/2022   BUN 30 (H) 01/20/2022   CREATININE 1.78 (H) 03/28/2022   CREATININE 1.63 (H) 03/27/2022   CREATININE 1.58 (H) 01/20/2022   CALCIUM 9.6 03/28/2022   CALCIUM 10.3 03/27/2022   CALCIUM 9.5 01/20/2022   LFT Recent Labs    03/27/22 1357  PROT 7.4  ALBUMIN 3.7  AST 24  ALT 22  ALKPHOS 96  BILITOT 1.3*   PT/INR Lab Results  Component Value Date   INR 1.2 03/27/2022   INR 0.99 07/25/2017   Lipase     Component Value Date/Time   LIPASE 29 03/27/2022 1357    Drugs of Abuse  No results found for: LABOPIA, COCAINSCRNUR, LABBENZ, AMPHETMU, THCU, LABBARB   RADIOLOGY STUDIES: No results found.   IMPRESSION:   GI bleed.  All indicators suggest lower GI bleed.  Suspect diverticular bleed.  Hyperglycemia with glucose 145.  Drop in Hgb of 0.5 g but not anemic.  Severe aortic stenosis, not symptomatic.    PLAN:     Clear liquid diet.  Stop IV Protonix.  Plan colonoscopy after Eliquis washout.  Anticipate date of colonoscopy 4/26 vs 4/27.  Dr. Russella Dar will be seeing patient today.   Jennye Moccasin  03/28/2022, 8:22 AM Phone [phone]    Attending Physician Note   I have taken a history, reviewed the chart and examined the patient. I performed a substantive portion of this encounter, including complete performance of at least one of the key components, in conjunction with the APP. I agree with the APP's note, impression and  recommendations with my edits. My additional impressions and recommendations are as follows.   *Acute painless hematochezia on apixaban. LGI bleed. R/O diverticular, AVM, neoplasm. Colonoscopy after apixaban wash out. Trend CBC. Clear liquid diet. Urgent CTA for recurrent bleeding.  *Severe aortic stenosis per 02/2022 echocardiogram *Afib on apixaban *CKD  Claudette Head, MD [facility] See AMION, [facility], for our on call provider

## 2022-03-28 NOTE — ED Notes (Signed)
Moderate amount of loose clay colored stool voided, pt cleaned, unable to make it to bedside commode ?

## 2022-03-29 DIAGNOSIS — K922 Gastrointestinal hemorrhage, unspecified: Secondary | ICD-10-CM | POA: Diagnosis not present

## 2022-03-29 DIAGNOSIS — Z7901 Long term (current) use of anticoagulants: Secondary | ICD-10-CM

## 2022-03-29 DIAGNOSIS — D62 Acute posthemorrhagic anemia: Secondary | ICD-10-CM | POA: Diagnosis not present

## 2022-03-29 DIAGNOSIS — K921 Melena: Secondary | ICD-10-CM | POA: Diagnosis not present

## 2022-03-29 DIAGNOSIS — N1832 Chronic kidney disease, stage 3b: Secondary | ICD-10-CM | POA: Diagnosis not present

## 2022-03-29 DIAGNOSIS — I5022 Chronic systolic (congestive) heart failure: Secondary | ICD-10-CM | POA: Diagnosis not present

## 2022-03-29 DIAGNOSIS — I350 Nonrheumatic aortic (valve) stenosis: Secondary | ICD-10-CM | POA: Diagnosis not present

## 2022-03-29 DIAGNOSIS — Z5181 Encounter for therapeutic drug level monitoring: Secondary | ICD-10-CM

## 2022-03-29 DIAGNOSIS — E876 Hypokalemia: Secondary | ICD-10-CM

## 2022-03-29 LAB — BASIC METABOLIC PANEL
Anion gap: 7 (ref 5–15)
BUN: 19 mg/dL (ref 8–23)
CO2: 29 mmol/L (ref 22–32)
Calcium: 9.2 mg/dL (ref 8.9–10.3)
Chloride: 103 mmol/L (ref 98–111)
Creatinine, Ser: 1.38 mg/dL — ABNORMAL HIGH (ref 0.44–1.00)
GFR, Estimated: 42 mL/min — ABNORMAL LOW (ref >60)
Glucose, Bld: 119 mg/dL — ABNORMAL HIGH (ref 70–99)
Potassium: 3.3 mmol/L — ABNORMAL LOW (ref 3.5–5.1)
Sodium: 139 mmol/L (ref 135–145)

## 2022-03-29 LAB — CBC
HCT: 33 % — ABNORMAL LOW (ref 36.0–46.0)
Hemoglobin: 10.8 g/dL — ABNORMAL LOW (ref 12.0–15.0)
MCH: 29.3 pg (ref 26.0–34.0)
MCHC: 32.7 g/dL (ref 30.0–36.0)
MCV: 89.7 fL (ref 80.0–100.0)
Platelets: 234 10*3/uL (ref 150–400)
RBC: 3.68 MIL/uL — ABNORMAL LOW (ref 3.87–5.11)
RDW: 15.2 % (ref 11.5–15.5)
WBC: 6.3 10*3/uL (ref 4.0–10.5)
nRBC: 0 % (ref 0.0–0.2)

## 2022-03-29 MED ORDER — POTASSIUM CHLORIDE CRYS ER 20 MEQ PO TBCR
40.0000 meq | EXTENDED_RELEASE_TABLET | ORAL | Status: AC
  Administered 2022-03-29 (×2): 40 meq via ORAL
  Filled 2022-03-29: qty 2

## 2022-03-29 MED ORDER — PEG-KCL-NACL-NASULF-NA ASC-C 100 G PO SOLR: 1.0000 | Freq: Once | ORAL | Status: DC

## 2022-03-29 MED ORDER — POTASSIUM CHLORIDE 20 MEQ PO PACK: 40.0000 meq | PACK | Freq: Four times a day (QID) | ORAL | Status: DC

## 2022-03-29 MED ORDER — PEG-KCL-NACL-NASULF-NA ASC-C 100 G PO SOLR
0.5000 | Freq: Once | ORAL | Status: AC
  Administered 2022-03-30: 100 g via ORAL

## 2022-03-29 MED ORDER — BISACODYL 5 MG PO TBEC
20.0000 mg | DELAYED_RELEASE_TABLET | Freq: Once | ORAL | Status: AC
Start: 2022-03-29 — End: 2022-03-29
  Administered 2022-03-29: 20 mg via ORAL
  Filled 2022-03-29: qty 4

## 2022-03-29 MED ORDER — METOCLOPRAMIDE HCL 5 MG/ML IJ SOLN
10.0000 mg | Freq: Once | INTRAMUSCULAR | Status: AC
  Administered 2022-03-29: 10 mg via INTRAVENOUS
  Filled 2022-03-29: qty 2

## 2022-03-29 MED ORDER — METOCLOPRAMIDE HCL 5 MG/ML IJ SOLN
10.0000 mg | Freq: Once | INTRAMUSCULAR | Status: AC
  Administered 2022-03-30: 10 mg via INTRAVENOUS
  Filled 2022-03-29: qty 2

## 2022-03-29 MED ORDER — PEG-KCL-NACL-NASULF-NA ASC-C 100 G PO SOLR
0.5000 | Freq: Once | ORAL | Status: AC
  Administered 2022-03-29: 100 g via ORAL
  Filled 2022-03-29: qty 1

## 2022-03-29 MED ORDER — POTASSIUM CHLORIDE 20 MEQ PO PACK: 40.0000 meq | PACK | ORAL | Status: AC

## 2022-03-29 MED ORDER — POTASSIUM CHLORIDE CRYS ER 20 MEQ PO TBCR
40.0000 meq | EXTENDED_RELEASE_TABLET | Freq: Four times a day (QID) | ORAL | Status: DC
  Filled 2022-03-29: qty 2

## 2022-03-29 NOTE — Progress Notes (Signed)
Pharmacy: Dofetilide (Tikosyn) - Follow Up ?Assessment and Electrolyte Replacement ? ?Pharmacy consulted to assist in monitoring and replacing electrolytes in this 71 y.o. female admitted on 03/27/2022 undergoing dofetilide re-initiation.   ? ?Labs: ?   ?Component Value Date/Time  ? K 3.3 (L) 03/29/2022 0110  ? MG 1.9 03/28/2022 0045  ?  ? ?Plan: ?Potassium: ?K < 3.5:  Give KCl 40 mEq q4hr x2  ? ?Magnesium: ?Mg 1.8-2: Give Mg 2 gm IV x1 (given 4/25) ?  ?F/u K and Mg on 4/27  ? ?Thank you for allowing pharmacy to participate in this patient's care  ? ?Benetta Spar, PharmD, BCPS, BCCP ?Clinical Pharmacist ? ?Please check AMION for all [place] phone numbers ?After 10:00 PM, call [place] [phone] ? ?

## 2022-03-29 NOTE — Progress Notes (Addendum)
? ?         Daily Rounding Note ? ?03/29/2022, 10:10 AM ? LOS: 1 day  ? ?SUBJECTIVE:   ?Chief complaint: Lower GI bleed.  Presumed diverticular.  Chronic Eliquis on hold. ? ?1 stool last night was brown with a few areas that were darker but no obvious blood.  Slight positional dizziness which resolves quickly.  Overall feels great. ? ?OBJECTIVE:        ? Vital signs in last 24 hours:    ?Temp:  [97.4 ?F (36.3 ?C)-98 ?F (36.7 ?C)] 97.8 ?F (36.6 ?C) (04/26 0719) ?Pulse Rate:  [62-80] 65 (04/26 0719) ?Resp:  [10-17] 16 (04/26 0719) ?BP: (113-132)/(62-90) 113/68 (04/26 0719) ?SpO2:  [98 %-100 %] 100 % (04/26 0719) ?Weight:  [81.1 kg] 81.1 kg (04/26 0519) ?Last BM Date : 03/28/22 ?Filed Weights  ? 03/27/22 1358 03/29/22 0519  ?Weight: 80.7 kg 81.1 kg  ? ?General: Looks well.  Comfortable ?Heart: RRR. ?Chest: Clear bilaterally. ?Abdomen: Soft without tenderness or distention.  Active bowel sounds ?Extremities: No CCE. ?Neuro/Psych: Oriented x3.  Standing and walking from the bed to the bathroom without issues. ? ?Intake/Output from previous day: ?04/25 0701 - 04/26 0700 ?In: 2738.7 [I.V.:2688.7; IV Piggyback:50] ?Out: -  ? ?Intake/Output this shift: ?No intake/output data recorded. ? ?Lab Results: ?Recent Labs  ?  03/27/22 ?1357 03/28/22 ?0045 03/28/22 ?1728 03/29/22 ?0110  ?WBC 11.4*  --   --  6.3  ?HGB 13.2 12.6 11.3* 10.8*  ?HCT 40.7 39.2 34.0* 33.0*  ?PLT 288  --   --  234  ? ?BMET ?Recent Labs  ?  03/27/22 ?1357 03/28/22 ?0045 03/29/22 ?0110  ?NA 141 139 139  ?K 4.1 3.7 3.3*  ?CL 100 106 103  ?CO2 '29 27 29  '$ ?GLUCOSE 124* 145* 119*  ?BUN '23 22 19  '$ ?CREATININE 1.63* 1.78* 1.38*  ?CALCIUM 10.3 9.6 9.2  ? ?LFT ?Recent Labs  ?  03/27/22 ?1357  ?PROT 7.4  ?ALBUMIN 3.7  ?AST 24  ?ALT 22  ?ALKPHOS 96  ?BILITOT 1.3*  ? ?PT/INR ?Recent Labs  ?  03/27/22 ?1357  ?LABPROT 14.8  ?INR 1.2  ? ? ?Studies/Results: ?No results found. ? ?ASSESMENT:  ? ?Painless hematochezia.   Diverticulosis and hyperplastic polyps on 2007 colonoscopy, Cologuard negative in 10/2021. ? 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  Hgb 13.2 on admit, now 10.8. ? ?Chronic apixaban for A-fib.  On hold, last dose 4/24. ? ?Severe aortic stenosis, currently asymptomatic. ? ? ?PLAN  ? ? Colonoscopy tmrw 4/27 1 PM, see prep orders.   ? ? ?Azucena Freed  03/29/2022, 10:10 AM ?Phone [phone]  ? ? ? Attending Physician Note  ? ?I have taken an interval history, reviewed the chart and examined the patient. I performed more than 50% of this encounter in conjunction with the APP. I agree with the APP's note, impression and recommendations with my edits.   ? ?Lucio Edward, MD [facility] ?See AMION, [facility], for our on call provider  ?

## 2022-03-29 NOTE — Progress Notes (Signed)
Mobility Specialist Progress Note: ? ? 03/29/22 1134  ?Mobility  ?Activity Ambulated with assistance in hallway  ?Level of Assistance Independent after set-up  ?Assistive Device None  ?Distance Ambulated (ft) 1120 ft  ?Activity Response Tolerated well  ?$Mobility charge 1 Mobility  ? ?Pt received In chair willing to participate in mobility. No complaints of pain. Left in chair with call bell in reach and all needs met.  ? ?Tracy James ?Mobility Specialist ?Primary Phone [phone] ? ?

## 2022-03-29 NOTE — Progress Notes (Signed)
?PROGRESS NOTE ? ?NYKIRA REDDIX YTK:[id number] DOB: 1951/08/09 DOA: 03/27/2022 ?PCP: Charlott Rakes, MD ? ? LOS: 1 day  ? ?Brief Narrative / Interim history: ?Tracy James is a 71 y.o. 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  She denies any abdominal pain, nausea or vomiting with it.  On admission, gastroenterology was consulted.  She will undergo colonoscopy 4/27 after Eliquis washout ? ?Subjective / 24h Interval events: ?Doing well this morning, denies any chest pain, denies any shortness of breath.  She has no abdominal pain, no nausea or vomiting ? ?Assesement and Plan: ?Principal Problem: ?  GI bleed ?Active Problems: ?  Essential hypertension ?  Overweight (BMI 25.0-29.9) ?  Aortic stenosis ?  Persistent atrial fibrillation ([place]) ?  Chronic systolic CHF (congestive heart failure) ([place]) ?  Chronic kidney disease, stage 3b ([place]) ?  Secondary hypercoagulable state ([place]) ?  Smoking ? ?Principal problem ?Lower GI bleed-gastroenterology consulted, plan for colonoscopy 4/27.  Clinically it appears like this was a diverticular bleed.  Continue to hold Eliquis.  Continue to monitor hemoglobin, stable at 10.8 this morning. ? ?Active problems ?Chronic kidney disease, stage IIIb-baseline creatinine around 1.5, currently at baseline ? ?Hypokalemia-replete and continue to monitor ? ?Persistent A-fib-continue Tikosyn, metoprolol, hold Eliquis as above ? ?Chronic systolic CHF-previous EF 55-73%.  Hold furosemide, hydralazine for now given concern for GI bleed.  Stop fluids ? ?Aortic stenosis-asymptomatic ? ?Essential hypertension-continue metoprolol, blood pressure stable ? ?Tobacco use-cessation recommended ? ? ?Scheduled Meds: ? atorvastatin  20 mg Oral Daily  ? bisacodyl  20 mg Oral Once  ? dofetilide  250 mcg Oral BID  ? metoCLOPramide (REGLAN) injection  10 mg Intravenous Once  ? Followed by  ? [START  ON 03/30/2022] metoCLOPramide (REGLAN) injection  10 mg Intravenous Once  ? metoprolol tartrate  25 mg Oral BID  ? peg 3350 powder  0.5 kit Oral Once  ? And  ? [START ON 03/30/2022] peg 3350 powder  0.5 kit Oral Once  ? potassium chloride  40 mEq Oral Q4H  ? Or  ? potassium chloride  40 mEq Oral Q4H  ? ?Continuous Infusions: ? dextrose 5% lactated ringers 100 mL/hr at 03/28/22 2114  ? ?PRN Meds:.acetaminophen **OR** acetaminophen ? ?Diet Orders (From admission, onward)  ? ?  Start     Ordered  ? 03/30/22 0900  Diet NPO time specified Except for: Sips with Meds  Diet effective ____       ?Question:  Except for  Answer:  Sips with Meds  ? 03/29/22 1022  ? 03/28/22 0947  Diet clear liquid Room service appropriate? Yes; Fluid consistency: Thin  Diet effective now       ?Question Answer Comment  ?Room service appropriate? Yes   ?Fluid consistency: Thin   ?  ? 03/28/22 0946  ? ?  ?  ? ?  ? ? ?DVT prophylaxis: SCDs Start: 03/28/22 0045 ? ? ?Lab Results  ?Component Value Date  ? PLT 234 03/29/2022  ? ? ?  Code Status: Full Code ? ?Family Communication: no family at bedside  ? ?Status is: Inpatient ? ?Remains inpatient appropriate because: Pending colonoscopy ? ?Level of care: Med-Surg ? ?Consultants:  ?Gastroenterology ? ?Procedures:  ?none ? ?Microbiology  ?none ? ?Antimicrobials: ?none  ? ? ?Objective: ?Vitals:  ? 03/28/22 2018 03/29/22 0039 03/29/22 0519 03/29/22 0719  ?BP: 132/84 113/70 122/90 113/68  ?  Pulse: 70 69 63 65  ?Resp: $Remov'16 17 17 16  'iTCjny$ ?Temp: (!) 97.5 ?F (36.4 ?C)  98 ?F (36.7 ?C) 97.8 ?F (36.6 ?C)  ?TempSrc: Oral  Oral   ?SpO2: 100% 100% 100% 100%  ?Weight:   81.1 kg   ?Height:      ? ? ?Intake/Output Summary (Last 24 hours) at 03/29/2022 1242 ?Last data filed at 03/29/2022 0439 ?Gross per 24 hour  ?Intake 2688.66 ml  ?Output --  ?Net 2688.66 ml  ? ?Wt Readings from Last 3 Encounters:  ?03/29/22 81.1 kg  ?03/17/22 78.8 kg  ?02/21/22 80.2 kg  ? ? ?Examination: ? ?Constitutional: NAD ?Eyes: no scleral icterus ?ENMT:  Mucous membranes are moist.  ?Neck: normal, supple ?Respiratory: clear to auscultation bilaterally, no wheezing, no crackles.  ?Cardiovascular: Regular rate and rhythm, no murmurs / rubs / gallops.  ?Abdomen: non distended, no tenderness. Bowel sounds positive.  ?Musculoskeletal: no clubbing / cyanosis.  ?Skin: no rashes ?Neurologic: non focal  ? ?Data Reviewed: I have independently reviewed following labs and imaging studies  ? ?CBC ?Recent Labs  ?Lab 03/27/22 ?1357 03/28/22 ?0045 03/28/22 ?1728 03/29/22 ?0110  ?WBC 11.4*  --   --  6.3  ?HGB 13.2 12.6 11.3* 10.8*  ?HCT 40.7 39.2 34.0* 33.0*  ?PLT 288  --   --  234  ?MCV 89.5  --   --  89.7  ?MCH 29.0  --   --  29.3  ?MCHC 32.4  --   --  32.7  ?RDW 15.4  --   --  15.2  ? ? ?Recent Labs  ?Lab 03/27/22 ?1357 03/28/22 ?0045 03/29/22 ?0110  ?NA 141 139 139  ?K 4.1 3.7 3.3*  ?CL 100 106 103  ?CO2 $Rem'29 27 29  'Jmpr$ ?GLUCOSE 124* 145* 119*  ?BUN $Rem'23 22 19  'JFZH$ ?CREATININE 1.63* 1.78* 1.38*  ?CALCIUM 10.3 9.6 9.2  ?AST 24  --   --   ?ALT 22  --   --   ?ALKPHOS 96  --   --   ?BILITOT 1.3*  --   --   ?ALBUMIN 3.7  --   --   ?MG  --  1.9  --   ?INR 1.2  --   --   ? ? ?------------------------------------------------------------------------------------------------------------------ ?No results for input(s): CHOL, HDL, LDLCALC, TRIG, CHOLHDL, LDLDIRECT in the last 72 hours. ? ?Lab Results  ?Component Value Date  ? HGBA1C 6.0 (H) 10/19/2021  ? ?------------------------------------------------------------------------------------------------------------------ ?No results for input(s): TSH, T4TOTAL, T3FREE, THYROIDAB in the last 72 hours. ? ?Invalid input(s): FREET3 ? ?Cardiac Enzymes ?No results for input(s): CKMB, TROPONINI, MYOGLOBIN in the last 168 hours. ? ?Invalid input(s): CK ?------------------------------------------------------------------------------------------------------------------ ?No results found for: BNP ? ?CBG: ?No results for input(s): GLUCAP in the last 168 hours. ? ?Recent  Results (from the past 240 hour(s))  ?Gastrointestinal Panel by PCR , Stool     Status: None  ? Collection Time: 03/28/22  7:50 AM  ? Specimen: Stool  ?Result Value Ref Range Status  ? Campylobacter species NOT DETECTED NOT DETECTED Final  ? Plesimonas shigelloides NOT DETECTED NOT DETECTED Final  ? Salmonella species NOT DETECTED NOT DETECTED Final  ? Yersinia enterocolitica NOT DETECTED NOT DETECTED Final  ? Vibrio species NOT DETECTED NOT DETECTED Final  ? Vibrio cholerae NOT DETECTED NOT DETECTED Final  ? Enteroaggregative E coli (EAEC) NOT DETECTED NOT DETECTED Final  ? Enteropathogenic E coli (EPEC) NOT DETECTED NOT DETECTED Final  ? Enterotoxigenic E coli (ETEC) NOT DETECTED NOT DETECTED Final  ?  Shiga like toxin producing E coli (STEC) NOT DETECTED NOT DETECTED Final  ? Shigella/Enteroinvasive E coli (EIEC) NOT DETECTED NOT DETECTED Final  ? Cryptosporidium NOT DETECTED NOT DETECTED Final  ? Cyclospora cayetanensis NOT DETECTED NOT DETECTED Final  ? Entamoeba histolytica NOT DETECTED NOT DETECTED Final  ? Giardia lamblia NOT DETECTED NOT DETECTED Final  ? Adenovirus F40/41 NOT DETECTED NOT DETECTED Final  ? Astrovirus NOT DETECTED NOT DETECTED Final  ? Norovirus GI/GII NOT DETECTED NOT DETECTED Final  ? Rotavirus A NOT DETECTED NOT DETECTED Final  ? Sapovirus (I, II, IV, and V) NOT DETECTED NOT DETECTED Final  ?  Comment: Performed at [facility], [address] [place], [place] [address]  ?  ? ?Radiology Studies: ?No results found. ? ? ?Marzetta Board, MD, PhD ?[facility] ? ?Between 7 am - 7 pm I am available, please contact me via Amion (for emergencies) or Securechat (non urgent messages) ? ?Between 7 pm - 7 am I am not available, please contact night coverage MD/APP via Amion ? ?

## 2022-03-29 NOTE — H&P (View-Only) (Signed)
? ?         Daily Rounding Note ? ?03/29/2022, 10:10 AM ? LOS: 1 day  ? ?SUBJECTIVE:   ?Chief complaint: Lower GI bleed.  Presumed diverticular.  Chronic Eliquis on hold. ? ?1 stool last night was brown with a few areas that were darker but no obvious blood.  Slight positional dizziness which resolves quickly.  Overall feels great. ? ?OBJECTIVE:        ? Vital signs in last 24 hours:    ?Temp:  [97.4 ?F (36.3 ?C)-98 ?F (36.7 ?C)] 97.8 ?F (36.6 ?C) (04/26 0719) ?Pulse Rate:  [62-80] 65 (04/26 0719) ?Resp:  [10-17] 16 (04/26 0719) ?BP: (113-132)/(62-90) 113/68 (04/26 0719) ?SpO2:  [98 %-100 %] 100 % (04/26 0719) ?Weight:  [81.1 kg] 81.1 kg (04/26 0519) ?Last BM Date : 03/28/22 ?Filed Weights  ? 03/27/22 1358 03/29/22 0519  ?Weight: 80.7 kg 81.1 kg  ? ?General: Looks well.  Comfortable ?Heart: RRR. ?Chest: Clear bilaterally. ?Abdomen: Soft without tenderness or distention.  Active bowel sounds ?Extremities: No CCE. ?Neuro/Psych: Oriented x3.  Standing and walking from the bed to the bathroom without issues. ? ?Intake/Output from previous day: ?04/25 0701 - 04/26 0700 ?In: 2738.7 [I.V.:2688.7; IV Piggyback:50] ?Out: -  ? ?Intake/Output this shift: ?No intake/output data recorded. ? ?Lab Results: ?Recent Labs  ?  03/27/22 ?1357 03/28/22 ?0045 03/28/22 ?1728 03/29/22 ?0110  ?WBC 11.4*  --   --  6.3  ?HGB 13.2 12.6 11.3* 10.8*  ?HCT 40.7 39.2 34.0* 33.0*  ?PLT 288  --   --  234  ? ?BMET ?Recent Labs  ?  03/27/22 ?1357 03/28/22 ?0045 03/29/22 ?0110  ?NA 141 139 139  ?K 4.1 3.7 3.3*  ?CL 100 106 103  ?CO2 '29 27 29  '$ ?GLUCOSE 124* 145* 119*  ?BUN '23 22 19  '$ ?CREATININE 1.63* 1.78* 1.38*  ?CALCIUM 10.3 9.6 9.2  ? ?LFT ?Recent Labs  ?  03/27/22 ?1357  ?PROT 7.4  ?ALBUMIN 3.7  ?AST 24  ?ALT 22  ?ALKPHOS 96  ?BILITOT 1.3*  ? ?PT/INR ?Recent Labs  ?  03/27/22 ?1357  ?LABPROT 14.8  ?INR 1.2  ? ? ?Studies/Results: ?No results found. ? ?ASSESMENT:  ? ?Painless hematochezia.   Diverticulosis and hyperplastic polyps on 2007 colonoscopy, Cologuard negative in 10/2021. ? ?[place] anemia in setting of blood loss.  Hgb 13.2 on admit, now 10.8. ? ?Chronic apixaban for A-fib.  On hold, last dose 4/24. ? ?Severe aortic stenosis, currently asymptomatic. ? ? ?PLAN  ? ? Colonoscopy tmrw 4/27 1 PM, see prep orders.   ? ? ?Azucena Freed  03/29/2022, 10:10 AM ?Phone [phone]  ? ? ? Attending Physician Note  ? ?I have taken an interval history, reviewed the chart and examined the patient. I performed more than 50% of this encounter in conjunction with the APP. I agree with the APP's note, impression and recommendations with my edits.   ? ?Lucio Edward, MD [facility] ?See AMION, [facility], for our on call provider  ?

## 2022-03-30 ENCOUNTER — Encounter (HOSPITAL_COMMUNITY): Payer: Self-pay | Admitting: Family Medicine

## 2022-03-30 ENCOUNTER — Inpatient Hospital Stay (HOSPITAL_COMMUNITY): Payer: Medicare PPO | Admitting: Anesthesiology

## 2022-03-30 ENCOUNTER — Encounter (HOSPITAL_COMMUNITY)
Admission: EM | Disposition: A | Discharge status: Home / Self Care | Payer: Self-pay | Source: Home / Self Care | Attending: Family Medicine

## 2022-03-30 DIAGNOSIS — I5042 Chronic combined systolic (congestive) and diastolic (congestive) heart failure: Secondary | ICD-10-CM

## 2022-03-30 DIAGNOSIS — K5731 Diverticulosis of large intestine without perforation or abscess with bleeding: Secondary | ICD-10-CM

## 2022-03-30 DIAGNOSIS — I110 Hypertensive heart disease with heart failure: Secondary | ICD-10-CM

## 2022-03-30 DIAGNOSIS — K635 Polyp of colon: Secondary | ICD-10-CM

## 2022-03-30 DIAGNOSIS — D62 Acute posthemorrhagic anemia: Secondary | ICD-10-CM

## 2022-03-30 DIAGNOSIS — D120 Benign neoplasm of cecum: Secondary | ICD-10-CM

## 2022-03-30 DIAGNOSIS — N1832 Chronic kidney disease, stage 3b: Secondary | ICD-10-CM | POA: Diagnosis not present

## 2022-03-30 DIAGNOSIS — I350 Nonrheumatic aortic (valve) stenosis: Secondary | ICD-10-CM | POA: Diagnosis not present

## 2022-03-30 HISTORY — PX: COLONOSCOPY WITH PROPOFOL: SHX5780

## 2022-03-30 HISTORY — PX: POLYPECTOMY: SHX5525

## 2022-03-30 LAB — BASIC METABOLIC PANEL
Anion gap: 7 (ref 5–15)
BUN: 12 mg/dL (ref 8–23)
CO2: 27 mmol/L (ref 22–32)
Calcium: 9.5 mg/dL (ref 8.9–10.3)
Chloride: 108 mmol/L (ref 98–111)
Creatinine, Ser: 1.14 mg/dL — ABNORMAL HIGH (ref 0.44–1.00)
GFR, Estimated: 52 mL/min — ABNORMAL LOW (ref >60)
Glucose, Bld: 86 mg/dL (ref 70–99)
Potassium: 3.8 mmol/L (ref 3.5–5.1)
Sodium: 142 mmol/L (ref 135–145)

## 2022-03-30 LAB — MAGNESIUM: Magnesium: 1.9 mg/dL (ref 1.7–2.4)

## 2022-03-30 LAB — CBC
HCT: 34.5 % — ABNORMAL LOW (ref 36.0–46.0)
Hemoglobin: 10.9 g/dL — ABNORMAL LOW (ref 12.0–15.0)
MCH: 28.9 pg (ref 26.0–34.0)
MCHC: 31.6 g/dL (ref 30.0–36.0)
MCV: 91.5 fL (ref 80.0–100.0)
Platelets: 254 10*3/uL (ref 150–400)
RBC: 3.77 MIL/uL — ABNORMAL LOW (ref 3.87–5.11)
RDW: 15.2 % (ref 11.5–15.5)
WBC: 6 10*3/uL (ref 4.0–10.5)
nRBC: 0 % (ref 0.0–0.2)

## 2022-03-30 SURGERY — COLONOSCOPY WITH PROPOFOL
Anesthesia: Monitor Anesthesia Care

## 2022-03-30 MED ORDER — PROPOFOL 10 MG/ML IV BOLUS
INTRAVENOUS | Status: DC | PRN
  Administered 2022-03-30: 20 mg via INTRAVENOUS

## 2022-03-30 MED ORDER — PROPOFOL 500 MG/50ML IV EMUL
INTRAVENOUS | Status: DC | PRN
  Administered 2022-03-30: 150 ug/kg/min via INTRAVENOUS

## 2022-03-30 MED ORDER — LACTATED RINGERS IV SOLN: INTRAVENOUS | Status: DC | PRN

## 2022-03-30 MED ORDER — MAGNESIUM SULFATE 2 GM/50ML IV SOLN
2.0000 g | Freq: Once | INTRAVENOUS | Status: AC
  Administered 2022-03-30: 2 g via INTRAVENOUS
  Filled 2022-03-30: qty 50

## 2022-03-30 MED ORDER — POTASSIUM CHLORIDE CRYS ER 20 MEQ PO TBCR
40.0000 meq | EXTENDED_RELEASE_TABLET | Freq: Once | ORAL | Status: AC
  Administered 2022-03-30: 40 meq via ORAL
  Filled 2022-03-30: qty 2

## 2022-03-30 MED ORDER — LIDOCAINE 2% (20 MG/ML) 5 ML SYRINGE
INTRAMUSCULAR | Status: DC | PRN
  Administered 2022-03-30: 50 mg via INTRAVENOUS

## 2022-03-30 SURGICAL SUPPLY — 22 items

## 2022-03-30 NOTE — Anesthesia Procedure Notes (Signed)
Procedure Name: [place] ?Date/Time: 03/30/2022 1:17 PM ?Performed by: Lieutenant Diego, CRNA ?Pre-anesthesia Checklist: Patient identified, Emergency Drugs available, Suction available, Patient being monitored and Timeout performed ?Patient Re-evaluated:Patient Re-evaluated prior to induction ?Oxygen Delivery Method: Simple face mask ?Preoxygenation: Pre-oxygenation with 100% oxygen ?Induction Type: IV induction ? ? ? ? ?

## 2022-03-30 NOTE — Interval H&P Note (Signed)
History and Physical Interval Note: ? ?03/30/2022 ?1:04 PM ? ?Tracy James  has presented today for surgery, with the diagnosis of Painless lower GI bleed.  Blood loss anemia not requiring transfusion..  The various methods of treatment have been discussed with the patient and family. After consideration of risks, benefits and other options for treatment, the patient has consented to  Procedure(s): ?COLONOSCOPY WITH PROPOFOL (N/A) as a surgical intervention.  The patient's history has been reviewed, patient examined, no change in status, stable for surgery.  I have reviewed the patient's chart and labs.  Questions were answered to the patient's satisfaction.   ? ? ?Pricilla Riffle. Fuller Plan ? ? ?

## 2022-03-30 NOTE — Anesthesia Preprocedure Evaluation (Signed)
Anesthesia Evaluation  ?Patient identified by MRN, date of birth, ID band ?Patient awake ? ? ? ?Reviewed: ?Allergy & Precautions, NPO status , Patient's Chart, lab work & pertinent test results ? ?Airway ?Mallampati: II ? ?TM Distance: >3 FB ?Neck ROM: Full ? ? ? Dental ? ?(+) Teeth Intact, Dental Advisory Given ?  ?Pulmonary ?Current Smoker and Patient abstained from smoking.,  ?  ?Pulmonary exam normal ?breath sounds clear to auscultation ? ? ? ? ? ? Cardiovascular ?hypertension, +CHF  ?Normal cardiovascular exam+ dysrhythmias Atrial Fibrillation + Valvular Problems/Murmurs AS  ?Rhythm:Regular Rate:Normal ? ?Echo 02/21/22: ?1. Left ventricular ejection fraction, by estimation, is 55 to 60%. The  ?left ventricle has normal function. The left ventricle has no regional  ?wall motion abnormalities. There is mild left ventricular hypertrophy.  ?Left ventricular diastolic parameters  ?are consistent with Grade II diastolic dysfunction (pseudonormalization).  ??2. Right ventricular systolic function is mildly reduced. The right  ?ventricular size is normal. Tricuspid regurgitation signal is inadequate  ?for assessing PA pressure.  ??3. Left atrial size was moderately dilated.  ??4. The mitral valve is normal in structure. No evidence of mitral valve  ?regurgitation. No evidence of mitral stenosis.  ??5. The aortic valve is tricuspid. There is moderate calcification of the  ?aortic valve. Aortic valve regurgitation is trivial. Severe aortic valve  ?stenosis (paradoxical low flow/low gradient severe AS). Aortic valve area,  ?by VTI measures 0.52 cm?Marland Kitchen Aortic  ??valve mean gradient measures 34.0 mmHg.  ??6. The inferior vena cava is normal in size with greater than 50%  ?respiratory variability, suggesting right atrial pressure of 3 mmHg.  ?  ?Neuro/Psych ?negative neurological ROS ? negative psych ROS  ? GI/Hepatic ?negative GI ROS, Neg liver ROS,   ?Endo/Other  ?negative endocrine ROS ?  Renal/GU ?Renal InsufficiencyRenal disease  ? ?  ?Musculoskeletal ?negative musculoskeletal ROS ?(+)  ? Abdominal ?  ?Peds ? Hematology ? ?(+) Blood dyscrasia, anemia ,   ?Anesthesia Other Findings ?Day of surgery medications reviewed with the patient. ? Reproductive/Obstetrics ? ?  ? ? ? ? ? ? ? ? ? ? ? ? ? ?  ?  ? ? ? ? ? ? ? ? ?Anesthesia Physical ?Anesthesia Plan ? ?ASA: 3 ? ?Anesthesia Plan: MAC  ? ?Post-op Pain Management: Minimal or no pain anticipated  ? ?Induction: Intravenous ? ?PONV Risk Score and Plan: 1 and TIVA and Treatment may vary due to age or medical condition ? ?Airway Management Planned: Nasal Cannula and Natural Airway ? ?Additional Equipment:  ? ?Intra-op Plan:  ? ?Post-operative Plan:  ? ?Informed Consent: I have reviewed the patients History and Physical, chart, labs and discussed the procedure including the risks, benefits and alternatives for the proposed anesthesia with the patient or authorized representative who has indicated his/her understanding and acceptance.  ? ? ? ?Dental advisory given ? ?Plan Discussed with: CRNA and Anesthesiologist ? ?Anesthesia Plan Comments:   ? ? ? ? ? ? ?Anesthesia Quick Evaluation ? ?

## 2022-03-30 NOTE — Progress Notes (Signed)
Pharmacy: Dofetilide (Tikosyn) - Follow Up ?Assessment and Electrolyte Replacement ? ?Pharmacy consulted to assist in monitoring and replacing electrolytes in this 71 y.o. female admitted on 03/27/2022 undergoing dofetilide re-initiation.   ? ?Labs: ?   ?Component Value Date/Time  ? K 3.8 03/30/2022 0644  ? MG 1.9 03/30/2022 0644  ?  ? ?Plan: ?Potassium: ?K 3.8-3.9:  Give KCl 40 mEq po x1  ? ?Magnesium: ?Mg 1.8-2: Give Mg 2 gm IV x1  ?  ? ?Thank you for allowing pharmacy to participate in this patient's care  ? ?Benetta Spar, PharmD, BCPS, BCCP ?Clinical Pharmacist ? ?Please check AMION for all [place] phone numbers ?After 10:00 PM, call [place] [phone] ? ?

## 2022-03-30 NOTE — Op Note (Signed)
[facility] ?Patient Name: Tracy James ?Procedure Date : 03/30/2022 ?MRN: [id number] ?Attending MD: Ladene Artist , MD ?Date of Birth: June 07, 1951 ?CSN: [id number] ?Age: 71 ?Admit Type: Inpatient ?Procedure:                Colonoscopy ?Indications:              Hematochezia, Acute post hemorrhagic anemia ?Providers:                Pricilla Riffle. Fuller Plan, MD, Jeanella Cara, RN,  ?                          Tyna Jaksch Technician ?Referring MD:             TR[facility] ?Medicines:                Monitored Anesthesia Care ?Complications:            No immediate complications. Estimated blood loss:  ?                          None. ?Estimated Blood Loss:     Estimated blood loss: none. ?Procedure:                Pre-Anesthesia Assessment: ?                          - Prior to the procedure, a History and Physical  ?                          was performed, and patient medications and  ?                          allergies were reviewed. The patient's tolerance of  ?                          previous anesthesia was also reviewed. The risks  ?                          and benefits of the procedure and the sedation  ?                          options and risks were discussed with the patient.  ?                          All questions were answered, and informed consent  ?                          was obtained. Prior Anticoagulants: The patient has  ?                          taken Eliquis (apixaban), last dose was 3 days  ?                          prior to procedure. ASA Grade Assessment: III - A  ?  patient with severe systemic disease. After  ?                          reviewing the risks and benefits, the patient was  ?                          deemed in satisfactory condition to undergo the  ?                          procedure. ?                          After obtaining informed consent, the colonoscope  ?                          was passed under direct vision. Throughout the  ?                           procedure, the patient's blood pressure, pulse, and  ?                          oxygen saturations were monitored continuously. The  ?                          [id number]-HQ190L ([id number]) Olympus coloscope was  ?                          introduced through the anus and advanced to the the  ?                          cecum, identified by appendiceal orifice and  ?                          ileocecal valve. The ileocecal valve, appendiceal  ?                          orifice, and rectum were photographed. The quality  ?                          of the bowel preparation was good. The colonoscopy  ?                          was performed without difficulty. Unable to safely  ?                          retroflex due to a narrow rectal vault. The patient  ?                          tolerated the procedure well. ?Scope In: 1:22:25 PM ?Scope Out: 1:42:26 PM ?Scope Withdrawal Time: 0 hours 11 minutes 52 seconds  ?Total Procedure Duration: 0 hours 20 minutes 1 second  ?Findings: ?     The perianal and digital rectal examinations were normal. ?     A 6 mm polyp was found in the cecum. The polyp was  sessile. The polyp  ?     was removed with a cold snare. Resection and retrieval were complete. ?     Multiple medium-mouthed diverticula were found in the left colon. There  ?     was narrowing of the colon in association with the diverticular opening.  ?     There was evidence of diverticular spasm. Peri-diverticular erythema was  ?     seen. There was no evidence of diverticular bleeding. ?     The exam was otherwise without abnormality on direct views. No blood in  ?     the colon. ?Impression:               - One 6 mm polyp in the cecum, removed with a cold  ?                          snare. Resected and retrieved. ?                          - Severe diverticulosis in the left colon. ?                          - The examination was otherwise normal on direct  ?                          views. ?Recommendation:            - Return patient to hospital ward for ongoing care. ?                          - Consider repeat colonoscopy after studies are  ?                          complete for surveillance based on pathology  ?                          results. ?                          - Resume Eliquis (apixaban) no sooner than 3 days  ?                          at prior dose as indicated. Refer to managing  ?                          physician for further adjustment of therapy. ?                          - Patient has a contact number available for  ?                          emergencies. The signs and symptoms of potential  ?                          delayed complications were discussed with the  ?  patient. Return to normal activities tomorrow. ?                          - Advance to a soft diet for one week then a high  ?                          fiber diet long term. ?                          - Continue present medications. ?                          - Await pathology results. ?                          - OK for discharge from GI standpoint if diet is  ?                          tolerated. ?                          - No plans for outpatient GI followup at this time. ?                          - PCP follow up post hospitalization. ?                          - GI signing off. ?Procedure Code(s):        --- Professional --- ?                          [phone], Colonoscopy, flexible; with removal of  ?                          tumor(s), polyp(s), or other lesion(s) by snare  ?                          technique ?Diagnosis Code(s):        --- Professional --- ?                          K63.5, Polyp of colon ?                          K92.1, Melena (includes Hematochezia) ?                          D62, Acute posthemorrhagic anemia ?                          K57.30, Diverticulosis of large intestine without  ?                          perforation or abscess without bleeding ?CPT copyright 2019 American Medical  Association. All rights reserved. ?The codes documented in this report are preliminary and upon coder review may  ?be revised to meet current compliance requirements. ?  Ladene Artist, MD ?03/30/2022 2:00:42 PM ?This report has been signed electronically. ?Number of Addenda: 0 ?

## 2022-03-30 NOTE — Progress Notes (Signed)
Pt remains compliant with bowel prep. She states her BM this morning is clear yellow with no solid pieces. NPO since prior to midnight. Plan for colonoscopy this afternoon. ? ?EKG done per order. No change to previous one. ?

## 2022-03-30 NOTE — Discharge Summary (Signed)
? ?Physician Discharge Summary  ?[name] NOB:[id number] DOB: 1951-06-06 DOA: 03/27/2022 ? ?PCP: Charlott Rakes, MD ? ?Admit date: 03/27/2022 ?Discharge date: 03/30/2022 ? ?Admitted From: home ?Disposition:  home ? ?Recommendations for Outpatient Follow-up:  ?Follow up with PCP in 1-2 weeks ?Please obtain BMP/CBC in one week ? ?Home Health: none ?Equipment/Devices: none ? ?Discharge Condition: stable ?CODE STATUS: Full code ?Diet recommendation: regular ? ?HPI: Per admitting MD, ?Tracy James is a 71 y.o. 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 Patient was in her USOH until last night, woke up to have a bowel movement and it was bloody.  She describes it as "bright red formed stool".  Had several more BMs, reports her son later saw one said it was "like spinach".  Finally, this morning, she was still having BM, and she was too weak to get up, so she called EMS. In the ER, Hgb 13.2, BP 113/74 (relative to baseline elevated).  Cr and electrolytes normal.  DRE showed "maroon" stool per EDP.  Started on Protonix and admitted for GI bleed. ? ?Hospital Course / Discharge diagnoses: ?Principal Problem: ?  GI bleed ?Active Problems: ?  Essential hypertension ?  Overweight (BMI 25.0-29.9) ?  Aortic stenosis ?  Persistent atrial fibrillation ([place]) ?  Chronic systolic CHF (congestive heart failure) ([place]) ?  Chronic kidney disease, stage 3b ([place]) ?  Secondary hypercoagulable state ([place]) ?  Smoking ?  Hypokalemia ?  Hematochezia ?  Long term current use of anticoagulant therapy ?  Acute blood loss anemia ?  Benign neoplasm of cecum ? ?Principal problem ?Lower GI bleed-gastroenterology consulted and followed patient while hospitalized.  She underwent a colonoscopy on 4/27 after Eliquis washout, which showed one 6 mm polyp in the cecum, removed, as well as severe diverticulosis in the left colon. Clinically it  appears that she has had a diverticular bleed.  Bleeding has resolved, and her hemoglobin has remained stable.  She did not require blood transfusions.  Discussed with GI, she will be discharged home, recommendations are to continue to hold anticoagulation for at least 3 days, and she is to resume her Eliquis next week. ?  ?Active problems ?Chronic kidney disease, stage IIIb-baseline creatinine around 1.5, currently at baseline ?Hypokalemia-continue to monitor in an outpatient setting ?Persistent A-fib-continue Tikosyn, metoprolol, hold Eliquis as above for the next few days and resume next week  ?Chronic systolic CHF-previous EF 94-76%.  Resume home medications ?Aortic stenosis-asymptomatic ?Essential hypertension-continue metoprolol, blood pressure stable ?Tobacco use-cessation recommended ? ?Sepsis ruled out ? ?Discharge Instructions ? ? ?Allergies as of 03/30/2022   ?No Known Allergies ?  ? ?  ?Medication List  ?  ? ?TAKE these medications   ? ?acetaminophen 500 MG tablet ?Commonly known as: TYLENOL ?Take 500 mg by mouth every 6 (six) hours as needed (for pain.). ?  ?atorvastatin 20 MG tablet ?Commonly known as: LIPITOR ?Take 1 tablet (20 mg total) by mouth daily. ?  ?calcium-vitamin D 500-200 MG-UNIT Tabs tablet ?Commonly known as: OSCAL WITH D ?Take 1 tablet by mouth daily. ?  ?dofetilide 250 MCG capsule ?Commonly known as: TIKOSYN ?Take 1 capsule by mouth twice daily ?  ?Eliquis 5 MG Tabs tablet ?Generic drug: apixaban ?TAKE 1 TABLET BY MOUTH EVERY 12 HOURS ?What changed:  ?how much to take ?when to take this ?  ?furosemide 40 MG tablet ?Commonly known as: LASIX ?Take  1 tablet by mouth once daily ?  ?hydrALAZINE 50 MG tablet ?Commonly known as: APRESOLINE ?TAKE 1 TABLET BY MOUTH THREE TIMES DAILY ?  ?loratadine 10 MG tablet ?Commonly known as: CLARITIN ?Take 10 mg by mouth daily as needed for allergies. ?  ?metoprolol tartrate 25 MG tablet ?Commonly known as: LOPRESSOR ?Take 1 tablet (25 mg total) by mouth 2  (two) times daily. ?  ?mometasone 0.1 % ointment ?Commonly known as: ELOCON ?Apply 1 application. topically daily as needed (for skin rash). ?  ?nitroGLYCERIN 0.4 MG SL tablet ?Commonly known as: NITROSTAT ?Place 1 tablet (0.4 mg total) under the tongue every 5 (five) minutes as needed for chest pain. ?  ?Olopatadine HCl 0.2 % Soln ?Apply 1 drop to eye daily. One drop to affected eye once daily ?  ?Omega-3 1000 MG Caps ?Take 1,000 mg by mouth daily. ?  ?VITAMIN D PO ?Take 1 tablet by mouth daily. ?  ? ?  ? ? ?Consultations: ?GI ? ?Procedures/Studies: ?Colonoscopy ? ?No results found. ? ? ?Subjective: ?- no chest pain, shortness of breath, no abdominal pain, nausea or vomiting.  ? ?Discharge Exam: ?BP (!) 127/52   Pulse (!) 49   Temp 98.5 ?F (36.9 ?C) (Oral)   Resp 12   Ht '5\' 6"'$  (1.676 m)   Wt 81.1 kg   SpO2 100%   BMI 28.86 kg/m?  ? ?General: Pt is alert, awake, not in acute distress ?Cardiovascular: RRR, S1/S2 +, no rubs, no gallops ?Respiratory: CTA bilaterally, no wheezing, no rhonchi ?Abdominal: Soft, NT, ND, bowel sounds + ?Extremities: no edema, no cyanosis ? ? ? ?The results of significant diagnostics from this hospitalization (including imaging, microbiology, ancillary and laboratory) are listed below for reference.   ? ? ?Microbiology: ?Recent Results (from the past 240 hour(s))  ?Gastrointestinal Panel by PCR , Stool     Status: None  ? Collection Time: 03/28/22  7:50 AM  ? Specimen: Stool  ?Result Value Ref Range Status  ? Campylobacter species NOT DETECTED NOT DETECTED Final  ? Plesimonas shigelloides NOT DETECTED NOT DETECTED Final  ? Salmonella species NOT DETECTED NOT DETECTED Final  ? Yersinia enterocolitica NOT DETECTED NOT DETECTED Final  ? Vibrio species NOT DETECTED NOT DETECTED Final  ? Vibrio cholerae NOT DETECTED NOT DETECTED Final  ? Enteroaggregative E coli (EAEC) NOT DETECTED NOT DETECTED Final  ? Enteropathogenic E coli (EPEC) NOT DETECTED NOT DETECTED Final  ? Enterotoxigenic E  coli (ETEC) NOT DETECTED NOT DETECTED Final  ? Shiga like toxin producing E coli (STEC) NOT DETECTED NOT DETECTED Final  ? Shigella/Enteroinvasive E coli (EIEC) NOT DETECTED NOT DETECTED Final  ? Cryptosporidium NOT DETECTED NOT DETECTED Final  ? Cyclospora cayetanensis NOT DETECTED NOT DETECTED Final  ? Entamoeba histolytica NOT DETECTED NOT DETECTED Final  ? Giardia lamblia NOT DETECTED NOT DETECTED Final  ? Adenovirus F40/41 NOT DETECTED NOT DETECTED Final  ? Astrovirus NOT DETECTED NOT DETECTED Final  ? Norovirus GI/GII NOT DETECTED NOT DETECTED Final  ? Rotavirus A NOT DETECTED NOT DETECTED Final  ? Sapovirus (I, II, IV, and V) NOT DETECTED NOT DETECTED Final  ?  Comment: Performed at [facility], [address], [place], [place] [address]  ?  ? ?Labs: ?Basic Metabolic Panel: ?Recent Labs  ?Lab 03/27/22 ?1357 03/28/22 ?0045 03/29/22 ?0110 03/30/22 ?2947  ?NA 141 139 139 142  ?K 4.1 3.7 3.3* 3.8  ?CL 100 106 103 108  ?CO2 '29 27 29 27  '$ ?GLUCOSE 124* 145*  119* 86  ?BUN '23 22 19 12  '$ ?CREATININE 1.63* 1.78* 1.38* 1.14*  ?CALCIUM 10.3 9.6 9.2 9.5  ?MG  --  1.9  --  1.9  ? ?Liver Function Tests: ?Recent Labs  ?Lab 03/27/22 ?1357  ?AST 24  ?ALT 22  ?ALKPHOS 96  ?BILITOT 1.3*  ?PROT 7.4  ?ALBUMIN 3.7  ? ?CBC: ?Recent Labs  ?Lab 03/27/22 ?1357 03/28/22 ?0045 03/28/22 ?1728 03/29/22 ?0110 03/30/22 ?0645  ?WBC 11.4*  --   --  6.3 6.0  ?HGB 13.2 12.6 11.3* 10.8* 10.9*  ?HCT 40.7 39.2 34.0* 33.0* 34.5*  ?MCV 89.5  --   --  89.7 91.5  ?PLT 288  --   --  234 254  ? ?CBG: ?No results for input(s): GLUCAP in the last 168 hours. ?Hgb A1c ?No results for input(s): HGBA1C in the last 72 hours. ?Lipid Profile ?No results for input(s): CHOL, HDL, LDLCALC, TRIG, CHOLHDL, LDLDIRECT in the last 72 hours. ?Thyroid function studies ?No results for input(s): TSH, T4TOTAL, T3FREE, THYROIDAB in the last 72 hours. ? ?Invalid input(s): FREET3 ?Urinalysis ?   ?Component Value Date/Time  ? [place] YELLOW 12/03/2018 1629  ?  APPEARANCEUR CLEAR 12/03/2018 1629  ? LABSPEC 1.014 12/03/2018 1629  ? PHURINE 5.0 12/03/2018 1629  ? GLUCOSEU NEGATIVE 12/03/2018 1629  ? HGBUR MODERATE (A) 12/03/2018 1629  ? [place] NEGATIVE 12/03/2018 1629  ? K

## 2022-03-30 NOTE — Progress Notes (Signed)
Discharge instructions given at this time with son at bedside.  ?Pt transported off unit via [facility] with all belongings on self. Pt remains A&O and stable at baseline. ?

## 2022-03-30 NOTE — Transfer of Care (Signed)
Immediate Anesthesia Transfer of Care Note ? ?Patient: Tracy James ? ?Procedure(s) Performed: COLONOSCOPY WITH PROPOFOL ?POLYPECTOMY ? ?Patient Location: Endoscopy Unit ? ?Anesthesia Type:MAC ? ?Level of Consciousness: drowsy ? ?Airway & Oxygen Therapy: Patient Spontanous Breathing and Patient connected to face mask oxygen ? ?Post-op Assessment: Report given to RN and Post -op Vital signs reviewed and stable ? ?Post vital signs: Reviewed and stable ? ?Last Vitals:  ?Vitals Value Taken Time  ?BP 75/53 03/30/22 1348  ?Temp    ?Pulse 63 03/30/22 1348  ?Resp 10 03/30/22 1348  ?SpO2 100 % 03/30/22 1348  ?Vitals shown include unvalidated device data. ? ?Last Pain:  ?Vitals:  ? 03/30/22 0812  ?TempSrc: Oral  ?PainSc:   ?   ? ?  ? ?Complications: No notable events documented. ?

## 2022-03-30 NOTE — Care Management Important Message (Signed)
Important Message ? ?Patient Details  ?Name: Tracy James ?MRN: [id number] ?Date of Birth: June 08, 1951 ? ? ?Medicare Important Message Given:  Yes ? ? ? ? ?Mashonda Broski ?03/30/2022, 3:50 PM ?

## 2022-03-30 NOTE — Anesthesia Postprocedure Evaluation (Signed)
Anesthesia Post Note ? ?Patient: Tracy James ? ?Procedure(s) Performed: COLONOSCOPY WITH PROPOFOL ?POLYPECTOMY ? ?  ? ?Patient location during evaluation: Endoscopy ?Anesthesia Type: MAC ?Level of consciousness: oriented, awake and alert and awake ?Pain management: pain level controlled ?Vital Signs Assessment: post-procedure vital signs reviewed and stable ?Respiratory status: spontaneous breathing, nonlabored ventilation, respiratory function stable and patient connected to nasal cannula oxygen ?Cardiovascular status: blood pressure returned to baseline and stable ?Postop Assessment: no headache, no backache and no apparent nausea or vomiting ?Anesthetic complications: no ? ? ?No notable events documented. ? ?Last Vitals:  ?Vitals:  ? 03/30/22 1410 03/30/22 1418  ?BP: (!) 94/46 (!) 127/52  ?Pulse: (!) 52 (!) 49  ?Resp: 16 12  ?Temp:    ?SpO2: 100% 100%  ?  ?Last Pain:  ?Vitals:  ? 03/30/22 1418  ?TempSrc:   ?PainSc: 0-No pain  ? ? ?  ?  ?  ?  ?  ?  ? ?[place] ? ? ? ? ?

## 2022-03-30 NOTE — Discharge Instructions (Addendum)
Do not take Eliquis for the next 3 days. Can resume on Monday, 04/03/2022 ? ?Follow with Charlott Rakes, MD in 5-7 days ? ?Please get a complete blood count and chemistry panel checked by your Primary MD at your next visit, and again as instructed by your Primary MD. Please get your medications reviewed and adjusted by your Primary MD. ? ?Please request your Primary MD to go over all Hospital Tests and Procedure/Radiological results at the follow up, please get all Hospital records sent to your Prim MD by signing hospital release before you go home. ? ?In some cases, there will be blood work, cultures and biopsy results pending at the time of your discharge. Please request that your primary care M.D. goes through all the records of your hospital data and follows up on these results. ? ?If you had Pneumonia of Lung problems at the Hospital: ?Please get a 2 view Chest X ray done in 6-8 weeks after hospital discharge or sooner if instructed by your Primary MD. ? ?If you have Congestive Heart Failure: ?Please call your Cardiologist or Primary MD anytime you have any of the following symptoms:  ?1) 3 pound weight gain in 24 hours or 5 pounds in 1 week  ?2) shortness of breath, with or without a dry hacking cough  ?3) swelling in the hands, feet or stomach  ?4) if you have to sleep on extra pillows at night in order to breathe ? ?Follow cardiac low salt diet and 1.5 lit/day fluid restriction. ? ?If you have diabetes ?Accuchecks 4 times/day, Once in AM empty stomach and then before each meal. ?Log in all results and show them to your primary doctor at your next visit. ?If any glucose reading is under 80 or above 300 call your primary MD immediately. ? ?If you have Seizure/Convulsions/Epilepsy: ?Please do not drive, operate heavy machinery, participate in activities at heights or participate in high speed sports until you have seen by Primary MD or a Neurologist and advised to do so again. ?Per [facility] statutes,  patients with seizures are not allowed to drive until they have been seizure-free for six months.  ?Use caution when using heavy equipment or power tools. Avoid working on ladders or at heights. Take showers instead of baths. Ensure the water temperature is not too high on the home water heater. Do not go swimming alone. Do not lock yourself in a room alone (i.e. bathroom). When caring for infants or small children, sit down when holding, feeding, or changing them to minimize risk of injury to the child in the event you have a seizure. Maintain good sleep hygiene. Avoid alcohol.  ? ?If you had Gastrointestinal Bleeding: ?Please ask your Primary MD to check a complete blood count within one week of discharge or at your next visit. Your endoscopic/colonoscopic biopsies that are pending at the time of discharge, will also need to followed by your Primary MD. ? ?Get Medicines reviewed and adjusted. ?Please take all your medications with you for your next visit with your Primary MD ? ?Please request your Primary MD to go over all hospital tests and procedure/radiological results at the follow up, please ask your Primary MD to get all Hospital records sent to his/her office. ? ?If you experience worsening of your admission symptoms, develop shortness of breath, life threatening emergency, suicidal or homicidal thoughts you must seek medical attention immediately by calling 911 or calling your MD immediately  if symptoms less severe. ? ?You must read complete  instructions/literature along with all the possible adverse reactions/side effects for all the Medicines you take and that have been prescribed to you. Take any new Medicines after you have completely understood and accpet all the possible adverse reactions/side effects.  ? ?Do not drive or operate heavy machinery when taking Pain medications.  ? ?Do not take more than prescribed Pain, Sleep and Anxiety Medications ? ?Special Instructions: If you have smoked or chewed  Tobacco  in the last 2 yrs please stop smoking, stop any regular Alcohol  and or any Recreational drug use. ? ?Wear Seat belts while driving. ? ?Please note ?You were cared for by a hospitalist during your hospital stay. If you have any questions about your discharge medications or the care you received while you were in the hospital after you are discharged, you can call the unit and asked to speak with the hospitalist on call if the hospitalist that took care of you is not available. Once you are discharged, your primary care physician will handle any further medical issues. Please note that NO REFILLS for any discharge medications will be authorized once you are discharged, as it is imperative that you return to your primary care physician (or establish a relationship with a primary care physician if you do not have one) for your aftercare needs so that they can reassess your need for medications and monitor your lab values. ? ?You can reach the hospitalist office at phone [phone] or fax [phone] ?  ?If you do not have a primary care physician, you can call [phone] for a physician referral. ? ?Activity: As tolerated with Full fall precautions use walker/cane & assistance as needed ? ?  ?Diet: soft diet for one week then a high fiber diet long term. ? ?Disposition Home  ?

## 2022-03-31 ENCOUNTER — Encounter (HOSPITAL_COMMUNITY): Payer: Self-pay | Admitting: Gastroenterology

## 2022-03-31 ENCOUNTER — Telehealth: Payer: Self-pay

## 2022-03-31 LAB — SURGICAL PATHOLOGY

## 2022-03-31 NOTE — Telephone Encounter (Signed)
Transition Care Management Follow-up Telephone Call ?  ?Date of discharge and from where:[facility] on 03/30/2022 ?How have you been since you were released from the hospital? Felling better  ?Any questions or concerns? No questions/concerns reported.  ?Items Reviewed: ?Did the pt receive and understand the discharge instructions provided? has the instructions and have no questions.  ?Medications obtained and verified? None dispensed ?Any new allergies since your discharge? None reported  ?Do you have support at home? Yes, daughter ?Other (ie: DME, Home Health, etc)     None ?  ?Functional Questionnaire: (I = Independent and D = Dependent) ?ADL's:  Independent.    ?  ?  ?Follow up appointments reviewed: ?  ??PCP Hospital f/u appt confirmed? NP Geryl Rankins on 04/10/2022 @ 1050.  ??[facility] f/u appt confirmed? None scheduled at this time  ??Are transportation arrangements needed? have transportation   ??If their condition worsens, is the pt aware to call  their PCP or go to the ED? Yes ??Was the patient provided with contact information for the PCP's office or ED? He has the phone number  ??Was the pt encouraged to call back with questions or concerns?yes ?

## 2022-04-01 ENCOUNTER — Encounter: Payer: Self-pay | Admitting: Gastroenterology

## 2022-04-10 ENCOUNTER — Encounter: Payer: Self-pay | Admitting: Nurse Practitioner

## 2022-04-10 ENCOUNTER — Ambulatory Visit: Payer: Medicare PPO | Attending: Nurse Practitioner | Admitting: Nurse Practitioner

## 2022-04-10 VITALS — BP 99/65 | HR 86 | Temp 98.6°F | Resp 16 | Ht 66.0 in | Wt 167.0 lb

## 2022-04-10 DIAGNOSIS — I10 Essential (primary) hypertension: Secondary | ICD-10-CM

## 2022-04-10 DIAGNOSIS — R8290 Unspecified abnormal findings in urine: Secondary | ICD-10-CM

## 2022-04-10 DIAGNOSIS — Z09 Encounter for follow-up examination after completed treatment for conditions other than malignant neoplasm: Secondary | ICD-10-CM | POA: Diagnosis not present

## 2022-04-10 DIAGNOSIS — I5022 Chronic systolic (congestive) heart failure: Secondary | ICD-10-CM

## 2022-04-10 DIAGNOSIS — N1831 Chronic kidney disease, stage 3a: Secondary | ICD-10-CM

## 2022-04-10 LAB — POCT URINALYSIS DIP (CLINITEK)
Blood, UA: NEGATIVE
Glucose, UA: NEGATIVE mg/dL
Nitrite, UA: NEGATIVE
POC PROTEIN,UA: 100 — AB
Spec Grav, UA: >=1.03 — AB (ref 1.010–1.025)
Urobilinogen, UA: 0.2 E.U./dL
pH, UA: 5.5 (ref 5.0–8.0)

## 2022-04-10 MED ORDER — FUROSEMIDE 40 MG PO TABS: 40.0000 mg | ORAL_TABLET | Freq: Every day | ORAL | 1 refills | Status: DC

## 2022-04-10 NOTE — Progress Notes (Signed)
HFU- no GI bleeding. Stopped before leaving hospital                  ?Feels dizzy majority of the time ?

## 2022-04-10 NOTE — Progress Notes (Signed)
? ?Assessment & Plan:  ?Tracy James was seen today for hospitalization follow-up. ? ?Diagnoses and all orders for this visit: ? ?Hospital discharge follow-up ?Doing well. Denies BRBPR. Check CBC and CMP today.  ? ?Essential hypertension ?-     CMP14+EGFR ?-     furosemide (LASIX) 40 MG tablet; Take 1 tablet (40 mg total) by mouth daily. ? ?Chronic kidney disease, stage 3a ([place]) ?-     CMP14+EGFR ?-     CBC ?-     furosemide (LASIX) 40 MG tablet; Take 1 tablet (40 mg total) by mouth daily. ?-     POCT URINALYSIS DIP (CLINITEK) ? ?Chronic systolic CHF (congestive heart failure) (HCC) ?-     furosemide (LASIX) 40 MG tablet; Take 1 tablet (40 mg total) by mouth daily. ? ? ? ?Patient has been counseled on age-appropriate routine health concerns for screening and prevention. These are reviewed and up-to-date. Referrals have been placed accordingly. Immunizations are up-to-date or declined.    ?Subjective:  ? ?Chief Complaint  ?Patient presents with  ? Hospitalization Follow-up  ?   ?  ? ?HPI ?Tracy James 71 y.o. female presents to office today for HFU ? ?She has a PMH of pAF on Tikosyn and Eliquis, sdCHF most recent EF 55%, tobacco dependence, HTN, CKD IIIb baseline 1.5, right ovarian cystic mass, and aortic stenosis  ? ? ?HFU ?Tracy James was hospitalized from 4-24 through 4-27 after presenting with complaints of bloody bowel movements. In the ER, Hgb 13.2, BP 113/74 (relative to baseline elevated).  Cr and electrolytes normal.  DRE showed "maroon" stool per EDP.  Started on Protonix and admitted for GI bleed. She underwent a colonoscopy on 4/27 after Eliquis washout, which showed one 6 mm polyp in the cecum, removed, as well as severe diverticulosis in the left colon. Clinically it appeared that she had a diverticular bleed.  Upon discharge bleeding had resolved, and her hemoglobin remained stable.  She did not require blood transfusions and was discharged with instructions to resume eliquis 1 week after discharge.     ? ?Today she states she is doing well. Denies any BRBPR or abdominal pain. She has resumed medications as prescribed however she does report only taking hydralazine twice a day and not TID as prescribed. She is aware of how it should be taken. Based on soft BP today I would agree with her only taking twice a day as well for now.  ?BP Readings from Last 3 Encounters:  ?04/10/22 99/65  ?03/30/22 (!) 127/52  ?03/17/22 (!) 100/50  ?  ? ?Review of Systems  ?Constitutional:  Negative for fever, malaise/fatigue and weight loss.  ?HENT: Negative.  Negative for nosebleeds.   ?Eyes: Negative.  Negative for blurred vision, double vision and photophobia.  ?Respiratory: Negative.  Negative for cough and shortness of breath.   ?Cardiovascular: Negative.  Negative for chest pain, palpitations and leg swelling.  ?Gastrointestinal: Negative.  Negative for heartburn, nausea and vomiting.  ?Musculoskeletal: Negative.  Negative for myalgias.  ?Neurological:  Positive for dizziness (lightheadedness). Negative for focal weakness, seizures and headaches.  ?Psychiatric/Behavioral: Negative.  Negative for suicidal ideas.   ? ?Past Medical History:  ?Diagnosis Date  ? Aortic stenosis   ? Echo 06/2019: mod AS, mean 28 mmHg, peak velocity 3.6 m/s // Echo 9/21: EF 60-65 no RWMA, moderate LVH, normal RVSF, trivial MR, trivial AI, moderate aortic stenosis (mean gradient 36 mmHg; V-max 3.64 m/s; DI 0.18) [reviewed with Dr. Burt Knack - mod AS; rpt echo  1 yr]    ? CHF (congestive heart failure) ([place])   ? Chronic systolic and diastolic  ? CKD (chronic kidney disease)   ? CKD IIIb  ? Hypertension   ? Nonischemic cardiomyopathy   ? Tachy induced CM // TEE 12/2018: EF 30-35 // Echo 06/2019: EF 55-60 // Cor CTA 8/18: Ca score 0; no CAD  ? Persistent atrial fibrillation   ? Admx 12/19 w AF w RVR // R+L atrial thrombus on TEE - DCCV canceled; repeat TEE in 01/2019 w some residual clot // Dofetilide Rx started 07/2019 >> NSR restored without DCCV // CHADS-VASc 4  (6 if include LAA clot) >> Apixaban   ? ? ?Past Surgical History:  ?Procedure Laterality Date  ? COLONOSCOPY WITH PROPOFOL N/A 03/30/2022  ? Procedure: COLONOSCOPY WITH PROPOFOL;  Surgeon: Ladene Artist, MD;  Location: [place];  Service: Gastroenterology;  Laterality: N/A;  ? POLYPECTOMY  03/30/2022  ? Procedure: POLYPECTOMY;  Surgeon: Ladene Artist, MD;  Location: [place];  Service: Gastroenterology;;  ? TEE WITHOUT CARDIOVERSION N/A 12/06/2018  ? Procedure: TRANSESOPHAGEAL ECHOCARDIOGRAM (TEE);  Surgeon: Lelon Perla, MD;  Location: [facility] ENDOSCOPY;  Service: Cardiovascular;  Laterality: N/A;  ? TEE WITHOUT CARDIOVERSION N/A 01/20/2019  ? Procedure: TRANSESOPHAGEAL ECHOCARDIOGRAM (TEE);  Surgeon: [name], MD;  Location: [facility] ENDOSCOPY;  Service: Cardiovascular;  Laterality: N/A;  ? TONSILLECTOMY    ? ? ?Family History  ?Problem Relation Age of Onset  ? Arthritis Mother   ? Cancer Mother   ?     colon cancer  ? Diabetes Mother   ? Hypertension Mother   ? Heart murmur Mother   ? Early death Brother   ? Heart murmur Brother   ? Lung cancer Brother   ? Arthritis Maternal Grandmother   ? Heart disease Maternal Grandmother   ?     massive heart attack  ? Heart disease Paternal Grandmother   ? Asthma Brother   ? Diabetes Brother   ? Hypertension Brother   ? Heart murmur Brother   ? Heart disease Father   ?     anuersym- ruptered  ? ? ?Social History Reviewed with no changes to be made today.  ? ?Outpatient Medications Prior to Visit  ?Medication Sig Dispense Refill  ? acetaminophen (TYLENOL) 500 MG tablet Take 500 mg by mouth every 6 (six) hours as needed (for pain.).    ? atorvastatin (LIPITOR) 20 MG tablet Take 1 tablet (20 mg total) by mouth daily. 90 tablet 1  ? calcium-vitamin D (OSCAL WITH D) 500-200 MG-UNIT TABS tablet Take 1 tablet by mouth daily.     ? dofetilide (TIKOSYN) 250 MCG capsule Take 1 capsule by mouth twice daily (Patient taking differently: Take 250 mcg by mouth 2 (two) times  daily.) 180 capsule 2  ? ELIQUIS 5 MG TABS tablet TAKE 1 TABLET BY MOUTH EVERY 12 HOURS (Patient taking differently: Take 5 mg by mouth 2 (two) times daily.) 180 tablet 2  ? hydrALAZINE (APRESOLINE) 50 MG tablet TAKE 1 TABLET BY MOUTH THREE TIMES DAILY (Patient taking differently: Take 50 mg by mouth 3 (three) times daily.) 270 tablet 1  ? metoprolol tartrate (LOPRESSOR) 25 MG tablet Take 1 tablet (25 mg total) by mouth 2 (two) times daily. 180 tablet 3  ? mometasone (ELOCON) 0.1 % ointment Apply 1 application. topically daily as needed (for skin rash).    ? nitroGLYCERIN (NITROSTAT) 0.4 MG SL tablet Place 1 tablet (0.4 mg total) under the  tongue every 5 (five) minutes as needed for chest pain. 20 tablet 0  ? Omega-3 1000 MG CAPS Take 1,000 mg by mouth daily.     ? VITAMIN D PO Take 1 tablet by mouth daily.    ? furosemide (LASIX) 40 MG tablet Take 1 tablet by mouth once daily (Patient taking differently: Take 40 mg by mouth daily.) 90 tablet 1  ? loratadine (CLARITIN) 10 MG tablet Take 10 mg by mouth daily as needed for allergies. (Patient not taking: Reported on 04/10/2022)    ? Olopatadine HCl 0.2 % SOLN Apply 1 drop to eye daily. One drop to affected eye once daily (Patient not taking: Reported on 04/10/2022) 2.5 mL 2  ? ?No facility-administered medications prior to visit.  ? ? ?No Known Allergies ? ?   ?Objective:  ?  ?BP 99/65 (BP Location: Left Arm, Patient Position: Sitting, Cuff Size: Normal)   Pulse 86   Temp 98.6 ?F (37 ?C) (Oral)   Resp 16   Ht $R'5\' 6"'AR$  (1.676 m)   Wt 167 lb (75.8 kg)   SpO2 99%   BMI 26.95 kg/m?  ?Wt Readings from Last 3 Encounters:  ?04/10/22 167 lb (75.8 kg)  ?03/30/22 178 lb 12.7 oz (81.1 kg)  ?03/17/22 173 lb 12.8 oz (78.8 kg)  ? ? ?Physical Exam ?Vitals and nursing note reviewed.  ?Constitutional:   ?   Appearance: She is well-developed.  ?HENT:  ?   Head: Normocephalic and atraumatic.  ?Cardiovascular:  ?   Rate and Rhythm: Normal rate and regular rhythm.  ?   Heart sounds:  Normal heart sounds. No murmur heard. ?  No friction rub. No gallop.  ?Pulmonary:  ?   Effort: Pulmonary effort is normal. No tachypnea or respiratory distress.  ?   Breath sounds: Normal breath sounds. No decreased br

## 2022-04-11 LAB — CMP14+EGFR
ALT: 26 IU/L (ref 0–32)
AST: 28 IU/L (ref 0–40)
Albumin/Globulin Ratio: 1.4 (ref 1.2–2.2)
Albumin: 4.2 g/dL (ref 3.8–4.8)
Alkaline Phosphatase: 102 IU/L (ref 44–121)
BUN/Creatinine Ratio: 14 (ref 12–28)
BUN: 27 mg/dL (ref 8–27)
Bilirubin Total: 0.5 mg/dL (ref 0.0–1.2)
CO2: 24 mmol/L (ref 20–29)
Calcium: 10.6 mg/dL — ABNORMAL HIGH (ref 8.7–10.3)
Chloride: 99 mmol/L (ref 96–106)
Creatinine, Ser: 1.87 mg/dL — ABNORMAL HIGH (ref 0.57–1.00)
Globulin, Total: 3.1 g/dL (ref 1.5–4.5)
Glucose: 95 mg/dL (ref 70–99)
Potassium: 4.6 mmol/L (ref 3.5–5.2)
Sodium: 144 mmol/L (ref 134–144)
Total Protein: 7.3 g/dL (ref 6.0–8.5)
eGFR: 29 mL/min/{1.73_m2} — ABNORMAL LOW (ref >59)

## 2022-04-11 LAB — CBC
Hematocrit: 36.5 % (ref 34.0–46.6)
Hemoglobin: 13 g/dL (ref 11.1–15.9)
MCH: 30.1 pg (ref 26.6–33.0)
MCHC: 35.6 g/dL (ref 31.5–35.7)
MCV: 85 fL (ref 79–97)
Platelets: 270 10*3/uL (ref 150–450)
RBC: 4.32 x10E6/uL (ref 3.77–5.28)
RDW: 13.7 % (ref 11.7–15.4)
WBC: 9.2 10*3/uL (ref 3.4–10.8)

## 2022-04-12 ENCOUNTER — Other Ambulatory Visit: Payer: Self-pay | Admitting: Nurse Practitioner

## 2022-04-12 DIAGNOSIS — R7989 Other specified abnormal findings of blood chemistry: Secondary | ICD-10-CM

## 2022-04-23 ENCOUNTER — Telehealth: Payer: Self-pay | Admitting: Internal Medicine

## 2022-04-23 NOTE — Telephone Encounter (Signed)
Please contact the patient on Monday to assess her bleeding and any other symptoms.

## 2022-04-23 NOTE — Telephone Encounter (Signed)
Received a call to the [facility] GI on-call pager.  Patient states that she has had some recurrent rectal bleeding.  This is similar to what she was admitted for last month, which was attributed to a diverticular bleed.  The bleeding occurs with passage of a stool and she sees scant drops of blood in her underwear.  I recommended that the patient monitor closely for lightheadedness or shortness of breath.  If she has worsening of the rectal bleeding or develops lightheadedness or shortness of breath, then she should proceed to the ED for further evaluation.  In the meantime the patient states that she has an appointment with her PCP tomorrow so I asked that she request that her blood counts be checked at that time.  I also told her to hold her Eliquis for now and to contact her cardiologist tomorrow to see what to do about the Eliquis.  We will CC Dr. Fuller Plan to this telephone message.

## 2022-04-24 ENCOUNTER — Telehealth: Payer: Self-pay | Admitting: Cardiovascular Disease

## 2022-04-24 ENCOUNTER — Ambulatory Visit: Payer: Self-pay | Admitting: *Deleted

## 2022-04-24 NOTE — Telephone Encounter (Signed)
Spoke with the patient who states that she had some spotting of blood from her rectum yesterday. She called the ER who advised her to call GI. GI told her to hold her Eliquis and contact cardiology. Patient did not take Eliquis last night or this morning. She states that she is still having some spotting today. She was recently hospitalized for GI bleed back in April. She states this is not near as severe as prior. Advised that I would let Dr. Burt Knack know but that she would most likely need to start back Elqiuis and see her GI doctor. She does have an appointment with her PCP tomorrow.

## 2022-04-24 NOTE — Telephone Encounter (Signed)
Ok to hold eliquis x 48 hours. Would restart after that if bleeding has subsided. Agree with PCP follow-up as arranged tomorrow. Should have CBC checked at that time. May need to consider Watchman implant to avoid long-term anticoagulation.

## 2022-04-24 NOTE — Telephone Encounter (Signed)
Spoke with the patient and advised her on recommendations from Dr. Burt Knack. Patient verbalized understanding.

## 2022-04-24 NOTE — Telephone Encounter (Signed)
See cardiology note 04/24/22, patient was advised by their office.

## 2022-04-24 NOTE — Telephone Encounter (Signed)
Unless Cardiology advises differently she can resume her Eliquis on Wednesday 5/24.

## 2022-04-24 NOTE — Telephone Encounter (Signed)
Reason for Disposition  MILD rectal bleeding (more than just a few drops or streaks)  Answer Assessment - Initial Assessment Questions 1. APPEARANCE of BLOOD: "What color is it?" "Is it passed separately, on the surface of the stool, or mixed in with the stool?"      Started yesterday   small spots of blood. Went to hospital for rectal bleeding 3 weeks ago.   A colonoscopy done.   They thought it was from medicine.   I'm on Eliquis.  I called ED yesterday and they told me since my appt was so close with [facility] to stop the Eliquis.     2. AMOUNT: "How much blood was passed?"      Not having to wear pads. 3. FREQUENCY: "How many times has blood been passed with the stools?"      Yesterday and this morning 4. ONSET: "When was the blood first seen in the stools?" (Days or weeks)      Yesterday   5. DIARRHEA: "Is there also some diarrhea?" If Yes, ask: "How many diarrhea stools in the past 24 hours?"      No diarrhea or constipation 6. CONSTIPATION: "Do you have constipation?" If Yes, ask: "How bad is it?"     No 7. RECURRENT SYMPTOMS: "Have you had blood in your stools before?" If Yes, ask: "When was the last time?" and "What happened that time?"      Yes 8. BLOOD THINNERS: "Do you take any blood thinners?" (e.g., Coumadin/warfarin, Pradaxa/dabigatran, aspirin)     Eliquis   9. OTHER SYMPTOMS: "Do you have any other symptoms?"  (e.g., abdomen pain, vomiting, dizziness, fever)     I'm a little lightheaded off and on.   I've been having that for a while before I started bleeding. 10. PREGNANCY: "Is there any chance you are pregnant?" "When was your last menstrual period?"       N/A  Protocols used: Rectal Bleeding-A-AH

## 2022-04-24 NOTE — Telephone Encounter (Signed)
Called to check in with patient & she says she has not had any further bleeding or complaints of other symptoms. She has already contacted cardiology in regards to holding Eliquis, and was waiting on a call back. Patient has been advised to call back if she has any questions or concerns, and to be seen in ED if bleeding returns or develops any SOB/lightheadedness.

## 2022-04-24 NOTE — Telephone Encounter (Signed)
Called and introduced myself to Tracy James as the [employer]. Explained a brief overview of Watchman. She seems interested if bleeding recurs. Gave the patient my direct line and instructed her to call if bleeding starts again and she would like to discuss Watchman implant with Dr. Burt Knack. She was grateful for call and agrees with plan.

## 2022-04-24 NOTE — Telephone Encounter (Signed)
  Pt c/o medication issue:  1. Name of Medication: ELIQUIS 5 MG TABS tablet  2. How are you currently taking this medication (dosage and times per day)? TAKE 1 TABLET BY MOUTH EVERY 12 HOURSPatient taking differently: Take 5 mg by mouth 2 (two) times daily.  3. Are you having a reaction (difficulty breathing--STAT)?   4. What is your medication issue? Pt is seeing little spots of blood in her rectum. She called ED and her OBGYN and was advised to stopped her eliquis. She called her pcp today and was advised to call heart doctor to get further recommendation about her eliquis, how long she needs to held the medication

## 2022-04-24 NOTE — Telephone Encounter (Signed)
Patient is returning RN's call.

## 2022-04-24 NOTE — Telephone Encounter (Signed)
Pt called in c/o rectal spotting that started yesterday.   She called the ED yesterday and her GI dr on the emergency line.  ED staff told her to stop the Eliquis which she has done.   Dr. Lynne Leader office, GI dr. told her to call her cardiologist for further instructions regarding the Eliquis today.  She has stopped the Eliquis as of last night.  There is a note from Dr. Fuller Plan, GI for someone from his office to follow up with pt. Today (Monday).    Pt did not know she was supposed to call cardiology so I let her know and gave her the number for Thompson Grayer, MD at [facility].  Pt verbalized she would call cardiology.  There are no appts available at [facility] this week after checking with Selina Cooley, flow coordinator.   I went over the s/s for pt to return to the ED.  Increased bleeding, abd pain, dizziness.   Pt verbalized understanding.

## 2022-04-24 NOTE — Telephone Encounter (Signed)
Left message for patient to call back  

## 2022-04-24 NOTE — Telephone Encounter (Signed)
Noted  

## 2022-04-25 ENCOUNTER — Ambulatory Visit: Payer: Medicare PPO | Attending: Family Medicine

## 2022-04-25 DIAGNOSIS — R7989 Other specified abnormal findings of blood chemistry: Secondary | ICD-10-CM

## 2022-04-25 DIAGNOSIS — R8290 Unspecified abnormal findings in urine: Secondary | ICD-10-CM | POA: Diagnosis not present

## 2022-04-26 ENCOUNTER — Other Ambulatory Visit: Payer: Self-pay | Admitting: Nurse Practitioner

## 2022-04-26 DIAGNOSIS — N3000 Acute cystitis without hematuria: Secondary | ICD-10-CM

## 2022-04-26 LAB — URINALYSIS, COMPLETE
Bilirubin, UA: NEGATIVE
Glucose, UA: NEGATIVE
Ketones, UA: NEGATIVE
Nitrite, UA: POSITIVE — AB
Protein,UA: NEGATIVE
RBC, UA: NEGATIVE
Specific Gravity, UA: 1.01 (ref 1.005–1.030)
Urobilinogen, Ur: 0.2 mg/dL (ref 0.2–1.0)
pH, UA: 6.5 (ref 5.0–7.5)

## 2022-04-26 LAB — MICROSCOPIC EXAMINATION: Casts: NONE SEEN /lpf

## 2022-04-26 LAB — BASIC METABOLIC PANEL
BUN/Creatinine Ratio: 12 (ref 12–28)
BUN: 16 mg/dL (ref 8–27)
CO2: 23 mmol/L (ref 20–29)
Calcium: 9.5 mg/dL (ref 8.7–10.3)
Chloride: 102 mmol/L (ref 96–106)
Creatinine, Ser: 1.29 mg/dL — ABNORMAL HIGH (ref 0.57–1.00)
Glucose: 121 mg/dL — ABNORMAL HIGH (ref 70–99)
Potassium: 4.1 mmol/L (ref 3.5–5.2)
Sodium: 141 mmol/L (ref 134–144)
eGFR: 45 mL/min/{1.73_m2} — ABNORMAL LOW (ref >59)

## 2022-04-26 MED ORDER — CEPHALEXIN 500 MG PO CAPS: 500.0000 mg | ORAL_CAPSULE | Freq: Two times a day (BID) | ORAL | 0 refills | Status: AC

## 2022-05-17 ENCOUNTER — Other Ambulatory Visit: Payer: Self-pay | Admitting: Family Medicine

## 2022-05-17 ENCOUNTER — Other Ambulatory Visit: Payer: Self-pay | Admitting: Physician Assistant

## 2022-05-17 DIAGNOSIS — I5022 Chronic systolic (congestive) heart failure: Secondary | ICD-10-CM

## 2022-05-17 NOTE — Telephone Encounter (Signed)
Requested Prescriptions  Pending Prescriptions Disp Refills  . atorvastatin (LIPITOR) 20 MG tablet [Pharmacy Med Name: Atorvastatin Calcium 20 MG Oral Tablet] 90 tablet 0    Sig: Take 1 tablet by mouth once daily     Cardiovascular:  Antilipid - Statins Failed - 05/17/2022  2:56 PM      Failed - Lipid Panel in normal range within the last 12 months    Cholesterol, Total  Date Value Ref Range Status  10/19/2021 144 100 - 199 mg/dL Final   LDL Cholesterol (Calc)  Date Value Ref Range Status  09/26/2017 95 mg/dL (calc) Final    Comment:    Reference range: <100 . Desirable range <100 mg/dL for primary prevention;   <70 mg/dL for patients with CHD or diabetic patients  with > or = 2 CHD risk factors. Marland Kitchen LDL-C is now calculated using the Martin-Hopkins  calculation, which is a validated novel method providing  better accuracy than the Friedewald equation in the  estimation of LDL-C.  Cresenciano Genre et al. Annamaria Helling. 7412;878(67): 2061-2068  (http://education.[facility].com/faq/FAQ164)    LDL Chol Calc (NIH)  Date Value Ref Range Status  10/19/2021 81 0 - 99 mg/dL Final   HDL  Date Value Ref Range Status  10/19/2021 40 >39 mg/dL Final   Triglycerides  Date Value Ref Range Status  10/19/2021 126 0 - 149 mg/dL Final         Passed - Patient is not pregnant      Passed - Valid encounter within last 12 months    Recent Outpatient Visits          1 month ago Hospital discharge follow-up   [place], Vernia Buff, NP   7 months ago Encounter for [employer] annual wellness exam   [place], Charlane Ferretti, MD   1 year ago Allergic conjunctivitis of both eyes   [place], Enobong, MD   1 year ago Encounter for [employer] annual wellness exam   [facility], Darrick Penna, MD   1 year ago DCM (dilated cardiomyopathy) [facility])   [facility], Darrick Penna, MD      Future Appointments            In 2 months Charlott Rakes, MD [place]   In 3 months  [place]

## 2022-05-18 ENCOUNTER — Telehealth: Payer: Self-pay

## 2022-05-18 NOTE — Telephone Encounter (Signed)
Voicemail left on nurse line from [place] from pre-service center at [facility] stating that patient has a pelvic MRI scheduled for 05/22/22. Patient's insurance [employer] requires pre-authorization. I notified Nathaneil Canary who stated she would initiate the pre-authorization. I called Kylie from [facility] back and notified her of this.   Paulina Fusi, RN 05/18/22

## 2022-05-22 ENCOUNTER — Ambulatory Visit
Admission: RE | Admit: 2022-05-22 | Discharge: 2022-05-22 | Disposition: A | Discharge status: Home / Self Care | Payer: Medicare PPO | Source: Ambulatory Visit | Attending: Obstetrics & Gynecology | Admitting: Obstetrics & Gynecology

## 2022-05-22 DIAGNOSIS — K5730 Diverticulosis of large intestine without perforation or abscess without bleeding: Secondary | ICD-10-CM | POA: Diagnosis not present

## 2022-05-22 DIAGNOSIS — D259 Leiomyoma of uterus, unspecified: Secondary | ICD-10-CM | POA: Diagnosis not present

## 2022-05-22 DIAGNOSIS — N838 Other noninflammatory disorders of ovary, fallopian tube and broad ligament: Secondary | ICD-10-CM | POA: Diagnosis not present

## 2022-05-22 IMAGING — MR MR PELVIS WO/W CM
18 of 19 series · 44 of 48 positions shown · IV contrast (gadavist)
Comparison: [DATE]

CLINICAL DATA: Follow-up cystic adnexal mass and fibroids.

EXAM:
MRI PELVIS WITHOUT AND WITH CONTRAST
TECHNIQUE: Multiplanar multisequence MR imaging of the pelvis was performed
both before and after administration of intravenous contrast.
CONTRAST:  7.5mL GADAVIST GADOBUTROL 1 MMOL/ML IV SOLN

[Series 2: T2 · coronal · 5.0mm · 1.56mm/px · 1 of 36 slices shown (1 of 5)]
[im 1/36]
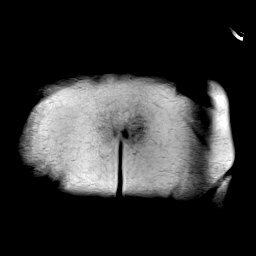

[Series 3: T2 · axial · 5.0mm · 0.47mm/px · 1 of 30 slices shown (2 of 5)]
[im 1/30]
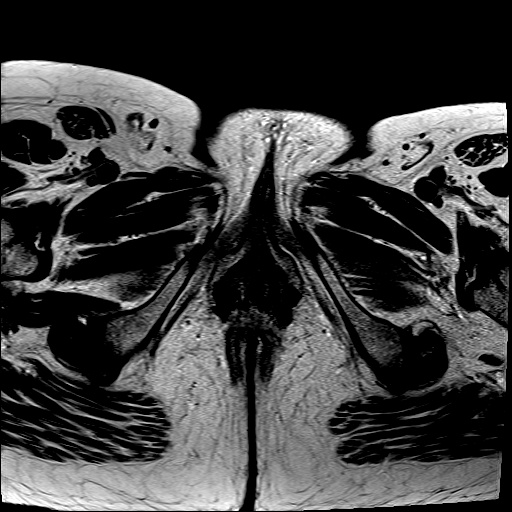

[Series 5: T2 · axial · 5.0mm · 0.47mm/px · 1 of 30 slices shown (3 of 5)]
[im 1/30]
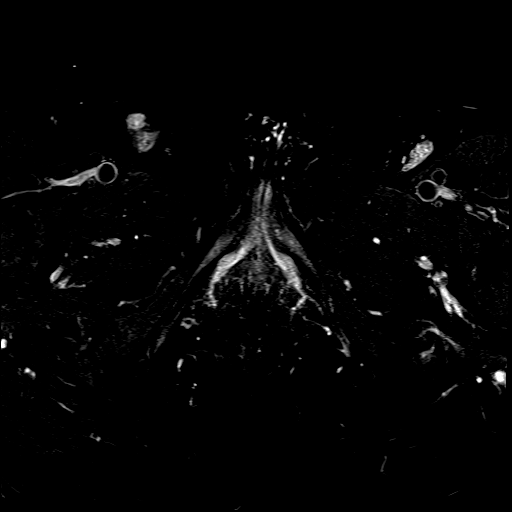

[Series 6: T2 · sagittal · 5.0mm · 0.75mm/px · 1 of 33 slices shown (4 of 5)]
[im 1/33]
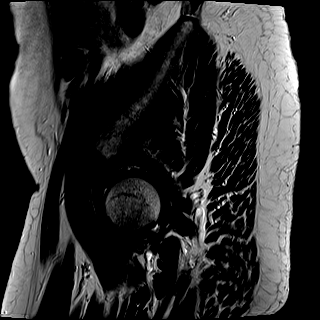

[Series 7: T2 · coronal · 5.0mm · 0.88mm/px · 1 of 33 slices shown (5 of 5)]
[im 1/33]
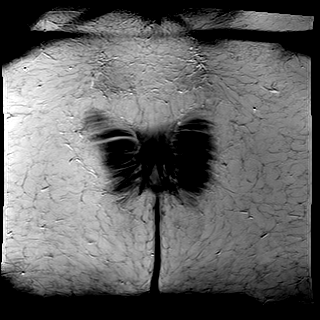

[Series 8: ax dwi_tracew · axial · 5.0mm · 1.42mm/px · z∈[-110,+88]mm · 4 of 102 slices shown]
[im 1/102]
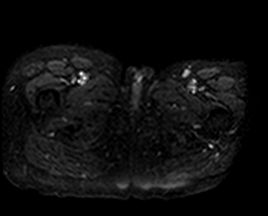
[im 34/102]
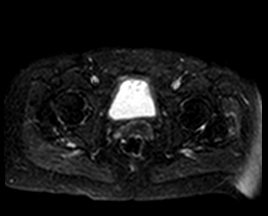
[im 68/102]
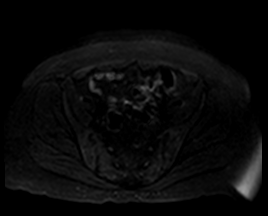
[im 102/102]
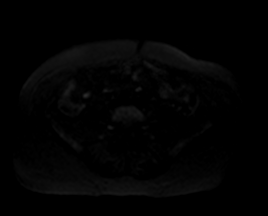

[Series 9: ax dwi_adc · axial · 5.0mm · 1.42mm/px · z∈[-110,+88]mm · 2 of 34 slices shown]
[im 1/34]
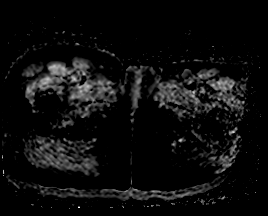
[im 34/34]
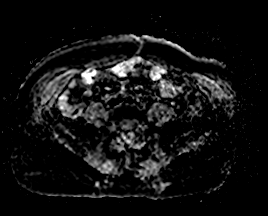

[Series 10: T1 dynamic · axial · 4.0mm · 0.75mm/px · z∈[-113,+91]mm · 3 of 52 slices shown (1 of 9)]
[im 1/52]
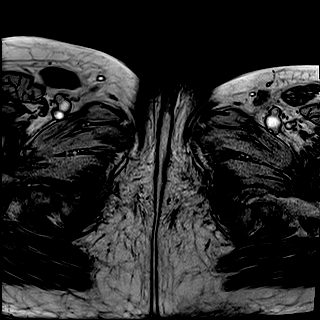
[im 26/52]
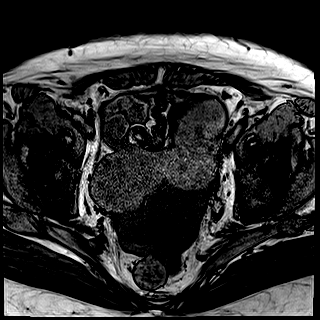
[im 52/52]
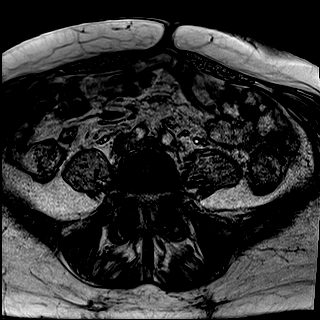

[Series 11: T1 dynamic · axial · 4.0mm · 0.75mm/px · z∈[-113,+91]mm · 3 of 52 slices shown (2 of 9)]
[im 1/52]
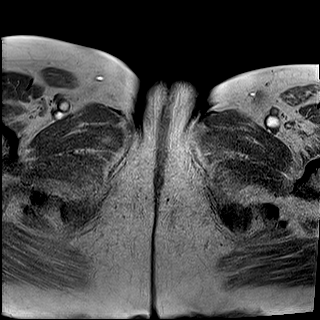
[im 26/52]
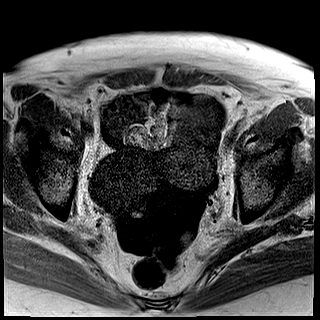
[im 52/52]
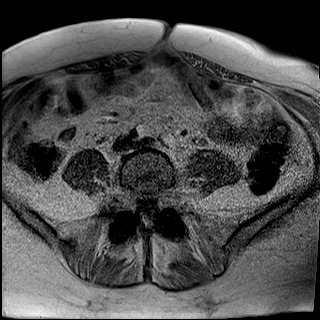

[Series 13: T1 dynamic · axial · 4.0mm · 0.75mm/px · z∈[-113,+91]mm · 3 of 52 slices shown (3 of 9)]
[im 1/52]
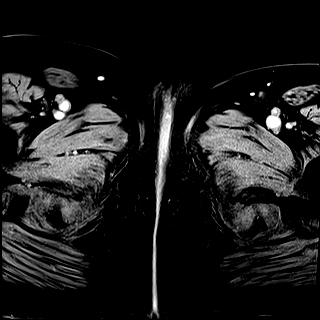
[im 26/52]
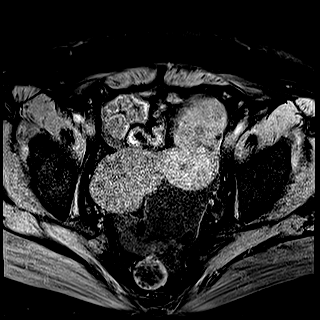
[im 52/52]
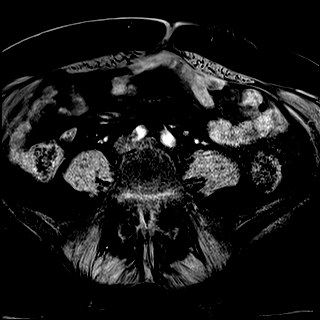

[Series 20: T1 dynamic · axial · 4.0mm · 0.75mm/px · z∈[-113,+91]mm · 3 of 52 slices shown (4 of 9)]
[im 1/52]
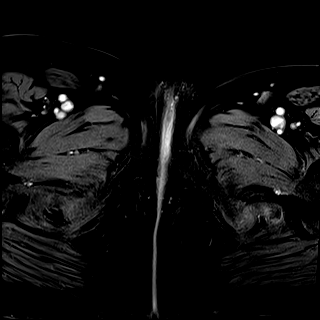
[im 26/52]
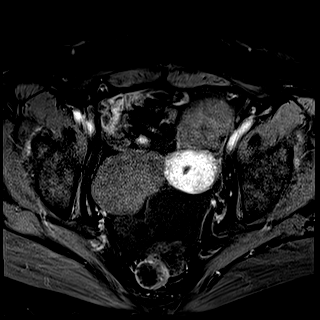
[im 52/52]
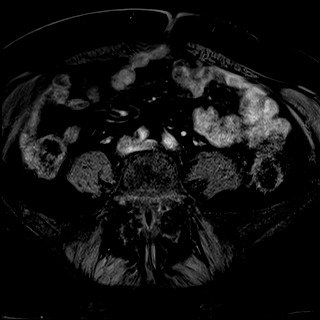

[Series 21: T1 dynamic · axial · 4.0mm · 0.75mm/px · z∈[-113,+91]mm · 3 of 52 slices shown (5 of 9)]
[im 1/52]
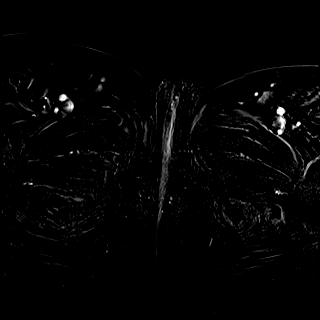
[im 26/52]
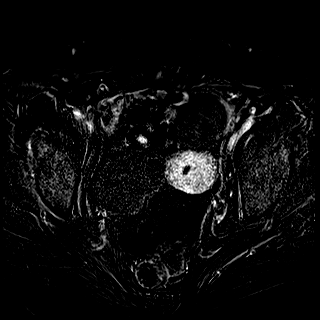
[im 52/52]
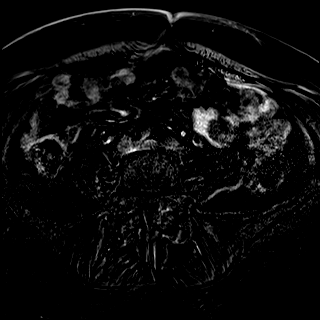

[Series 28: T1 dynamic · axial · 4.0mm · 0.75mm/px · z∈[-113,+91]mm · 3 of 52 slices shown (6 of 9)]
[im 1/52]
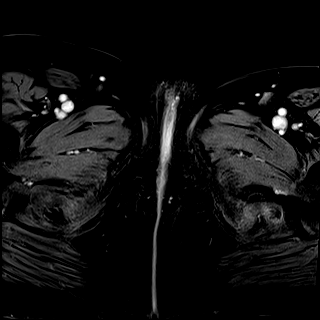
[im 26/52]
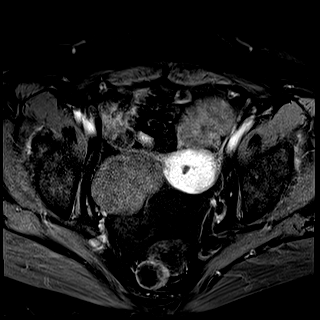
[im 52/52]
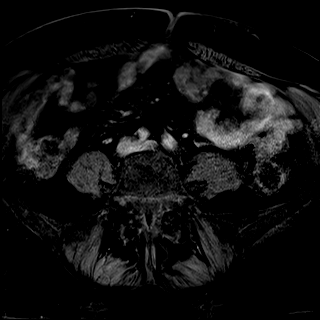

[Series 29: T1 dynamic · axial · 4.0mm · 0.75mm/px · z∈[-113,+91]mm · 3 of 52 slices shown (7 of 9)]
[im 1/52]
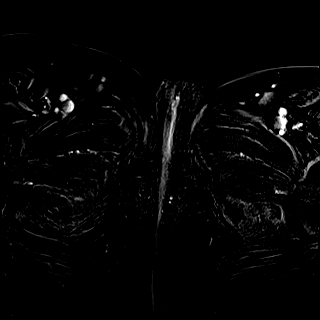
[im 26/52]
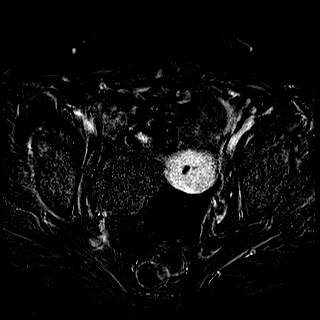
[im 52/52]
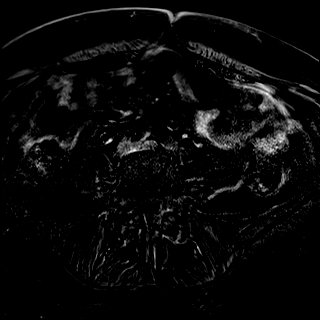

[Series 36: T1 dynamic · axial · 4.0mm · 0.75mm/px · z∈[-113,+91]mm · 3 of 52 slices shown (8 of 9)]
[im 1/52]
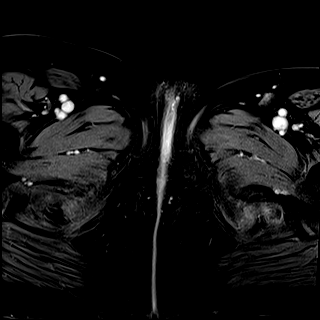
[im 26/52]
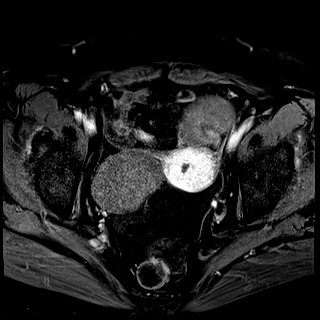
[im 52/52]
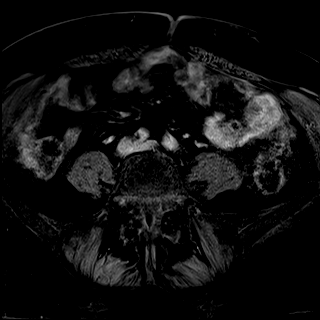

[Series 37: T1 dynamic · axial · 4.0mm · 0.75mm/px · z∈[-113,+91]mm · 3 of 52 slices shown (9 of 9)]
[im 1/52]
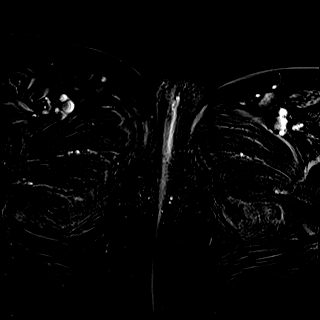
[im 26/52]
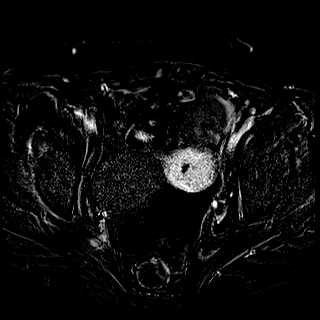
[im 52/52]
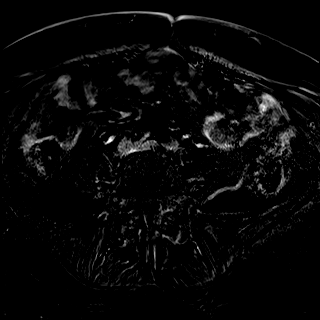

[Series 40: T1 fat-sat post-contrast · sagittal · 3.0mm · 0.88mm/px · 4 of 72 slices shown]
[im 1/72]
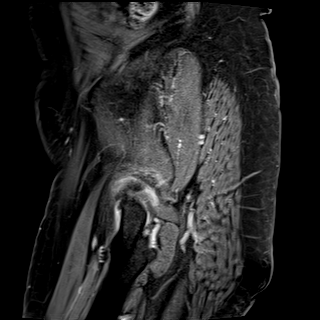
[im 24/72]
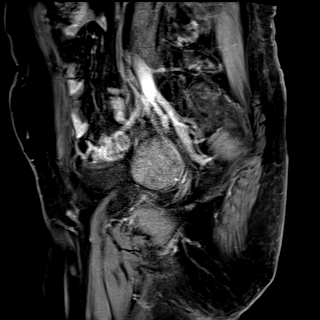
[im 48/72]
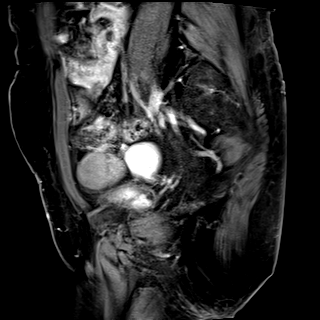
[im 72/72]
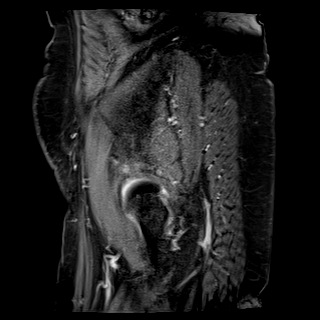

[Series 41: T2 post-contrast · sagittal · 5.0mm · 0.75mm/px · 2 of 33 slices shown]
[im 1/33]
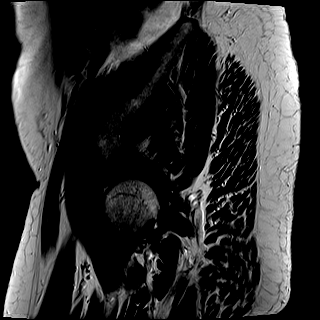
[im 33/33]
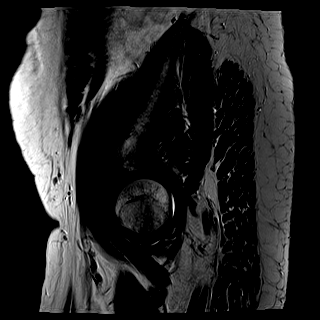

[44 of 48 positions shown; findings below may reference images not displayed]

FINDINGS: Lower Urinary Tract: No urinary bladder or urethral abnormality
identified.

Bowel: Sigmoid diverticulosis, without evidence of diverticulitis.

Vascular/Lymphatic: Unremarkable. No pathologically enlarged pelvic
lymph nodes identified.

Reproductive:

-- Uterus: Measures 6.5 x 3.3 by 4.0 cm (volume = 45 cm^3).

A solid T2 hypointense mass is again seen in the right adnexa, which
is contiguous with the uterus. This measures 5.7 x 4.0 cm, without
change in size or appearance since prior study. This could represent
a pedunculated fibroid, although an ovarian fibrothecoma can have
same appearance.

A similar solid T2 hypointense mass is again seen in the left
anterior adnexal region, which also abuts the uterus. This measures
4.5 x 3.0 cm, also unchanged in size and appearance since previous
study. This could represent a pedunculated fibroid, although an
ovarian fibrothecoma can have the same appearance.

-- Right ovary: No normal ovary visualized.

-- Left ovary:  No normal ovary visualized.

Other: A simple appearing cystic lesion is seen in the cul-de-sac.
This measures 4.7 x 3.9 by 4.0 cm, mildly increased in size from
x 3.3 by 3.4 cm on prior exam. No complex features are demonstrated
by MRI. Small amount of free pelvic fluid, without significant
change.

Musculoskeletal:  Unremarkable.
IMPRESSION: Stable bilateral solid T2 hypointense adnexal masses which are
contiguous with the uterus. Differential diagnosis includes
pedunculated fibroids and ovarian fibrothecoma.

4.7 cm benign-appearing cystic lesion in pelvic cul-de-sac shows
mild increase in size, suspicious for an ovarian or paraovarian
cyst. A benign or low malignant potential cystic ovarian neoplasm
cannot be excluded.

Small amount of free pelvic fluid, without significant change.

Stable bilateral pedunculated uterine fibroids.

## 2022-05-22 MED ORDER — GADOBUTROL 1 MMOL/ML IV SOLN
7.5000 mL | Freq: Once | INTRAVENOUS | Status: AC | PRN
  Administered 2022-05-22: 7.5 mL via INTRAVENOUS

## 2022-05-24 ENCOUNTER — Telehealth: Payer: Self-pay | Admitting: *Deleted

## 2022-05-24 NOTE — Telephone Encounter (Signed)
Pt informed of results and recommendations.

## 2022-05-24 NOTE — Telephone Encounter (Signed)
-----   Message from Osborne Oman, MD sent at 05/23/2022  3:06 PM EDT ----- No significant change from previous MRI, no surgical intervention needed at this point. Will continue to monitor, repeat imaging recommended for any concerning symptoms. Please call to inform patient of results and recommendations.

## 2022-06-15 ENCOUNTER — Encounter (HOSPITAL_COMMUNITY): Payer: Self-pay | Admitting: Physician Assistant

## 2022-06-15 ENCOUNTER — Ambulatory Visit (HOSPITAL_COMMUNITY)
Admission: RE | Admit: 2022-06-15 | Discharge: 2022-06-15 | Disposition: A | Discharge status: Home / Self Care | Payer: Medicare PPO | Source: Ambulatory Visit | Attending: Physician Assistant | Admitting: Physician Assistant

## 2022-06-15 ENCOUNTER — Telehealth: Payer: Self-pay

## 2022-06-15 VITALS — BP 112/62 | HR 69 | Ht 66.0 in | Wt 171.4 lb

## 2022-06-15 DIAGNOSIS — I4819 Other persistent atrial fibrillation: Secondary | ICD-10-CM | POA: Insufficient documentation

## 2022-06-15 DIAGNOSIS — D6869 Other thrombophilia: Secondary | ICD-10-CM | POA: Diagnosis not present

## 2022-06-15 DIAGNOSIS — I429 Cardiomyopathy, unspecified: Secondary | ICD-10-CM | POA: Diagnosis not present

## 2022-06-15 DIAGNOSIS — I350 Nonrheumatic aortic (valve) stenosis: Secondary | ICD-10-CM | POA: Diagnosis not present

## 2022-06-15 DIAGNOSIS — N1832 Chronic kidney disease, stage 3b: Secondary | ICD-10-CM | POA: Diagnosis not present

## 2022-06-15 DIAGNOSIS — I1310 Hypertensive heart and chronic kidney disease without heart failure, with stage 1 through stage 4 chronic kidney disease, or unspecified chronic kidney disease: Secondary | ICD-10-CM | POA: Insufficient documentation

## 2022-06-15 LAB — MAGNESIUM: Magnesium: 2.1 mg/dL (ref 1.7–2.4)

## 2022-06-15 LAB — BASIC METABOLIC PANEL
Anion gap: 9 (ref 5–15)
BUN: 12 mg/dL (ref 8–23)
CO2: 24 mmol/L (ref 22–32)
Calcium: 9.2 mg/dL (ref 8.9–10.3)
Chloride: 108 mmol/L (ref 98–111)
Creatinine, Ser: 1.03 mg/dL — ABNORMAL HIGH (ref 0.44–1.00)
GFR, Estimated: 59 mL/min — ABNORMAL LOW (ref >60)
Glucose, Bld: 89 mg/dL (ref 70–99)
Potassium: 4 mmol/L (ref 3.5–5.1)
Sodium: 141 mmol/L (ref 135–145)

## 2022-06-15 NOTE — Telephone Encounter (Signed)
Per Adline Peals, called patient to follow-up about Watchman.  Left message to call back.

## 2022-06-15 NOTE — Progress Notes (Addendum)
Primary Care Physician: Charlott Rakes, MD Primary Cardiologist: Dr Burt Knack Primary Electrophysiologist: Dr Rayann Heman Referring Physician: Richardson Dopp PA-C   Tracy James is a 71 y.o. female with a history of persistent atrial fibrillation, tachycardia induced cardiomyopathy, AS, HTN, and tobacco abuse who presents for follow up in the [facility]. She was admitted in 11/2018 with AF with RVR.  EF was 40-45 on Echo and TEE demonstrated thrombus in the R and L atria and possible thrombus adjacent to the heart.  After a month of anticoagulation, she underwent repeat TEE-DCCV 01/20/2019.  Unfortunately, the TEE continued to show residual clot in the LA.  Therefore, DCCV was not performed.  She was seen in follow up on 02/2019 and her HR was uncontrolled.  Unfortunately, repeat DCCV was not arranged due to COVID-19 restrictions (elective procedures on hold).  She was placed on Digoxin for rate control. She was admitted 07/15/19-07/18/19 for dofetilide loading. She is on Eliquis for a CHADS2VASC score of 4.   Patient was hospitalized 03/2022 with lower GI bleed, was not transfused. GI performed a colonoscopy and bleeding felt to be diverticular.   On follow up today, patient reports that she has done well from a cardiac standpoint. She is unaware of any afib episodes. No recent bleeding issues.   Today, she denies symptoms of palpitations, chest pain, shortness of breath, orthopnea, PND, lower extremity edema, presyncope, syncope, snoring, daytime somnolence, or neurologic sequela. The patient is tolerating medications without difficulties and is otherwise without complaint today.    Atrial Fibrillation Risk Factors:  she does have symptoms of sleep apnea. she does not have a history of rheumatic fever. she does not have a history of alcohol use. The patient does not have a history of early familial atrial fibrillation or other arrhythmias.  she has a BMI of Body mass  index is 27.66 kg/m.Marland Kitchen Filed Weights   06/15/22 0829  Weight: 77.7 kg     Family History  Problem Relation Age of Onset   Arthritis Mother    Cancer Mother        colon cancer   Diabetes Mother    Hypertension Mother    Heart murmur Mother    Early death Brother    Heart murmur Brother    Lung cancer Brother    Arthritis Maternal Grandmother    Heart disease Maternal Grandmother        massive heart attack   Heart disease Paternal Grandmother    Asthma Brother    Diabetes Brother    Hypertension Brother    Heart murmur Brother    Heart disease Father        anuersym- ruptered     Atrial Fibrillation Management history:  Previous antiarrhythmic drugs: dofetilide Previous cardioversions: none Previous ablations: none CHADS2VASC score: 4 Anticoagulation history: Eliquis   Past Medical History:  Diagnosis Date   Aortic stenosis    Echo 06/2019: mod AS, mean 28 mmHg, peak velocity 3.6 m/s // Echo 9/21: EF 60-65 no RWMA, moderate LVH, normal RVSF, trivial MR, trivial AI, moderate aortic stenosis (mean gradient 36 mmHg; V-max 3.64 m/s; DI 0.18) [reviewed with Dr. Burt Knack - mod AS; rpt echo 1 yr]     CHF (congestive heart failure) (HCC)    Chronic systolic and diastolic   CKD (chronic kidney disease)    CKD IIIb   Hypertension    Nonischemic cardiomyopathy    Tachy induced CM // TEE 12/2018: EF 30-35 // Echo  06/2019: EF 55-60 // Cor CTA 8/18: Ca score 0; no CAD   Persistent atrial fibrillation    Admx 12/19 w AF w RVR // R+L atrial thrombus on TEE - DCCV canceled; repeat TEE in 01/2019 w some residual clot // Dofetilide Rx started 07/2019 >> NSR restored without DCCV // CHADS-VASc 4 (6 if include LAA clot) >> Apixaban    Past Surgical History:  Procedure Laterality Date   COLONOSCOPY WITH PROPOFOL N/A 03/30/2022   Procedure: COLONOSCOPY WITH PROPOFOL;  Surgeon: Ladene Artist, MD;  Location: [facility] ENDOSCOPY;  Service: Gastroenterology;  Laterality: N/A;   POLYPECTOMY   03/30/2022   Procedure: POLYPECTOMY;  Surgeon: Ladene Artist, MD;  Location: [facility] ENDOSCOPY;  Service: Gastroenterology;;   TEE WITHOUT CARDIOVERSION N/A 12/06/2018   Procedure: TRANSESOPHAGEAL ECHOCARDIOGRAM (TEE);  Surgeon: Lelon Perla, MD;  Location: [facility] ENDOSCOPY;  Service: Cardiovascular;  Laterality: N/A;   TEE WITHOUT CARDIOVERSION N/A 01/20/2019   Procedure: TRANSESOPHAGEAL ECHOCARDIOGRAM (TEE);  Surgeon: [name], MD;  Location: [facility] ENDOSCOPY;  Service: Cardiovascular;  Laterality: N/A;   TONSILLECTOMY      Current Outpatient Medications  Medication Sig Dispense Refill   acetaminophen (TYLENOL) 500 MG tablet Take 500 mg by mouth every 6 (six) hours as needed (for pain.).     apixaban (ELIQUIS) 5 MG TABS tablet Take 1 tablet (5 mg total) by mouth 2 (two) times daily. 180 tablet 1   atorvastatin (LIPITOR) 20 MG tablet Take 1 tablet by mouth once daily 90 tablet 0   calcium-vitamin D (OSCAL WITH D) 500-200 MG-UNIT TABS tablet Take 1 tablet by mouth daily.      dofetilide (TIKOSYN) 250 MCG capsule Take 1 capsule by mouth twice daily 180 capsule 2   furosemide (LASIX) 40 MG tablet Take 1 tablet (40 mg total) by mouth daily. 90 tablet 1   hydrALAZINE (APRESOLINE) 50 MG tablet TAKE 1 TABLET BY MOUTH THREE TIMES DAILY 270 tablet 1   loratadine (CLARITIN) 10 MG tablet Take 10 mg by mouth daily as needed for allergies.     metoprolol tartrate (LOPRESSOR) 25 MG tablet Take 1 tablet (25 mg total) by mouth 2 (two) times daily. 180 tablet 3   mometasone (ELOCON) 0.1 % ointment Apply 1 application. topically daily as needed (for skin rash).     nitroGLYCERIN (NITROSTAT) 0.4 MG SL tablet Place 1 tablet (0.4 mg total) under the tongue every 5 (five) minutes as needed for chest pain. 20 tablet 0   Olopatadine HCl 0.2 % SOLN Apply 1 drop to eye daily. One drop to affected eye once daily 2.5 mL 2   Omega-3 1000 MG CAPS Take 1,000 mg by mouth daily.      VITAMIN D PO Take 1 tablet by mouth  daily.     No current facility-administered medications for this encounter.    No Known Allergies  Social History   Socioeconomic History   Marital status: Divorced    Spouse name: Not on file   Number of children: Not on file   Years of education: Not on file   Highest education level: Not on file  Occupational History   Not on file  Tobacco Use   Smoking status: Former    Types: Cigarettes    Quit date: 12/05/2011    Years since quitting: 10.5   Smokeless tobacco: Never   Tobacco comments:     Former smoker 06/15/22  Vaping Use   Vaping Use: Never used  Substance and Sexual Activity  Alcohol use: Not Currently    Alcohol/week: 1.0 standard drink of alcohol    Types: 1 Glasses of wine per week    Comment: stop drinking occ 2 months 06/15/22   Drug use: No   Sexual activity: Not Currently    Birth control/protection: None  Other Topics Concern   Not on file  Social History Narrative   Not on file   Social Determinants of Health   Financial Resource Strain: Not on file  Food Insecurity: Not on file  Transportation Needs: Not on file  Physical Activity: Not on file  Stress: Not on file  Social Connections: Not on file  Intimate Partner Violence: Not on file     ROS- All systems are reviewed and negative except as per the HPI above.  Physical Exam: Vitals:   06/15/22 0829  BP: 112/62  Pulse: 69  Weight: 77.7 kg  Height: '5\' 6"'$  (1.676 m)     GEN- The patient is a well appearing female, alert and oriented x 3 today.   HEENT-head normocephalic, atraumatic, sclera clear, conjunctiva pink, hearing intact, trachea midline. Lungs- Clear to ausculation bilaterally, normal work of breathing Heart- Regular rate and rhythm, no rubs or gallops, 2/6 systolic murmur   GI- soft, NT, ND, + BS Extremities- no clubbing, cyanosis, or edema MS- no significant deformity or atrophy Skin- no rash or lesion Psych- euthymic mood, full affect Neuro- strength and sensation  are intact   Wt Readings from Last 3 Encounters:  06/15/22 77.7 kg  04/10/22 75.8 kg  03/30/22 81.1 kg    EKG today demonstrates  SR Vent. rate 69 BPM PR interval 188 ms QRS duration 76 ms QT/QTcB 472/505 ms (~480 ms manually measured)  Echo 02/21/22 demonstrated   1. Left ventricular ejection fraction, by estimation, is 55 to 60%. The  left ventricle has normal function. The left ventricle has no regional  wall motion abnormalities. There is mild left ventricular hypertrophy.  Left ventricular diastolic parameters are consistent with Grade II diastolic dysfunction (pseudonormalization).   2. Right ventricular systolic function is mildly reduced. The right  ventricular size is normal. Tricuspid regurgitation signal is inadequate  for assessing PA pressure.   3. Left atrial size was moderately dilated.   4. The mitral valve is normal in structure. No evidence of mitral valve  regurgitation. No evidence of mitral stenosis.   5. The aortic valve is tricuspid. There is moderate calcification of the  aortic valve. Aortic valve regurgitation is trivial. Severe aortic valve  stenosis (paradoxical low flow/low gradient severe AS). Aortic valve area, by VTI measures 0.52 cm. Aortic   valve mean gradient measures 34.0 mmHg.   6. The inferior vena cava is normal in size with greater than 50%  respiratory variability, suggesting right atrial pressure of 3 mmHg.   Epic records are reviewed at length today  CHA2DS2-VASc Score = 4  The patient's score is based upon: CHF History: 1 HTN History: 1 Diabetes History: 0 Stroke History: 0 Vascular Disease History: 0 Age Score: 1 Gender Score: 1       ASSESSMENT AND PLAN: 1. Persistent Atrial Fibrillation (ICD10:  I48.19) The patient's CHA2DS2-VASc score is 4, indicating a 4.8% annual risk of stroke.   Patient appears to be maintaining SR. Continue dofetilide 250 mcg BID. QT stable. Continue metoprolol 25 mg BID Continue Eliquis 5 mg  BID Check bmet/mag today.  2. Secondary Hypercoagulable State (ICD10:  D68.69) The patient is at significant risk for stroke/thromboembolism based upon  her CHA2DS2-VASc Score of 4.  Continue Apixaban (Eliquis). H/o diverticular bleeding. We discussed Watchman today, education provided with model and brochure. She is going to think about it and speak to her family. If she decides to proceed, encouraged her to give [place] or Dr Burt Knack or the [facility] navigator a call.   3. Cardiomyopathy  EF recovered No signs or symptoms of fluid overload.  4. HTN Stable, no changes today.  5. Aortic stenosis  Moderately severe stage C Followed by Dr Burt Knack   Follow up in the AF clinic in 6 months.    [facility] [address] [place], [place] [address] [phone] 06/15/2022 9:00 AM

## 2022-06-16 NOTE — Telephone Encounter (Signed)
Spoke with the patient, who wishes to continue thinking about Watchman. She will call if she wants to speak with Dr. Burt Knack prior to her visit with the [facility] APP in September. Office number [phone]) given and confirmed.  She was grateful for call and agrees with plan.

## 2022-07-21 ENCOUNTER — Ambulatory Visit (HOSPITAL_COMMUNITY): Payer: Medicare PPO | Admitting: Physician Assistant

## 2022-07-31 ENCOUNTER — Ambulatory Visit: Payer: Medicare PPO | Attending: Family Medicine | Admitting: Family Medicine

## 2022-07-31 ENCOUNTER — Encounter: Payer: Self-pay | Admitting: Family Medicine

## 2022-07-31 VITALS — BP 132/76 | HR 62 | Ht 66.0 in | Wt 164.8 lb

## 2022-07-31 DIAGNOSIS — L308 Other specified dermatitis: Secondary | ICD-10-CM | POA: Diagnosis not present

## 2022-07-31 DIAGNOSIS — R7303 Prediabetes: Secondary | ICD-10-CM

## 2022-07-31 DIAGNOSIS — G5601 Carpal tunnel syndrome, right upper limb: Secondary | ICD-10-CM

## 2022-07-31 DIAGNOSIS — I5022 Chronic systolic (congestive) heart failure: Secondary | ICD-10-CM | POA: Diagnosis not present

## 2022-07-31 DIAGNOSIS — L309 Dermatitis, unspecified: Secondary | ICD-10-CM | POA: Insufficient documentation

## 2022-07-31 DIAGNOSIS — I4891 Unspecified atrial fibrillation: Secondary | ICD-10-CM | POA: Diagnosis not present

## 2022-07-31 LAB — POCT GLYCOSYLATED HEMOGLOBIN (HGB A1C): HbA1c, POC (prediabetic range): 5.7 % (ref 5.7–6.4)

## 2022-07-31 MED ORDER — ATORVASTATIN CALCIUM 20 MG PO TABS: 20.0000 mg | ORAL_TABLET | Freq: Every day | ORAL | 1 refills | Status: DC

## 2022-07-31 MED ORDER — MOMETASONE FUROATE 0.1 % EX OINT: 1.0000 "application " | TOPICAL_OINTMENT | Freq: Every day | CUTANEOUS | 1 refills | Status: AC | PRN

## 2022-07-31 NOTE — Progress Notes (Signed)
Subjective:  Patient ID: Tracy James, female    DOB: 10/14/1951  Age: 71 y.o. MRN: [id number]  CC: Hypertension   HPI Tracy James is a 71 y.o. year old female with a history of hypertension, atrial fibrillation CHF (EF 55 to 60% from echo of 02/2022), prediabetes.  Interval History: She had an A-fib clinic last month and she remains on dofetilide, metoprolol and Eliquis. She has no lightheadedness, chest pains, palpitations, pedal edema, PND.  She is requesting a refill of Mometasone which she previously received from a Dermatologist in [place] who had diagnosed her with Eczema. This started when she got involved with water aerobics.  She has numbness in her right hand and has to shake her wrist for relief. 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  Denies presence of reduced handgrip. Past Medical History:  Diagnosis Date   Aortic stenosis    Echo 06/2019: mod AS, mean 28 mmHg, peak velocity 3.6 m/s // Echo 9/21: EF 60-65 no RWMA, moderate LVH, normal RVSF, trivial MR, trivial AI, moderate aortic stenosis (mean gradient 36 mmHg; V-max 3.64 m/s; DI 0.18) [reviewed with Dr. Burt Knack - mod AS; rpt echo 1 yr]     CHF (congestive heart failure) ([place])    Chronic systolic and diastolic   CKD (chronic kidney disease)    CKD IIIb   Hypertension    Nonischemic cardiomyopathy    Tachy induced CM // TEE 12/2018: EF 30-35 // Echo 06/2019: EF 55-60 // Cor CTA 8/18: Ca score 0; no CAD   Persistent atrial fibrillation    Admx 12/19 w AF w RVR // R+L atrial thrombus on TEE - DCCV canceled; repeat TEE in 01/2019 w some residual clot // Dofetilide Rx started 07/2019 >> NSR restored without DCCV // CHADS-VASc 4 (6 if include LAA clot) >> Apixaban     Past Surgical History:  Procedure Laterality Date   COLONOSCOPY WITH PROPOFOL N/A 03/30/2022   Procedure: COLONOSCOPY WITH PROPOFOL;  Surgeon: Ladene Artist, MD;  Location: [facility] ENDOSCOPY;  Service: Gastroenterology;  Laterality: N/A;    POLYPECTOMY  03/30/2022   Procedure: POLYPECTOMY;  Surgeon: Ladene Artist, MD;  Location: [facility] ENDOSCOPY;  Service: Gastroenterology;;   TEE WITHOUT CARDIOVERSION N/A 12/06/2018   Procedure: TRANSESOPHAGEAL ECHOCARDIOGRAM (TEE);  Surgeon: Lelon Perla, MD;  Location: [facility] ENDOSCOPY;  Service: Cardiovascular;  Laterality: N/A;   TEE WITHOUT CARDIOVERSION N/A 01/20/2019   Procedure: TRANSESOPHAGEAL ECHOCARDIOGRAM (TEE);  Surgeon: [name], MD;  Location: [facility] ENDOSCOPY;  Service: Cardiovascular;  Laterality: N/A;   TONSILLECTOMY      Family History  Problem Relation Age of Onset   Arthritis Mother    Cancer Mother        colon cancer   Diabetes Mother    Hypertension Mother    Heart murmur Mother    Early death Brother    Heart murmur Brother    Lung cancer Brother    Arthritis Maternal Grandmother    Heart disease Maternal Grandmother        massive heart attack   Heart disease Paternal Grandmother    Asthma Brother    Diabetes Brother    Hypertension Brother    Heart murmur Brother    Heart disease Father        [email or web address]    Social History   Socioeconomic History   Marital status: Divorced    Spouse name: Not on file   Number of children: Not on  file   Years of education: Not on file   Highest education level: Not on file  Occupational History   Not on file  Tobacco Use   Smoking status: Former    Types: Cigarettes    Quit date: 12/05/2011    Years since quitting: 10.6   Smokeless tobacco: Never   Tobacco comments:     Former smoker 06/15/22  Vaping Use   Vaping Use: Never used  Substance and Sexual Activity   Alcohol use: Not Currently    Alcohol/week: 1.0 standard drink of alcohol    Types: 1 Glasses of wine per week    Comment: stop drinking occ 2 months 06/15/22   Drug use: No   Sexual activity: Not Currently    Birth control/protection: None  Other Topics Concern   Not on file  Social History Narrative   Not on file   Social  Determinants of Health   Financial Resource Strain: Not on file  Food Insecurity: Not on file  Transportation Needs: Not on file  Physical Activity: Not on file  Stress: Not on file  Social Connections: Not on file    No Known Allergies  Outpatient Medications Prior to Visit  Medication Sig Dispense Refill   acetaminophen (TYLENOL) 500 MG tablet Take 500 mg by mouth every 6 (six) hours as needed (for pain.).     apixaban (ELIQUIS) 5 MG TABS tablet Take 1 tablet (5 mg total) by mouth 2 (two) times daily. 180 tablet 1   calcium-vitamin D (OSCAL WITH D) 500-200 MG-UNIT TABS tablet Take 1 tablet by mouth daily.      dofetilide (TIKOSYN) 250 MCG capsule Take 1 capsule by mouth twice daily 180 capsule 2   furosemide (LASIX) 40 MG tablet Take 1 tablet (40 mg total) by mouth daily. 90 tablet 1   hydrALAZINE (APRESOLINE) 50 MG tablet TAKE 1 TABLET BY MOUTH THREE TIMES DAILY 270 tablet 1   loratadine (CLARITIN) 10 MG tablet Take 10 mg by mouth daily as needed for allergies.     metoprolol tartrate (LOPRESSOR) 25 MG tablet Take 1 tablet (25 mg total) by mouth 2 (two) times daily. 180 tablet 3   nitroGLYCERIN (NITROSTAT) 0.4 MG SL tablet Place 1 tablet (0.4 mg total) under the tongue every 5 (five) minutes as needed for chest pain. 20 tablet 0   Olopatadine HCl 0.2 % SOLN Apply 1 drop to eye daily. One drop to affected eye once daily 2.5 mL 2   Omega-3 1000 MG CAPS Take 1,000 mg by mouth daily.      VITAMIN D PO Take 1 tablet by mouth daily.     atorvastatin (LIPITOR) 20 MG tablet Take 1 tablet by mouth once daily 90 tablet 0   mometasone (ELOCON) 0.1 % ointment Apply 1 application. topically daily as needed (for skin rash).     No facility-administered medications prior to visit.     ROS Review of Systems  Constitutional:  Negative for activity change and appetite change.  HENT:  Negative for sinus pressure and sore throat.   Respiratory:  Negative for chest tightness, shortness of breath  and wheezing.   Cardiovascular:  Negative for chest pain and palpitations.  Gastrointestinal:  Negative for abdominal distention, abdominal pain and constipation.  Genitourinary: Negative.   Musculoskeletal: Negative.   Neurological:  Positive for numbness.  Psychiatric/Behavioral:  Negative for behavioral problems and dysphoric mood.     Objective:  BP 132/76   Pulse 62   Ht 5'  6" (1.676 m)   Wt 164 lb 12.8 oz (74.8 kg)   SpO2 100%   BMI 26.60 kg/m      07/31/2022    3:44 PM 06/15/2022    8:29 AM 04/10/2022   11:22 AM  BP/Weight  Systolic BP 740 814 99  Diastolic BP 76 62 65  Wt. (Lbs) 164.8 171.4 167  BMI 26.6 kg/m2 27.66 kg/m2 26.95 kg/m2      Physical Exam Constitutional:      Appearance: She is well-developed.  Cardiovascular:     Rate and Rhythm: Normal rate. Rhythm irregular.     Heart sounds: Normal heart sounds. No murmur heard. Pulmonary:     Effort: Pulmonary effort is normal.     Breath sounds: Normal breath sounds. No wheezing or rales.  Chest:     Chest wall: No tenderness.  Abdominal:     General: Bowel sounds are normal. There is no distension.     Palpations: Abdomen is soft. There is no mass.     Tenderness: There is no abdominal tenderness.  Musculoskeletal:     Right lower leg: No edema.     Left lower leg: No edema.     Comments: Negative Tinel's sign bilaterally Negative Phalen sign  Skin:    Comments: Dorsum of both hands with sparsely distributed patches which are scaly  Neurological:     Mental Status: She is alert and oriented to person, place, and time.     Comments: Normal handgrip bilaterally  Psychiatric:        Mood and Affect: Mood normal.        Latest Ref Rng & Units 06/15/2022    8:45 AM 04/25/2022   11:40 AM 04/10/2022   12:02 PM  CMP  Glucose 70 - 99 mg/dL 89  121  95   BUN 8 - 23 mg/dL '12  16  27   '$ Creatinine 0.44 - 1.00 mg/dL 1.03  1.29  1.87   Sodium 135 - 145 mmol/L 141  141  144   Potassium 3.5 - 5.1 mmol/L 4.0   4.1  4.6   Chloride 98 - 111 mmol/L 108  102  99   CO2 22 - 32 mmol/L '24  23  24   '$ Calcium 8.9 - 10.3 mg/dL 9.2  9.5  10.6   Total Protein 6.0 - 8.5 g/dL   7.3   Total Bilirubin 0.0 - 1.2 mg/dL   0.5   Alkaline Phos 44 - 121 IU/L   102   AST 0 - 40 IU/L   28   ALT 0 - 32 IU/L   26     Lipid Panel     Component Value Date/Time   CHOL 144 10/19/2021 1036   TRIG 126 10/19/2021 1036   HDL 40 10/19/2021 1036   CHOLHDL 4.1 11/07/2019 1058   CHOLHDL 3.2 09/26/2017 0910   VLDL 52 (H) 09/22/2016 0829   LDLCALC 81 10/19/2021 1036   LDLCALC 95 09/26/2017 0910    CBC    Component Value Date/Time   WBC 9.2 04/10/2022 1202   WBC 6.0 03/30/2022 0645   RBC 4.32 04/10/2022 1202   RBC 3.77 (L) 03/30/2022 0645   HGB 13.0 04/10/2022 1202   HCT 36.5 04/10/2022 1202   PLT 270 04/10/2022 1202   MCV 85 04/10/2022 1202   MCH 30.1 04/10/2022 1202   MCH 28.9 03/30/2022 0645   MCHC 35.6 04/10/2022 1202   MCHC 31.6 03/30/2022 0645   RDW 13.7  04/10/2022 1202   LYMPHSABS 2.7 01/15/2019 1049   MONOABS 714 07/24/2017 1613   EOSABS 0.1 01/15/2019 1049   BASOSABS 0.1 01/15/2019 1049    Lab Results  Component Value Date   HGBA1C 5.7 07/31/2022    Assessment & Plan:  1. Chronic systolic CHF (congestive heart failure) (HCC) Euvolemic with EF of 55 to 60% Continue current medications Follow-up with cardiology - atorvastatin (LIPITOR) 20 MG tablet; Take 1 tablet (20 mg total) by mouth daily.  Dispense: 90 tablet; Refill: 1  2. Other eczema Symptom onset with initiation of water aerobics Refilled steroid cream - mometasone (ELOCON) 0.1 % ointment; Apply 1 application  topically daily as needed (for skin rash).  Dispense: 45 g; Refill: 1  3. Atrial fibrillation, unspecified type ([place]) Currently on Eliquis, metoprolol, dofetilide Followed by A-fib clinic  4. Prediabetes Labs reveal prediabetes with an A1c of 5.7.  Working on a low carbohydrate diet, exercise, weight loss is recommended in  order to prevent progression to type 2 diabetes mellitus.  - POCT glycosylated hemoglobin (Hb A1C)  5. Carpal tunnel syndrome of right wrist Advised to use wrist brace Discussed the option of cortisone injection but she declines and would like to try wrist brace first    Meds ordered this encounter  Medications   mometasone (ELOCON) 0.1 % ointment    Sig: Apply 1 application  topically daily as needed (for skin rash).    Dispense:  45 g    Refill:  1   atorvastatin (LIPITOR) 20 MG tablet    Sig: Take 1 tablet (20 mg total) by mouth daily.    Dispense:  90 tablet    Refill:  1    Follow-up: Return in about 3 months (around 10/31/2022) for Medicare wellness exam.       Charlott Rakes, MD, FAAFP. [facility] [place], [place]   07/31/2022, 5:32 PM

## 2022-07-31 NOTE — Patient Instructions (Signed)
Eczema Eczema refers to a group of skin conditions that cause skin to become rough and inflamed. Each type of eczema has different triggers, symptoms, and treatments. Eczema of any type is usually itchy. Symptoms range from mild to severe. Eczema is not spread from person to person (is not contagious). It can appear on different parts of the body at different times. One person's eczema may look different from another person's eczema. What are the causes? The exact cause of this condition is not known. However, exposure to certain environmental factors, irritants, and allergens can make the condition worse. What are the signs or symptoms? Symptoms of this condition depend on the type of eczema you have. The types include: Contact dermatitis. There are two kinds: Irritant contact dermatitis. This happens when something irritates the skin and causes a rash. Allergic contact dermatitis. This happens when your skin comes in contact with something you are allergic to (allergens). This can include poison ivy, chemicals, or medicines that were applied to your skin. Atopic dermatitis. This is a long-term (chronic) skin disease that keeps coming back (recurring). It is the most common type of eczema. Usual symptoms are a red rash and itchy, dry, scaly skin. It usually starts showing signs in infancy and can last through adulthood. Dyshidrotic eczema. This is a form of eczema on the hands and feet. It shows up as very itchy, fluid-filled blisters. It can affect people of any age but is more common before age 40. Hand eczema. This causes very itchy areas of skin on the palms and sides of the hands and fingers. This type of eczema is common in industrial jobs where you may be exposed to different types of irritants. Lichen simplex chronicus. This type of eczema occurs when a person constantly scratches one area of the body. Repeated scratching of the area leads to thickened skin (lichenification). This condition can  accompany other types of eczema. It is more common in adults but may also be seen in children. Nummular eczema. This is a common type of eczema that most often affects the lower legs and the backs of the hands. It typically causes an itchy, red, circular, crusty lesion (plaque). Scratching may become a habit and can cause bleeding. Nummular eczema occurs most often in middle-aged or older people. Seborrheic dermatitis. This is a common skin disease that mainly affects the scalp. It may also affect other oily areas of the body, such as the face, sides of the nose, eyebrows, ears, eyelids, and chest. It is marked by small scaling and redness of the skin (erythema). This can affect people of all ages. In infants, this condition is called cradle cap. Stasis dermatitis. This is a common skin disease that can cause itching, scaling, and hyperpigmentation, usually on the legs and feet. It occurs most often in people who have a condition that prevents blood from being pumped through the veins in the legs (chronic venous insufficiency). Stasis dermatitis is a chronic condition that needs long-term management. How is this diagnosed? This condition may be diagnosed based on: A physical exam of your skin. Your medical history. Skin patch tests. These tests involve using patches that contain possible allergens and placing them on your back. Your health care provider will check in a few days to see if an allergic reaction occurred. How is this treated? Treatment for eczema is based on the type of eczema you have. You may be given hydrocortisone steroid medicine or antihistamines. These can relieve itching quickly and help reduce inflammation.   These may be prescribed or purchased over the counter, depending on the strength that is needed. Follow these instructions at home: Take or apply over-the-counter and prescription medicines only as told by your health care provider. Use creams or ointments to moisturize your  skin. Do not use lotions. Learn what triggers or irritates your symptoms so you can avoid these things. Treat symptom flare-ups quickly. Do not scratch your skin. This can make your rash worse. Keep all follow-up visits. This is important. Where to find more information American Academy of Dermatology: aad.org National Eczema Association: nationaleczema.org The Society for Pediatric Dermatology: pedsderm.net Contact a health care provider if: You have severe itching, even with treatment. You scratch your skin regularly until it bleeds. Your rash looks different than usual. Your skin is painful, swollen, or more red than usual. You have a fever. Summary Eczema refers to a group of skin conditions that cause skin to become rough and inflamed. Each type has different triggers. Eczema of any type causes itching that may range from mild to severe. Treatment varies based on the type of eczema you have. Hydrocortisone steroid medicine or antihistamines can help with itching and inflammation. Protecting your skin is the best way to prevent eczema. Use creams or ointments to moisturize your skin. Avoid triggers and irritants. Treat flare-ups quickly. This information is not intended to replace advice given to you by your health care provider. Make sure you discuss any questions you have with your health care provider. Document Revised: 08/30/2020 Document Reviewed: 08/30/2020 Elsevier Patient Education  2023 [employer]  

## 2022-08-03 ENCOUNTER — Other Ambulatory Visit: Payer: Self-pay | Admitting: Family Medicine

## 2022-08-16 NOTE — Progress Notes (Unsigned)
[facility] [place]                                     Cardiology Office Note:    Date:  08/18/2022   ID:  Tracy James, DOB [birth year], MRN [id number]  PCP:  Charlott Rakes, MD  [facility] Cardiologist:  Sherren Mocha, MD  [facility] Electrophysiologist:  None   Referring MD: Charlott Rakes, MD   Follow up aortic stenosis  History of Present Illness:    Tracy James is a 71 y.o. female with a hx of persistent atrial fibrillation maintaining sinus on dofetilide (on Eliquis), HFrEF with return to normal EF, CKD stage IIIa, HTN, and moderate to severe AS who presents to clinic for follow up.  Dr. Burt Knack evaluated her in 09/2021 and there was some discrepancy between the morphologic appearance of the aortic valve on 2D echo and the Doppler data.  The valve appeared to open reasonably well with only mild stenosis.  With Doppler data suggested severe aortic stenosis.  The patient ultimately underwent a gated cardiac CTA which showed a bicuspid aortic valve with raphae between the right and left coronary cusps with severely reduced cusp separation.  However, the aortic valve calcium score was only 408.  She was incidentally noted to have an ovarian mass.  She was referred for gynecologic evaluation and was felt to have reassuring findings of negative tumor markers.   She was last seen by Dr. Burt Knack in 02/2022 and felt to have moderately severe, stage C aortic stenosis. Plan was for 6 month follow up and 1 year echo/office visit.   Patient was hospitalized 03/2022 with lower GI bleed, was not transfused. GI performed a colonoscopy and bleeding felt to be diverticular.  Today the patient presents to clinic for follow up. She is here alone. She is feeling wonderful. She does exercise everyday including an hour of water aerobics.  No CP. No SOB except after intense physical exertion. No LE edema, orthopnea or PND. No dizziness or syncope.  No blood in stool or urine. No palpitations.     Past Medical History:  Diagnosis Date   Aortic stenosis    Echo 06/2019: mod AS, mean 28 mmHg, peak velocity 3.6 m/s // Echo 9/21: EF 60-65 no RWMA, moderate LVH, normal RVSF, trivial MR, trivial AI, moderate aortic stenosis (mean gradient 36 mmHg; V-max 3.64 m/s; DI 0.18) [reviewed with Dr. Burt Knack - mod AS; rpt echo 1 yr]     CHF (congestive heart failure) ([place])    Chronic systolic and diastolic   CKD (chronic kidney disease)    CKD IIIb   Hypertension    Nonischemic cardiomyopathy    Tachy induced CM // TEE 12/2018: EF 30-35 // Echo 06/2019: EF 55-60 // Cor CTA 8/18: Ca score 0; no CAD   Persistent atrial fibrillation    Admx 12/19 w AF w RVR // R+L atrial thrombus on TEE - DCCV canceled; repeat TEE in 01/2019 w some residual clot // Dofetilide Rx started 07/2019 >> NSR restored without DCCV // CHADS-VASc 4 (6 if include LAA clot) >> Apixaban     Past Surgical History:  Procedure Laterality Date   COLONOSCOPY WITH PROPOFOL N/A 03/30/2022   Procedure: COLONOSCOPY WITH PROPOFOL;  Surgeon: Ladene Artist, MD;  Location: [facility] ENDOSCOPY;  Service: Gastroenterology;  Laterality: N/A;   POLYPECTOMY  03/30/2022  Procedure: POLYPECTOMY;  Surgeon: Ladene Artist, MD;  Location: [place];  Service: Gastroenterology;;   TEE WITHOUT CARDIOVERSION N/A 12/06/2018   Procedure: TRANSESOPHAGEAL ECHOCARDIOGRAM (TEE);  Surgeon: Lelon Perla, MD;  Location: [facility] ENDOSCOPY;  Service: Cardiovascular;  Laterality: N/A;   TEE WITHOUT CARDIOVERSION N/A 01/20/2019   Procedure: TRANSESOPHAGEAL ECHOCARDIOGRAM (TEE);  Surgeon: [name], MD;  Location: [facility] ENDOSCOPY;  Service: Cardiovascular;  Laterality: N/A;   TONSILLECTOMY      Current Medications: Current Meds  Medication Sig   acetaminophen (TYLENOL) 500 MG tablet Take 500 mg by mouth every 6 (six) hours as needed (for pain.).   apixaban (ELIQUIS) 5 MG TABS tablet Take 1 tablet (5 mg total) by  mouth 2 (two) times daily.   atorvastatin (LIPITOR) 20 MG tablet Take 1 tablet (20 mg total) by mouth daily.   calcium-vitamin D (OSCAL WITH D) 500-200 MG-UNIT TABS tablet Take 1 tablet by mouth daily.    dofetilide (TIKOSYN) 250 MCG capsule Take 1 capsule by mouth twice daily   furosemide (LASIX) 40 MG tablet Take 1 tablet (40 mg total) by mouth daily.   hydrALAZINE (APRESOLINE) 50 MG tablet TAKE 1 TABLET BY MOUTH THREE TIMES DAILY   loratadine (CLARITIN) 10 MG tablet Take 10 mg by mouth daily as needed for allergies.   metoprolol tartrate (LOPRESSOR) 25 MG tablet Take 1 tablet (25 mg total) by mouth 2 (two) times daily.   mometasone (ELOCON) 0.1 % ointment Apply 1 application  topically daily as needed (for skin rash).   nitroGLYCERIN (NITROSTAT) 0.4 MG SL tablet Place 1 tablet (0.4 mg total) under the tongue every 5 (five) minutes as needed for chest pain.   Olopatadine HCl 0.2 % SOLN Apply 1 drop to eye daily. One drop to affected eye once daily   Omega-3 1000 MG CAPS Take 1,000 mg by mouth daily.    VITAMIN D PO Take 1 tablet by mouth daily.     Allergies:   Patient has no known allergies.   Social History   Socioeconomic History   Marital status: Divorced    Spouse name: Not on file   Number of children: Not on file   Years of education: Not on file   Highest education level: Not on file  Occupational History   Not on file  Tobacco Use   Smoking status: Former    Types: Cigarettes    Quit date: 12/05/2011    Years since quitting: 10.7   Smokeless tobacco: Never   Tobacco comments:     Former smoker 06/15/22  Vaping Use   Vaping Use: Never used  Substance and Sexual Activity   Alcohol use: Not Currently    Alcohol/week: 1.0 standard drink of alcohol    Types: 1 Glasses of wine per week    Comment: stop drinking occ 2 months 06/15/22   Drug use: No   Sexual activity: Not Currently    Birth control/protection: None  Other Topics Concern   Not on file  Social History  Narrative   Not on file   Social Determinants of Health   Financial Resource Strain: Not on file  Food Insecurity: Not on file  Transportation Needs: Not on file  Physical Activity: Not on file  Stress: Not on file  Social Connections: Not on file     Family History: The patient's family history includes Arthritis in her maternal grandmother and mother; Asthma in her brother; Cancer in her mother; Diabetes in her brother and mother;  Early death in her brother; Heart disease in her father, maternal grandmother, and paternal grandmother; Heart murmur in her brother, brother, and mother; Hypertension in her brother and mother; Lung cancer in her brother.  ROS:   Please see the history of present illness.    All other systems reviewed and are negative.  EKGs/Labs/Other Studies Reviewed:    The following studies were reviewed today:  Echo 02/21/22 IMPRESSIONS  1. Left ventricular ejection fraction, by estimation, is 55 to 60%. The  left ventricle has normal function. The left ventricle has no regional  wall motion abnormalities. There is mild left ventricular hypertrophy.  Left ventricular diastolic parameters  are consistent with Grade II diastolic dysfunction (pseudonormalization).   2. Right ventricular systolic function is mildly reduced. The right  ventricular size is normal. Tricuspid regurgitation signal is inadequate  for assessing PA pressure.   3. Left atrial size was moderately dilated.   4. The mitral valve is normal in structure. No evidence of mitral valve  regurgitation. No evidence of mitral stenosis.   5. The aortic valve is tricuspid. There is moderate calcification of the  aortic valve. Aortic valve regurgitation is trivial. Severe aortic valve  stenosis (paradoxical low flow/low gradient severe AS). Aortic valve area,  by VTI measures 0.52 cm. Aortic   valve mean gradient measures 34.0 mmHg.   6. The inferior vena cava is normal in size with greater than 50%   respiratory variability, suggesting right atrial pressure of 3 mmHg.   EKG:  EKG is NOT ordered today.   Recent Labs: 04/10/2022: ALT 26; Hemoglobin 13.0; Platelets 270 06/15/2022: BUN 12; Creatinine, Ser 1.03; Magnesium 2.1; Potassium 4.0; Sodium 141  Recent Lipid Panel    Component Value Date/Time   CHOL 144 10/19/2021 1036   TRIG 126 10/19/2021 1036   HDL 40 10/19/2021 1036   CHOLHDL 4.1 11/07/2019 1058   CHOLHDL 3.2 09/26/2017 0910   VLDL 52 (H) 09/22/2016 0829   LDLCALC 81 10/19/2021 1036   LDLCALC 95 09/26/2017 0910     Risk Assessment/Calculations:    CHA2DS2-VASc Score = 4   This indicates a 4.8% annual risk of stroke. The patient's score is based upon: CHF History: 1 HTN History: 1 Diabetes History: 0 Stroke History: 0 Vascular Disease History: 0 Age Score: 1 Gender Score: 1      Physical Exam:    VS:  BP (!) 120/58 (BP Location: Left Arm, Patient Position: Sitting, Cuff Size: Normal)   Pulse 67   Ht 5' 6.5" (1.689 m)   Wt 175 lb 6.4 oz (79.6 kg)   SpO2 100%   BMI 27.89 kg/m     Wt Readings from Last 3 Encounters:  08/18/22 175 lb 6.4 oz (79.6 kg)  07/31/22 164 lb 12.8 oz (74.8 kg)  06/15/22 171 lb 6.4 oz (77.7 kg)     GEN:  Well nourished, well developed in no acute distress HEENT: Normal NECK: No JVD LYMPHATICS: No lymphadenopathy CARDIAC: RRR, very soft systolic murmurs. No rubs, gallops RESPIRATORY:  Clear to auscultation without rales, wheezing or rhonchi  ABDOMEN: Soft, non-tender, non-distended MUSCULOSKELETAL:  No edema; No deformity  SKIN: Warm and dry NEUROLOGIC:  Alert and oriented x 3 PSYCHIATRIC:  Normal affect   ASSESSMENT:    1. Aortic valve stenosis, etiology of cardiac valve disease unspecified   2. Persistent atrial fibrillation ([place])   3. Essential hypertension   4. Ovarian mass    PLAN:    In order of problems listed above:  Severe stage C aortic stenosis: she is asymptomatic at this time and exercises everyday.  Murmur is not impressive on exam. Continue surveillance with follow up echo in 02/2023. She will call us if anything changes before then  Persistent atrial fibrillation: maintaining sinus on dofetilide. Continue Eliquis. She is tolerating this well with no further issues with bleeding. She is not interested in [place] at this time.   HTN: BP well controlled today. No changes made.   Ovarian mass:  noted on pre TAVR CTs. Followed by gyne and has remained stable.      Medication Adjustments/Labs and Tests Ordered: Current medicines are reviewed at length with the patient today.  Concerns regarding medicines are outlined above.  No orders of the defined types were placed in this encounter.  No orders of the defined types were placed in this encounter.   Patient Instructions  Medication Instructions:  Your physician recommends that you continue on your current medications as directed. Please refer to the Current Medication list given to you today.   *If you need a refill on your cardiac medications before your next appointment, please call your pharmacy*   Lab Work: None ordered   If you have labs (blood work) drawn today and your tests are completely normal, you will receive your results only by: [place] (if you have MyChart) OR A paper copy in the mail If you have any lab test that is abnormal or we need to change your treatment, we will call you to review the results.   Testing/Procedures: None ordered    Follow-Up: Follow up as scheduled   Other Instructions   Important Information About Sugar         Signed, Angelena Form, PA-C  08/18/2022 10:28 AM    [place]

## 2022-08-18 ENCOUNTER — Ambulatory Visit: Payer: Medicare PPO | Attending: Cardiovascular Disease | Admitting: Physician Assistant

## 2022-08-18 VITALS — BP 120/58 | HR 67 | Ht 66.5 in | Wt 175.4 lb

## 2022-08-18 DIAGNOSIS — I4819 Other persistent atrial fibrillation: Secondary | ICD-10-CM | POA: Diagnosis not present

## 2022-08-18 DIAGNOSIS — I350 Nonrheumatic aortic (valve) stenosis: Secondary | ICD-10-CM | POA: Diagnosis not present

## 2022-08-18 DIAGNOSIS — I10 Essential (primary) hypertension: Secondary | ICD-10-CM | POA: Diagnosis not present

## 2022-08-18 DIAGNOSIS — N838 Other noninflammatory disorders of ovary, fallopian tube and broad ligament: Secondary | ICD-10-CM | POA: Diagnosis not present

## 2022-08-18 NOTE — Patient Instructions (Signed)

## 2022-10-06 ENCOUNTER — Other Ambulatory Visit: Payer: Self-pay | Admitting: Nurse Practitioner

## 2022-10-06 DIAGNOSIS — N1831 Chronic kidney disease, stage 3a: Secondary | ICD-10-CM

## 2022-10-06 DIAGNOSIS — I10 Essential (primary) hypertension: Secondary | ICD-10-CM

## 2022-10-06 DIAGNOSIS — I5022 Chronic systolic (congestive) heart failure: Secondary | ICD-10-CM

## 2022-10-06 NOTE — Telephone Encounter (Signed)
Requested Prescriptions  Pending Prescriptions Disp Refills   furosemide (LASIX) 40 MG tablet [Pharmacy Med Name: Furosemide 40 MG Oral Tablet] 90 tablet 0    Sig: Take 1 tablet by mouth once daily     Cardiovascular:  Diuretics - Loop Failed - 10/06/2022 10:55 AM      Failed - Cr in normal range and within 180 days    Creat  Date Value Ref Range Status  09/26/2017 1.03 (H) 0.50 - 0.99 mg/dL Final    Comment:    For patients >69 years of age, the reference limit for Creatinine is approximately 13% higher for people identified as African-American. .    Creatinine, Ser  Date Value Ref Range Status  06/15/2022 1.03 (H) 0.44 - 1.00 mg/dL Final         Passed - K in normal range and within 180 days    Potassium  Date Value Ref Range Status  06/15/2022 4.0 3.5 - 5.1 mmol/L Final         Passed - Ca in normal range and within 180 days    Calcium  Date Value Ref Range Status  06/15/2022 9.2 8.9 - 10.3 mg/dL Final         Passed - Na in normal range and within 180 days    Sodium  Date Value Ref Range Status  06/15/2022 141 135 - 145 mmol/L Final  04/25/2022 141 134 - 144 mmol/L Final         Passed - Cl in normal range and within 180 days    Chloride  Date Value Ref Range Status  06/15/2022 108 98 - 111 mmol/L Final         Passed - Mg Level in normal range and within 180 days    Magnesium  Date Value Ref Range Status  06/15/2022 2.1 1.7 - 2.4 mg/dL Final    Comment:    Performed at [facility], [place] [address] [place], [place] [address]         Passed - Last BP in normal range    BP Readings from Last 1 Encounters:  08/18/22 (!) 120/58         Passed - Valid encounter within last 6 months    Recent Outpatient Visits           2 months ago Atrial fibrillation, unspecified type [facility])   [place] Charlott Rakes, MD   5 months ago Hospital discharge follow-up   [place] Gildardo Pounds, NP    11 months ago Encounter for [employer] annual wellness exam   [place], [place], MD   1 year ago Allergic conjunctivitis of both eyes   [place] Ann, MD   1 year ago Encounter for [employer] annual wellness exam   [place], MD       Future Appointments             In 3 weeks Charlott Rakes, MD [place]   In 4 months Sherren Mocha, MD [facility]. [facility], [facility]

## 2022-10-31 ENCOUNTER — Other Ambulatory Visit: Payer: Self-pay

## 2022-10-31 ENCOUNTER — Ambulatory Visit: Payer: Medicare PPO | Attending: Family Medicine | Admitting: Family Medicine

## 2022-10-31 ENCOUNTER — Encounter: Payer: Self-pay | Admitting: Family Medicine

## 2022-10-31 VITALS — BP 121/70 | HR 66 | Temp 97.9°F | Ht 66.0 in | Wt 176.2 lb

## 2022-10-31 DIAGNOSIS — Z0000 Encounter for general adult medical examination without abnormal findings: Secondary | ICD-10-CM

## 2022-10-31 MED ORDER — COVID-19 MRNA 2023-2024 VACCINE (COMIRNATY) 0.3 ML INJECTION
0.3000 mL | Freq: Once | INTRAMUSCULAR | 0 refills | Status: AC
  Filled 2022-10-31: qty 0.3, 1d supply, fill #0

## 2022-10-31 NOTE — Patient Instructions (Signed)
  Ms. Kuch , Thank you for taking time to come for your Medicare Wellness Visit. I appreciate your ongoing commitment to your health goals. Please review the following plan we discussed and let me know if I can assist you in the future.   These are the goals we discussed:  Goals      Exercise 150 min/wk Moderate Activity        This is a list of the screening recommended for you and due dates:  Health Maintenance  Topic Date Due   COVID-19 Vaccine (4 - 2023-24 season) 08/04/2022   Zoster (Shingles) Vaccine (1 of 2) 10/31/2022*   Flu Shot  12/04/2048*   Medicare Annual Wellness Visit  11/01/2023   Mammogram  01/18/2024   Cologuard (Stool DNA test)  10/24/2024   Pneumonia Vaccine  Completed   DEXA scan (bone density measurement)  Completed   Hepatitis C Screening: USPSTF Recommendation to screen - Ages 44-79 yo.  Completed   HPV Vaccine  Aged Out   Colon Cancer Screening  Discontinued  *Topic was postponed. The date shown is not the original due date.

## 2022-10-31 NOTE — Progress Notes (Signed)
Subjective:   Tracy James is a 71 y.o. female who presents for Medicare Annual (Subsequent) preventive examination. She reports doing well and exercises regularly with adequate intake of fruits and vegetables.  Denies additional concerns today.  Review of Systems    General: negative for fever, weight loss, appetite change Eyes: no visual symptoms. ENT: no ear symptoms, no sinus tenderness, no nasal congestion or sore throat. Neck: no pain  Respiratory: no wheezing, shortness of breath, cough Cardiovascular: no chest pain, no dyspnea on exertion, no pedal edema, no orthopnea. Gastrointestinal: no abdominal pain, no diarrhea, no constipation Genito-Urinary: no urinary frequency, no dysuria, no polyuria. Hematologic: no bruising Endocrine: no cold or heat intolerance Neurological: no headaches, no seizures, no tremors Musculoskeletal: no joint pains, no joint swelling Skin: no pruritus, no rash. Psychological: no depression, no anxiety,   Cardiac Risk Factors include: none     Objective:    Today's Vitals   10/31/22 0957  BP: 121/70  Pulse: 66  Temp: 97.9 F (36.6 C)  TempSrc: Oral  SpO2: 99%  Weight: 176 lb 3.2 oz (79.9 kg)  Height: '5\' 6"'$  (1.676 m)   Body mass index is 28.44 kg/m.     10/31/2022    9:55 AM 03/27/2022    1:59 PM 10/17/2021   10:02 AM 11/23/2020    9:30 AM 07/17/2019    2:28 PM 12/06/2018   11:41 AM 12/03/2018   12:50 PM  Advanced Directives  Does Patient Have a Medical Advance Directive? No No No No No No No  Would patient like information on creating a medical advance directive? Yes (ED - Information included in AVS) No - Patient declined  Yes (Inpatient - patient defers creating a medical advance directive at this time - Information given) No - Patient declined  No - Patient declined    Current Medications (verified) Outpatient Encounter Medications as of 10/31/2022  Medication Sig   acetaminophen (TYLENOL) 500 MG tablet Take 500 mg by  mouth every 6 (six) hours as needed (for pain.).   apixaban (ELIQUIS) 5 MG TABS tablet Take 1 tablet (5 mg total) by mouth 2 (two) times daily.   atorvastatin (LIPITOR) 20 MG tablet Take 1 tablet (20 mg total) by mouth daily.   calcium-vitamin D (OSCAL WITH D) 500-200 MG-UNIT TABS tablet Take 1 tablet by mouth daily.    dofetilide (TIKOSYN) 250 MCG capsule Take 1 capsule by mouth twice daily   furosemide (LASIX) 40 MG tablet Take 1 tablet by mouth once daily   hydrALAZINE (APRESOLINE) 50 MG tablet TAKE 1 TABLET BY MOUTH THREE TIMES DAILY   loratadine (CLARITIN) 10 MG tablet Take 10 mg by mouth daily as needed for allergies.   metoprolol tartrate (LOPRESSOR) 25 MG tablet Take 1 tablet (25 mg total) by mouth 2 (two) times daily.   mometasone (ELOCON) 0.1 % ointment Apply 1 application  topically daily as needed (for skin rash).   nitroGLYCERIN (NITROSTAT) 0.4 MG SL tablet Place 1 tablet (0.4 mg total) under the tongue every 5 (five) minutes as needed for chest pain.   Olopatadine HCl 0.2 % SOLN Apply 1 drop to eye daily. One drop to affected eye once daily   Omega-3 1000 MG CAPS Take 1,000 mg by mouth daily.    VITAMIN D PO Take 1 tablet by mouth daily.   No facility-administered encounter medications on file as of 10/31/2022.    Allergies (verified) Patient has no known allergies.   History: Past Medical History:  Diagnosis Date   Aortic stenosis    Echo 06/2019: mod AS, mean 28 mmHg, peak velocity 3.6 m/s // Echo 9/21: EF 60-65 no RWMA, moderate LVH, normal RVSF, trivial MR, trivial AI, moderate aortic stenosis (mean gradient 36 mmHg; V-max 3.64 m/s; DI 0.18) [reviewed with Dr. Burt Knack - mod AS; rpt echo 1 yr]     CHF (congestive heart failure) ([place])    Chronic systolic and diastolic   CKD (chronic kidney disease)    CKD IIIb   Hypertension    Nonischemic cardiomyopathy    Tachy induced CM // TEE 12/2018: EF 30-35 // Echo 06/2019: EF 55-60 // Cor CTA 8/18: Ca score 0; no CAD   Persistent  atrial fibrillation    Admx 12/19 w AF w RVR // R+L atrial thrombus on TEE - DCCV canceled; repeat TEE in 01/2019 w some residual clot // Dofetilide Rx started 07/2019 >> NSR restored without DCCV // CHADS-VASc 4 (6 if include LAA clot) >> Apixaban    Past Surgical History:  Procedure Laterality Date   COLONOSCOPY WITH PROPOFOL N/A 03/30/2022   Procedure: COLONOSCOPY WITH PROPOFOL;  Surgeon: Ladene Artist, MD;  Location: [facility] ENDOSCOPY;  Service: Gastroenterology;  Laterality: N/A;   POLYPECTOMY  03/30/2022   Procedure: POLYPECTOMY;  Surgeon: Ladene Artist, MD;  Location: [facility] ENDOSCOPY;  Service: Gastroenterology;;   TEE WITHOUT CARDIOVERSION N/A 12/06/2018   Procedure: TRANSESOPHAGEAL ECHOCARDIOGRAM (TEE);  Surgeon: Lelon Perla, MD;  Location: [facility] ENDOSCOPY;  Service: Cardiovascular;  Laterality: N/A;   TEE WITHOUT CARDIOVERSION N/A 01/20/2019   Procedure: TRANSESOPHAGEAL ECHOCARDIOGRAM (TEE);  Surgeon: [name], MD;  Location: [facility] ENDOSCOPY;  Service: Cardiovascular;  Laterality: N/A;   TONSILLECTOMY     Family History  Problem Relation Age of Onset   Arthritis Mother    Cancer Mother        colon cancer   Diabetes Mother    Hypertension Mother    Heart murmur Mother    Early death Brother    Heart murmur Brother    Lung cancer Brother    Arthritis Maternal Grandmother    Heart disease Maternal Grandmother        massive heart attack   Heart disease Paternal Grandmother    Asthma Brother    Diabetes Brother    Hypertension Brother    Heart murmur Brother    Heart disease Father        [email or web address]   Social History   Socioeconomic History   Marital status: Divorced    Spouse name: Not on file   Number of children: Not on file   Years of education: Not on file   Highest education level: Not on file  Occupational History   Not on file  Tobacco Use   Smoking status: Former    Types: Cigarettes    Quit date: 12/05/2011    Years since quitting: 10.9    Smokeless tobacco: Never   Tobacco comments:     Former smoker 06/15/22  Vaping Use   Vaping Use: Never used  Substance and Sexual Activity   Alcohol use: Not Currently    Alcohol/week: 1.0 standard drink of alcohol    Types: 1 Glasses of wine per week    Comment: stop drinking occ 2 months 06/15/22   Drug use: No   Sexual activity: Not Currently    Birth control/protection: None  Other Topics Concern   Not on file  Social History Narrative   Not on file  Social Determinants of Health   Financial Resource Strain: Low Risk  (10/31/2022)   Overall Financial Resource Strain (CARDIA)    Difficulty of Paying Living Expenses: Not very hard  Food Insecurity: No Food Insecurity (10/31/2022)   Hunger Vital Sign    Worried About Running Out of Food in the Last Year: Never true    Ran Out of Food in the Last Year: Never true  Transportation Needs: No Transportation Needs (10/31/2022)   PRAPARE - Hydrologist (Medical): No    Lack of Transportation (Non-Medical): No  Physical Activity: Insufficiently Active (10/31/2022)   Exercise Vital Sign    Days of Exercise per Week: 4 days    Minutes of Exercise per Session: 30 min  Stress: No Stress Concern Present (10/31/2022)   [place]    Feeling of Stress : Not at all  Social Connections: Moderately Integrated (10/31/2022)   Social Connection and Isolation Panel [NHANES]    Frequency of Communication with Friends and Family: More than three times a week    Frequency of Social Gatherings with Friends and Family: Once a week    Attends Religious Services: More than 4 times per year    Active Member of [employer] or Organizations: Yes    Attends Archivist Meetings: 1 to 4 times per year    Marital Status: Divorced    Tobacco Counseling Counseling given: Not Answered Tobacco comments:  Former smoker 06/15/22   Clinical  Intake:  Pre-visit preparation completed: No  Pain : No/denies pain     Diabetes: No     Diabetic?No  Interpreter Needed?: No      Activities of Daily Living    10/31/2022    9:56 AM 03/28/2022    9:25 PM  In your present state of health, do you have any difficulty performing the following activities:  Hearing? 0 0  Vision? 0 0  Difficulty concentrating or making decisions? 0 0  Walking or climbing stairs? 0 0  Dressing or bathing? 0 0  Doing errands, shopping? 0 0  Preparing Food and eating ? N   Using the Toilet? N   In the past six months, have you accidently leaked urine? Y   Do you have problems with loss of bowel control? N   Managing your Medications? N   Managing your Finances? N   Housekeeping or managing your Housekeeping? N     Patient Care Team: Charlott Rakes, MD as PCP - General (Family Medicine) Sherren Mocha, MD as PCP - Cardiology (Cardiology)  Indicate any recent Medical Services you may have received from other than [facility] providers in the past year (date may be approximate).     Assessment:   This is a routine wellness examination for Alanna.  Hearing/Vision screen No results found.  Dietary issues and exercise activities discussed: Current Exercise Habits: Home exercise routine, Type of exercise: walking, Time (Minutes): 30, Frequency (Times/Week): 4, Weekly Exercise (Minutes/Week): 120, Intensity: Mild, Exercise limited by: None identified   Goals Addressed   None    Depression Screen    10/31/2022    9:56 AM 10/31/2022    9:55 AM 07/31/2022    3:46 PM 04/10/2022   11:30 AM 10/17/2021   10:06 AM 07/20/2021    2:24 PM 03/24/2021    9:24 AM  PHQ 2/9 Scores  PHQ - 2 Score 0 0 0 1 0 0 0  PHQ- 9 Score  $'1 5   1    'j$ Fall Risk    10/31/2022    9:55 AM 07/31/2022    3:45 PM 04/10/2022   11:29 AM 10/17/2021   10:02 AM 03/24/2021    9:24 AM  Fall Risk   Falls in the past year? 0 0 0 0 0  Number falls in past yr: 0 0 0 0 0  Injury  with Fall? 0 0 0 0 0  Risk for fall due to : No Fall Risks No Fall Risks No Fall Risks    Follow up   Falls evaluation completed      FALL RISK PREVENTION PERTAINING TO THE HOME:  Any stairs in or around the home? No  If so, are there any without handrails? No  Home free of loose throw rugs in walkways, pet beds, electrical cords, etc? Yes  Adequate lighting in your home to reduce risk of falls? Yes   ASSISTIVE DEVICES UTILIZED TO PREVENT FALLS:  Life alert? No  Use of a cane, walker or w/c? No  Grab bars in the bathroom? Yes  Shower chair or bench in shower? No  Elevated toilet seat or a handicapped toilet? No   TIMED UP AND GO:  Was the test performed? Yes .  Length of time to ambulate 10 feet: 6 sec.   Gait steady and fast without use of assistive device  Cognitive Function:    11/23/2020    9:32 AM 11/23/2020    9:03 AM  MMSE - Mini Mental State Exam  Orientation to time 5 5  Orientation to Place 5 5  Registration 3   Attention/ Calculation 5 5  Recall 2   Language- name 2 objects 2   Language- repeat 1   Language- follow 3 step command 3   Language- read & follow direction 1   Write a sentence 1   Copy design 1   Total score 29         10/31/2022    9:57 AM  6CIT Screen  What Year? 0 points  What month? 0 points  What time? 0 points  Count back from 20 0 points  Months in reverse 0 points  Repeat phrase 0 points  Total Score 0 points    Immunizations Immunization History  Administered Date(s) Administered   PFIZER(Purple Top)SARS-COV-2 Vaccination 02/11/2020, 03/10/2020, 09/23/2020   Pneumococcal Conjugate-13 12/04/2018   Pneumococcal Polysaccharide-23 01/15/2019     Qualifies for Shingles Vaccine? Yes   Zostavax completed No   Shingrix Completed?: No.    Education has been provided regarding the importance of this vaccine. Patient has been advised to call insurance company to determine out of pocket expense if they have not yet received  this vaccine. Advised may also receive vaccine at local pharmacy or Health Dept. Verbalized acceptance and understanding.  Screening Tests Health Maintenance  Topic Date Due   COVID-19 Vaccine (4 - 2023-24 season) 08/04/2022   Medicare Annual Wellness (AWV)  10/17/2022   Zoster Vaccines- Shingrix (1 of 2) 10/31/2022 (Originally 07/14/1970)   INFLUENZA VACCINE  12/04/2048 (Originally 07/04/2022)   MAMMOGRAM  01/18/2024   Fecal DNA (Cologuard)  10/24/2024   Pneumonia Vaccine 90+ Years old  Completed   DEXA SCAN  Completed   Hepatitis C Screening  Completed   HPV VACCINES  Aged Out   COLONOSCOPY (Pts 45-14yr Insurance coverage will need to be confirmed)  Discontinued    Health Maintenance  Health Maintenance Due  Topic Date Due  COVID-19 Vaccine (4 - 2023-24 season) 08/04/2022   Medicare Annual Wellness (AWV)  10/17/2022    Colorectal cancer screening: Type of screening: Cologuard. Completed 2022. Repeat every 3 years  Mammogram status: Completed 01/2022. Repeat every year  Bone Density status: Completed 11/2016. Results reflect: Bone density results: NORMAL. Repeat every   years.  Lung Cancer Screening: (Low Dose CT Chest recommended if Age 21-80 years, 30 pack-year currently smoking OR have quit w/in 15years.) does not qualify.   Lung Cancer Screening Referral: not applicable  Additional Screening:  Hepatitis C Screening: does qualify; Completed yes  Vision Screening: Recommended annual ophthalmology exams for early detection of glaucoma and other disorders of the eye. Is the patient up to date with their annual eye exam?  Yes  Who is the provider or what is the name of the office in which the patient attends annual eye exams? Unknown If pt is not established with a provider, would they like to be referred to a provider to establish care? No .   Dental Screening: Recommended annual dental exams for proper oral hygiene  Community Resource Referral / Chronic Care  Management: CRR required this visit?  No   CCM required this visit?  No      Plan:   1. Encounter for Medicare annual wellness exam Counseled on 150 minutes of exercise per week, healthy eating (including decreased daily intake of saturated fats, cholesterol, added sugars, sodium),routine healthcare maintenance.    I have personally reviewed and noted the following in the patient's chart:   Medical and social history Use of alcohol, tobacco or illicit drugs  Current medications and supplements including opioid prescriptions. Patient is not currently taking opioid prescriptions. Functional ability and status Nutritional status Physical activity Advanced directives List of other physicians Hospitalizations, surgeries, and ER visits in previous 12 months Vitals Screenings to include cognitive, depression, and falls Referrals and appointments  In addition, I have reviewed and discussed with patient certain preventive protocols, quality metrics, and best practice recommendations. A written personalized care plan for preventive services as well as general preventive health recommendations were provided to patient.     Charlott Rakes, MD   10/31/2022

## 2022-11-02 ENCOUNTER — Other Ambulatory Visit (HOSPITAL_BASED_OUTPATIENT_CLINIC_OR_DEPARTMENT_OTHER): Payer: Self-pay

## 2022-11-10 ENCOUNTER — Other Ambulatory Visit: Payer: Self-pay | Admitting: Physician Assistant

## 2022-12-07 ENCOUNTER — Ambulatory Visit (HOSPITAL_COMMUNITY)
Admission: RE | Admit: 2022-12-07 | Discharge: 2022-12-07 | Disposition: A | Discharge status: Home / Self Care | Payer: Medicare PPO | Source: Ambulatory Visit | Attending: Physician Assistant | Admitting: Physician Assistant

## 2022-12-07 ENCOUNTER — Encounter (HOSPITAL_COMMUNITY): Payer: Self-pay | Admitting: Physician Assistant

## 2022-12-07 VITALS — BP 120/66 | HR 71 | Ht 66.0 in | Wt 179.0 lb

## 2022-12-07 DIAGNOSIS — I350 Nonrheumatic aortic (valve) stenosis: Secondary | ICD-10-CM | POA: Diagnosis not present

## 2022-12-07 DIAGNOSIS — I428 Other cardiomyopathies: Secondary | ICD-10-CM | POA: Diagnosis not present

## 2022-12-07 DIAGNOSIS — D6869 Other thrombophilia: Secondary | ICD-10-CM | POA: Insufficient documentation

## 2022-12-07 DIAGNOSIS — I10 Essential (primary) hypertension: Secondary | ICD-10-CM | POA: Insufficient documentation

## 2022-12-07 DIAGNOSIS — I4819 Other persistent atrial fibrillation: Secondary | ICD-10-CM | POA: Diagnosis not present

## 2022-12-07 DIAGNOSIS — F17200 Nicotine dependence, unspecified, uncomplicated: Secondary | ICD-10-CM | POA: Diagnosis not present

## 2022-12-07 DIAGNOSIS — Z7901 Long term (current) use of anticoagulants: Secondary | ICD-10-CM | POA: Diagnosis not present

## 2022-12-07 DIAGNOSIS — Z79899 Other long term (current) drug therapy: Secondary | ICD-10-CM | POA: Insufficient documentation

## 2022-12-07 LAB — BASIC METABOLIC PANEL
Anion gap: 9 (ref 5–15)
BUN: 23 mg/dL (ref 8–23)
CO2: 27 mmol/L (ref 22–32)
Calcium: 9.2 mg/dL (ref 8.9–10.3)
Chloride: 101 mmol/L (ref 98–111)
Creatinine, Ser: 1.28 mg/dL — ABNORMAL HIGH (ref 0.44–1.00)
GFR, Estimated: 45 mL/min — ABNORMAL LOW (ref >60)
Glucose, Bld: 84 mg/dL (ref 70–99)
Potassium: 4.1 mmol/L (ref 3.5–5.1)
Sodium: 137 mmol/L (ref 135–145)

## 2022-12-07 LAB — MAGNESIUM: Magnesium: 2 mg/dL (ref 1.7–2.4)

## 2022-12-07 NOTE — Progress Notes (Signed)
Primary Care Physician: Charlott Rakes, MD Primary Cardiologist: Dr Burt Knack Primary Electrophysiologist: Dr Rayann Heman (previously) Referring Physician: Richardson Dopp PA-C   Tracy James is a 72 y.o. female with a history of persistent atrial fibrillation, tachycardia induced cardiomyopathy, AS, HTN, and tobacco abuse who presents for follow up in the [facility]. She was admitted in 11/2018 with AF with RVR.  EF was 40-45 on Echo and TEE demonstrated thrombus in the R and L atria and possible thrombus adjacent to the heart.  After a month of anticoagulation, she underwent repeat TEE-DCCV 01/20/2019.  Unfortunately, the TEE continued to show residual clot in the LA.  Therefore, DCCV was not performed.  She was seen in follow up on 02/2019 and her HR was uncontrolled.  Unfortunately, repeat DCCV was not arranged due to COVID-19 restrictions (elective procedures on hold).  She was placed on Digoxin for rate control. She was admitted 07/15/19-07/18/19 for dofetilide loading. She is on Eliquis for a CHADS2VASC score of 4.   Patient was hospitalized 03/2022 with lower GI bleed, was not transfused. GI performed a colonoscopy and bleeding felt to be diverticular.   On follow up today, patient reports that she has done very will with no tachypalpitations. She also denies any recent bleeding issues on anticoagulation.    Today, she denies symptoms of palpitations, chest pain, shortness of breath, orthopnea, PND, lower extremity edema, presyncope, syncope, snoring, daytime somnolence, or neurologic sequela. The patient is tolerating medications without difficulties and is otherwise without complaint today.    Atrial Fibrillation Risk Factors:  she does have symptoms of sleep apnea. she does not have a history of rheumatic fever. she does not have a history of alcohol use. The patient does not have a history of early familial atrial fibrillation or other arrhythmias.  she has a  BMI of Body mass index is 28.89 kg/m.Marland Kitchen Filed Weights   12/07/22 0824  Weight: 81.2 kg    Family History  Problem Relation Age of Onset   Arthritis Mother    Cancer Mother        colon cancer   Diabetes Mother    Hypertension Mother    Heart murmur Mother    Early death Brother    Heart murmur Brother    Lung cancer Brother    Arthritis Maternal Grandmother    Heart disease Maternal Grandmother        massive heart attack   Heart disease Paternal Grandmother    Asthma Brother    Diabetes Brother    Hypertension Brother    Heart murmur Brother    Heart disease Father        [id number]- ruptered     Atrial Fibrillation Management history:  Previous antiarrhythmic drugs: dofetilide Previous cardioversions: none Previous ablations: none CHADS2VASC score: 4 Anticoagulation history: Eliquis   Past Medical History:  Diagnosis Date   Aortic stenosis    Echo 06/2019: mod AS, mean 28 mmHg, peak velocity 3.6 m/s // Echo 9/21: EF 60-65 no RWMA, moderate LVH, normal RVSF, trivial MR, trivial AI, moderate aortic stenosis (mean gradient 36 mmHg; V-max 3.64 m/s; DI 0.18) [reviewed with Dr. Burt Knack - mod AS; rpt echo 1 yr]     CHF (congestive heart failure) (HCC)    Chronic systolic and diastolic   CKD (chronic kidney disease)    CKD IIIb   Hypertension    Nonischemic cardiomyopathy    Tachy induced CM // TEE 12/2018: EF 30-35 // Echo 06/2019:  EF 55-60 // Cor CTA 8/18: Ca score 0; no CAD   Persistent atrial fibrillation    Admx 12/19 w AF w RVR // R+L atrial thrombus on TEE - DCCV canceled; repeat TEE in 01/2019 w some residual clot // Dofetilide Rx started 07/2019 >> NSR restored without DCCV // CHADS-VASc 4 (6 if include LAA clot) >> Apixaban    Past Surgical History:  Procedure Laterality Date   COLONOSCOPY WITH PROPOFOL N/A 03/30/2022   Procedure: COLONOSCOPY WITH PROPOFOL;  Surgeon: Ladene Artist, MD;  Location: [facility] ENDOSCOPY;  Service: Gastroenterology;  Laterality: N/A;    POLYPECTOMY  03/30/2022   Procedure: POLYPECTOMY;  Surgeon: Ladene Artist, MD;  Location: [facility] ENDOSCOPY;  Service: Gastroenterology;;   TEE WITHOUT CARDIOVERSION N/A 12/06/2018   Procedure: TRANSESOPHAGEAL ECHOCARDIOGRAM (TEE);  Surgeon: Lelon Perla, MD;  Location: [facility] ENDOSCOPY;  Service: Cardiovascular;  Laterality: N/A;   TEE WITHOUT CARDIOVERSION N/A 01/20/2019   Procedure: TRANSESOPHAGEAL ECHOCARDIOGRAM (TEE);  Surgeon: [name], MD;  Location: [facility] ENDOSCOPY;  Service: Cardiovascular;  Laterality: N/A;   TONSILLECTOMY      Current Outpatient Medications  Medication Sig Dispense Refill   acetaminophen (TYLENOL) 500 MG tablet Take 500 mg by mouth every 6 (six) hours as needed (for pain.).     apixaban (ELIQUIS) 5 MG TABS tablet Take 1 tablet by mouth twice daily 180 tablet 1   atorvastatin (LIPITOR) 20 MG tablet Take 1 tablet (20 mg total) by mouth daily. 90 tablet 1   calcium-vitamin D (OSCAL WITH D) 500-200 MG-UNIT TABS tablet Take 1 tablet by mouth daily.      dofetilide (TIKOSYN) 250 MCG capsule Take 1 capsule by mouth twice daily 180 capsule 2   furosemide (LASIX) 40 MG tablet Take 1 tablet by mouth once daily 90 tablet 0   hydrALAZINE (APRESOLINE) 50 MG tablet TAKE 1 TABLET BY MOUTH THREE TIMES DAILY 270 tablet 1   loratadine (CLARITIN) 10 MG tablet Take 10 mg by mouth daily as needed for allergies.     metoprolol tartrate (LOPRESSOR) 25 MG tablet Take 1 tablet (25 mg total) by mouth 2 (two) times daily. 180 tablet 3   mometasone (ELOCON) 0.1 % ointment Apply 1 application  topically daily as needed (for skin rash). 45 g 1   nitroGLYCERIN (NITROSTAT) 0.4 MG SL tablet Place 1 tablet (0.4 mg total) under the tongue every 5 (five) minutes as needed for chest pain. 20 tablet 0   Olopatadine HCl 0.2 % SOLN Apply 1 drop to eye daily. One drop to affected eye once daily 2.5 mL 2   Omega-3 1000 MG CAPS Take 1,000 mg by mouth daily.      VITAMIN D PO Take 1 tablet by mouth daily.      No current facility-administered medications for this encounter.    No Known Allergies  Social History   Socioeconomic History   Marital status: Divorced    Spouse name: Not on file   Number of children: Not on file   Years of education: Not on file   Highest education level: Not on file  Occupational History   Not on file  Tobacco Use   Smoking status: Former    Types: Cigarettes    Quit date: 12/05/2011    Years since quitting: 11.0   Smokeless tobacco: Never   Tobacco comments:     Former smoker 06/15/22  Vaping Use   Vaping Use: Never used  Substance and Sexual Activity   Alcohol use:  Not Currently    Alcohol/week: 1.0 standard drink of alcohol    Types: 1 Glasses of wine per week    Comment: stop drinking occ 2 months 06/15/22   Drug use: No   Sexual activity: Not Currently    Birth control/protection: None  Other Topics Concern   Not on file  Social History Narrative   Not on file   Social Determinants of Health   Financial Resource Strain: Low Risk  (10/31/2022)   Overall Financial Resource Strain (CARDIA)    Difficulty of Paying Living Expenses: Not very hard  Food Insecurity: No Food Insecurity (10/31/2022)   Hunger Vital Sign    Worried About Running Out of Food in the Last Year: Never true    Ran Out of Food in the Last Year: Never true  Transportation Needs: No Transportation Needs (10/31/2022)   PRAPARE - Hydrologist (Medical): No    Lack of Transportation (Non-Medical): No  Physical Activity: Insufficiently Active (10/31/2022)   Exercise Vital Sign    Days of Exercise per Week: 4 days    Minutes of Exercise per Session: 30 min  Stress: No Stress Concern Present (10/31/2022)   [place]    Feeling of Stress : Not at all  Social Connections: Moderately Integrated (10/31/2022)   Social Connection and Isolation Panel [NHANES]    Frequency of  Communication with Friends and Family: More than three times a week    Frequency of Social Gatherings with Friends and Family: Once a week    Attends Religious Services: More than 4 times per year    Active Member of [employer] or Organizations: Yes    Attends Archivist Meetings: 1 to 4 times per year    Marital Status: Divorced  Intimate Partner Violence: Not At Risk (10/31/2022)   Humiliation, Afraid, Rape, and Kick questionnaire    Fear of Current or Ex-Partner: No    Emotionally Abused: No    Physically Abused: No    Sexually Abused: No     ROS- All systems are reviewed and negative except as per the HPI above.  Physical Exam: Vitals:   12/07/22 0824  BP: 120/66  Pulse: 71  Weight: 81.2 kg  Height: '5\' 6"'$  (1.676 m)     GEN- The patient is a well appearing female, alert and oriented x 3 today.   HEENT-head normocephalic, atraumatic, sclera clear, conjunctiva pink, hearing intact, trachea midline. Lungs- Clear to ausculation bilaterally, normal work of breathing Heart- Regular rate and rhythm, no murmurs, rubs or gallops  GI- soft, NT, ND, + BS Extremities- no clubbing, cyanosis, or edema MS- no significant deformity or atrophy Skin- no rash or lesion Psych- euthymic mood, full affect Neuro- strength and sensation are intact   Wt Readings from Last 3 Encounters:  12/07/22 81.2 kg  10/31/22 79.9 kg  08/18/22 79.6 kg    EKG today demonstrates  SR, PAC Vent. rate 71 BPM PR interval 166 ms QRS duration 82 ms QT/QTcB 430/467 ms   Echo 02/21/22 demonstrated   1. Left ventricular ejection fraction, by estimation, is 55 to 60%. The  left ventricle has normal function. The left ventricle has no regional  wall motion abnormalities. There is mild left ventricular hypertrophy.  Left ventricular diastolic parameters are consistent with Grade II diastolic dysfunction (pseudonormalization).   2. Right ventricular systolic function is mildly reduced. The right   ventricular size is normal. Tricuspid regurgitation  signal is inadequate  for assessing PA pressure.   3. Left atrial size was moderately dilated.   4. The mitral valve is normal in structure. No evidence of mitral valve  regurgitation. No evidence of mitral stenosis.   5. The aortic valve is tricuspid. There is moderate calcification of the  aortic valve. Aortic valve regurgitation is trivial. Severe aortic valve  stenosis (paradoxical low flow/low gradient severe AS). Aortic valve area, by VTI measures 0.52 cm. Aortic   valve mean gradient measures 34.0 mmHg.   6. The inferior vena cava is normal in size with greater than 50%  respiratory variability, suggesting right atrial pressure of 3 mmHg.   Epic records are reviewed at length today  CHA2DS2-VASc Score = 4  The patient's score is based upon: CHF History: 1 HTN History: 1 Diabetes History: 0 Stroke History: 0 Vascular Disease History: 0 Age Score: 1 Gender Score: 1       ASSESSMENT AND PLAN: 1. Persistent Atrial Fibrillation (ICD10:  I48.19) The patient's CHA2DS2-VASc score is 4, indicating a 4.8% annual risk of stroke.   Patient appears to be maintaining SR. Continue dofetilide 250 mcg BID. QT stable.  Continue metoprolol 25 mg BID Continue Eliquis 5 mg BID Check bmet/mag today.   2. Secondary Hypercoagulable State (ICD10:  D68.69) The patient is at significant risk for stroke/thromboembolism based upon her CHA2DS2-VASc Score of 4.  Continue Apixaban (Eliquis). H/o diverticular bleeding. We discussed Watchman previously, patient opted not to proceed at this time.   3. Cardiomyopathy  EF recovered Fluid status appears stable today.  4. HTN Stable, no changes today.  5. Aortic stenosis  Severe stage C Followed by Dr Burt Knack and structural heart team.   Follow up in the AF clinic in 6 months.    [facility] [address] [place], [place]  [id number] [phone] 12/07/2022 8:32 AM

## 2022-12-08 ENCOUNTER — Other Ambulatory Visit: Payer: Self-pay | Admitting: Cardiovascular Disease

## 2022-12-25 ENCOUNTER — Other Ambulatory Visit: Payer: Self-pay | Admitting: Family Medicine

## 2022-12-25 DIAGNOSIS — Z1231 Encounter for screening mammogram for malignant neoplasm of breast: Secondary | ICD-10-CM

## 2022-12-29 ENCOUNTER — Other Ambulatory Visit: Payer: Self-pay | Admitting: Internal Medicine

## 2022-12-29 DIAGNOSIS — N1831 Chronic kidney disease, stage 3a: Secondary | ICD-10-CM

## 2022-12-29 DIAGNOSIS — I5022 Chronic systolic (congestive) heart failure: Secondary | ICD-10-CM

## 2022-12-29 DIAGNOSIS — I10 Essential (primary) hypertension: Secondary | ICD-10-CM

## 2022-12-29 NOTE — Telephone Encounter (Signed)
Requested Prescriptions  Pending Prescriptions Disp Refills   furosemide (LASIX) 40 MG tablet [Pharmacy Med Name: Furosemide 40 MG Oral Tablet] 90 tablet 0    Sig: Take 1 tablet by mouth once daily     Cardiovascular:  Diuretics - Loop Failed - 12/29/2022  1:55 PM      Failed - Cr in normal range and within 180 days    Creat  Date Value Ref Range Status  09/26/2017 1.03 (H) 0.50 - 0.99 mg/dL Final    Comment:    For patients >70 years of age, the reference limit for Creatinine is approximately 13% higher for people identified as African-American. .    Creatinine, Ser  Date Value Ref Range Status  12/07/2022 1.28 (H) 0.44 - 1.00 mg/dL Final         Passed - K in normal range and within 180 days    Potassium  Date Value Ref Range Status  12/07/2022 4.1 3.5 - 5.1 mmol/L Final         Passed - Ca in normal range and within 180 days    Calcium  Date Value Ref Range Status  12/07/2022 9.2 8.9 - 10.3 mg/dL Final         Passed - Na in normal range and within 180 days    Sodium  Date Value Ref Range Status  12/07/2022 137 135 - 145 mmol/L Final  04/25/2022 141 134 - 144 mmol/L Final         Passed - Cl in normal range and within 180 days    Chloride  Date Value Ref Range Status  12/07/2022 101 98 - 111 mmol/L Final         Passed - Mg Level in normal range and within 180 days    Magnesium  Date Value Ref Range Status  12/07/2022 2.0 1.7 - 2.4 mg/dL Final    Comment:    Performed at [facility], [place] [address], [place], [place] [address]         Passed - Last BP in normal range    BP Readings from Last 1 Encounters:  12/07/22 120/66         Passed - Valid encounter within last 6 months    Recent Outpatient Visits           1 month ago Encounter for [employer] annual wellness exam   [place], Charlane Ferretti, MD   5 months ago Atrial fibrillation, unspecified type [facility])   [place], Enobong, MD   8 months ago Hospital discharge follow-up   [facility] Gildardo Pounds, NP   1 year ago Encounter for [employer] annual wellness exam   [place], Enobong, MD   1 year ago Allergic conjunctivitis of both eyes   [place], MD       Future Appointments             In 1 month [place], Casimer Bilis Crozier   In 1 month Sherren Mocha, MD [place] [facility], [place]

## 2023-01-31 ENCOUNTER — Ambulatory Visit: Payer: Medicare PPO | Admitting: Physician Assistant

## 2023-01-31 ENCOUNTER — Ambulatory Visit: Payer: Medicare PPO | Admitting: Family Medicine

## 2023-02-14 ENCOUNTER — Ambulatory Visit
Admission: RE | Admit: 2023-02-14 | Discharge: 2023-02-14 | Disposition: A | Discharge status: Home / Self Care | Payer: Medicare PPO | Source: Ambulatory Visit | Attending: Family Medicine | Admitting: Family Medicine

## 2023-02-14 DIAGNOSIS — Z1231 Encounter for screening mammogram for malignant neoplasm of breast: Secondary | ICD-10-CM

## 2023-02-19 ENCOUNTER — Ambulatory Visit (HOSPITAL_BASED_OUTPATIENT_CLINIC_OR_DEPARTMENT_OTHER): Payer: Medicare PPO

## 2023-02-19 ENCOUNTER — Ambulatory Visit: Payer: Medicare PPO | Attending: Cardiovascular Disease | Admitting: Cardiovascular Disease

## 2023-02-19 ENCOUNTER — Encounter: Payer: Self-pay | Admitting: Cardiovascular Disease

## 2023-02-19 VITALS — BP 91/55 | HR 51 | Ht 66.0 in | Wt 177.8 lb

## 2023-02-19 DIAGNOSIS — I480 Paroxysmal atrial fibrillation: Secondary | ICD-10-CM | POA: Diagnosis not present

## 2023-02-19 DIAGNOSIS — I10 Essential (primary) hypertension: Secondary | ICD-10-CM | POA: Insufficient documentation

## 2023-02-19 DIAGNOSIS — I4819 Other persistent atrial fibrillation: Secondary | ICD-10-CM | POA: Insufficient documentation

## 2023-02-19 DIAGNOSIS — I350 Nonrheumatic aortic (valve) stenosis: Secondary | ICD-10-CM | POA: Diagnosis not present

## 2023-02-19 DIAGNOSIS — N838 Other noninflammatory disorders of ovary, fallopian tube and broad ligament: Secondary | ICD-10-CM | POA: Diagnosis not present

## 2023-02-19 LAB — ECHOCARDIOGRAM COMPLETE
AR max vel: 0.72 cm2
AV Area VTI: 0.61 cm2
AV Area mean vel: 0.65 cm2
AV Mean grad: 42 mmHg
AV Peak grad: 66.7 mmHg
Ao pk vel: 4.09 m/s
Area-P 1/2: 2.69 cm2
S' Lateral: 2.2 cm

## 2023-02-19 NOTE — Progress Notes (Signed)
Cardiology Office Note:    Date:  02/19/2023   ID:  Tracy James August 12, 1951, MRN [id number]  PCP:  Charlott Rakes, [place] Prov[facility] Cardiologist:  Sherren Mocha, MD     Referring MD: Charlott Rakes, MD   Chief Complaint  Patient presents with   Atrial Fibrillation    History of Present Illness:    Tracy James is a 72 y.o. female presenting for follow-up aortic stenosis and persistent atrial fibrillation.  The patient has been noted to have moderate to severe aortic stenosis with thickening of her aortic valve leaflets, without severe leaflet calcification.  Doppler data over time has suggested severe aortic stenosis or severe paradoxical low-flow low gradient aortic stenosis.  The patient has a bicuspid valve with a raphae between the right and left coronary cusps.  Her aortic valve calcium score on cardiac CT was only 408.  She was hospitalized in April 2023 with a lower GI bleed which was felt to be diverticular.  She has been back on anticoagulation without further bleeding problems.  She declined watchman evaluation on follow-up with the A-fib clinic.  She has been maintaining sinus rhythm on dofetilide.  She reportedly has had no further bleeding problems back on apixaban.  The patient is here alone today. She reports feeling 'loopy' and 'dizzy' at times over the last several weeks.  She has noticed the symptoms when she first stands up and she states that she has to steady herself for a minute before walking at times.  She has not had any exertional symptoms and she specifically denies chest pain, chest pressure, shortness of breath, orthopnea, PND, or leg swelling.  She has had no heart palpitations.  She states that in fact she really feels good at this point and she is not eager to have any cardiac intervention done.  She did have an echo this morning prior to her office visit and we reviewed these findings at length today.  While she has had episodes  of dizziness, she denies feeling presyncopal and she has not had frank syncope.  Past Medical History:  Diagnosis Date   Aortic stenosis    Echo 06/2019: mod AS, mean 28 mmHg, peak velocity 3.6 m/s // Echo 9/21: EF 60-65 no RWMA, moderate LVH, normal RVSF, trivial MR, trivial AI, moderate aortic stenosis (mean gradient 36 mmHg; V-max 3.64 m/s; DI 0.18) [reviewed with Dr. Burt Knack - mod AS; rpt echo 1 yr]     CHF (congestive heart failure) ([place])    Chronic systolic and diastolic   CKD (chronic kidney disease)    CKD IIIb   Hypertension    Nonischemic cardiomyopathy    Tachy induced CM // TEE 12/2018: EF 30-35 // Echo 06/2019: EF 55-60 // Cor CTA 8/18: Ca score 0; no CAD   Persistent atrial fibrillation    Admx 12/19 w AF w RVR // R+L atrial thrombus on TEE - DCCV canceled; repeat TEE in 01/2019 w some residual clot // Dofetilide Rx started 07/2019 >> NSR restored without DCCV // CHADS-VASc 4 (6 if include LAA clot) >> Apixaban     Past Surgical History:  Procedure Laterality Date   COLONOSCOPY WITH PROPOFOL N/A 03/30/2022   Procedure: COLONOSCOPY WITH PROPOFOL;  Surgeon: Ladene Artist, MD;  Location: [facility] ENDOSCOPY;  Service: Gastroenterology;  Laterality: N/A;   POLYPECTOMY  03/30/2022   Procedure: POLYPECTOMY;  Surgeon: Ladene Artist, MD;  Location: [facility] ENDOSCOPY;  Service: Gastroenterology;;   TEE WITHOUT  CARDIOVERSION N/A 12/06/2018   Procedure: TRANSESOPHAGEAL ECHOCARDIOGRAM (TEE);  Surgeon: Lelon Perla, MD;  Location: [facility] ENDOSCOPY;  Service: Cardiovascular;  Laterality: N/A;   TEE WITHOUT CARDIOVERSION N/A 01/20/2019   Procedure: TRANSESOPHAGEAL ECHOCARDIOGRAM (TEE);  Surgeon: [name], MD;  Location: [facility] ENDOSCOPY;  Service: Cardiovascular;  Laterality: N/A;   TONSILLECTOMY      Current Medications: Current Meds  Medication Sig   acetaminophen (TYLENOL) 500 MG tablet Take 500 mg by mouth every 6 (six) hours as needed (for pain.).   apixaban (ELIQUIS) 5 MG TABS tablet  Take 1 tablet by mouth twice daily   atorvastatin (LIPITOR) 20 MG tablet Take 1 tablet (20 mg total) by mouth daily.   calcium-vitamin D (OSCAL WITH D) 500-200 MG-UNIT TABS tablet Take 1 tablet by mouth daily.    dofetilide (TIKOSYN) 250 MCG capsule Take 1 capsule by mouth twice daily   furosemide (LASIX) 40 MG tablet Take 1 tablet by mouth once daily   loratadine (CLARITIN) 10 MG tablet Take 10 mg by mouth daily as needed for allergies.   metoprolol tartrate (LOPRESSOR) 25 MG tablet Take 1 tablet (25 mg total) by mouth 2 (two) times daily.   mometasone (ELOCON) 0.1 % ointment Apply 1 application  topically daily as needed (for skin rash).   nitroGLYCERIN (NITROSTAT) 0.4 MG SL tablet Place 1 tablet (0.4 mg total) under the tongue every 5 (five) minutes as needed for chest pain.   Olopatadine HCl 0.2 % SOLN Apply 1 drop to eye daily. One drop to affected eye once daily   Omega-3 1000 MG CAPS Take 1,000 mg by mouth daily.    VITAMIN D PO Take 1 tablet by mouth daily.   [DISCONTINUED] hydrALAZINE (APRESOLINE) 50 MG tablet TAKE 1 TABLET BY MOUTH THREE TIMES DAILY     Allergies:   Patient has no known allergies.   Social History   Socioeconomic History   Marital status: Divorced    Spouse name: Not on file   Number of children: Not on file   Years of education: Not on file   Highest education level: Not on file  Occupational History   Not on file  Tobacco Use   Smoking status: Former    Types: Cigarettes    Quit date: 12/05/2011    Years since quitting: 11.2   Smokeless tobacco: Never   Tobacco comments:     Former smoker 06/15/22  Vaping Use   Vaping Use: Never used  Substance and Sexual Activity   Alcohol use: Not Currently    Alcohol/week: 1.0 standard drink of alcohol    Types: 1 Glasses of wine per week    Comment: stop drinking occ 2 months 06/15/22   Drug use: No   Sexual activity: Not Currently    Birth control/protection: None  Other Topics Concern   Not on file   Social History Narrative   Not on file   Social Determinants of Health   Financial Resource Strain: Low Risk  (10/31/2022)   Overall Financial Resource Strain (CARDIA)    Difficulty of Paying Living Expenses: Not very hard  Food Insecurity: No Food Insecurity (10/31/2022)   Hunger Vital Sign    Worried About Running Out of Food in the Last Year: Never true    Echo in the Last Year: Never true  Transportation Needs: No Transportation Needs (10/31/2022)   PRAPARE - Hydrologist (Medical): No    Lack of Transportation (Non-Medical):  No  Physical Activity: Insufficiently Active (10/31/2022)   Exercise Vital Sign    Days of Exercise per Week: 4 days    Minutes of Exercise per Session: 30 min  Stress: No Stress Concern Present (10/31/2022)   [place]    Feeling of Stress : Not at all  Social Connections: Moderately Integrated (10/31/2022)   Social Connection and Isolation Panel [NHANES]    Frequency of Communication with Friends and Family: More than three times a week    Frequency of Social Gatherings with Friends and Family: Once a week    Attends Religious Services: More than 4 times per year    Active Member of [employer] or Organizations: Yes    Attends Music therapist: 1 to 4 times per year    Marital Status: Divorced     Family History: The patient's family history includes Arthritis in her maternal grandmother and mother; Asthma in her brother; Cancer in her mother; Diabetes in her brother and mother; Early death in her brother; Heart disease in her father, maternal grandmother, and paternal grandmother; Heart murmur in her brother, brother, and mother; Hypertension in her brother and mother; Lung cancer in her brother.  ROS:   Please see the history of present illness.    All other systems reviewed and are negative.  EKGs/Labs/Other Studies Reviewed:     The following studies were reviewed today: Cardiac Studies & Procedures       ECHOCARDIOGRAM  ECHOCARDIOGRAM COMPLETE 02/19/2023  Narrative ECHOCARDIOGRAM REPORT    Patient Name:   Tracy James [facility] Date of Exam: 02/19/2023 Medical Rec #:  [id number]       Height:       66.0 in Accession #:    [id number]      Weight:       179.0 lb Date of Birth:  1951/04/07       BSA:          1.908 m Patient Age:    66 years        BP:           120/66 mmHg Patient Gender: F               HR:           63 bpm. Exam Location:  [place]  Procedure: 2D Echo, Cardiac Doppler, Color Doppler, 3D Echo and Strain Analysis  Indications:    Atrial Fibrillation I48.91  History:        Patient has prior history of Echocardiogram examinations, most recent 02/01/2022. Aortic Valve Disease; Risk Factors:Hypertension.  Sonographer:    Mikki Santee RDCS Referring Phys: [place]   1. Left ventricular ejection fraction, by estimation, is 60 to 65%. Left ventricular ejection fraction by 3D volume is 59 %. The left ventricle has normal function. The left ventricle has no regional wall motion abnormalities. There is mild left ventricular hypertrophy. Left ventricular diastolic parameters are consistent with Grade II diastolic dysfunction (pseudonormalization). The average left ventricular global longitudinal strain is -18.9 %. The global longitudinal strain is normal, however demonstrates a pattern of apical sparing. 2. Right ventricular systolic function is normal. The right ventricular size is normal. There is mildly elevated pulmonary artery systolic pressure. The estimated right ventricular systolic pressure is [id number] mmHg. 3. Left atrial size was severely dilated. 4. The mitral valve is grossly normal. Trivial mitral valve regurgitation. No evidence of mitral stenosis. 5. Possible bicuspid  aortic valve with fusion of right and left coronary cusps and calcification of posterior,  noncoronary cusp. The aortic valve is abnormal. There is moderate calcification of the aortic valve. Aortic valve regurgitation is trivial. Severe aortic valve stenosis. Aortic valve area, by VTI measures 0.61 cm. Aortic valve mean gradient measures 42.0 mmHg. Aortic valve Vmax measures 4.08 m/s. 6. The inferior vena cava is normal in size with greater than 50% respiratory variability, suggesting right atrial pressure of 3 mmHg.  FINDINGS Left Ventricle: Left ventricular ejection fraction, by estimation, is 60 to 65%. Left ventricular ejection fraction by 3D volume is 59 %. The left ventricle has normal function. The left ventricle has no regional wall motion abnormalities. The average left ventricular global longitudinal strain is -18.9 %. The global longitudinal strain is normal. The left ventricular internal cavity size was normal in size. There is mild left ventricular hypertrophy. Left ventricular diastolic parameters are consistent with Grade II diastolic dysfunction (pseudonormalization).  Right Ventricle: The right ventricular size is normal. No increase in right ventricular wall thickness. Right ventricular systolic function is normal. There is mildly elevated pulmonary artery systolic pressure. The tricuspid regurgitant velocity is 2.99 m/s, and with an assumed right atrial pressure of 3 mmHg, the estimated right ventricular systolic pressure is [id number] mmHg.  Left Atrium: Left atrial size was severely dilated.  Right Atrium: Right atrial size was normal in size.  Pericardium: There is no evidence of pericardial effusion.  Mitral Valve: The mitral valve is grossly normal. Trivial mitral valve regurgitation. No evidence of mitral valve stenosis.  Tricuspid Valve: The tricuspid valve is normal in structure. Tricuspid valve regurgitation is mild . No evidence of tricuspid stenosis.  Aortic Valve: Possible bicuspid aortic valve with fusion of right and left coronary cusps and calcification  of posterior, noncoronary cusp. The aortic valve is abnormal. There is moderate calcification of the aortic valve. Aortic valve regurgitation is trivial. Severe aortic stenosis is present. Aortic valve mean gradient measures 42.0 mmHg. Aortic valve peak gradient measures 66.7 mmHg. Aortic valve area, by VTI measures 0.61 cm.  Pulmonic Valve: The pulmonic valve was normal in structure. Pulmonic valve regurgitation is trivial. No evidence of pulmonic stenosis.  Aorta: The aortic root is normal in size and structure.  Venous: The inferior vena cava is normal in size with greater than 50% respiratory variability, suggesting right atrial pressure of 3 mmHg.  IAS/Shunts: No atrial level shunt detected by color flow Doppler.   LEFT VENTRICLE PLAX 2D LVIDd:         3.60 cm         Diastology LVIDs:         2.20 cm         LV e' medial:    4.47 cm/s LV PW:         1.20 cm         LV E/e' medial:  28.0 LV IVS:        1.10 cm         LV e' lateral:   5.87 cm/s LVOT diam:     2.20 cm         LV E/e' lateral: 21.3 LV SV:         74 LV SV Index:   39              2D LVOT Area:     3.80 cm        Longitudinal Strain 2D Strain GLS  -18.9 % Avg:  3D Volume EF LV 3D EF:    Left ventricul ar ejection fraction by 3D volume is 59 %.  3D Volume EF: 3D EF:        59 % LV EDV:       140 ml LV ESV:       58 ml LV SV:        83 ml  RIGHT VENTRICLE RV Basal diam:  3.10 cm RV Mid diam:    2.70 cm RV S prime:     6.31 cm/s TAPSE (M-mode): 1.8 cm  LEFT ATRIUM              Index        RIGHT ATRIUM           Index LA diam:        4.20 cm  2.20 cm/m   RA Area:     13.00 cm LA Vol (A2C):   123.0 ml 64.48 ml/m  RA Volume:   30.10 ml  15.78 ml/m LA Vol (A4C):   87.4 ml  45.81 ml/m LA Biplane Vol: 107.0 ml 56.09 ml/m AORTIC VALVE AV Area (Vmax):    0.72 cm AV Area (Vmean):   0.65 cm AV Area (VTI):     0.61 cm AV Vmax:           408.50 cm/s AV Vmean:          299.000 cm/s AV VTI:             1.200 m AV Peak Grad:      66.7 mmHg AV Mean Grad:      42.0 mmHg LVOT Vmax:         77.60 cm/s LVOT Vmean:        51.400 cm/s LVOT VTI:          0.194 m LVOT/AV VTI ratio: 0.16  AORTA Ao Root diam: 3.00 cm Ao Asc diam:  3.00 cm  MITRAL VALVE                TRICUSPID VALVE MV Area (PHT): 2.69 cm     TR Peak grad:   35.8 mmHg MV Decel Time: 282 msec     TR Vmax:        299.00 cm/s MV E velocity: 125.00 cm/s MV A velocity: 65.90 cm/s   SHUNTS MV E/A ratio:  1.90         Systemic VTI:  0.19 m Systemic Diam: 2.20 cm  Cherlynn Kaiser MD Electronically signed by Cherlynn Kaiser MD Signature Date/Time: 02/19/2023/1:10:10 PM    Final   TEE  ECHO TEE 01/20/2019  Narrative TRANSESOPHOGEAL ECHO REPORT    Patient Name:   SHAWNEQUA LORDEN Date of Exam: 01/20/2019 Medical Rec #:  [id number]       Height:       66.0 in Accession #:    [id number]      Weight:       173.8 lb Date of Birth:  [birth year]       BSA:          1.88 m Patient Age:    67 years        BP:           123/70 mmHg Patient Gender: F               HR:           130 bpm. Exam Location:  Inpatient   Procedure: Transesophageal Echo, Color  Doppler, Cardiac Doppler and Intracardiac Opacification Agent  Indications:     Atrial fibrillation 427.31 / I48.91  History:         Patient has prior history of Echocardiogram examinations, most recent 12/06/2018. Aortic Valve Disease; Signs/Symptoms: Murmur; Risk Factors: Hypertension and Current Smoker.  Sonographer:     Darlina Sicilian RDCS Referring Phys:  [id number] WEAVER Diagnosing Phys: Cherlynn Kaiser MD   Sonographer Comments: Limited TEE to rule out LAA thrombus. Definity used to better visualize LAA.     PROCEDURE: After discussion of the risks and benefits of a TEE, an informed consent was obtained from the patient. Patients was monitored while under deep sedation. Imaged were obtained with the patient in a left lateral decubitus position. The  patient's vital signs; including heart rate, blood pressure, and oxygen saturation; remained stable throughout the procedure. The patient developed no complications during the procedure.  IMPRESSIONS   1. The left ventricle has moderate-severely reduced systolic function, with an ejection fraction of 30-35%. The cavity size was normal. 2. Evidence of atrial level shunting detected by color flow Doppler. 3. Small fixed left atrial thrombus on the interior of the left atrial appendage. This was not well seen with 2D echo, however appears present with Definity enhancement (see clips 30-33). 4. No right atrial appendage thrombus seen. 5. Cardioversion not performed, referring provider notified.  FINDINGS Left Ventricle: The left ventricle has moderate-severely reduced systolic function, with an ejection fraction of 30-35%. The cavity size was normal. There is no increase in left ventricular wall thickness. Definity contrast agent was given IV to delineate the left ventricular endocardial borders. Right Ventricle: The right ventricle has normal systolic function. The cavity was normal. There is no increase in right ventricular wall thickness.   Left Atrium: Left atrial size was normal in size. There is a small fixed left atrial thrombus seen adhering to the interior wall of the left atrial appendage. Right Atrium: Right atrial size was normal in size.  Interatrial Septum: Evidence of atrial level shunting detected by color flow Doppler. Pericardium: There is no evidence of pericardial effusion.  AORTIC VALVE AV Vmax:      296.00 cm/s AV Vmean:     227.000 cm/s AV VTI:       0.587 m AV Peak Grad: 35.0 mmHg AV Mean Grad: 22.0 mmHg   Cherlynn Kaiser MD Electronically signed by Cherlynn Kaiser MD Signature Date/Time: 01/20/2019/5:00:27 PM    Final    CT SCANS  CT CORONARY MORPH W/CTA COR W/SCORE 10/04/2021  Addendum 10/04/2021  6:17 AM ADDENDUM REPORT: 10/04/2021  06:15  ADDENDUM: Extracardiac findings were described separately under dictation for contemporaneously obtained CTA chest, abdomen and pelvis dated 09/26/2021. Please see that report for full description of relevant extracardiac findings.   Electronically Signed By: [name] M.D. On: 10/04/2021 06:15  Narrative CLINICAL DATA:  Pre-op transcatheter aortic valve replacement (TAVR)  EXAM: Cardiac TAVR CT  TECHNIQUE: The patient was scanned on a Siemens Force [id number] slice scanner. A 120 kV retrospective scan was triggered in the descending thoracic aorta at 111 HU's. Gantry rotation speed was 270 msecs and collimation was .9 mm. The 3D data set was reconstructed in 5% intervals of the R-R cycle. Systolic and diastolic phases were analyzed on a dedicated work station using MPR, MIP and VRT modes. The patient received 116mL OMNIPAQUE IOHEXOL 350 MG/ML SOLN of contrast.  FINDINGS: Aortic Valve: Bicuspid aortic valve with raphe between right and left coronary cusps. Severely Reduced  cusp separation. Severely thickened, mildly calcified aortic valve cusps.  AV calcium score: 408  Virtual Basal Annulus Measurements:  Maximum/Minimum Diameter: 29.6 x 21.3 mm  Perimeter: 80.9 mm  Area: 493 mm2  No significant LVOT calcifications.  Based on these measurements, the annulus would be suitable for a 26 mm Sapien 3 valve.  Sinus of Valsalva Measurements: 29 mm x 25 mm  Sinus of Valsalva Height:  Left: 16.5 mm  Right: 19.7 mm  Aorta: Severe aortic atherosclerosis. Conventional 3 vessel branch pattern of aortic arch. No coarctation of the aorta.  Sinotubular Junction:  25 mm  Ascending Thoracic Aorta: 28 mm  Aortic Arch:  24 mm  Descending Thoracic Aorta:  21 mm  Coronary Artery Height above Annulus:  Left Main: 12 mm  Right Coronary: 15.4 mm  Coronary Arteries: Normal coronary artery origins.  Optimum Fluoroscopic Angle for Delivery: LAO 1 CAU 1  Slow  flowing contrast in left atrial appendage however no definite findings for thrombus seen.  IMPRESSION: 1. Bicuspid aortic valve with raphe between right and left coronary cusps. Severely Reduced cusp separation. Severely thickened, mildly calcified aortic valve cusps.  2. AV calcium score: 408  3. Annulus Area: 493 mm2. No significant LVOT calcifications. The annulus would be suitable for a 26 mm Sapien 3 valve. Recommend Heart Team discussion for TAVR vs. surgical aortic valve replacement.  4.  Sufficient coronary artery height from annulus.  5. Optimum Fluoroscopic Angle for Delivery: LAO 1 CAU 1  6. Slow flowing contrast in left atrial appendage however no definite findings for thrombus seen.  7. Severe aortic atherosclerosis. Conventional 3 vessel branch pattern of aortic arch. No coarctation of the aorta. Normal caliber thoracic aorta.  Electronically Signed: By: Cherlynn Kaiser M.D. On: 09/26/2021 17:09   CT SCANS  CT CORONARY MORPH W/CTA COR W/SCORE 07/25/2017  Addendum 07/25/2017  5:11 PM ADDENDUM REPORT: 07/25/2017 17:09  CLINICAL DATA:  72 year old female with past medical history of hypertension, tobacco use and heart murmur who presented to the ED with complaints of ongoing chest pain.  EXAM: Cardiac/Coronary  CT  TECHNIQUE: The patient was scanned on a [employer].  FINDINGS: A 100 kV prospective scan was triggered in the descending thoracic aorta at 111 HU's. Axial non-contrast 3 mm slices were carried out through the heart. The data set was analyzed on a dedicated work station and scored using the [place]. Gantry rotation speed was 250 msecs and collimation was .5 mm. 5 mg of iv Metoprolol and 0.8 mg of sl NTG was given. The 3D data set was reconstructed in 5% intervals of the 67-82 % of the R-R cycle. Diastolic phases were analyzed on a dedicated work station using MPR, MIP and VRT modes. The patient received 80 cc of  contrast.  Aorta: Normal size. Mild diffuse calcifications in the aortic arch. No dissection.  Aortic Valve: Trileaflet, unusually thickened leaflets and mild calcifications in the non-coronary cusp. Leaflets opening can't be evaluated as only diastolic images were obtained.  Coronary Arteries:  Normal coronary origin.  Right dominance.  RCA is a large dominant artery that gives rise to PDA and PLVB. There is no plaque.  Left main is a large artery that gives rise to LAD and LCX arteries.  LAD is a large vessel that gives rise to two diagonal branches and has no plaque.  LCX is a non-dominant artery that gives rise to one OM1 branch. There is no plaque.  Other findings:  Normal pulmonary vein  drainage into the left atrium.  Normal let atrial appendage without a thrombus.  Normal size of the pulmonary artery.  IMPRESSION: 1. Coronary calcium score of 0. This was 0 percentile for age and sex matched control.  2. Normal coronary origin with right dominance.  3. No evidence of CAD.  4. Unusually thickened leaflets of the tricuspid aortic valve mild calcifications in the non-coronary cusp. Leaflets opening can't be evaluated as only diastolic images were obtained. An echocardiogram is recommended for further evaluation.  Ena Dawley   Electronically Signed By: Ena Dawley On: 07/25/2017 17:09  Narrative EXAM: OVER-READ INTERPRETATION  CT CHEST  The following report is an over-read performed by radiologist Dr. Forest Gleason [facility] on 07/25/2017. This over-read does not include interpretation of cardiac or coronary anatomy or pathology. The coronary CTA interpretation by the cardiologist is attached.  COMPARISON:  Chest radiograph of earlier today. Diagnostic chest CT of 07/19/2017.  FINDINGS: Vascular: Normal thoracic aortic caliber. Aortic atherosclerosis. No aortic dissection. No central pulmonary embolism, on this non-dedicated  study.  Mediastinum/Nodes: 9 mm right paratracheal node is not pathologic by size criteria. No hilar adenopathy.  Lungs/Pleura: No pleural fluid.  Clear imaged lungs.  Upper Abdomen: Normal imaged portions of the liver, spleen, stomach.  Musculoskeletal: No acute osseous abnormality. Mild thoracic spondylosis.  IMPRESSION: 1. No acute findings in the extracardiac imaged chest. 2.  Aortic Atherosclerosis (ICD10-I70.0).  Electronically Signed: By: Abigail Miyamoto M.D. On: 07/25/2017 16:59           EKG:  EKG is not ordered today.    Recent Labs: 04/10/2022: ALT 26; Hemoglobin 13.0; Platelets 270 12/07/2022: BUN 23; Creatinine, Ser 1.28; Magnesium 2.0; Potassium 4.1; Sodium 137  Recent Lipid Panel    Component Value Date/Time   CHOL 144 10/19/2021 1036   TRIG 126 10/19/2021 1036   HDL 40 10/19/2021 1036   CHOLHDL 4.1 11/07/2019 1058   CHOLHDL 3.2 09/26/2017 0910   VLDL 52 (H) 09/22/2016 0829   LDLCALC 81 10/19/2021 1036   LDLCALC 95 09/26/2017 0910     Risk Assessment/Calculations:    CHA2DS2-VASc Score = 4   This indicates a 4.8% annual risk of stroke. The patient's score is based upon: CHF History: 1 HTN History: 1 Diabetes History: 0 Stroke History: 0 Vascular Disease History: 0 Age Score: 1 Gender Score: 1               Physical Exam:    VS:  BP (!) 91/55   Pulse (!) 51   Ht 5\' 6"  (1.676 m)   Wt 177 lb 12.8 oz (80.6 kg)   SpO2 95%   BMI 28.70 kg/m     Wt Readings from Last 3 Encounters:  02/19/23 177 lb 12.8 oz (80.6 kg)  12/07/22 179 lb (81.2 kg)  10/31/22 176 lb 3.2 oz (79.9 kg)     GEN: Well nourished, well developed in no acute distress HEENT: Normal NECK: No JVD; bilateral carotid bruits LYMPHATICS: No lymphadenopathy CARDIAC: RRR, 3/6 harsh crescendo decrescendo murmur heard best at the right upper sternal border with absent A2 RESPIRATORY:  Clear to auscultation without rales, wheezing or rhonchi  ABDOMEN: Soft, non-tender,  non-distended MUSCULOSKELETAL:  No edema; No deformity  SKIN: Warm and dry NEUROLOGIC:  Alert and oriented x 3 PSYCHIATRIC:  Normal affect   ASSESSMENT:    1. Persistent atrial fibrillation ([place])   2. Nonrheumatic aortic valve stenosis   3. Essential hypertension   4. Ovarian mass  PLAN:    In order of problems listed above:  Maintaining sinus rhythm on dofetilide.  Anticoagulated with apixaban.  No further bleeding problems.  She has declined Paramedic.  When she had lower GI bleeding a year ago she reported melena and hematochezia.  She states that her stools are normal now and she has not had any further problems. I personally reviewed her echo images.  LV and RV function remain normal.  Her aortic valve has a bicuspid appearance.  Her peak transaortic velocity is 4.12 m/s, with peak and mean transvalvular gradients of 68 and 42 mmHg, respectively.  The dimensionless index is 0.19.  These findings are consistent with severe aortic stenosis.  She has had a previous cardiac CTA which demonstrated findings of a bicuspid aortic valve with an aortic valve calcium score of only 408.  She was noted to have severely thickened but only mildly calcified leaflets.  She sized to a 26 mm S3 valve.  We discussed the natural history of aortic stenosis again today.  The patient is reluctant to have any procedures done as she feels well and denies any exertional symptoms.  After shared decision-making conversation, I recommended 76-month follow-up with a same-day echocardiogram as we did today.  She is counseled regarding potential symptoms related to aortic stenosis.  She is counseled specifically about any exertional symptoms of fatigue, dyspnea, or chest discomfort. Her blood pressure is running low.  She otherwise feels very well.  I asked her to stop hydralazine.  She will continue furosemide and metoprolol.  I am going to check a CBC and metabolic panel today to make sure that there is nothing  else going on the account for her lower blood pressure.  She otherwise is well-appearing with a normal heart rate, clear lung fields, no edema, and no other concerning symptoms. Apparently this has taken a benign course and she is followed with serial imaging and follow-up by gynecologic surgery.     Medication Adjustments/Labs and Tests Ordered: Current medicines are reviewed at length with the patient today.  Concerns regarding medicines are outlined above.  Orders Placed This Encounter  Procedures   CBC   Basic metabolic panel   ECHOCARDIOGRAM COMPLETE   No orders of the defined types were placed in this encounter.   Patient Instructions  Medication Instructions:  STOP Hydralazine *If you need a refill on your cardiac medications before your next appointment, please call your pharmacy*   Lab Work: CBC, BMET today If you have labs (blood work) drawn today and your tests are completely normal, you will receive your results only by: [place] (if you have MyChart) OR A paper copy in the mail If you have any lab test that is abnormal or we need to change your treatment, we will call you to review the results.   Testing/Procedures: ECHO (in 6 months, same day appt) Your physician has requested that you have an echocardiogram. Echocardiography is a painless test that uses sound waves to create images of your heart. It provides your doctor with information about the size and shape of your heart and how well your heart's chambers and valves are working. This procedure takes approximately one hour. There are no restrictions for this procedure. Please do NOT wear cologne, perfume, aftershave, or lotions (deodorant is allowed). Please arrive 15 minutes prior to your appointment time.   Follow-Up: At [facility], you and your health needs are our priority.  As part of our continuing mission to provide  you with exceptional heart care, we have created designated Provider  Care Teams.  These Care Teams include your primary Cardiologist (physician) and Advanced Practice Providers (APPs -  Physician Assistants and Nurse Practitioners) who all work together to provide you with the care you need, when you need it.  We recommend signing up for the patient portal called "MyChart".  Sign up information is provided on this After Visit Summary.  MyChart is used to connect with patients for Virtual Visits (Telemedicine).  Patients are able to view lab/test results, encounter notes, upcoming appointments, etc.  Non-urgent messages can be sent to your provider as well.   To learn more about what you can do with MyChart, go to [email or web address].    Your next appointment:   6 month(s)  Provider:   Sherren Mocha, MD        Signed, Sherren Mocha, MD  02/19/2023 1:32 PM    [place]

## 2023-02-19 NOTE — Patient Instructions (Signed)
Medication Instructions:  STOP Hydralazine *If you need a refill on your cardiac medications before your next appointment, please call your pharmacy*   Lab Work: CBC, BMET today If you have labs (blood work) drawn today and your tests are completely normal, you will receive your results only by: [place] (if you have MyChart) OR A paper copy in the mail If you have any lab test that is abnormal or we need to change your treatment, we will call you to review the results.   Testing/Procedures: ECHO (in 6 months, same day appt) Your physician has requested that you have an echocardiogram. Echocardiography is a painless test that uses sound waves to create images of your heart. It provides your doctor with information about the size and shape of your heart and how well your heart's chambers and valves are working. This procedure takes approximately one hour. There are no restrictions for this procedure. Please do NOT wear cologne, perfume, aftershave, or lotions (deodorant is allowed). Please arrive 15 minutes prior to your appointment time.   Follow-Up: At [facility], you and your health needs are our priority.  As part of our continuing mission to provide you with exceptional heart care, we have created designated Provider Care Teams.  These Care Teams include your primary Cardiologist (physician) and Advanced Practice Providers (APPs -  Physician Assistants and Nurse Practitioners) who all work together to provide you with the care you need, when you need it.  We recommend signing up for the patient portal called "MyChart".  Sign up information is provided on this After Visit Summary.  MyChart is used to connect with patients for Virtual Visits (Telemedicine).  Patients are able to view lab/test results, encounter notes, upcoming appointments, etc.  Non-urgent messages can be sent to your provider as well.   To learn more about what you can do with MyChart, go to  [email or web address].    Your next appointment:   6 month(s)  Provider:   Sherren Mocha, MD

## 2023-02-20 LAB — CBC
Hematocrit: 37.8 % (ref 34.0–46.6)
Hemoglobin: 12.4 g/dL (ref 11.1–15.9)
MCH: 28.1 pg (ref 26.6–33.0)
MCHC: 32.8 g/dL (ref 31.5–35.7)
MCV: 86 fL (ref 79–97)
Platelets: 261 10*3/uL (ref 150–450)
RBC: 4.42 x10E6/uL (ref 3.77–5.28)
RDW: 14.4 % (ref 11.7–15.4)
WBC: 6.3 10*3/uL (ref 3.4–10.8)

## 2023-02-20 LAB — BASIC METABOLIC PANEL
BUN/Creatinine Ratio: 17 (ref 12–28)
BUN: 26 mg/dL (ref 8–27)
CO2: 25 mmol/L (ref 20–29)
Calcium: 10 mg/dL (ref 8.7–10.3)
Chloride: 105 mmol/L (ref 96–106)
Creatinine, Ser: 1.51 mg/dL — ABNORMAL HIGH (ref 0.57–1.00)
Glucose: 94 mg/dL (ref 70–99)
Potassium: 4.9 mmol/L (ref 3.5–5.2)
Sodium: 144 mmol/L (ref 134–144)
eGFR: 37 mL/min/{1.73_m2} — ABNORMAL LOW (ref >59)

## 2023-02-22 ENCOUNTER — Other Ambulatory Visit: Payer: Self-pay | Admitting: Family Medicine

## 2023-02-22 DIAGNOSIS — I5022 Chronic systolic (congestive) heart failure: Secondary | ICD-10-CM

## 2023-02-22 NOTE — Telephone Encounter (Signed)
Requested medication (s) are due for refill today: yes  Requested medication (s) are on the active medication list: yes  Last refill:  07/31/22 #90 1 refills  Future visit scheduled: no last OV 6 months ago   Notes to clinic:  protocol failed. Last labs 10/19/21. Do you want to refill Rx?     Requested Prescriptions  Pending Prescriptions Disp Refills   atorvastatin (LIPITOR) 20 MG tablet [Pharmacy Med Name: Atorvastatin Calcium 20 MG Oral Tablet] 90 tablet 0    Sig: Take 1 tablet by mouth once daily     Cardiovascular:  Antilipid - Statins Failed - 02/22/2023  1:55 PM      Failed - Lipid Panel in normal range within the last 12 months    Cholesterol, Total  Date Value Ref Range Status  10/19/2021 144 100 - 199 mg/dL Final   LDL Cholesterol (Calc)  Date Value Ref Range Status  09/26/2017 95 mg/dL (calc) Final    Comment:    Reference range: <100 . Desirable range <100 mg/dL for primary prevention;   <70 mg/dL for patients with CHD or diabetic patients  with > or = 2 CHD risk factors. Marland Kitchen LDL-C is now calculated using the Martin-Hopkins  calculation, which is a validated novel method providing  better accuracy than the Friedewald equation in the  estimation of LDL-C.  Cresenciano Genre et al. Annamaria Helling. [id number]): 2061-2068  (http://education.[employer].com/faq/FAQ164)    LDL Chol Calc (NIH)  Date Value Ref Range Status  10/19/2021 81 0 - 99 mg/dL Final   HDL  Date Value Ref Range Status  10/19/2021 40 >39 mg/dL Final   Triglycerides  Date Value Ref Range Status  10/19/2021 126 0 - 149 mg/dL Final         Passed - Patient is not pregnant      Passed - Valid encounter within last 12 months    Recent Outpatient Visits           3 months ago Encounter for [employer] annual wellness exam   [place], Charlane Ferretti, MD   6 months ago Atrial fibrillation, unspecified type [facility])   [place]  Charlott Rakes, MD   10 months ago Hospital discharge follow-up   [facility] [place], Vernia Buff, NP   1 year ago Encounter for [employer] annual wellness exam   [place], Enobong, MD   1 year ago Allergic conjunctivitis of both eyes   [place], MD       Future Appointments             In 6 months Sherren Mocha, MD [place] [facility], [place]

## 2023-04-08 ENCOUNTER — Other Ambulatory Visit: Payer: Self-pay | Admitting: Family Medicine

## 2023-04-08 DIAGNOSIS — I5022 Chronic systolic (congestive) heart failure: Secondary | ICD-10-CM

## 2023-04-08 DIAGNOSIS — N1831 Chronic kidney disease, stage 3a: Secondary | ICD-10-CM

## 2023-04-08 DIAGNOSIS — I10 Essential (primary) hypertension: Secondary | ICD-10-CM

## 2023-04-11 DIAGNOSIS — S91002A Unspecified open wound, left ankle, initial encounter: Secondary | ICD-10-CM | POA: Diagnosis not present

## 2023-04-11 DIAGNOSIS — S93402A Sprain of unspecified ligament of left ankle, initial encounter: Secondary | ICD-10-CM | POA: Diagnosis not present

## 2023-05-10 ENCOUNTER — Other Ambulatory Visit: Payer: Self-pay | Admitting: Family Medicine

## 2023-05-10 ENCOUNTER — Other Ambulatory Visit: Payer: Self-pay | Admitting: Physician Assistant

## 2023-05-10 DIAGNOSIS — I5022 Chronic systolic (congestive) heart failure: Secondary | ICD-10-CM

## 2023-05-10 DIAGNOSIS — N1831 Chronic kidney disease, stage 3a: Secondary | ICD-10-CM

## 2023-05-10 DIAGNOSIS — I10 Essential (primary) hypertension: Secondary | ICD-10-CM

## 2023-05-11 ENCOUNTER — Telehealth: Payer: Self-pay | Admitting: Family Medicine

## 2023-05-11 DIAGNOSIS — I5022 Chronic systolic (congestive) heart failure: Secondary | ICD-10-CM

## 2023-05-11 DIAGNOSIS — N1831 Chronic kidney disease, stage 3a: Secondary | ICD-10-CM

## 2023-05-11 DIAGNOSIS — I10 Essential (primary) hypertension: Secondary | ICD-10-CM

## 2023-05-11 NOTE — Telephone Encounter (Signed)
Copied from CRM [id number]. Topic: General - Inquiry >> May 11, 2023  8:07 AM Tracy James wrote: Patient called to see why she needs to be seen every 6 months for a medication refill and would like an explanation from provider or nurse.. Patient states that she cannot afford to come every 6 months.

## 2023-05-14 MED ORDER — ATORVASTATIN CALCIUM 20 MG PO TABS: 20.0000 mg | ORAL_TABLET | Freq: Every day | ORAL | 0 refills | Status: DC

## 2023-05-14 MED ORDER — FUROSEMIDE 40 MG PO TABS: 40.0000 mg | ORAL_TABLET | Freq: Every day | ORAL | 0 refills | Status: DC

## 2023-05-14 NOTE — Telephone Encounter (Signed)
She needs visits every 6 months so she can have blood work due to the fact the medication she takes can have side effects on the kidneys and liver and these need to be monitored.  Since she also has cardiology visits she can also have them refill her medications when she goes for their appointment so she does not have too many appointments to keep up with.  I have sent a refill for furosemide and atorvastatin as those are the onces she is low as the others were done by cardiology.

## 2023-05-14 NOTE — Telephone Encounter (Signed)
Pt was called and she states that she does not have the money to pay co-pays every 6 months, she wants to know why she has to come in every 6 months, she also states that she needs refills on her medication.

## 2023-05-14 NOTE — Telephone Encounter (Signed)
Patient called back this morning to get an update on her medication refill. Patient did not get a callback last week with an explanation as to why she needed to be seen every 6 months for a medication refill. Please advise.

## 2023-05-14 NOTE — Addendum Note (Signed)
Addended by: Hoy Register on: 05/14/2023 02:30 PM   Modules accepted: Orders

## 2023-06-14 ENCOUNTER — Ambulatory Visit (HOSPITAL_COMMUNITY)
Admission: RE | Admit: 2023-06-14 | Discharge: 2023-06-14 | Disposition: A | Discharge status: Home / Self Care | Payer: Medicare PPO | Source: Ambulatory Visit | Attending: Physician Assistant | Admitting: Physician Assistant

## 2023-06-14 ENCOUNTER — Encounter (HOSPITAL_COMMUNITY): Payer: Self-pay | Admitting: Physician Assistant

## 2023-06-14 VITALS — BP 122/66 | HR 66 | Ht 66.0 in | Wt 180.4 lb

## 2023-06-14 DIAGNOSIS — Z7901 Long term (current) use of anticoagulants: Secondary | ICD-10-CM | POA: Insufficient documentation

## 2023-06-14 DIAGNOSIS — I509 Heart failure, unspecified: Secondary | ICD-10-CM | POA: Diagnosis not present

## 2023-06-14 DIAGNOSIS — I429 Cardiomyopathy, unspecified: Secondary | ICD-10-CM | POA: Insufficient documentation

## 2023-06-14 DIAGNOSIS — D6869 Other thrombophilia: Secondary | ICD-10-CM | POA: Diagnosis not present

## 2023-06-14 DIAGNOSIS — Z5181 Encounter for therapeutic drug level monitoring: Secondary | ICD-10-CM

## 2023-06-14 DIAGNOSIS — N1832 Chronic kidney disease, stage 3b: Secondary | ICD-10-CM | POA: Diagnosis not present

## 2023-06-14 DIAGNOSIS — Z79899 Other long term (current) drug therapy: Secondary | ICD-10-CM | POA: Diagnosis not present

## 2023-06-14 DIAGNOSIS — I4819 Other persistent atrial fibrillation: Secondary | ICD-10-CM | POA: Diagnosis not present

## 2023-06-14 DIAGNOSIS — Q231 Congenital insufficiency of aortic valve: Secondary | ICD-10-CM | POA: Insufficient documentation

## 2023-06-14 DIAGNOSIS — I130 Hypertensive heart and chronic kidney disease with heart failure and stage 1 through stage 4 chronic kidney disease, or unspecified chronic kidney disease: Secondary | ICD-10-CM | POA: Insufficient documentation

## 2023-06-14 DIAGNOSIS — F17210 Nicotine dependence, cigarettes, uncomplicated: Secondary | ICD-10-CM | POA: Diagnosis not present

## 2023-06-14 DIAGNOSIS — E1122 Type 2 diabetes mellitus with diabetic chronic kidney disease: Secondary | ICD-10-CM | POA: Diagnosis not present

## 2023-06-14 LAB — BASIC METABOLIC PANEL
Anion gap: 8 (ref 5–15)
BUN: 16 mg/dL (ref 8–23)
CO2: 25 mmol/L (ref 22–32)
Calcium: 9 mg/dL (ref 8.9–10.3)
Chloride: 103 mmol/L (ref 98–111)
Creatinine, Ser: 1.42 mg/dL — ABNORMAL HIGH (ref 0.44–1.00)
GFR, Estimated: 40 mL/min — ABNORMAL LOW (ref >60)
Glucose, Bld: 107 mg/dL — ABNORMAL HIGH (ref 70–99)
Potassium: 3.8 mmol/L (ref 3.5–5.1)
Sodium: 136 mmol/L (ref 135–145)

## 2023-06-14 LAB — MAGNESIUM: Magnesium: 1.9 mg/dL (ref 1.7–2.4)

## 2023-06-14 NOTE — Progress Notes (Signed)
Primary Care Physician: Hoy Register, MD Primary Cardiologist: Dr Excell Seltzer Primary Electrophysiologist: Dr Johney Frame (previously) Referring Physician: Tereso Newcomer PA-C   Tracy James is a 72 y.o. female with a history of persistent atrial fibrillation, tachycardia induced cardiomyopathy, AS, HTN, and tobacco abuse who presents for follow up in the [facility]. She was admitted in 11/2018 with AF with RVR.  EF was 40-45 on Echo and TEE demonstrated thrombus in the R and L atria and possible thrombus adjacent to the heart.  After a month of anticoagulation, she underwent repeat TEE-DCCV 01/20/2019.  Unfortunately, the TEE continued to show residual clot in the LA.  Therefore, DCCV was not performed.  She was seen in follow up on 02/2019 and her HR was uncontrolled.  Unfortunately, repeat DCCV was not arranged due to COVID-19 restrictions (elective procedures on hold).  She was placed on Digoxin for rate control. She was admitted 07/15/19-07/18/19 for dofetilide loading. She is on Eliquis for a CHADS2VASC score of 4.   Patient was hospitalized 03/2022 with lower GI bleed, was not transfused. GI performed a colonoscopy and bleeding felt to be diverticular.   On follow up today, patient reports that she has done well since her last visit. She has not had any interim symptoms of afib. No recent bleeding issues on anticoagulation. She remains very active doing water aerobics and walking.   Today, she denies symptoms of palpitations, chest pain, shortness of breath, orthopnea, PND, lower extremity edema, presyncope, syncope, snoring, daytime somnolence, or neurologic sequela. The patient is tolerating medications without difficulties and is otherwise without complaint today.    Atrial Fibrillation Risk Factors:  she does have symptoms of sleep apnea. she does not have a history of rheumatic fever. she does not have a history of alcohol use. The patient does not have a  history of early familial atrial fibrillation or other arrhythmias.   Atrial Fibrillation Management history:  Previous antiarrhythmic drugs: dofetilide Previous cardioversions: none Previous ablations: none Anticoagulation history: Eliquis   Past Medical History:  Diagnosis Date   Aortic stenosis    Echo 06/2019: mod AS, mean 28 mmHg, peak velocity 3.6 m/s // Echo 9/21: EF 60-65 no RWMA, moderate LVH, normal RVSF, trivial MR, trivial AI, moderate aortic stenosis (mean gradient 36 mmHg; V-max 3.64 m/s; DI 0.18) [reviewed with Dr. Excell Seltzer - mod AS; rpt echo 1 yr]     CHF (congestive heart failure) (HCC)    Chronic systolic and diastolic   CKD (chronic kidney disease)    CKD IIIb   Hypertension    Nonischemic cardiomyopathy    Tachy induced CM // TEE 12/2018: EF 30-35 // Echo 06/2019: EF 55-60 // Cor CTA 8/18: Ca score 0; no CAD   Persistent atrial fibrillation    Admx 12/19 w AF w RVR // R+L atrial thrombus on TEE - DCCV canceled; repeat TEE in 01/2019 w some residual clot // Dofetilide Rx started 07/2019 >> NSR restored without DCCV // CHADS-VASc 4 (6 if include LAA clot) >> Apixaban     ROS- All systems are reviewed and negative except as per the HPI above.  Physical Exam: Vitals:   06/14/23 0813  BP: 122/66  Pulse: 66  Weight: 81.8 kg  Height: 5\' 6"  (1.676 m)    GEN: Well nourished, well developed in no acute distress NECK: No JVD; No carotid bruits CARDIAC: Regular rate and rhythm, no rubs, gallops, 2/6 systolic murmur  RESPIRATORY:  Clear to auscultation without rales,  wheezing or rhonchi  ABDOMEN: Soft, non-tender, non-distended EXTREMITIES:  No edema; No deformity    Wt Readings from Last 3 Encounters:  06/14/23 81.8 kg  02/19/23 80.6 kg  12/07/22 81.2 kg    EKG today demonstrates  SR    Echo 02/19/23 demonstrated   1. Left ventricular ejection fraction, by estimation, is 60 to 65%. Left  ventricular ejection fraction by 3D volume is 59 %. The left ventricle  has  normal function. The left ventricle has no regional wall motion  abnormalities. There is mild left ventricular hypertrophy. Left ventricular diastolic parameters are consistent with Grade II diastolic dysfunction (pseudonormalization). The average left ventricular global longitudinal strain is -18.9 %. The global longitudinal strain is normal, however demonstrates a pattern of apical sparing.   2. Right ventricular systolic function is normal. The right ventricular  size is normal. There is mildly elevated pulmonary artery systolic  pressure. The estimated right ventricular systolic pressure is 38.8 mmHg.   3. Left atrial size was severely dilated.   4. The mitral valve is grossly normal. Trivial mitral valve  regurgitation. No evidence of mitral stenosis.   5. Possible bicuspid aortic valve with fusion of right and left coronary  cusps and calcification of posterior, noncoronary cusp. The aortic valve  is abnormal. There is moderate calcification of the aortic valve. Aortic  valve regurgitation is trivial. Severe aortic valve stenosis. Aortic valve area, by VTI measures 0.61 cm. Aortic valve mean gradient measures 42.0 mmHg. Aortic valve Vmax measures 4.08 m/s.   6. The inferior vena cava is normal in size with greater than 50%  respiratory variability, suggesting right atrial pressure of 3 mmHg.   Epic records are reviewed at length today  CHA2DS2-VASc Score = 4  The patient's score is based upon: CHF History: 1 HTN History: 1 Diabetes History: 0 Stroke History: 0 Vascular Disease History: 0 Age Score: 1 Gender Score: 1       ASSESSMENT AND PLAN: Persistent Atrial Fibrillation (ICD10:  I48.19) The patient's CHA2DS2-VASc score is 4, indicating a 4.8% annual risk of stroke.   Patient appears to be maintaining SR. Continue dofetilide 250 mcg BID. QT stable Check bmet/mag today Continue metoprolol 25 mg BID Continue Eliquis 5 mg BID  Secondary Hypercoagulable State (ICD10:   D68.69) The patient is at significant risk for stroke/thromboembolism based upon her CHA2DS2-VASc Score of 4.  Continue Apixaban (Eliquis). H/o diverticular bleeding. We discussed Watchman previously, patient opted not to proceed at this time.   Cardiomyopathy  EF recovered Fluid status appear stable today  HTN Stable on current regimen  Aortic stenosis  Bicuspid AV, severe stenosis  Followed by Dr Excell Seltzer    Follow up in the AF clinic in 6 months for dofetilide monitoring.    Jorja Loa PA-C [facility] [address] [place], [place] [address] [phone] 06/14/2023 8:38 AM

## 2023-08-08 ENCOUNTER — Encounter: Payer: Self-pay | Admitting: Nurse Practitioner

## 2023-08-08 ENCOUNTER — Ambulatory Visit: Payer: Medicare PPO | Admitting: Nurse Practitioner

## 2023-08-08 VITALS — BP 138/72 | HR 76 | Temp 97.6°F | Ht 66.05 in | Wt 182.6 lb

## 2023-08-08 DIAGNOSIS — Z7689 Persons encountering health services in other specified circumstances: Secondary | ICD-10-CM | POA: Diagnosis not present

## 2023-08-08 DIAGNOSIS — I4819 Other persistent atrial fibrillation: Secondary | ICD-10-CM | POA: Diagnosis not present

## 2023-08-08 DIAGNOSIS — I10 Essential (primary) hypertension: Secondary | ICD-10-CM

## 2023-08-08 DIAGNOSIS — D6869 Other thrombophilia: Secondary | ICD-10-CM

## 2023-08-08 DIAGNOSIS — R7303 Prediabetes: Secondary | ICD-10-CM

## 2023-08-08 DIAGNOSIS — N1832 Chronic kidney disease, stage 3b: Secondary | ICD-10-CM

## 2023-08-08 DIAGNOSIS — I350 Nonrheumatic aortic (valve) stenosis: Secondary | ICD-10-CM

## 2023-08-08 DIAGNOSIS — I5022 Chronic systolic (congestive) heart failure: Secondary | ICD-10-CM | POA: Diagnosis not present

## 2023-08-08 DIAGNOSIS — Z0000 Encounter for general adult medical examination without abnormal findings: Secondary | ICD-10-CM | POA: Insufficient documentation

## 2023-08-08 NOTE — Assessment & Plan Note (Signed)
Did do a brief review of EMR

## 2023-08-08 NOTE — Assessment & Plan Note (Signed)
Murmur appreciated.

## 2023-08-08 NOTE — Assessment & Plan Note (Signed)
Patient currently on apixaban 5 mg twice daily continue

## 2023-08-08 NOTE — Assessment & Plan Note (Signed)
Last A1c of 6.0%

## 2023-08-08 NOTE — Assessment & Plan Note (Signed)
Did review two most recent metabolic panel stable

## 2023-08-08 NOTE — Patient Instructions (Addendum)
Nice to see you today I want to see you in 3 months for your physical and labs, sooner if you need me

## 2023-08-08 NOTE — Progress Notes (Signed)
New Patient Office Visit  Subjective    Patient ID: Tracy James, female    DOB: 1951-03-27  Age: 72 y.o. MRN: [id number]  CC:  Chief Complaint  Patient presents with   Establish Care    HPI Tracy James presents to establish care   HTN: states that she does check her blood pressure weekly.  She also check it if her blood pressure is abnormal.  Patient currently maintained on metoprolol  Afib: tikosyn, furesomide, metoprolol and apixaban.  Dr cooper every 6 months patient is also followed by A-fib clinic  CKD: Stable  HLD: atorvastatin and omegea 3.  Tolerates medications well  Prediabetes: Last A1c of 6.0  Tdap: unsure Flu completed this season  Covid utd Shingles: Refused, informed to get at local pharmacy if she would like Pna: utd  Colonoscopy: 03/30/2022, repeat 2030 Mammogram 02/14/2023 pap smear: aged out.  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  DEXA: needs to update [place]    Outpatient Encounter Medications as of 08/08/2023  Medication Sig   acetaminophen (TYLENOL) 500 MG tablet Take 500 mg by mouth every 6 (six) hours as needed (for pain.).   apixaban (ELIQUIS) 5 MG TABS tablet Take 1 tablet by mouth twice daily   atorvastatin (LIPITOR) 20 MG tablet Take 1 tablet (20 mg total) by mouth daily.   calcium-vitamin D (OSCAL WITH D) 500-200 MG-UNIT TABS tablet Take 1 tablet by mouth daily.    dofetilide (TIKOSYN) 250 MCG capsule Take 1 capsule by mouth twice daily   furosemide (LASIX) 40 MG tablet Take 1 tablet (40 mg total) by mouth daily.   hydroxypropyl methylcellulose / hypromellose (ISOPTO TEARS / GONIOVISC) 2.5 % ophthalmic solution 1 drop as needed for dry eyes.   loratadine (CLARITIN) 10 MG tablet Take 10 mg by mouth daily as needed for allergies.   mometasone (ELOCON) 0.1 % ointment Apply 1  application  topically daily as needed (for skin rash).   nitroGLYCERIN (NITROSTAT) 0.4 MG SL tablet Place 1 tablet (0.4 mg total) under the tongue every 5 (five) minutes as needed for chest pain.   Olopatadine HCl 0.2 % SOLN Apply 1 drop to eye daily. One drop to affected eye once daily   Omega-3 1000 MG CAPS Take 1,000 mg by mouth daily.    VITAMIN D PO Take 1 tablet by mouth daily.   metoprolol tartrate (LOPRESSOR) 25 MG tablet Take 1 tablet (25 mg total) by mouth 2 (two) times daily.   No facility-administered encounter medications on file as of 08/08/2023.    Past Medical History:  Diagnosis Date   Aortic stenosis    Echo 06/2019: mod AS, mean 28 mmHg, peak velocity 3.6 m/s // Echo 9/21: EF 60-65 no RWMA, moderate LVH, normal RVSF, trivial MR, trivial AI, moderate aortic stenosis (mean gradient 36 mmHg; V-max 3.64 m/s; DI 0.18) [reviewed with Dr. Excell Seltzer - mod AS; rpt echo 1 yr]     CHF (congestive heart failure) (HCC)    Chronic systolic and diastolic   CKD (chronic kidney disease)    CKD IIIb   Hypertension    Nonischemic cardiomyopathy    Tachy induced CM // TEE 12/2018: EF 30-35 // Echo 06/2019: EF 55-60 // Cor CTA 8/18: Ca score 0; no CAD   Persistent atrial fibrillation    Admx 12/19 w AF w RVR //  R+L atrial thrombus on TEE - DCCV canceled; repeat TEE in 01/2019 w some residual clot // Dofetilide Rx started 07/2019 >> NSR restored without DCCV // CHADS-VASc 4 (6 if include LAA clot) >> Apixaban     Past Surgical History:  Procedure Laterality Date   COLONOSCOPY WITH PROPOFOL N/A 03/30/2022   Procedure: COLONOSCOPY WITH PROPOFOL;  Surgeon: Meryl Dare, MD;  Location: [facility] ENDOSCOPY;  Service: Gastroenterology;  Laterality: N/A;   POLYPECTOMY  03/30/2022   Procedure: POLYPECTOMY;  Surgeon: Meryl Dare, MD;  Location: [facility] ENDOSCOPY;  Service: Gastroenterology;;   TEE WITHOUT CARDIOVERSION N/A 12/06/2018   Procedure: TRANSESOPHAGEAL ECHOCARDIOGRAM (TEE);  Surgeon: Lewayne Bunting,  MD;  Location: [facility] ENDOSCOPY;  Service: Cardiovascular;  Laterality: N/A;   TEE WITHOUT CARDIOVERSION N/A 01/20/2019   Procedure: TRANSESOPHAGEAL ECHOCARDIOGRAM (TEE);  Surgeon: Parke Poisson, MD;  Location: [facility] ENDOSCOPY;  Service: Cardiovascular;  Laterality: N/A;   TONSILLECTOMY      Family History  Problem Relation Age of Onset   Arthritis Mother    Cancer Mother        colon cancer   Diabetes Mother    Hypertension Mother    Heart murmur Mother    Early death Brother    Heart murmur Brother    Lung cancer Brother    Arthritis Maternal Grandmother    Heart disease Maternal Grandmother        massive heart attack   Heart disease Paternal Grandmother    Asthma Brother    Diabetes Brother    Hypertension Brother    Heart murmur Brother    Heart disease Father        [email or web address]    Social History   Socioeconomic History   Marital status: Divorced    Spouse name: Not on file   Number of children: 2   Years of education: Not on file   Highest education level: Not on file  Occupational History   Not on file  Tobacco Use   Smoking status: Former    Current packs/day: 0.00    Types: Cigarettes    Quit date: 12/05/2011    Years since quitting: 11.6   Smokeless tobacco: Never   Tobacco comments:     Former smoker 06/15/22  Vaping Use   Vaping status: Never Used  Substance and Sexual Activity   Alcohol use: Not Currently    Alcohol/week: 1.0 standard drink of alcohol    Types: 1 Glasses of wine per week    Comment: stop drinking occ 2 months 06/15/22   Drug use: No   Sexual activity: Not Currently    Birth control/protection: None  Other Topics Concern   Not on file  Social History Narrative         Tracy James (56)   Tracy James (43)      3 grandchildren all together    Social Determinants of Health   Financial Resource Strain: Low Risk  (10/31/2022)   Overall Financial Resource Strain (CARDIA)    Difficulty of Paying Living Expenses: Not very hard  Food  Insecurity: No Food Insecurity (10/31/2022)   Hunger Vital Sign    Worried About Running Out of Food in the Last Year: Never true    Ran Out of Food in the Last Year: Never true  Transportation Needs: No Transportation Needs (10/31/2022)   PRAPARE - Administrator, Civil Service (Medical): No    Lack of Transportation (Non-Medical): No  Physical Activity: Insufficiently Active (10/31/2022)  Exercise Vital Sign    Days of Exercise per Week: 4 days    Minutes of Exercise per Session: 30 min  Stress: No Stress Concern Present (10/31/2022)   [employer] of Occupational Health - Occupational Stress Questionnaire    Feeling of Stress : Not at all  Social Connections: Moderately Integrated (10/31/2022)   Social Connection and Isolation Panel [NHANES]    Frequency of Communication with Friends and Family: More than three times a week    Frequency of Social Gatherings with Friends and Family: Once a week    Attends Religious Services: More than 4 times per year    Active Member of [employer] or Organizations: Yes    Attends Banker Meetings: 1 to 4 times per year    Marital Status: Divorced  Intimate Partner Violence: Not At Risk (10/31/2022)   Humiliation, Afraid, Rape, and Kick questionnaire    Fear of Current or Ex-Partner: No    Emotionally Abused: No    Physically Abused: No    Sexually Abused: No    Review of Systems  Constitutional:  Negative for chills and fever.  Respiratory:  Negative for shortness of breath.   Cardiovascular:  Negative for chest pain.  Gastrointestinal:  Negative for abdominal pain, blood in stool, constipation, diarrhea, nausea and vomiting.       BM daily   Genitourinary:  Negative for dysuria and hematuria.  Neurological:  Negative for headaches.  Psychiatric/Behavioral:  Negative for hallucinations and suicidal ideas.         Objective    BP 138/72   Pulse 76   Temp 97.6 F (36.4 C) (Temporal)   Ht 5' 6.05" (1.678 m)    Wt 182 lb 9.6 oz (82.8 kg)   SpO2 98%   BMI 29.43 kg/m   Physical Exam Vitals and nursing note reviewed.  Constitutional:      Appearance: Normal appearance.  HENT:     Right Ear: Tympanic membrane, ear canal and external ear normal.     Left Ear: Tympanic membrane, ear canal and external ear normal.     Mouth/Throat:     Mouth: Mucous membranes are moist.     Pharynx: Oropharynx is clear.  Eyes:     Extraocular Movements: Extraocular movements intact.     Pupils: Pupils are equal, round, and reactive to light.  Cardiovascular:     Rate and Rhythm: Normal rate and regular rhythm.     Pulses: Normal pulses.     Heart sounds: Murmur heard.  Pulmonary:     Effort: Pulmonary effort is normal.     Breath sounds: Normal breath sounds.  Musculoskeletal:     Right lower leg: No edema.     Left lower leg: No edema.  Lymphadenopathy:     Cervical: No cervical adenopathy.  Skin:    General: Skin is warm.  Neurological:     General: No focal deficit present.     Mental Status: She is alert.     Deep Tendon Reflexes:     Reflex Scores:      Bicep reflexes are 2+ on the right side and 2+ on the left side.      Patellar reflexes are 2+ on the right side and 2+ on the left side.    Comments: Bilateral upper and lower extremity strength 5/5  Psychiatric:        Mood and Affect: Mood normal.        Behavior: Behavior normal.  Thought Content: Thought content normal.        Judgment: Judgment normal.         Assessment & Plan:   Problem List Items Addressed This Visit       Cardiovascular and Mediastinum   Essential hypertension    Patient currently maintained on metoprolol and furosemide.  Blood pressure within normal limits.  Patient does not have any lightheadedness or dizziness continue medication as prescribed      Aortic stenosis    Murmur appreciated.      Persistent atrial fibrillation (HCC)    Patient currently maintained on Tikosyn, furosemide,  apixaban, metoprolol.  She is followed by Dr. Tonny Bollman cardiologist and A-fib clinic.  Continue medication as prescribed follow-up with specialist as recommended      Chronic systolic CHF (congestive heart failure) (HCC)    Follow-up with cardiology patient on metoprolol and furosemide.  Continue      Hypercoagulable state due to persistent atrial fibrillation [facility])    Patient currently on apixaban 5 mg twice daily continue        Genitourinary   Chronic kidney disease, stage 3b (HCC)    Did review two most recent metabolic panel stable        Other   Prediabetes    Last A1c of 6.0%      Establishing care with new doctor, encounter for - Primary    Did do a brief review of EMR       Return in about 3 months (around 11/07/2023) for CPE and Labs.   Audria Nine, NP

## 2023-08-08 NOTE — Assessment & Plan Note (Signed)
Follow-up with cardiology patient on metoprolol and furosemide.  Continue

## 2023-08-08 NOTE — Assessment & Plan Note (Signed)
Patient currently maintained on Tikosyn, furosemide, apixaban, metoprolol.  She is followed by Dr. Tonny Bollman cardiologist and A-fib clinic.  Continue medication as prescribed follow-up with specialist as recommended

## 2023-08-08 NOTE — Assessment & Plan Note (Signed)
Patient currently maintained on metoprolol and furosemide.  Blood pressure within normal limits.  Patient does not have any lightheadedness or dizziness continue medication as prescribed

## 2023-08-09 ENCOUNTER — Other Ambulatory Visit: Payer: Self-pay | Admitting: Family Medicine

## 2023-08-09 DIAGNOSIS — I10 Essential (primary) hypertension: Secondary | ICD-10-CM

## 2023-08-09 DIAGNOSIS — I5022 Chronic systolic (congestive) heart failure: Secondary | ICD-10-CM

## 2023-08-09 DIAGNOSIS — N1831 Chronic kidney disease, stage 3a: Secondary | ICD-10-CM

## 2023-08-15 ENCOUNTER — Telehealth: Payer: Self-pay | Admitting: Nurse Practitioner

## 2023-08-15 DIAGNOSIS — N1831 Chronic kidney disease, stage 3a: Secondary | ICD-10-CM

## 2023-08-15 DIAGNOSIS — I10 Essential (primary) hypertension: Secondary | ICD-10-CM

## 2023-08-15 DIAGNOSIS — I5022 Chronic systolic (congestive) heart failure: Secondary | ICD-10-CM

## 2023-08-15 MED ORDER — FUROSEMIDE 40 MG PO TABS: 40.0000 mg | ORAL_TABLET | Freq: Every day | ORAL | 0 refills | Status: DC

## 2023-08-15 NOTE — Addendum Note (Signed)
Addended by: Eden Emms on: 08/15/2023 04:17 PM   Modules accepted: Orders

## 2023-08-15 NOTE — Telephone Encounter (Signed)
LAST APPOINTMENT DATE: 08/08/2023   NEXT APPOINTMENT DATE: 11/07/2023   Lasix 40mg    LAST REFILL: 05/14/2023  QTY: #[id number]

## 2023-08-15 NOTE — Telephone Encounter (Signed)
Refill provided

## 2023-08-15 NOTE — Telephone Encounter (Signed)
Prescription Request  08/15/2023  LOV: 08/08/2023  What is the name of the medication or equipment? furosemide (LASIX) 40 MG tablet   Have you contacted your pharmacy to request a refill? No   Which pharmacy would you like this sent to?  [facility] [address], [place] - [address] [place] [address] Phone: [phone] Fax: [phone]   Patient notified that their request is being sent to the clinical staff for review and that they should receive a response within 2 business days.   Please advise at Mobile [phone] (mobile)

## 2023-08-16 ENCOUNTER — Other Ambulatory Visit: Payer: Self-pay | Admitting: Family Medicine

## 2023-08-16 DIAGNOSIS — I5022 Chronic systolic (congestive) heart failure: Secondary | ICD-10-CM

## 2023-08-16 DIAGNOSIS — I10 Essential (primary) hypertension: Secondary | ICD-10-CM

## 2023-08-16 DIAGNOSIS — N1831 Chronic kidney disease, stage 3a: Secondary | ICD-10-CM

## 2023-08-16 DIAGNOSIS — L308 Other specified dermatitis: Secondary | ICD-10-CM

## 2023-08-20 DIAGNOSIS — L309 Dermatitis, unspecified: Secondary | ICD-10-CM | POA: Diagnosis not present

## 2023-08-20 DIAGNOSIS — R7303 Prediabetes: Secondary | ICD-10-CM | POA: Diagnosis not present

## 2023-08-20 DIAGNOSIS — R011 Cardiac murmur, unspecified: Secondary | ICD-10-CM | POA: Diagnosis not present

## 2023-08-20 DIAGNOSIS — R32 Unspecified urinary incontinence: Secondary | ICD-10-CM | POA: Diagnosis not present

## 2023-08-20 DIAGNOSIS — I509 Heart failure, unspecified: Secondary | ICD-10-CM | POA: Diagnosis not present

## 2023-08-20 DIAGNOSIS — D6869 Other thrombophilia: Secondary | ICD-10-CM | POA: Diagnosis not present

## 2023-08-20 DIAGNOSIS — I4891 Unspecified atrial fibrillation: Secondary | ICD-10-CM | POA: Diagnosis not present

## 2023-08-20 DIAGNOSIS — I130 Hypertensive heart and chronic kidney disease with heart failure and stage 1 through stage 4 chronic kidney disease, or unspecified chronic kidney disease: Secondary | ICD-10-CM | POA: Diagnosis not present

## 2023-08-20 DIAGNOSIS — E785 Hyperlipidemia, unspecified: Secondary | ICD-10-CM | POA: Diagnosis not present

## 2023-08-27 ENCOUNTER — Ambulatory Visit (HOSPITAL_COMMUNITY): Payer: Medicare PPO | Attending: Cardiovascular Disease | Admitting: Cardiovascular Disease

## 2023-08-27 ENCOUNTER — Ambulatory Visit (HOSPITAL_BASED_OUTPATIENT_CLINIC_OR_DEPARTMENT_OTHER): Payer: Medicare PPO

## 2023-08-27 ENCOUNTER — Encounter: Payer: Self-pay | Admitting: Cardiovascular Disease

## 2023-08-27 VITALS — BP 152/72 | HR 60 | Ht 66.5 in | Wt 179.0 lb

## 2023-08-27 DIAGNOSIS — N838 Other noninflammatory disorders of ovary, fallopian tube and broad ligament: Secondary | ICD-10-CM | POA: Insufficient documentation

## 2023-08-27 DIAGNOSIS — D6869 Other thrombophilia: Secondary | ICD-10-CM | POA: Insufficient documentation

## 2023-08-27 DIAGNOSIS — I10 Essential (primary) hypertension: Secondary | ICD-10-CM

## 2023-08-27 DIAGNOSIS — I5032 Chronic diastolic (congestive) heart failure: Secondary | ICD-10-CM | POA: Diagnosis not present

## 2023-08-27 DIAGNOSIS — I350 Nonrheumatic aortic (valve) stenosis: Secondary | ICD-10-CM | POA: Insufficient documentation

## 2023-08-27 DIAGNOSIS — I4819 Other persistent atrial fibrillation: Secondary | ICD-10-CM | POA: Diagnosis not present

## 2023-08-27 LAB — ECHOCARDIOGRAM COMPLETE
AR max vel: 0.54 cm2
AV Area VTI: 0.65 cm2
AV Area mean vel: 0.59 cm2
AV Mean grad: 46 mmHg
AV Peak grad: 92.2 mmHg
Ao pk vel: 4.8 m/s
Area-P 1/2: 2.63 cm2
P 1/2 time: 396 msec
S' Lateral: 2.3 cm

## 2023-08-27 NOTE — Patient Instructions (Addendum)
Medication Instructions:  Your physician recommends that you continue on your current medications as directed. Please refer to the Current Medication list given to you today.  *If you need a refill on your cardiac medications before your next appointment, please call your pharmacy*  Lab Work: If you have labs (blood work) drawn today and your tests are completely normal, you will receive your results only by: MyChart Message (if you have MyChart) OR A paper copy in the mail If you have any lab test that is abnormal or we need to change your treatment, we will call you to review the results.  Testing/Procedures: Your physician has requested that you have an echocardiogram in 6 months same day as office visit. Echocardiography is a painless test that uses sound waves to create images of your heart. It provides your doctor with information about the size and shape of your heart and how well your heart's chambers and valves are working. This procedure takes approximately one hour. There are no restrictions for this procedure. Please do NOT wear cologne, perfume, aftershave, or lotions (deodorant is allowed). Please arrive 15 minutes prior to your appointment time.   Follow-Up: At [facility], you and your health needs are our priority.  As part of our continuing mission to provide you with exceptional heart care, we have created designated Provider Care Teams.  These Care Teams include your primary Cardiologist (physician) and Advanced Practice Providers (APPs -  Physician Assistants and Nurse Practitioners) who all work together to provide you with the care you need, when you need it.  We recommend signing up for the patient portal called "MyChart".  Sign up information is provided on this After Visit Summary.  MyChart is used to connect with patients for Virtual Visits (Telemedicine).  Patients are able to view lab/test results, encounter notes, upcoming appointments, etc.  Non-urgent  messages can be sent to your provider as well.   To learn more about what you can do with MyChart, go to [email or web address].    Your next appointment:   6 month(s) same day as echocardiogram  Provider:   Tonny Bollman, MD

## 2023-08-27 NOTE — Progress Notes (Signed)
Cardiology Office Note:    Date:  08/27/2023   ID:  James, Tracy 1951/06/04, MRN [id number]  PCP:  Eden Emms, NP   [facility] Providers Cardiologist:  Tonny Bollman, MD     Referring MD: Hoy Register, MD   Chief Complaint  Patient presents with   Follow-up    Aortic stenosis     History of Present Illness:    Tracy James is a 72 y.o. female with persistent atrial fibrillation and moderate aortic stenosis, presenting for follow-up evaluation.  The patient has developed severe aortic stenosis.  I saw her last 6 months ago and set her up for a 43-month follow-up visit with an echocardiogram just before her visit today.  The patient is here alone.  She is doing well.  She is doing water aerobics a few days per week with no exertional symptoms.  She also walks without shortness of breath.  She quit smoking about 10 years ago.  She denies chest pain, chest pressure, lightheadedness, syncope, heart palpitations, or leg swelling.  Past Medical History:  Diagnosis Date   Aortic stenosis    Echo 06/2019: mod AS, mean 28 mmHg, peak velocity 3.6 m/s // Echo 9/21: EF 60-65 no RWMA, moderate LVH, normal RVSF, trivial MR, trivial AI, moderate aortic stenosis (mean gradient 36 mmHg; V-max 3.64 m/s; DI 0.18) [reviewed with Dr. Excell Seltzer - mod AS; rpt echo 1 yr]     CHF (congestive heart failure) (HCC)    Chronic systolic and diastolic   CKD (chronic kidney disease)    CKD IIIb   Hypertension    Nonischemic cardiomyopathy    Tachy induced CM // TEE 12/2018: EF 30-35 // Echo 06/2019: EF 55-60 // Cor CTA 8/18: Ca score 0; no CAD   Persistent atrial fibrillation    Admx 12/19 w AF w RVR // R+L atrial thrombus on TEE - DCCV canceled; repeat TEE in 01/2019 w some residual clot // Dofetilide Rx started 07/2019 >> NSR restored without DCCV // CHADS-VASc 4 (6 if include LAA clot) >> Apixaban     Past Surgical History:  Procedure Laterality Date   COLONOSCOPY WITH PROPOFOL N/A  03/30/2022   Procedure: COLONOSCOPY WITH PROPOFOL;  Surgeon: Meryl Dare, MD;  Location: [facility] ENDOSCOPY;  Service: Gastroenterology;  Laterality: N/A;   POLYPECTOMY  03/30/2022   Procedure: POLYPECTOMY;  Surgeon: Meryl Dare, MD;  Location: [facility] ENDOSCOPY;  Service: Gastroenterology;;   TEE WITHOUT CARDIOVERSION N/A 12/06/2018   Procedure: TRANSESOPHAGEAL ECHOCARDIOGRAM (TEE);  Surgeon: Lewayne Bunting, MD;  Location: [facility] ENDOSCOPY;  Service: Cardiovascular;  Laterality: N/A;   TEE WITHOUT CARDIOVERSION N/A 01/20/2019   Procedure: TRANSESOPHAGEAL ECHOCARDIOGRAM (TEE);  Surgeon: Parke Poisson, MD;  Location: [facility] ENDOSCOPY;  Service: Cardiovascular;  Laterality: N/A;   TONSILLECTOMY      Current Medications: Current Meds  Medication Sig   acetaminophen (TYLENOL) 500 MG tablet Take 500 mg by mouth every 6 (six) hours as needed (for pain.).   apixaban (ELIQUIS) 5 MG TABS tablet Take 1 tablet by mouth twice daily   atorvastatin (LIPITOR) 20 MG tablet Take 1 tablet (20 mg total) by mouth daily.   calcium-vitamin D (OSCAL WITH D) 500-200 MG-UNIT TABS tablet Take 1 tablet by mouth daily.    dofetilide (TIKOSYN) 250 MCG capsule Take 1 capsule by mouth twice daily   furosemide (LASIX) 40 MG tablet Take 1 tablet (40 mg total) by mouth daily.   hydroxypropyl methylcellulose / hypromellose (ISOPTO TEARS /  GONIOVISC) 2.5 % ophthalmic solution 1 drop as needed for dry eyes.   loratadine (CLARITIN) 10 MG tablet Take 10 mg by mouth daily as needed for allergies.   mometasone (ELOCON) 0.1 % ointment Apply 1 application  topically daily as needed (for skin rash).   nitroGLYCERIN (NITROSTAT) 0.4 MG SL tablet Place 1 tablet (0.4 mg total) under the tongue every 5 (five) minutes as needed for chest pain.   Olopatadine HCl 0.2 % SOLN Apply 1 drop to eye daily. One drop to affected eye once daily   Omega-3 1000 MG CAPS Take 1,000 mg by mouth daily.    VITAMIN D PO Take 1 tablet by mouth daily.      Allergies:   Patient has no known allergies.   Social History   Socioeconomic History   Marital status: Divorced    Spouse name: Not on file   Number of children: 2   Years of education: Not on file   Highest education level: Not on file  Occupational History   Not on file  Tobacco Use   Smoking status: Former    Current packs/day: 0.00    Types: Cigarettes    Quit date: 12/05/2011    Years since quitting: 11.7   Smokeless tobacco: Never   Tobacco comments:     Former smoker 06/15/22  Vaping Use   Vaping status: Never Used  Substance and Sexual Activity   Alcohol use: Not Currently    Alcohol/week: 1.0 standard drink of alcohol    Types: 1 Glasses of wine per week    Comment: stop drinking occ 2 months 06/15/22   Drug use: No   Sexual activity: Not Currently    Birth control/protection: None  Other Topics Concern   Not on file  Social History Narrative         Tracy James (56)   Tracy James (45)      3 grandchildren all together    Social Determinants of Health   Financial Resource Strain: Low Risk  (10/31/2022)   Overall Financial Resource Strain (CARDIA)    Difficulty of Paying Living Expenses: Not very hard  Food Insecurity: No Food Insecurity (10/31/2022)   Hunger Vital Sign    Worried About Running Out of Food in the Last Year: Never true    Ran Out of Food in the Last Year: Never true  Transportation Needs: No Transportation Needs (10/31/2022)   PRAPARE - Administrator, Civil Service (Medical): No    Lack of Transportation (Non-Medical): No  Physical Activity: Insufficiently Active (10/31/2022)   Exercise Vital Sign    Days of Exercise per Week: 4 days    Minutes of Exercise per Session: 30 min  Stress: No Stress Concern Present (10/31/2022)   [employer] of Occupational Health - Occupational Stress Questionnaire    Feeling of Stress : Not at all  Social Connections: Moderately Integrated (10/31/2022)   Social Connection and Isolation Panel  [NHANES]    Frequency of Communication with Friends and Family: More than three times a week    Frequency of Social Gatherings with Friends and Family: Once a week    Attends Religious Services: More than 4 times per year    Active Member of [employer] or Organizations: Yes    Attends Banker Meetings: 1 to 4 times per year    Marital Status: Divorced     Family History: The patient's family history includes Arthritis in her maternal grandmother and mother; Asthma in her  brother; Cancer in her mother; Diabetes in her brother and mother; Early death in her brother; Heart disease in her father, maternal grandmother, and paternal grandmother; Heart murmur in her brother, brother, and mother; Hypertension in her brother and mother; Lung cancer in her brother.  ROS:   Please see the history of present illness.    All other systems reviewed and are negative.  EKGs/Labs/Other Studies Reviewed:    The following studies were reviewed today: 2D echocardiogram is personally reviewed.  She has normal LVEF.  Peak and mean transvalvular gradients are 83 and 49 mmHg, respectively.  Peak transaortic velocity is 4.5 m/s, dimensionless index 0.18, valve area 0.64 cm.  She has trivial MR and no other significant valvular disease.  The formal interpretation of her echo is currently pending but I have reviewed all the images.      Recent Labs: 02/19/2023: Hemoglobin 12.4; Platelets 261 06/14/2023: BUN 16; Creatinine, Ser 1.42; Magnesium 1.9; Potassium 3.8; Sodium 136  Recent Lipid Panel    Component Value Date/Time   CHOL 144 10/19/2021 1036   TRIG 126 10/19/2021 1036   HDL 40 10/19/2021 1036   CHOLHDL 4.1 11/07/2019 1058   CHOLHDL 3.2 09/26/2017 0910   VLDL 52 (H) 09/22/2016 0829   LDLCALC 81 10/19/2021 1036   LDLCALC 95 09/26/2017 0910           Physical Exam:    VS:  BP (!) 152/72   Pulse 60   Ht 5' 6.5" (1.689 m)   Wt 179 lb (81.2 kg)   SpO2 99%   BMI 28.46 kg/m     Wt  Readings from Last 3 Encounters:  08/27/23 179 lb (81.2 kg)  08/08/23 182 lb 9.6 oz (82.8 kg)  06/14/23 180 lb 6.4 oz (81.8 kg)     GEN:  Well nourished, well developed in no acute distress HEENT: Normal NECK: No JVD; No carotid bruits LYMPHATICS: No lymphadenopathy CARDIAC: RRR, 3/6 harsh crescendo decrescendo murmur at the right upper sternal border, no diastolic murmur RESPIRATORY:  Clear to auscultation without rales, wheezing or rhonchi  ABDOMEN: Soft, non-tender, non-distended MUSCULOSKELETAL:  No edema; No deformity  SKIN: Warm and dry NEUROLOGIC:  Alert and oriented x 3 PSYCHIATRIC:  Normal affect   ASSESSMENT:    1. Persistent atrial fibrillation (HCC)   2. Hypercoagulable state due to persistent atrial fibrillation (HCC)   3. Nonrheumatic aortic valve stenosis   4. Chronic heart failure with preserved ejection fraction (HCC)    PLAN:    In order of problems listed above:  Managed with dofetilide for rhythm control and apixaban for anticoagulation.  Labs from July of this year show a creatinine of 1.42.  Seems to be tolerating anticoagulation without bleeding problems.  She specifically denies any recurrence of lower GI bleeding.  She is asymptomatic from the standpoint of her atrial fibrillation. Managed with apixaban I reviewed her echo images as outlined above.  There has been some progression of her gradients compared with her March 2024 echo.  Her valve area is essentially unchanged.  Mean gradient has increased from 42 to 49 mmHg, dimensionless index has actually increased from 0.16-0.18.  Overall this appears to be stable, she remains asymptomatic, and she opts for continued observation.  She is not eager about having any intervention done.  She is counseled about the cardinal symptoms of aortic stenosis including shortness of breath, chest pain, lightheadedness, or progressive fatigue.  She will keep an eye out for symptoms and I will see  her back in 6 months with an  echocardiogram. Clinically stable with NYHA functional class I symptoms.  Continue daily furosemide and metoprolol.           Medication Adjustments/Labs and Tests Ordered: Current medicines are reviewed at length with the patient today.  Concerns regarding medicines are outlined above.  Orders Placed This Encounter  Procedures   ECHOCARDIOGRAM COMPLETE   No orders of the defined types were placed in this encounter.   Patient Instructions  Medication Instructions:  Your physician recommends that you continue on your current medications as directed. Please refer to the Current Medication list given to you today.  *If you need a refill on your cardiac medications before your next appointment, please call your pharmacy*  Lab Work: If you have labs (blood work) drawn today and your tests are completely normal, you will receive your results only by: MyChart Message (if you have MyChart) OR A paper copy in the mail If you have any lab test that is abnormal or we need to change your treatment, we will call you to review the results.  Testing/Procedures: Your physician has requested that you have an echocardiogram in 6 months same day as office visit. Echocardiography is a painless test that uses sound waves to create images of your heart. It provides your doctor with information about the size and shape of your heart and how well your heart's chambers and valves are working. This procedure takes approximately one hour. There are no restrictions for this procedure. Please do NOT wear cologne, perfume, aftershave, or lotions (deodorant is allowed). Please arrive 15 minutes prior to your appointment time.   Follow-Up: At [facility], you and your health needs are our priority.  As part of our continuing mission to provide you with exceptional heart care, we have created designated Provider Care Teams.  These Care Teams include your primary Cardiologist (physician) and Advanced  Practice Providers (APPs -  Physician Assistants and Nurse Practitioners) who all work together to provide you with the care you need, when you need it.  We recommend signing up for the patient portal called "MyChart".  Sign up information is provided on this After Visit Summary.  MyChart is used to connect with patients for Virtual Visits (Telemedicine).  Patients are able to view lab/test results, encounter notes, upcoming appointments, etc.  Non-urgent messages can be sent to your provider as well.   To learn more about what you can do with MyChart, go to [email or web address].    Your next appointment:   6 month(s) same day as echocardiogram  Provider:   Tonny Bollman, MD       Signed, Tonny Bollman, MD  08/27/2023 5:16 PM    [facility]

## 2023-08-30 ENCOUNTER — Other Ambulatory Visit: Payer: Self-pay | Admitting: Family Medicine

## 2023-08-30 ENCOUNTER — Other Ambulatory Visit: Payer: Self-pay | Admitting: Cardiovascular Disease

## 2023-08-30 DIAGNOSIS — I5022 Chronic systolic (congestive) heart failure: Secondary | ICD-10-CM

## 2023-08-31 NOTE — Telephone Encounter (Signed)
No longer seeing Dr. Hoy Register at [facility].  Established care with Mordecai Maes, NP at [facility].

## 2023-09-03 ENCOUNTER — Other Ambulatory Visit: Payer: Self-pay

## 2023-09-03 DIAGNOSIS — I5022 Chronic systolic (congestive) heart failure: Secondary | ICD-10-CM

## 2023-09-03 MED ORDER — ATORVASTATIN CALCIUM 20 MG PO TABS
20.0000 mg | ORAL_TABLET | Freq: Every day | ORAL | 1 refills | Status: DC
Start: 2023-09-03 — End: 2024-02-27

## 2023-09-03 NOTE — Telephone Encounter (Signed)
Not given by our office in the past. Patient established care 08/08/23.

## 2023-10-10 ENCOUNTER — Ambulatory Visit: Payer: Medicare PPO

## 2023-10-10 VITALS — Ht 66.5 in | Wt 179.0 lb

## 2023-10-10 DIAGNOSIS — Z0000 Encounter for general adult medical examination without abnormal findings: Secondary | ICD-10-CM | POA: Diagnosis not present

## 2023-10-10 NOTE — Progress Notes (Signed)
Subjective:   Tracy James is a 72 y.o. female who presents for Medicare Annual (Subsequent) preventive examination.  Visit Complete: Virtual I connected with  Gabriel Carina on 10/10/23 by a audio enabled telemedicine application and verified that I am speaking with the correct person using two identifiers.  Patient Location: Home  Provider Location: Office/Clinic  I discussed the limitations of evaluation and management by telemedicine. The patient expressed understanding and agreed to proceed.  Vital Signs: Because this visit was a virtual/telehealth visit, some criteria may be missing or patient reported. Any vitals not documented were not able to be obtained and vitals that have been documented are patient reported.  Patient Medicare AWV questionnaire was completed by the patient on (not done); I have confirmed that all information answered by patient is correct and no changes since this date. Cardiac Risk Factors include: advanced age (>70men, >48 women);dyslipidemia;hypertension    Objective:    Today's Vitals   10/10/23 0951  Weight: 179 lb (81.2 kg)  Height: 5' 6.5" (1.689 m)   Body mass index is 28.46 kg/m.     10/10/2023   10:09 AM 10/31/2022    9:55 AM 03/27/2022    1:59 PM 10/17/2021   10:02 AM 11/23/2020    9:30 AM 07/17/2019    2:28 PM 12/06/2018   11:41 AM  Advanced Directives  Does Patient Have a Medical Advance Directive? No No No No No No No  Would patient like information on creating a medical advance directive?  Yes (ED - Information included in AVS) No - Patient declined  Yes (Inpatient - patient defers creating a medical advance directive at this time - Information given) No - Patient declined     Current Medications (verified) Outpatient Encounter Medications as of 10/10/2023  Medication Sig   acetaminophen (TYLENOL) 500 MG tablet Take 500 mg by mouth every 6 (six) hours as needed (for pain.).   apixaban (ELIQUIS) 5 MG TABS tablet Take 1 tablet by  mouth twice daily   atorvastatin (LIPITOR) 20 MG tablet Take 1 tablet (20 mg total) by mouth daily.   calcium-vitamin D (OSCAL WITH D) 500-200 MG-UNIT TABS tablet Take 1 tablet by mouth daily.    dofetilide (TIKOSYN) 250 MCG capsule Take 1 capsule by mouth twice daily   furosemide (LASIX) 40 MG tablet Take 1 tablet (40 mg total) by mouth daily.   hydroxypropyl methylcellulose / hypromellose (ISOPTO TEARS / GONIOVISC) 2.5 % ophthalmic solution 1 drop as needed for dry eyes.   loratadine (CLARITIN) 10 MG tablet Take 10 mg by mouth daily as needed for allergies.   mometasone (ELOCON) 0.1 % ointment Apply 1 application  topically daily as needed (for skin rash).   nitroGLYCERIN (NITROSTAT) 0.4 MG SL tablet Place 1 tablet (0.4 mg total) under the tongue every 5 (five) minutes as needed for chest pain.   Olopatadine HCl 0.2 % SOLN Apply 1 drop to eye daily. One drop to affected eye once daily   Omega-3 1000 MG CAPS Take 1,000 mg by mouth daily.    VITAMIN D PO Take 1 tablet by mouth daily.   metoprolol tartrate (LOPRESSOR) 25 MG tablet Take 1 tablet (25 mg total) by mouth 2 (two) times daily.   No facility-administered encounter medications on file as of 10/10/2023.    Allergies (verified) Patient has no known allergies.   History: Past Medical History:  Diagnosis Date   Aortic stenosis    Echo 06/2019: mod AS, mean 28 mmHg, peak  velocity 3.6 m/s // Echo 9/21: EF 60-65 no RWMA, moderate LVH, normal RVSF, trivial MR, trivial AI, moderate aortic stenosis (mean gradient 36 mmHg; V-max 3.64 m/s; DI 0.18) [reviewed with Dr. Excell Seltzer - mod AS; rpt echo 1 yr]     CHF (congestive heart failure) (HCC)    Chronic systolic and diastolic   CKD (chronic kidney disease)    CKD IIIb   Hypertension    Nonischemic cardiomyopathy    Tachy induced CM // TEE 12/2018: EF 30-35 // Echo 06/2019: EF 55-60 // Cor CTA 8/18: Ca score 0; no CAD   Persistent atrial fibrillation    Admx 12/19 w AF w RVR // R+L atrial  thrombus on TEE - DCCV canceled; repeat TEE in 01/2019 w some residual clot // Dofetilide Rx started 07/2019 >> NSR restored without DCCV // CHADS-VASc 4 (6 if include LAA clot) >> Apixaban    Past Surgical History:  Procedure Laterality Date   COLONOSCOPY WITH PROPOFOL N/A 03/30/2022   Procedure: COLONOSCOPY WITH PROPOFOL;  Surgeon: Meryl Dare, MD;  Location: [facility] ENDOSCOPY;  Service: Gastroenterology;  Laterality: N/A;   POLYPECTOMY  03/30/2022   Procedure: POLYPECTOMY;  Surgeon: Meryl Dare, MD;  Location: [facility] ENDOSCOPY;  Service: Gastroenterology;;   TEE WITHOUT CARDIOVERSION N/A 12/06/2018   Procedure: TRANSESOPHAGEAL ECHOCARDIOGRAM (TEE);  Surgeon: Lewayne Bunting, MD;  Location: [facility] ENDOSCOPY;  Service: Cardiovascular;  Laterality: N/A;   TEE WITHOUT CARDIOVERSION N/A 01/20/2019   Procedure: TRANSESOPHAGEAL ECHOCARDIOGRAM (TEE);  Surgeon: Parke Poisson, MD;  Location: [facility] ENDOSCOPY;  Service: Cardiovascular;  Laterality: N/A;   TONSILLECTOMY     Family History  Problem Relation Age of Onset   Arthritis Mother    Cancer Mother        colon cancer   Diabetes Mother    Hypertension Mother    Heart murmur Mother    Early death Brother    Heart murmur Brother    Lung cancer Brother    Arthritis Maternal Grandmother    Heart disease Maternal Grandmother        massive heart attack   Heart disease Paternal Grandmother    Asthma Brother    Diabetes Brother    Hypertension Brother    Heart murmur Brother    Heart disease Father        anuersym- ruptered   Social History   Socioeconomic History   Marital status: Divorced    Spouse name: Not on file   Number of children: 2   Years of education: Not on file   Highest education level: Not on file  Occupational History   Not on file  Tobacco Use   Smoking status: Former    Current packs/day: 0.00    Types: Cigarettes    Quit date: 12/05/2011    Years since quitting: 11.8   Smokeless tobacco: Never   Tobacco comments:      Former smoker 06/15/22  Vaping Use   Vaping status: Never Used  Substance and Sexual Activity   Alcohol use: Not Currently    Alcohol/week: 1.0 standard drink of alcohol    Types: 1 Glasses of wine per week    Comment: stop drinking occ 2 months 06/15/22   Drug use: No   Sexual activity: Not Currently    Birth control/protection: None  Other Topics Concern   Not on file  Social History Narrative         [name] (54)   [name] (43)  3 grandchildren all together    Social Determinants of Health   Financial Resource Strain: Low Risk  (10/10/2023)   Overall Financial Resource Strain (CARDIA)    Difficulty of Paying Living Expenses: Not hard at all  Food Insecurity: No Food Insecurity (10/10/2023)   Hunger Vital Sign    Worried About Running Out of Food in the Last Year: Never true    Ran Out of Food in the Last Year: Never true  Transportation Needs: No Transportation Needs (10/10/2023)   PRAPARE - Administrator, Civil Service (Medical): No    Lack of Transportation (Non-Medical): No  Physical Activity: Sufficiently Active (10/10/2023)   Exercise Vital Sign    Days of Exercise per Week: 4 days    Minutes of Exercise per Session: 40 min  Stress: No Stress Concern Present (10/10/2023)   [employer] of Occupational Health - Occupational Stress Questionnaire    Feeling of Stress : Not at all  Social Connections: Moderately Integrated (10/10/2023)   Social Connection and Isolation Panel [NHANES]    Frequency of Communication with Friends and Family: More than three times a week    Frequency of Social Gatherings with Friends and Family: Once a week    Attends Religious Services: More than 4 times per year    Active Member of [employer] or Organizations: Yes    Attends Banker Meetings: 1 to 4 times per year    Marital Status: Divorced    Tobacco Counseling Counseling given: Not Answered Tobacco comments:  Former smoker 06/15/22   Clinical  Intake:  Pre-visit preparation completed: No  Pain : No/denies pain     BMI - recorded: 28.46 Nutritional Status: BMI 25 -29 Overweight Nutritional Risks: None Diabetes: No  How often do you need to have someone help you when you read instructions, pamphlets, or other written materials from your doctor or pharmacy?: 1 - Never  Interpreter Needed?: No  Comments: lives alone Information entered by :: B.Jarod Bozzo,LPN   Activities of Daily Living    10/10/2023   10:09 AM 10/31/2022    9:56 AM  In your present state of health, do you have any difficulty performing the following activities:  Hearing? 0 0  Vision? 0 0  Difficulty concentrating or making decisions? 0 0  Walking or climbing stairs? 0 0  Dressing or bathing? 0 0  Doing errands, shopping? 0 0  Preparing Food and eating ? N N  Using the Toilet? N N  In the past six months, have you accidently leaked urine? Y Y  Do you have problems with loss of bowel control? N N  Managing your Medications? N N  Managing your Finances? N N  Housekeeping or managing your Housekeeping? N N    Patient Care Team: Eden Emms, NP as PCP - General (Nurse Practitioner) Tonny Bollman, MD as PCP - Cardiology (Cardiology)  Indicate any recent Medical Services you may have received from other than [facility] providers in the past year (date may be approximate).     Assessment:   This is a routine wellness examination for Tracy James.  Hearing/Vision screen Hearing Screening - Comments:: Pt says her hearing is good Vision Screening - Comments:: Pt says her vision is good with [facility]-had exam in Jan'24   Goals Addressed             This Visit's Progress    Exercise 150 min/wk Moderate Activity   On track  Depression Screen    10/10/2023   10:03 AM 08/08/2023    3:02 PM 10/31/2022    9:56 AM 10/31/2022    9:55 AM 07/31/2022    3:46 PM 04/10/2022   11:30 AM 10/17/2021   10:06 AM  PHQ 2/9 Scores  PHQ - 2 Score  0 0 0 0 0 1 0  PHQ- 9 Score  0   1 5     Fall Risk    10/10/2023    9:55 AM 08/08/2023    3:03 PM 10/31/2022    9:55 AM 07/31/2022    3:45 PM 04/10/2022   11:29 AM  Fall Risk   Falls in the past year? 1 1 0 0 0  Comment one fall outside stepping on pine cone      Number falls in past yr: 0 0 0 0 0  Injury with Fall? 1 1 0 0 0  Risk for fall due to : No Fall Risks Other (Comment) No Fall Risks No Fall Risks No Fall Risks  Follow up Education provided;Falls prevention discussed Falls evaluation completed   Falls evaluation completed    MEDICARE RISK AT HOME: Medicare Risk at Home Any stairs in or around the home?: Yes If so, are there any without handrails?: Yes Home free of loose throw rugs in walkways, pet beds, electrical cords, etc?: Yes Adequate lighting in your home to reduce risk of falls?: Yes Life alert?: No Use of a cane, walker or w/c?: No Grab bars in the bathroom?: Yes Shower chair or bench in shower?: Yes Elevated toilet seat or a handicapped toilet?: Yes  TIMED UP AND GO:  Was the test performed?  No    Cognitive Function:    11/23/2020    9:32 AM 11/23/2020    9:03 AM  MMSE - Mini Mental State Exam  Orientation to time 5 5  Orientation to Place 5 5  Registration 3   Attention/ Calculation 5 5  Recall 2   Language- name 2 objects 2   Language- repeat 1   Language- follow 3 step command 3   Language- read & follow direction 1   Write a sentence 1   Copy design 1   Total score 29         10/10/2023   10:10 AM 10/31/2022    9:57 AM  6CIT Screen  What Year? 0 points 0 points  What month? 0 points 0 points  What time? 0 points 0 points  Count back from 20 0 points 0 points  Months in reverse 0 points 0 points  Repeat phrase 2 points 0 points  Total Score 2 points 0 points    Immunizations Immunization History  Administered Date(s) Administered   PFIZER(Purple Top)SARS-COV-2 Vaccination 02/11/2020, 03/10/2020, 09/23/2020    Pfizer(Comirnaty)Fall Seasonal Vaccine 12 years and older 10/31/2022   Pneumococcal Conjugate-13 12/04/2018   Pneumococcal Polysaccharide-23 01/15/2019    TDAP status: Up to date  Flu Vaccine status: Declined, Education has been provided regarding the importance of this vaccine but patient still declined. Advised may receive this vaccine at local pharmacy or Health Dept. Aware to provide a copy of the vaccination record if obtained from local pharmacy or Health Dept. Verbalized acceptance and understanding.  Pneumococcal vaccine status: Up to date  Covid-19 vaccine status: Completed vaccines  Qualifies for Shingles Vaccine? Yes   Zostavax completed No   Shingrix Completed?: No.    Education has been provided regarding the importance of this vaccine. Patient has  been advised to call insurance company to determine out of pocket expense if they have not yet received this vaccine. Advised may also receive vaccine at local pharmacy or Health Dept. Verbalized acceptance and understanding.  Screening Tests Health Maintenance  Topic Date Due   COVID-19 Vaccine (5 - 2023-24 season) 10/26/2023 (Originally 08/05/2023)   Zoster Vaccines- Shingrix (1 of 2) 01/10/2024 (Originally 07/14/1970)   DTaP/Tdap/Td (1 - Tdap) 10/09/2024 (Originally 07/14/1970)   INFLUENZA VACCINE  12/04/2048 (Originally 07/05/2023)   Medicare Annual Wellness (AWV)  10/09/2024   Fecal DNA (Cologuard)  10/24/2024   MAMMOGRAM  02/13/2025   Pneumonia Vaccine 87+ Years old  Completed   DEXA SCAN  Completed   Hepatitis C Screening  Completed   HPV VACCINES  Aged Out   Colonoscopy  Discontinued    Health Maintenance  There are no preventive care reminders to display for this patient.   Colorectal cancer screening: No longer required.  Discontinued   Mammogram status: Completed 02/14/2023. Repeat every year  Bone Density status: Completed 11/13/2016. Results reflect: Bone density results: NORMAL. Repeat every 5 years.  Lung  Cancer Screening: (Low Dose CT Chest recommended if Age 21-80 years, 20 pack-year currently smoking OR have quit w/in 15years.) does not qualify.   Lung Cancer Screening Referral: no  Additional Screening:  Hepatitis C Screening: does not qualify; Completed 09/22/2016  Vision Screening: Recommended annual ophthalmology exams for early detection of glaucoma and other disorders of the eye. Is the patient up to date with their annual eye exam?  Yes  Who is the provider or what is the name of the office in which the patient attends annual eye exams? Pt does not know name but seen last in Jan 2024 If pt is not established with a provider, would they like to be referred to a provider to establish care? No .   Dental Screening: Recommended annual dental exams for proper oral hygiene  Diabetic Foot Exam: n/a  Community Resource Referral / Chronic Care Management: CRR required this visit?  No   CCM required this visit?  No    Plan:     I have personally reviewed and noted the following in the patient's chart:   Medical and social history Use of alcohol, tobacco or illicit drugs  Current medications and supplements including opioid prescriptions. Patient is not currently taking opioid prescriptions. Functional ability and status Nutritional status Physical activity Advanced directives List of other physicians Hospitalizations, surgeries, and ER visits in previous 12 months Vitals Screenings to include cognitive, depression, and falls Referrals and appointments  In addition, I have reviewed and discussed with patient certain preventive protocols, quality metrics, and best practice recommendations. A written personalized care plan for preventive services as well as general preventive health recommendations were provided to patient.    Sue Lush, LPN   [id number]/05/[id number]   After Visit Summary: (Declined) Due to this being a telephonic visit, with patients personalized plan was offered  to patient but patient Declined AVS at this time   Nurse Notes: The patient states she is doing well and has no concerns or questions at this time.

## 2023-10-10 NOTE — Patient Instructions (Signed)
Tracy James , Thank you for taking time to come for your Medicare Wellness Visit. I appreciate your ongoing commitment to your health goals. Please review the following plan we discussed and let me know if I can assist you in the future.   Referrals/Orders/Follow-Ups/Clinician Recommendations: none  This is a list of the screening recommended for you and due dates:  Health Maintenance  Topic Date Due   DTaP/Tdap/Td vaccine (1 - Tdap) Never done   Zoster (Shingles) Vaccine (1 of 2) Never done   COVID-19 Vaccine (5 - 2023-24 season) 08/05/2023   Flu Shot  12/04/2048*   Medicare Annual Wellness Visit  10/09/2024   Cologuard (Stool DNA test)  10/24/2024   Mammogram  02/13/2025   Pneumonia Vaccine  Completed   DEXA scan (bone density measurement)  Completed   Hepatitis C Screening  Completed   HPV Vaccine  Aged Out   Colon Cancer Screening  Discontinued  *Topic was postponed. The date shown is not the original due date.    Advanced directives: (Provided) Advance directive discussed with you today. I have provided a copy for you to complete at home and have notarized. Once this is complete, please bring a copy in to our office so we can scan it into your chart.  Mailed to pt home per request  Next Medicare Annual Wellness Visit scheduled for next year: Yes 10/10/2024 @ 10:50am telephone

## 2023-10-22 ENCOUNTER — Other Ambulatory Visit: Payer: Self-pay | Admitting: Physician Assistant

## 2023-10-22 ENCOUNTER — Telehealth: Payer: Self-pay | Admitting: Cardiovascular Disease

## 2023-10-22 ENCOUNTER — Other Ambulatory Visit: Payer: Self-pay | Admitting: Nurse Practitioner

## 2023-10-22 DIAGNOSIS — N1831 Chronic kidney disease, stage 3a: Secondary | ICD-10-CM

## 2023-10-22 DIAGNOSIS — I10 Essential (primary) hypertension: Secondary | ICD-10-CM

## 2023-10-22 DIAGNOSIS — I5022 Chronic systolic (congestive) heart failure: Secondary | ICD-10-CM

## 2023-10-22 MED ORDER — METOPROLOL TARTRATE 25 MG PO TABS: 25.0000 mg | ORAL_TABLET | Freq: Two times a day (BID) | ORAL | 2 refills | Status: AC

## 2023-10-22 NOTE — Telephone Encounter (Signed)
 RX sent to requested Pharmacy

## 2023-10-22 NOTE — Telephone Encounter (Signed)
*  STAT* If patient is at the pharmacy, call can be transferred to refill team.   1. Which medications need to be refilled? (please list name of each medication and dose if known) metoprolol tartrate (LOPRESSOR) 25 MG tablet (Expired)  Pt is taking half a pill twice a day   2. Which pharmacy/location (including street and city if local pharmacy) is medication to be sent to?  [facility] [place], [place] - [address] Phone: [phone]  Fax: [phone]      3. Do they need a 30 day or 90 day supply? 90

## 2023-11-07 ENCOUNTER — Encounter: Payer: Medicare PPO | Admitting: Nurse Practitioner

## 2023-12-13 ENCOUNTER — Ambulatory Visit (INDEPENDENT_AMBULATORY_CARE_PROVIDER_SITE_OTHER): Payer: Medicare PPO | Admitting: Nurse Practitioner

## 2023-12-13 ENCOUNTER — Encounter: Payer: Self-pay | Admitting: Nurse Practitioner

## 2023-12-13 VITALS — BP 102/72 | HR 67 | Temp 97.6°F | Ht 66.0 in | Wt 184.2 lb

## 2023-12-13 DIAGNOSIS — N1832 Chronic kidney disease, stage 3b: Secondary | ICD-10-CM

## 2023-12-13 DIAGNOSIS — E781 Pure hyperglyceridemia: Secondary | ICD-10-CM | POA: Diagnosis not present

## 2023-12-13 DIAGNOSIS — I10 Essential (primary) hypertension: Secondary | ICD-10-CM | POA: Diagnosis not present

## 2023-12-13 DIAGNOSIS — Z87891 Personal history of nicotine dependence: Secondary | ICD-10-CM | POA: Diagnosis not present

## 2023-12-13 DIAGNOSIS — Z13820 Encounter for screening for osteoporosis: Secondary | ICD-10-CM

## 2023-12-13 DIAGNOSIS — D6869 Other thrombophilia: Secondary | ICD-10-CM | POA: Diagnosis not present

## 2023-12-13 DIAGNOSIS — Z09 Encounter for follow-up examination after completed treatment for conditions other than malignant neoplasm: Secondary | ICD-10-CM | POA: Diagnosis not present

## 2023-12-13 DIAGNOSIS — I5022 Chronic systolic (congestive) heart failure: Secondary | ICD-10-CM | POA: Diagnosis not present

## 2023-12-13 DIAGNOSIS — R7303 Prediabetes: Secondary | ICD-10-CM | POA: Diagnosis not present

## 2023-12-13 DIAGNOSIS — I4819 Other persistent atrial fibrillation: Secondary | ICD-10-CM

## 2023-12-13 DIAGNOSIS — Z0000 Encounter for general adult medical examination without abnormal findings: Secondary | ICD-10-CM | POA: Diagnosis not present

## 2023-12-13 DIAGNOSIS — Z23 Encounter for immunization: Secondary | ICD-10-CM | POA: Diagnosis not present

## 2023-12-13 DIAGNOSIS — Z1231 Encounter for screening mammogram for malignant neoplasm of breast: Secondary | ICD-10-CM

## 2023-12-13 NOTE — Assessment & Plan Note (Signed)
Pending CMP today.

## 2023-12-13 NOTE — Assessment & Plan Note (Signed)
 History of the same.  Patient currently maintained on atorvastatin 20 mg.  Pending lipid panel today

## 2023-12-13 NOTE — Assessment & Plan Note (Signed)
 Former smoker.  Check urine microscopy rule out microscopic hematuria

## 2023-12-13 NOTE — Progress Notes (Signed)
 Established Patient Office Visit  Subjective   Patient ID: Tracy James, female    DOB: [birth year]  Age: 73 y.o. MRN: [id number]  Chief Complaint  Patient presents with   Annual Exam    HPI  HTN: Patient currently maintained on metoprolol  25 mg twice daily and furosemide  40 mg daily. Does check bp at home. Will she checks it is feeling off   AFIB: Patient currently maintained on Tikosyn  250 mcg and apixaban  5 mg twice daily she is followed by cardiology  CHF: Patient currently maintained on metoprolol  25 mg twice daily furosemide  40 mg daily  HLD: Patient currently maintained on atorvastatin  20 mg    for complete physical and follow up of chronic conditions.  Immunizations: -Tetanus: Completed in unsure.  Completed at local pharmacy -Influenza: refused  -Shingles: get at local pharmacy.  Discussed in office -Pneumonia: Completed 2020 patient is eligible for Prevnar 20 Covid: original and boosters  Diet: Fair diet. 2-3 and some snacks. Decaff coffee and water Exercise: No regular exercise. Water aerobics 2 days a week. States that she will walk 1-2  2 days a week   Eye exam: Completes annually. Wears glasses  Dental exam: PRN  Colonoscopy: Completed in 03/10/2022, recall 7 years 2030 Lung Cancer Screening: Completed in smoked for approx 20 years 0.5   Pap smear: aged out. Did have MR pelvis on 05/22/2022  Mammogram: [place]/14/[place]23. [place]  Dexa:  2017, this was normal.  Sleep: she will go to bed 630-7 and will be asleep after that. She will wake up around 5. States she feels rested. Does snore   Advance directives:  does not have anything. Does have the advacned directive pack.  Patient had a lot going on recently.  States she went to visit her daughter in [place]  for Thanksgiving she got there on Sunday and on Wednesday found out that her son who is 8 years old passed away.  She has talked with her daughter in regards to last wishes.  She plans on completing  the pack.       Review of Systems  Constitutional:  Negative for chills and fever.  Respiratory:  Negative for shortness of breath.   Cardiovascular:  Negative for chest pain and leg swelling.  Gastrointestinal:  Negative for abdominal pain, blood in stool, constipation, diarrhea, nausea and vomiting.       BM daily most times then every other day   Genitourinary:  Negative for dysuria and hematuria.  Neurological:  Negative for tingling and headaches.  Psychiatric/Behavioral:  Negative for hallucinations and suicidal ideas.       Objective:     BP 102/72   Pulse 67   Temp 97.6 F (36.4 C) (Oral)   Ht 5' 6 (1.676 m)   Wt 184 lb 3.2 oz (83.6 kg)   SpO2 99%   BMI 29.73 kg/m    Physical Exam Vitals and nursing note reviewed.  Constitutional:      Appearance: Normal appearance.  HENT:     Right Ear: Tympanic membrane, ear canal and external ear normal.     Left Ear: Tympanic membrane, ear canal and external ear normal.     Mouth/Throat:     Mouth: Mucous membranes are moist.     Pharynx: Oropharynx is clear.  Eyes:     Extraocular Movements: Extraocular movements intact.     Pupils: Pupils are equal, round, and reactive to light.  Cardiovascular:     Rate and Rhythm: Normal rate and  regular rhythm.     Pulses: Normal pulses.     Heart sounds: Normal heart sounds.  Pulmonary:     Effort: Pulmonary effort is normal.     Breath sounds: Normal breath sounds.  Abdominal:     General: Bowel sounds are normal. There is no distension.     Palpations: There is no mass.     Tenderness: There is no abdominal tenderness.     Hernia: No hernia is present.  Musculoskeletal:     Right lower leg: No edema.     Left lower leg: No edema.  Lymphadenopathy:     Cervical: No cervical adenopathy.  Skin:    General: Skin is warm.  Neurological:     General: No focal deficit present.     Mental Status: She is alert.     Deep Tendon Reflexes:     Reflex Scores:      Bicep  reflexes are 2+ on the right side and 2+ on the left side.      Patellar reflexes are 2+ on the right side and 2+ on the left side.    Comments: Bilateral upper and lower extremity strength 5/5  Psychiatric:        Mood and Affect: Mood normal.        Behavior: Behavior normal.        Thought Content: Thought content normal.        Judgment: Judgment normal.      No results found for any visits on 12/13/23.    The 10-year ASCVD risk score (Arnett DK, et al., 2019) is: 5.7%    Assessment & Plan:   Problem List Items Addressed This Visit       Cardiovascular and Mediastinum   Essential hypertension   Patient currently maintained on metoprolol .  Blood pressure under normal limits continue medication as prescribed      Relevant Orders   CBC   Comprehensive metabolic panel   TSH   Persistent atrial fibrillation (HCC)   Patient currently maintained on metoprolol , dofetilide , apixaban .  Continue taking medication as prescribed follow-up with cardiology as recommended      Chronic systolic CHF (congestive heart failure) [facility])   Patient is followed by Ozell Fell, MD cardiologist.  Patient is currently on metoprolol  and furosemide .  Continue medication as prescribed follow-up with cardiology as recommended      Hypercoagulable state due to persistent atrial fibrillation [facility])   Patient currently maintained on apixaban  5 mg twice daily continue        Genitourinary   Chronic kidney disease, stage 3b (HCC)   Pending CMP today        Other   Hypertriglyceridemia   History of the same.  Patient currently maintained on atorvastatin  20 mg.  Pending lipid panel today      Relevant Orders   Lipid panel   Prediabetes   Pending A1c today      Relevant Orders   Hemoglobin A1c   Preventative health care - Primary   Discussed age-appropriate immunizations and screening exams.  Did review patient's personal, surgical, social, family histories.  Patient is up-to-date on  all age-appropriate vaccinations he would like.  Patient refused flu vaccine.  She get the shingles and tetanus vaccine at local pharmacy.  Update pneumonia vaccine today in office.  Patient is up-to-date on CRC screening.  Patient no longer participates in cervical cancer screening.  Mammogram placed for breast cancer screening.  DEXA scan placed for osteoporosis screening.  Patient was given information at discharge about preventative healthcare maintenance with anticipatory guidance.      Former smoker   Former smoker.  Check urine microscopy rule out microscopic hematuria      Relevant Orders   Urine Microscopic   Other Visit Diagnoses       Need for pneumococcal 20-valent conjugate vaccination       Relevant Orders   Pneumococcal conjugate vaccine 20-valent (Prevnar 20) (Completed)     Screening mammogram for breast cancer       Relevant Orders   MM 3D SCREENING MAMMOGRAM BILATERAL BREAST     Screening for osteoporosis       Relevant Orders   DG Bone Density       Return in about 1 year (around 12/12/2024) for CPE and Labs.    Adina Crandall, NP

## 2023-12-13 NOTE — Assessment & Plan Note (Signed)
 Patient is followed by Tonny Bollman, MD cardiologist.  Patient is currently on metoprolol and furosemide.  Continue medication as prescribed follow-up with cardiology as recommended

## 2023-12-13 NOTE — Assessment & Plan Note (Signed)
 Patient currently maintained on metoprolol, dofetilide, apixaban.  Continue taking medication as prescribed follow-up with cardiology as recommended

## 2023-12-13 NOTE — Assessment & Plan Note (Signed)
 Patient currently maintained on metoprolol.  Blood pressure under normal limits continue medication as prescribed

## 2023-12-13 NOTE — Assessment & Plan Note (Signed)
Patient currently maintained on apixaban 5 mg twice daily continue

## 2023-12-13 NOTE — Assessment & Plan Note (Signed)
Pending A1c today 

## 2023-12-13 NOTE — Assessment & Plan Note (Signed)
 Discussed age-appropriate immunizations and screening exams.  Did review patient's personal, surgical, social, family histories.  Patient is up-to-date on all age-appropriate vaccinations he would like.  Patient refused flu vaccine.  She get the shingles and tetanus vaccine at local pharmacy.  Update pneumonia vaccine today in office.  Patient is up-to-date on CRC screening.  Patient no longer participates in cervical cancer screening.  Mammogram placed for breast cancer screening.  DEXA scan placed for osteoporosis screening.  Patient was given information at discharge about preventative healthcare maintenance with anticipatory guidance.

## 2023-12-13 NOTE — Patient Instructions (Signed)
 Nice to see you today I will be in touch with the labs once I have them Follow up with me in 1 year, sooner if you need me   We did update your pneumonia vaccine today

## 2023-12-14 LAB — URINALYSIS, MICROSCOPIC ONLY

## 2023-12-14 LAB — LIPID PANEL
Cholesterol: 164 mg/dL (ref 0–200)
HDL: 50.3 mg/dL (ref >39.00)
LDL Cholesterol: 83 mg/dL (ref 0–99)
NonHDL: 113.25
Total CHOL/HDL Ratio: 3
Triglycerides: 152 mg/dL — ABNORMAL HIGH (ref 0.0–149.0)
VLDL: 30.4 mg/dL (ref 0.0–40.0)

## 2023-12-14 LAB — CBC
HCT: 40.1 % (ref 36.0–46.0)
Hemoglobin: 12.9 g/dL (ref 12.0–15.0)
MCHC: 32.1 g/dL (ref 30.0–36.0)
MCV: 90.2 fL (ref 78.0–100.0)
Platelets: 275 10*3/uL (ref 150.0–400.0)
RBC: 4.45 Mil/uL (ref 3.87–5.11)
RDW: 14.9 % (ref 11.5–15.5)
WBC: 8.3 10*3/uL (ref 4.0–10.5)

## 2023-12-14 LAB — COMPREHENSIVE METABOLIC PANEL
ALT: 19 U/L (ref 0–35)
AST: 24 U/L (ref 0–37)
Albumin: 4.2 g/dL (ref 3.5–5.2)
Alkaline Phosphatase: 91 U/L (ref 39–117)
BUN: 18 mg/dL (ref 6–23)
CO2: 30 meq/L (ref 19–32)
Calcium: 9.7 mg/dL (ref 8.4–10.5)
Chloride: 102 meq/L (ref 96–112)
Creatinine, Ser: 1.01 mg/dL (ref 0.40–1.20)
GFR: 56.44 mL/min — ABNORMAL LOW (ref >60.00)
Glucose, Bld: 91 mg/dL (ref 70–99)
Potassium: 4.5 meq/L (ref 3.5–5.1)
Sodium: 141 meq/L (ref 135–145)
Total Bilirubin: 0.5 mg/dL (ref 0.2–1.2)
Total Protein: 7.1 g/dL (ref 6.0–8.3)

## 2023-12-14 LAB — TSH: TSH: 1.51 u[IU]/mL (ref 0.35–5.50)

## 2023-12-14 LAB — HEMOGLOBIN A1C: Hgb A1c MFr Bld: 6.5 % (ref 4.6–6.5)

## 2023-12-19 ENCOUNTER — Other Ambulatory Visit: Payer: Self-pay | Admitting: Nurse Practitioner

## 2023-12-19 DIAGNOSIS — E119 Type 2 diabetes mellitus without complications: Secondary | ICD-10-CM

## 2023-12-20 ENCOUNTER — Ambulatory Visit (HOSPITAL_COMMUNITY)
Admission: RE | Admit: 2023-12-20 | Discharge: 2023-12-20 | Disposition: A | Discharge status: Home / Self Care | Payer: Medicare PPO | Source: Ambulatory Visit | Attending: Physician Assistant | Admitting: Physician Assistant

## 2023-12-20 ENCOUNTER — Telehealth: Payer: Self-pay | Admitting: Nurse Practitioner

## 2023-12-20 VITALS — BP 148/70 | HR 63 | Ht 66.0 in | Wt 184.0 lb

## 2023-12-20 DIAGNOSIS — Z79899 Other long term (current) drug therapy: Secondary | ICD-10-CM

## 2023-12-20 DIAGNOSIS — Z7901 Long term (current) use of anticoagulants: Secondary | ICD-10-CM | POA: Insufficient documentation

## 2023-12-20 DIAGNOSIS — Z5181 Encounter for therapeutic drug level monitoring: Secondary | ICD-10-CM | POA: Diagnosis not present

## 2023-12-20 DIAGNOSIS — D6869 Other thrombophilia: Secondary | ICD-10-CM | POA: Diagnosis not present

## 2023-12-20 DIAGNOSIS — N1832 Chronic kidney disease, stage 3b: Secondary | ICD-10-CM | POA: Diagnosis not present

## 2023-12-20 DIAGNOSIS — I4819 Other persistent atrial fibrillation: Secondary | ICD-10-CM | POA: Diagnosis not present

## 2023-12-20 DIAGNOSIS — I429 Cardiomyopathy, unspecified: Secondary | ICD-10-CM | POA: Insufficient documentation

## 2023-12-20 DIAGNOSIS — I509 Heart failure, unspecified: Secondary | ICD-10-CM | POA: Diagnosis not present

## 2023-12-20 DIAGNOSIS — E119 Type 2 diabetes mellitus without complications: Secondary | ICD-10-CM

## 2023-12-20 DIAGNOSIS — I130 Hypertensive heart and chronic kidney disease with heart failure and stage 1 through stage 4 chronic kidney disease, or unspecified chronic kidney disease: Secondary | ICD-10-CM | POA: Insufficient documentation

## 2023-12-20 DIAGNOSIS — I350 Nonrheumatic aortic (valve) stenosis: Secondary | ICD-10-CM | POA: Diagnosis not present

## 2023-12-20 DIAGNOSIS — Q2381 Bicuspid aortic valve: Secondary | ICD-10-CM | POA: Insufficient documentation

## 2023-12-20 LAB — MAGNESIUM: Magnesium: 2 mg/dL (ref 1.7–2.4)
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
## 2023-12-20 NOTE — Telephone Encounter (Signed)
-----   Message from [facility] T sent at 12/20/2023  3:30 PM EST ----- Called patient reviewed all information and repeated back to me.   Pt prefers [place] location. Scheduled pt for 3 month DM recheck 4/16. @9AM .  No further questions or concerns.

## 2024-01-30 ENCOUNTER — Other Ambulatory Visit: Payer: Self-pay | Admitting: Nurse Practitioner

## 2024-01-30 DIAGNOSIS — I10 Essential (primary) hypertension: Secondary | ICD-10-CM

## 2024-01-30 DIAGNOSIS — N1831 Chronic kidney disease, stage 3a: Secondary | ICD-10-CM

## 2024-01-30 DIAGNOSIS — I5022 Chronic systolic (congestive) heart failure: Secondary | ICD-10-CM

## 2024-02-07 ENCOUNTER — Encounter: Payer: Self-pay | Admitting: Dietician

## 2024-02-07 ENCOUNTER — Encounter: Payer: Medicare PPO | Attending: Nurse Practitioner | Admitting: Dietician

## 2024-02-07 DIAGNOSIS — Z713 Dietary counseling and surveillance: Secondary | ICD-10-CM | POA: Insufficient documentation

## 2024-02-07 DIAGNOSIS — E119 Type 2 diabetes mellitus without complications: Secondary | ICD-10-CM | POA: Diagnosis not present

## 2024-02-07 NOTE — Progress Notes (Signed)
 Patient was seen on 02/07/2024 for the first of a series of three diabetes self-management courses at [facility].  Patient Education Plan per assessed needs and concerns is to attend three course education program for Diabetes Self Management Education.  A1C was 6.5% on 12/13/2023.  The following learning objectives were met by the patient during this class: Describe diabetes, types of diabetes and pathophysiology State some common risk factors for diabetes Defines the role of glucose and insulin Describe the relationship between diabetes and cardiovascular and other risks State the members of the Healthcare Team States the rationale for glucose monitoring and when to test State their individual Target Range State the importance of logging glucose readings and how to interpret the readings Identifies A1C target Explain the correlation between A1c and eAG values State symptoms and treatment of high blood glucose and low blood glucose Explain proper technique for glucose testing and identify proper sharps disposal  Handouts given during class include: How to Thrive:  A Guide for Your Journey with Diabetes by the ADA Meal Plan Card and carbohydrate content list Dietary intake form Low Sodium Flavoring Tips Types of Fats Dining Out Label reading Snack list The diabetes portion plate Diabetes Resources A1c to eAG Conversion Chart Blood Glucose Log Diabetes Recommended Care Schedule Support Group Diabetes Success Plan Core Class Satisfaction Survey   Follow-Up Plan: Attend core 2

## 2024-02-08 ENCOUNTER — Ambulatory Visit: Payer: Self-pay | Admitting: Nurse Practitioner

## 2024-02-08 NOTE — Telephone Encounter (Signed)
Noted.  Agree with disposition.

## 2024-02-08 NOTE — Telephone Encounter (Signed)
 Copied from CRM [id number]. Topic: Clinical - Red Word Triage >> Feb 08, 2024 12:50 PM Denese Killings wrote: Red Word that prompted transfer to Nurse Triage: Patient had two episodes yesterday and this morning. Symptoms- blood pressure was 122/78 and then 111/72 with light headedness.   Chief Complaint: Dizziness Symptoms: Dizziness Frequency: Intermittent  Pertinent Negatives: Patient denies any chest pain, numbness, or tingling  Disposition: [] ED /[] Urgent Care (no appt availability in office) / [] Appointment(In office/virtual)/ []  [place] [facility]/ [x] Home Care/ [] Refused Recommended Disposition /[] [place] [facility]/ []  Follow-up with PCP Additional Notes: Patient reports that since yesterday she has had two episodes of dizziness when getting up. She states "I just felt wobbly." She states her symptoms were short lasting, improved after sitting down, and that she does not have any dizziness currently. Patient states she checked her blood pressure after each episode of dizziness, yesterday it was 122/78 and today it was 111/72, and wanted to make sure it wasn't her blood pressure causing her dizziness. I advised that those blood pressures are good and should not cause dizziness. Patient understood. Offered an appointment but patient declined. Patient advised to call back on Monday if she has further episodes of dizziness and advised her to go to urgent care or the ED over the weekend if the dizziness worsens or if she has any associated chest pain, numbness, tingling, or headache. Patient understood and is agreeable with this plan.    Reason for Disposition  Dizziness caused by sudden or prolonged standing  Answer Assessment - Initial Assessment Questions 1. DESCRIPTION: "Describe your dizziness."     Lightheaded 2. LIGHTHEADED: "Do you feel lightheaded?" (e.g., somewhat faint, woozy, weak upon standing)     Yes 3. VERTIGO: "Do you feel like either you or the room is spinning or  tilting?" (i.e. vertigo)     No 4. SEVERITY: "How bad is it?"  "Do you feel like you are going to faint?" "Can you stand and walk?"   - MILD: Feels slightly dizzy, but walking normally.   - MODERATE: Feels unsteady when walking, but not falling; interferes with normal activities (e.g., school, work).   - SEVERE: Unable to walk without falling, or requires assistance to walk without falling; feels like passing out now.      Mild 5. ONSET:  "When did the dizziness begin?"     Yesterday 6. AGGRAVATING FACTORS: "Does anything make it worse?" (e.g., standing, change in head position)     Standing  7. HEART RATE: "Can you tell me your heart rate?" "How many beats in 15 seconds?"  (Note: not all patients can do this)       Unsure 8. CAUSE: "What do you think is causing the dizziness?"     Unsure  9. RECURRENT SYMPTOM: "Have you had dizziness before?" If Yes, ask: "When was the last time?" "What happened that time?"     Once before, needed medications adjusted 10. OTHER SYMPTOMS: "Do you have any other symptoms?" (e.g., fever, chest pain, vomiting, diarrhea, bleeding)       No  Protocols used: Dizziness - Lightheadedness-A-AH

## 2024-02-11 ENCOUNTER — Ambulatory Visit (HOSPITAL_COMMUNITY): Payer: Medicare PPO | Attending: Cardiovascular Disease | Admitting: Cardiovascular Disease

## 2024-02-11 ENCOUNTER — Ambulatory Visit (HOSPITAL_BASED_OUTPATIENT_CLINIC_OR_DEPARTMENT_OTHER): Payer: Medicare PPO

## 2024-02-11 ENCOUNTER — Encounter: Payer: Self-pay | Admitting: Cardiovascular Disease

## 2024-02-11 VITALS — BP 128/80 | HR 64 | Ht 66.0 in | Wt 180.8 lb

## 2024-02-11 DIAGNOSIS — I350 Nonrheumatic aortic (valve) stenosis: Secondary | ICD-10-CM

## 2024-02-11 DIAGNOSIS — D6869 Other thrombophilia: Secondary | ICD-10-CM | POA: Diagnosis not present

## 2024-02-11 DIAGNOSIS — I5032 Chronic diastolic (congestive) heart failure: Secondary | ICD-10-CM | POA: Insufficient documentation

## 2024-02-11 DIAGNOSIS — Z01810 Encounter for preprocedural cardiovascular examination: Secondary | ICD-10-CM | POA: Insufficient documentation

## 2024-02-11 DIAGNOSIS — I4819 Other persistent atrial fibrillation: Secondary | ICD-10-CM | POA: Diagnosis not present

## 2024-02-11 NOTE — Progress Notes (Signed)
 Cardiology Office Note:    Date:  02/11/2024   ID:  Tracy, James 03-07-1951, MRN [id number]  PCP:  Tracy Emms, NP   Tracy James Cardiologist:  Tracy Bollman, MD     Referring MD: Tracy Emms, NP   Chief Complaint  Patient presents with   Follow-up    Aortic valve disease    History of Present Illness:    Tracy James is a 73 y.o. female presenting for follow-up of aortic stenosis.  The patient has been followed for persistent atrial fibrillation, tachycardia mediated cardiomyopathy, and hypertension.  She has a history of diverticular bleed on oral anticoagulation.  We worked her up for TAVR in 2022 but decided to defer because there was some concern about ovarian masses at the time.  All of this took a benign course and she was not found to have any significant malignancy.  The patient was last seen here in September 2024 at which time she reported she was doing well.  She continues to have minimal symptoms but has had some recent dizziness.  She denies chest pain, shortness of breath, or leg swelling.  She had an echocardiogram this morning that showed peak and mean transvalvular gradients of 53 and 37 mmHg, respectively.  Her dimensionless index is 0.15 and calculated aortic valve area 0.34 cm.   Current Medications: Current Meds  Medication Sig   acetaminophen (TYLENOL) 500 MG tablet Take 500 mg by mouth as needed (for pain.).   apixaban (ELIQUIS) 5 MG TABS tablet Take 1 tablet by mouth twice daily   atorvastatin (LIPITOR) 20 MG tablet Take 1 tablet (20 mg total) by mouth daily.   calcium-vitamin D (OSCAL WITH D) 500-200 MG-UNIT TABS tablet Take 1 tablet by mouth daily.    dofetilide (TIKOSYN) 250 MCG capsule Take 1 capsule by mouth twice daily   furosemide (LASIX) 40 MG tablet Take 1 tablet by mouth once daily   hydroxypropyl methylcellulose / hypromellose (ISOPTO TEARS / GONIOVISC) 2.5 % ophthalmic solution 1 drop as needed for dry eyes.    loratadine (CLARITIN) 10 MG tablet Take 10 mg by mouth daily as needed for allergies.   metoprolol tartrate (LOPRESSOR) 25 MG tablet Take 1 tablet (25 mg total) by mouth 2 (two) times daily.   mometasone (ELOCON) 0.1 % ointment Apply 1 application  topically daily as needed (for skin rash).   nitroGLYCERIN (NITROSTAT) 0.4 MG SL tablet Place 1 tablet (0.4 mg total) under the tongue every 5 (five) minutes as needed for chest pain.   Olopatadine HCl 0.2 % SOLN Apply 1 drop to eye daily. One drop to affected eye once daily   Omega-3 1000 MG CAPS Take 1,000 mg by mouth daily.    VITAMIN D PO Take 1 tablet by mouth daily.     Allergies:   Patient has no known allergies.   ROS:   Please see the history of present illness.    All other systems reviewed and are negative.  EKGs/Labs/Other Studies Reviewed:    The following studies were reviewed today: Cardiac Studies & Procedures   ______________________________________________________________________________________________     ECHOCARDIOGRAM  ECHOCARDIOGRAM COMPLETE 08/27/2023  Narrative ECHOCARDIOGRAM REPORT    Patient Name:   Tracy James Date of Exam: 08/27/2023 Medical Rec #:  [id number]       Height:       66.1 in Accession #:    [id number]      Weight:  182.6 lb Date of Birth:  08/13/1951       BSA:          1.926 m Patient Age:    72 years        BP:           126/87 mmHg Patient Gender: F               HR:           71 bpm. Exam Location:  [address]  Procedure: 2D Echo, 3D Echo, Cardiac Doppler and Color Doppler  Indications:    I48.19 Atrial Fibrillation  History:        Patient has prior history of Echocardiogram examinations, most recent 02/19/2023. Aortic Valve Disease, Signs/Symptoms:Dizziness/Lightheadedness; Risk Factors:Hypertension.  Sonographer:    Clearence Ped RCS Referring Phys: [phone] Angelino Rumery  IMPRESSIONS   1. 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 2. Left ventricular ejection fraction, by estimation, is 60 to 65%. The left ventricle has normal function. The left ventricle has no regional wall motion abnormalities. There is moderate left ventricular hypertrophy. Left ventricular diastolic parameters are consistent with Grade III diastolic dysfunction (restrictive). Elevated left atrial pressure. 3. Right ventricular systolic function is normal. The right ventricular size is normal. Tricuspid regurgitation signal is inadequate for assessing PA pressure. 4. Left atrial size was severely dilated. 5. The mitral valve is normal in structure. Trivial mitral valve regurgitation. No evidence of mitral stenosis. 6. The aortic valve is calcified. Aortic valve regurgitation is trivial. Severe aortic valve stenosis. 7. The inferior vena cava is normal in size with greater than 50% respiratory variability, suggesting right atrial pressure of 3 mmHg.  FINDINGS Left Ventricle: Left ventricular ejection fraction, by estimation, is 60 to 65%. The left ventricle has normal function. The left ventricle has no regional wall motion abnormalities. The left ventricular internal cavity size was normal in size. There is moderate left ventricular hypertrophy. Left ventricular diastolic parameters are consistent with Grade III diastolic dysfunction (restrictive). Elevated left atrial pressure.  Right Ventricle: The right ventricular size is normal. Right ventricular systolic function is normal. Tricuspid regurgitation signal is inadequate for assessing PA pressure. The tricuspid regurgitant velocity is 2.65 m/s, and with an assumed right atrial pressure of 3 mmHg, the estimated right ventricular systolic pressure is 31.1 mmHg.  Left Atrium: Left atrial size was severely dilated.  Right Atrium: Right atrial size was normal in size.  Pericardium: There is no  evidence of pericardial effusion.  Mitral Valve: The mitral valve is normal in structure. Trivial mitral valve regurgitation. No evidence of mitral valve stenosis.  Tricuspid Valve: The tricuspid valve is normal in structure. Tricuspid valve regurgitation is trivial. No evidence of tricuspid stenosis.  Aortic Valve: The aortic valve is calcified. Aortic valve regurgitation is trivial. Aortic regurgitation PHT measures 396 msec. Severe aortic stenosis is present. Aortic valve mean gradient measures 46.0 mmHg. Aortic valve peak gradient measures 92.2 mmHg. Aortic valve area, by VTI measures 0.65 cm.  Pulmonic Valve: The pulmonic valve was normal in structure. Pulmonic valve regurgitation is trivial. No evidence of pulmonic stenosis.  Aorta: The aortic root is normal in size and structure.  Venous: The inferior vena cava is normal in size with greater than 50% respiratory variability, suggesting right atrial pressure of 3 mmHg.  IAS/Shunts: No atrial level shunt detected by color flow  Doppler.  Additional Comments: Functionally bicuspid aortic valve with fusion of right and left coronary cusps; severe AS (mean gradient 46 mmHg; AVA 0.65 cm2; DI 0.21); AS slightly worse compared to previous.   LEFT VENTRICLE PLAX 2D LVIDd:         3.50 cm   Diastology LVIDs:         2.30 cm   LV e' medial:    5.33 cm/s LV PW:         1.40 cm   LV E/e' medial:  26.8 LV IVS:        1.30 cm   LV e' lateral:   8.70 cm/s LVOT diam:     2.00 cm   LV E/e' lateral: 16.4 LV SV:         76 LV SV Index:   40 LVOT Area:     3.14 cm  3D Volume EF: 3D EF:        68 % LV EDV:       142 ml LV ESV:       45 ml LV SV:        97 ml  RIGHT VENTRICLE RV Basal diam:  3.10 cm RV S prime:     13.20 cm/s TAPSE (M-mode): 1.6 cm RVSP:           31.1 mmHg  LEFT ATRIUM              Index        RIGHT ATRIUM           Index LA diam:        4.50 cm  2.34 cm/m   RA Pressure: 3.00 mmHg LA Vol (A2C):   106.0 ml 55.05 ml/m   RA Area:     10.70 cm LA Vol (A4C):   67.6 ml  35.11 ml/m  RA Volume:   21.30 ml  11.06 ml/m LA Biplane Vol: 85.1 ml  44.19 ml/m AORTIC VALVE AV Area (Vmax):    0.54 cm AV Area (Vmean):   0.59 cm AV Area (VTI):     0.65 cm AV Vmax:           480.00 cm/s AV Vmean:          302.000 cm/s AV VTI:            1.180 m AV Peak Grad:      92.2 mmHg AV Mean Grad:      46.0 mmHg LVOT Vmax:         82.20 cm/s LVOT Vmean:        57.000 cm/s LVOT VTI:          0.243 m LVOT/AV VTI ratio: 0.21 AI PHT:            396 msec  AORTA Ao Root diam: 2.80 cm Ao Asc diam:  2.90 cm  MITRAL VALVE                TRICUSPID VALVE TR Peak grad:   28.1 mmHg MV Decel Time:              TR Vmax:        265.00 cm/s MV E velocity: 143.00 cm/s  Estimated RAP:  3.00 mmHg MV A velocity: 64.50 cm/s   RVSP:           31.1 mmHg MV E/A ratio:  2.22 SHUNTS Systemic VTI:  0.24 m Systemic Diam: 2.00 cm  Olga Millers MD Electronically signed by  Olga Millers MD Signature Date/Time: 08/27/2023/3:26:23 PM    Final   TEE  ECHO TEE 01/20/2019  Narrative TRANSESOPHOGEAL ECHO REPORT    Patient Name:   DARITZA BREES Date of Exam: 01/20/2019 Medical Rec #:  [id number]       Height:       66.0 in Accession #:    [id number]      Weight:       173.8 lb Date of Birth:  [birth year]       BSA:          1.88 m Patient Age:    67 years        BP:           123/70 mmHg Patient Gender: F               HR:           130 bpm. Exam Location:  Inpatient   Procedure: Transesophageal Echo, Color Doppler, Cardiac Doppler and Intracardiac Opacification Agent  Indications:     Atrial fibrillation 427.31 / I48.91  History:         Patient has prior history of Echocardiogram examinations, most recent 12/06/2018. Aortic Valve Disease; Signs/Symptoms: Murmur; Risk Factors: Hypertension and Current Smoker.  Sonographer:     Leta Jungling RDCS Referring Phys:  [id number] Evern Bio WEAVER Diagnosing Phys: Weston Brass  MD   Sonographer Comments: Limited TEE to rule out LAA thrombus. Definity used to better visualize LAA.     PROCEDURE: After discussion of the risks and benefits of a TEE, an informed consent was obtained from the patient. Patients was monitored while under deep sedation. Imaged were obtained with the patient in a left lateral decubitus position. The patient's vital signs; including heart rate, blood pressure, and oxygen saturation; remained stable throughout the procedure. The patient developed no complications during the procedure.  IMPRESSIONS   1. The left ventricle has moderate-severely reduced systolic function, with an ejection fraction of 30-35%. The cavity size was normal. 2. Evidence of atrial level shunting detected by color flow Doppler. 3. Small fixed left atrial thrombus on the interior of the left atrial appendage. This was not well seen with 2D echo, however appears present with Definity enhancement (see clips 30-33). 4. No right atrial appendage thrombus seen. 5. Cardioversion not performed, referring provider notified.  FINDINGS Left Ventricle: The left ventricle has moderate-severely reduced systolic function, with an ejection fraction of 30-35%. The cavity size was normal. There is no increase in left ventricular wall thickness. Definity contrast agent was given IV to delineate the left ventricular endocardial borders. Right Ventricle: The right ventricle has normal systolic function. The cavity was normal. There is no increase in right ventricular wall thickness.   Left Atrium: Left atrial size was normal in size. There is a small fixed left atrial thrombus seen adhering to the interior wall of the left atrial appendage. Right Atrium: Right atrial size was normal in size.  Interatrial Septum: Evidence of atrial level shunting detected by color flow Doppler. Pericardium: There is no evidence of pericardial effusion.  AORTIC VALVE AV Vmax:      296.00 cm/s AV  Vmean:     227.000 cm/s AV VTI:       0.587 m AV Peak Grad: 35.0 mmHg AV Mean Grad: 22.0 mmHg   Weston Brass MD Electronically signed by Weston Brass MD Signature Date/Time: 01/20/2019/5:00:27 PM    Final    CT SCANS  CT CORONARY MORPH W/CTA COR  W/SCORE 09/26/2021  Addendum 10/04/2021  6:17 AM ADDENDUM REPORT: 10/04/2021 06:15  ADDENDUM: Extracardiac findings were described separately under dictation for contemporaneously obtained CTA chest, abdomen and pelvis dated 09/26/2021. Please see that report for full description of relevant extracardiac findings.   Electronically Signed By: Trudie Reed M.D. On: 10/04/2021 06:15  Narrative CLINICAL DATA:  Pre-op transcatheter aortic valve replacement (TAVR)  EXAM: Cardiac TAVR CT  TECHNIQUE: The patient was scanned on a Siemens Force 192 slice scanner. A 120 kV retrospective scan was triggered in the descending thoracic aorta at 111 HU's. Gantry rotation speed was 270 msecs and collimation was .9 mm. The 3D data set was reconstructed in 5% intervals of the R-R cycle. Systolic and diastolic phases were analyzed on a dedicated work station using MPR, MIP and VRT modes. The patient received OMNIPAQUE IOHEXOL 350 MG/ML SOLN of contrast.  FINDINGS: Aortic Valve: Bicuspid aortic valve with raphe between right and left coronary cusps. Severely Reduced cusp separation. Severely thickened, mildly calcified aortic valve cusps.  AV calcium score: 408  Virtual Basal Annulus Measurements:  Maximum/Minimum Diameter: 29.6 x 21.3 mm  Perimeter: 80.9 mm  Area: 493 mm2  No significant LVOT calcifications.  Based on these measurements, the annulus would be suitable for a 26 mm Sapien 3 valve.  Sinus of Valsalva Measurements: 29 mm x 25 mm  Sinus of Valsalva Height:  Left: 16.5 mm  Right: 19.7 mm  Aorta: Severe aortic atherosclerosis. Conventional 3 vessel branch pattern of aortic arch. No coarctation of  the aorta.  Sinotubular Junction:  25 mm  Ascending Thoracic Aorta: 28 mm  Aortic Arch:  24 mm  Descending Thoracic Aorta:  21 mm  Coronary Artery Height above Annulus:  Left Main: 12 mm  Right Coronary: 15.4 mm  Coronary Arteries: Normal coronary artery origins.  Optimum Fluoroscopic Angle for Delivery: LAO 1 CAU 1  Slow flowing contrast in left atrial appendage however no definite findings for thrombus seen.  IMPRESSION: 1. Bicuspid aortic valve with raphe between right and left coronary cusps. Severely Reduced cusp separation. Severely thickened, mildly calcified aortic valve cusps.  2. AV calcium score: 408  3. Annulus Area: 493 mm2. No significant LVOT calcifications. The annulus would be suitable for a 26 mm Sapien 3 valve. Recommend Heart Team discussion for TAVR vs. surgical aortic valve replacement.  4.  Sufficient coronary artery height from annulus.  5. Optimum Fluoroscopic Angle for Delivery: LAO 1 CAU 1  6. Slow flowing contrast in left atrial appendage however no definite findings for thrombus seen.  7. Severe aortic atherosclerosis. Conventional 3 vessel branch pattern of aortic arch. No coarctation of the aorta. Normal caliber thoracic aorta.  Electronically Signed: By: Weston Brass M.D. On: 09/26/2021 17:09   CT SCANS  CT CORONARY MORPH W/CTA COR W/SCORE 07/25/2017  Addendum 07/25/2017  5:11 PM ADDENDUM REPORT: 07/25/2017 17:09  CLINICAL DATA:  73 year old female with past medical history of hypertension, tobacco use and heart murmur who presented to the ED with complaints of ongoing chest pain.  EXAM: Cardiac/Coronary  CT  TECHNIQUE: The patient was scanned on a [employer].  FINDINGS: A 100 kV prospective scan was triggered in the descending thoracic aorta at 111 HU's. Axial non-contrast 3 mm slices were carried out through the heart. The data set was analyzed on a dedicated work station and scored using the  Agatson method. Gantry rotation speed was 250 msecs and collimation was .5 mm. 5 mg of iv Metoprolol and 0.8  mg of sl NTG was given. The 3D data set was reconstructed in 5% intervals of the 67-82 % of the R-R cycle. Diastolic phases were analyzed on a dedicated work station using MPR, MIP and VRT modes. The patient received 80 cc of contrast.  Aorta: Normal size. Mild diffuse calcifications in the aortic arch. No dissection.  Aortic Valve: Trileaflet, unusually thickened leaflets and mild calcifications in the non-coronary cusp. Leaflets opening can't be evaluated as only diastolic images were obtained.  Coronary Arteries:  Normal coronary origin.  Right dominance.  RCA is a large dominant artery that gives rise to PDA and PLVB. There is no plaque.  Left main is a large artery that gives rise to LAD and LCX arteries.  LAD is a large vessel that gives rise to two diagonal branches and has no plaque.  LCX is a non-dominant artery that gives rise to one OM1 branch. There is no plaque.  Other findings:  Normal pulmonary vein drainage into the left atrium.  Normal let atrial appendage without a thrombus.  Normal size of the pulmonary artery.  IMPRESSION: 1. Coronary calcium score of 0. This was 0 percentile for age and sex matched control.  2. Normal coronary origin with right dominance.  3. No evidence of CAD.  4. Unusually thickened leaflets of the tricuspid aortic valve mild calcifications in the non-coronary cusp. Leaflets opening can't be evaluated as only diastolic images were obtained. An echocardiogram is recommended for further evaluation.  Tobias Alexander   Electronically Signed By: Tobias Alexander On: 07/25/2017 17:09  Narrative EXAM: OVER-READ INTERPRETATION  CT CHEST  The following report is an over-read performed by radiologist Dr. Arliss [facility] on 07/25/2017. This over-read does not include interpretation of cardiac or  coronary anatomy or pathology. The coronary CTA interpretation by the cardiologist is attached.  COMPARISON:  Chest radiograph of earlier today. Diagnostic chest CT of 07/19/2017.  FINDINGS: Vascular: Normal thoracic aortic caliber. Aortic atherosclerosis. No aortic dissection. No central pulmonary embolism, on this non-dedicated study.  Mediastinum/Nodes: 9 mm right paratracheal node is not pathologic by size criteria. No hilar adenopathy.  Lungs/Pleura: No pleural fluid.  Clear imaged lungs.  Upper Abdomen: Normal imaged portions of the liver, spleen, stomach.  Musculoskeletal: No acute osseous abnormality. Mild thoracic spondylosis.  IMPRESSION: 1. No acute findings in the extracardiac imaged chest. 2.  Aortic Atherosclerosis (ICD10-I70.0).  Electronically Signed: By: Jeronimo Greaves M.D. On: 07/25/2017 16:59     ______________________________________________________________________________________________      EKG:   EKG Interpretation Date/Time:  Monday February 11 2024 11:01:06 EDT Ventricular Rate:  60 PR Interval:    QRS Duration:  84 QT Interval:  490 QTC Calculation: 490 R Axis:   65  Text Interpretation: Normal sinus rhythm with frequent PAC's Left ventricular hypertrophy with repolarization abnormality ( Sokolow-Lyon ) Prolonged QT When compared with ECG of 20-Dec-2023 08:37,PAC's are new, no other significant changes Confirmed by Tracy James [phone]) on 02/11/2024 1:01:31 PM    Recent Labs: 12/13/2023: ALT 19; BUN 18; Creatinine, Ser 1.01; Hemoglobin 12.9; Platelets 275.0; Potassium 4.5; Sodium 141; TSH 1.51 12/20/2023: Magnesium 2.0  Recent Lipid Panel    Component Value Date/Time   CHOL 164 12/13/2023 1438   CHOL 144 10/19/2021 1036   TRIG 152.0 (H) 12/13/2023 1438   HDL 50.30 12/13/2023 1438   HDL 40 10/19/2021 1036   CHOLHDL 3 12/13/2023 1438   VLDL 30.4 12/13/2023 1438   LDLCALC 83 12/13/2023 1438   LDLCALC 81  10/19/2021 1036   LDLCALC 95  09/26/2017 0910     Risk Assessment/Calculations:    CHA2DS2-VASc Score = 5   This indicates a 7.2% annual risk of stroke. The patient's score is based upon: CHF History: 1 HTN History: 1 Diabetes History: 0 Stroke History: 0 Vascular Disease History: 1 (aoritc atherosclerosis) Age Score: 1 Gender Score: 1               Physical Exam:    VS:  BP 128/80   Pulse 64   Ht 5\' 6"  (1.676 m)   Wt 180 lb 12.8 oz (82 kg)   SpO2 96%   BMI 29.18 kg/m     Wt Readings from Last 3 Encounters:  02/11/24 180 lb 12.8 oz (82 kg)  12/20/23 184 lb (83.5 kg)  12/13/23 184 lb 3.2 oz (83.6 kg)     GEN:  Well nourished, well developed in no acute distress HEENT: Normal NECK: No JVD; No carotid bruits LYMPHATICS: No lymphadenopathy CARDIAC: RRR, 3/6 harsh crescendo decrescendo murmur at the right upper sternal border RESPIRATORY:  Clear to auscultation without rales, wheezing or rhonchi  ABDOMEN: Soft, non-tender, non-distended MUSCULOSKELETAL:  No edema; No deformity  SKIN: Warm and dry NEUROLOGIC:  Alert and oriented x 3 PSYCHIATRIC:  Normal affect   Assessment & Plan Nonrheumatic aortic valve stenosis The patient has severe aortic stenosis with borderline symptoms.  At a low workload she appears to be asymptomatic.  She has some occasional dizziness but no problems with her day-to-day activities.  We discussed the natural history of aortic stenosis today.  I reviewed her serial echo studies.  She has developed significant LVH with restrictive diastolic filling on her most recent echo in September.  An echocardiogram this morning is reviewed as above with a calculated aortic valve area less than 0.4 cm.  Formal interpretation is pending, but it appears she has had progressive changes of aortic stenosis.  We discussed the pros and cons of continued surveillance versus intervention with TAVR.  After shared decision making conversation, we decided to proceed with further evaluation to  update her CTA studies and perform cardiac catheterization as part of her preoperative assessment.  I think in the setting of LVH and diastolic dysfunction with severe aortic stenosis, it is appropriate to intervene on her aortic valve rather than allow her heart to have further hypertrophy placing her at risk for heart failure.  Will order a CTA of the heart as well as a CTA of the chest, abdomen, and pelvis.  Will order a cardiac catheterization to evaluate her coronary anatomy.  Once her studies are completed, her case will be reviewed with her multidisciplinary heart valve team and she will be sent for formal cardiac surgical consultation as part of a multidisciplinary approach to her care. Pre-procedural cardiovascular examination Check preprocedure labs.     Informed Consent   Shared Decision Making/Informed Consent The risks [stroke (1 in 1000), death (1 in 1000), kidney failure [usually temporary] (1 in 500), bleeding (1 in 200), allergic reaction [possibly serious] (1 in 200)], benefits (diagnostic support and management of coronary artery disease) and alternatives of a cardiac catheterization were discussed in detail with Ms. Ratterree and she is willing to proceed.       Medication Adjustments/Labs and Tests Ordered: Current medicines are reviewed at length with the patient today.  Concerns regarding medicines are outlined above.  Orders Placed This Encounter  Procedures   Basic metabolic panel   CBC   EKG  12-Lead   No orders of the defined types were placed in this encounter.   Patient Instructions  Lab Work: CBC, BMET today If you have labs (blood work) drawn today and your tests are completely normal, you will receive your results only by: MyChart Message (if you have MyChart) OR A paper copy in the mail If you have any lab test that is abnormal or we need to change your treatment, we will call you to review the results.   Testing/Procedures: L Heart Catheterization Your  physician has requested that you have a cardiac catheterization. Cardiac catheterization is used to diagnose and/or treat various heart conditions. Doctors may recommend this procedure for a number of different reasons. The most common reason is to evaluate chest pain. Chest pain can be a symptom of coronary artery disease (CAD), and cardiac catheterization can show whether plaque is narrowing or blocking your heart's arteries. This procedure is also used to evaluate the valves, as well as measure the blood flow and oxygen levels in different parts of your heart. For further information please visit [email or web address] Please follow instruction sheet, as given.  TAVR CT's and surgical evaluation (you will be called to schedule)  Follow-Up: At [facility], you and your health needs are our priority.  As part of our continuing mission to provide you with exceptional heart care, we have created designated Provider Care Teams.  These Care Teams include your primary Cardiologist (physician) and [facility] (APPs -  Physician Assistants and Nurse Practitioners) who all work together to provide you with the care you need, when you need it.  Your next appointment:   Structural Team will follow-up  Provider:   Tonny Bollman, MD     Other Instructions       Cardiac/Peripheral Catheterization   You are scheduled for a Cardiac Catheterization on Monday, March 31 with Dr. Tonny James.  1. Please arrive at the [facility] (Main Entrance A) at [facility]: [address] [place], [place] [address] at 7:00 AM (This time is two hour(s) before your procedure to ensure your preparation).   Free valet parking service is available. You will check in at ADMITTING. The support person will be asked to wait in the waiting room.  It is OK to have someone drop you off and come back when you are ready to be discharged.        Special note: Every effort is made to have your  procedure done on time. Please understand that emergencies sometimes delay scheduled procedures.  2. Diet: Do not eat solid foods after midnight.  You may have clear liquids until 5 AM the day of the procedure.  3. Labs: TODAY  4. Medication instructions in preparation for your procedure:   Contrast Allergy: No  DO NOT TAKE Eliquis for 2 days prior to procedure and hold day of procedure (last dose Fri 2/28, resume Tues 4/1)  DO NOT TAKE Furosemide the morning of procedure  On the morning of your procedure, take Aspirin 81 mg and any morning medicines NOT listed above.  You may use sips of water.  5. Plan to go home the same day, you will only stay overnight if medically necessary. 6. You MUST have a responsible adult to drive you home. 7. An adult MUST be with you the first 24 hours after you arrive home. 8. Bring a current list of your medications, and the last time and date medication taken. 9. Bring ID and current insurance  cards. 10.Please wear clothes that are easy to get on and off and wear slip-on shoes.  Thank you for allowing [place] to care for you!   -- [facility] services    Signed, Tracy Bollman, MD  02/11/2024 1:02 PM    [facility]

## 2024-02-11 NOTE — H&P (View-Only) (Signed)
 Cardiology Office Note:    Date:  02/11/2024   ID:  Tracy James, Tracy James 03-07-1951, MRN [id number]  PCP:  Tracy Emms, NP   Tracy James Cardiologist:  Tracy Bollman, MD     Referring MD: Tracy Emms, NP   Chief Complaint  Patient presents with   Follow-up    Aortic valve disease    History of Present Illness:    Tracy James is a 73 y.o. female presenting for follow-up of aortic stenosis.  The patient has been followed for persistent atrial fibrillation, tachycardia mediated cardiomyopathy, and hypertension.  She has a history of diverticular bleed on oral anticoagulation.  We worked her up for TAVR in 2022 but decided to defer because there was some concern about ovarian masses at the time.  All of this took a benign course and she was not found to have any significant malignancy.  The patient was last seen here in September 2024 at which time she reported she was doing well.  She continues to have minimal symptoms but has had some recent dizziness.  She denies chest pain, shortness of breath, or leg swelling.  She had an echocardiogram this morning that showed peak and mean transvalvular gradients of 53 and 37 mmHg, respectively.  Her dimensionless index is 0.15 and calculated aortic valve area 0.34 cm.   Current Medications: Current Meds  Medication Sig   acetaminophen (TYLENOL) 500 MG tablet Take 500 mg by mouth as needed (for pain.).   apixaban (ELIQUIS) 5 MG TABS tablet Take 1 tablet by mouth twice daily   atorvastatin (LIPITOR) 20 MG tablet Take 1 tablet (20 mg total) by mouth daily.   calcium-vitamin D (OSCAL WITH D) 500-200 MG-UNIT TABS tablet Take 1 tablet by mouth daily.    dofetilide (TIKOSYN) 250 MCG capsule Take 1 capsule by mouth twice daily   furosemide (LASIX) 40 MG tablet Take 1 tablet by mouth once daily   hydroxypropyl methylcellulose / hypromellose (ISOPTO TEARS / GONIOVISC) 2.5 % ophthalmic solution 1 drop as needed for dry eyes.    loratadine (CLARITIN) 10 MG tablet Take 10 mg by mouth daily as needed for allergies.   metoprolol tartrate (LOPRESSOR) 25 MG tablet Take 1 tablet (25 mg total) by mouth 2 (two) times daily.   mometasone (ELOCON) 0.1 % ointment Apply 1 application  topically daily as needed (for skin rash).   nitroGLYCERIN (NITROSTAT) 0.4 MG SL tablet Place 1 tablet (0.4 mg total) under the tongue every 5 (five) minutes as needed for chest pain.   Olopatadine HCl 0.2 % SOLN Apply 1 drop to eye daily. One drop to affected eye once daily   Omega-3 1000 MG CAPS Take 1,000 mg by mouth daily.    VITAMIN D PO Take 1 tablet by mouth daily.     Allergies:   Patient has no known allergies.   ROS:   Please see the history of present illness.    All other systems reviewed and are negative.  EKGs/Labs/Other Studies Reviewed:    The following studies were reviewed today: Cardiac Studies & Procedures   ______________________________________________________________________________________________     ECHOCARDIOGRAM  ECHOCARDIOGRAM COMPLETE 08/27/2023  Narrative ECHOCARDIOGRAM REPORT    Patient Name:   Tracy James Date of Exam: 08/27/2023 Medical Rec #:  [id number]       Height:       66.1 in Accession #:    [id number]      Weight:  182.6 lb Date of Birth:  08/13/1951       BSA:          1.926 m Patient Age:    72 years        BP:           126/87 mmHg Patient Gender: F               HR:           71 bpm. Exam Location:  [address]  Procedure: 2D Echo, 3D Echo, Cardiac Doppler and Color Doppler  Indications:    I48.19 Atrial Fibrillation  History:        Patient has prior history of Echocardiogram examinations, most recent 02/19/2023. Aortic Valve Disease, Signs/Symptoms:Dizziness/Lightheadedness; Risk Factors:Hypertension.  Sonographer:    Clearence Ped RCS Referring Phys: [phone] Angelino Rumery  IMPRESSIONS   1. 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 2. Left ventricular ejection fraction, by estimation, is 60 to 65%. The left ventricle has normal function. The left ventricle has no regional wall motion abnormalities. There is moderate left ventricular hypertrophy. Left ventricular diastolic parameters are consistent with Grade III diastolic dysfunction (restrictive). Elevated left atrial pressure. 3. Right ventricular systolic function is normal. The right ventricular size is normal. Tricuspid regurgitation signal is inadequate for assessing PA pressure. 4. Left atrial size was severely dilated. 5. The mitral valve is normal in structure. Trivial mitral valve regurgitation. No evidence of mitral stenosis. 6. The aortic valve is calcified. Aortic valve regurgitation is trivial. Severe aortic valve stenosis. 7. The inferior vena cava is normal in size with greater than 50% respiratory variability, suggesting right atrial pressure of 3 mmHg.  FINDINGS Left Ventricle: Left ventricular ejection fraction, by estimation, is 60 to 65%. The left ventricle has normal function. The left ventricle has no regional wall motion abnormalities. The left ventricular internal cavity size was normal in size. There is moderate left ventricular hypertrophy. Left ventricular diastolic parameters are consistent with Grade III diastolic dysfunction (restrictive). Elevated left atrial pressure.  Right Ventricle: The right ventricular size is normal. Right ventricular systolic function is normal. Tricuspid regurgitation signal is inadequate for assessing PA pressure. The tricuspid regurgitant velocity is 2.65 m/s, and with an assumed right atrial pressure of 3 mmHg, the estimated right ventricular systolic pressure is 31.1 mmHg.  Left Atrium: Left atrial size was severely dilated.  Right Atrium: Right atrial size was normal in size.  Pericardium: There is no  evidence of pericardial effusion.  Mitral Valve: The mitral valve is normal in structure. Trivial mitral valve regurgitation. No evidence of mitral valve stenosis.  Tricuspid Valve: The tricuspid valve is normal in structure. Tricuspid valve regurgitation is trivial. No evidence of tricuspid stenosis.  Aortic Valve: The aortic valve is calcified. Aortic valve regurgitation is trivial. Aortic regurgitation PHT measures 396 msec. Severe aortic stenosis is present. Aortic valve mean gradient measures 46.0 mmHg. Aortic valve peak gradient measures 92.2 mmHg. Aortic valve area, by VTI measures 0.65 cm.  Pulmonic Valve: The pulmonic valve was normal in structure. Pulmonic valve regurgitation is trivial. No evidence of pulmonic stenosis.  Aorta: The aortic root is normal in size and structure.  Venous: The inferior vena cava is normal in size with greater than 50% respiratory variability, suggesting right atrial pressure of 3 mmHg.  IAS/Shunts: No atrial level shunt detected by color flow  Doppler.  Additional Comments: Functionally bicuspid aortic valve with fusion of right and left coronary cusps; severe AS (mean gradient 46 mmHg; AVA 0.65 cm2; DI 0.21); AS slightly worse compared to previous.   LEFT VENTRICLE PLAX 2D LVIDd:         3.50 cm   Diastology LVIDs:         2.30 cm   LV e' medial:    5.33 cm/s LV PW:         1.40 cm   LV E/e' medial:  26.8 LV IVS:        1.30 cm   LV e' lateral:   8.70 cm/s LVOT diam:     2.00 cm   LV E/e' lateral: 16.4 LV SV:         76 LV SV Index:   40 LVOT Area:     3.14 cm  3D Volume EF: 3D EF:        68 % LV EDV:       142 ml LV ESV:       45 ml LV SV:        97 ml  RIGHT VENTRICLE RV Basal diam:  3.10 cm RV S prime:     13.20 cm/s TAPSE (M-mode): 1.6 cm RVSP:           31.1 mmHg  LEFT ATRIUM              Index        RIGHT ATRIUM           Index LA diam:        4.50 cm  2.34 cm/m   RA Pressure: 3.00 mmHg LA Vol (A2C):   106.0 ml 55.05 ml/m   RA Area:     10.70 cm LA Vol (A4C):   67.6 ml  35.11 ml/m  RA Volume:   21.30 ml  11.06 ml/m LA Biplane Vol: 85.1 ml  44.19 ml/m AORTIC VALVE AV Area (Vmax):    0.54 cm AV Area (Vmean):   0.59 cm AV Area (VTI):     0.65 cm AV Vmax:           480.00 cm/s AV Vmean:          302.000 cm/s AV VTI:            1.180 m AV Peak Grad:      92.2 mmHg AV Mean Grad:      46.0 mmHg LVOT Vmax:         82.20 cm/s LVOT Vmean:        57.000 cm/s LVOT VTI:          0.243 m LVOT/AV VTI ratio: 0.21 AI PHT:            396 msec  AORTA Ao Root diam: 2.80 cm Ao Asc diam:  2.90 cm  MITRAL VALVE                TRICUSPID VALVE TR Peak grad:   28.1 mmHg MV Decel Time:              TR Vmax:        265.00 cm/s MV E velocity: 143.00 cm/s  Estimated RAP:  3.00 mmHg MV A velocity: 64.50 cm/s   RVSP:           31.1 mmHg MV E/A ratio:  2.22 SHUNTS Systemic VTI:  0.24 m Systemic Diam: 2.00 cm  Olga Millers MD Electronically signed by  Olga Millers MD Signature Date/Time: 08/27/2023/3:26:23 PM    Final   TEE  ECHO TEE 01/20/2019  Narrative TRANSESOPHOGEAL ECHO REPORT    Patient Name:   DARITZA BREES Date of Exam: 01/20/2019 Medical Rec #:  [id number]       Height:       66.0 in Accession #:    [id number]      Weight:       173.8 lb Date of Birth:  [birth year]       BSA:          1.88 m Patient Age:    67 years        BP:           123/70 mmHg Patient Gender: F               HR:           130 bpm. Exam Location:  Inpatient   Procedure: Transesophageal Echo, Color Doppler, Cardiac Doppler and Intracardiac Opacification Agent  Indications:     Atrial fibrillation 427.31 / I48.91  History:         Patient has prior history of Echocardiogram examinations, most recent 12/06/2018. Aortic Valve Disease; Signs/Symptoms: Murmur; Risk Factors: Hypertension and Current Smoker.  Sonographer:     Leta Jungling RDCS Referring Phys:  [id number] Evern Bio WEAVER Diagnosing Phys: Weston Brass  MD   Sonographer Comments: Limited TEE to rule out LAA thrombus. Definity used to better visualize LAA.     PROCEDURE: After discussion of the risks and benefits of a TEE, an informed consent was obtained from the patient. Patients was monitored while under deep sedation. Imaged were obtained with the patient in a left lateral decubitus position. The patient's vital signs; including heart rate, blood pressure, and oxygen saturation; remained stable throughout the procedure. The patient developed no complications during the procedure.  IMPRESSIONS   1. The left ventricle has moderate-severely reduced systolic function, with an ejection fraction of 30-35%. The cavity size was normal. 2. Evidence of atrial level shunting detected by color flow Doppler. 3. Small fixed left atrial thrombus on the interior of the left atrial appendage. This was not well seen with 2D echo, however appears present with Definity enhancement (see clips 30-33). 4. No right atrial appendage thrombus seen. 5. Cardioversion not performed, referring provider notified.  FINDINGS Left Ventricle: The left ventricle has moderate-severely reduced systolic function, with an ejection fraction of 30-35%. The cavity size was normal. There is no increase in left ventricular wall thickness. Definity contrast agent was given IV to delineate the left ventricular endocardial borders. Right Ventricle: The right ventricle has normal systolic function. The cavity was normal. There is no increase in right ventricular wall thickness.   Left Atrium: Left atrial size was normal in size. There is a small fixed left atrial thrombus seen adhering to the interior wall of the left atrial appendage. Right Atrium: Right atrial size was normal in size.  Interatrial Septum: Evidence of atrial level shunting detected by color flow Doppler. Pericardium: There is no evidence of pericardial effusion.  AORTIC VALVE AV Vmax:      296.00 cm/s AV  Vmean:     227.000 cm/s AV VTI:       0.587 m AV Peak Grad: 35.0 mmHg AV Mean Grad: 22.0 mmHg   Weston Brass MD Electronically signed by Weston Brass MD Signature Date/Time: 01/20/2019/5:00:27 PM    Final    CT SCANS  CT CORONARY MORPH W/CTA COR  W/SCORE 09/26/2021  Addendum 10/04/2021  6:17 AM ADDENDUM REPORT: 10/04/2021 06:15  ADDENDUM: Extracardiac findings were described separately under dictation for contemporaneously obtained CTA chest, abdomen and pelvis dated 09/26/2021. Please see that report for full description of relevant extracardiac findings.   Electronically Signed By: Trudie Reed M.D. On: 10/04/2021 06:15  Narrative CLINICAL DATA:  Pre-op transcatheter aortic valve replacement (TAVR)  EXAM: Cardiac TAVR CT  TECHNIQUE: The patient was scanned on a Siemens Force 192 slice scanner. A 120 kV retrospective scan was triggered in the descending thoracic aorta at 111 HU's. Gantry rotation speed was 270 msecs and collimation was .9 mm. The 3D data set was reconstructed in 5% intervals of the R-R cycle. Systolic and diastolic phases were analyzed on a dedicated work station using MPR, MIP and VRT modes. The patient received OMNIPAQUE IOHEXOL 350 MG/ML SOLN of contrast.  FINDINGS: Aortic Valve: Bicuspid aortic valve with raphe between right and left coronary cusps. Severely Reduced cusp separation. Severely thickened, mildly calcified aortic valve cusps.  AV calcium score: 408  Virtual Basal Annulus Measurements:  Maximum/Minimum Diameter: 29.6 x 21.3 mm  Perimeter: 80.9 mm  Area: 493 mm2  No significant LVOT calcifications.  Based on these measurements, the annulus would be suitable for a 26 mm Sapien 3 valve.  Sinus of Valsalva Measurements: 29 mm x 25 mm  Sinus of Valsalva Height:  Left: 16.5 mm  Right: 19.7 mm  Aorta: Severe aortic atherosclerosis. Conventional 3 vessel branch pattern of aortic arch. No coarctation of  the aorta.  Sinotubular Junction:  25 mm  Ascending Thoracic Aorta: 28 mm  Aortic Arch:  24 mm  Descending Thoracic Aorta:  21 mm  Coronary Artery Height above Annulus:  Left Main: 12 mm  Right Coronary: 15.4 mm  Coronary Arteries: Normal coronary artery origins.  Optimum Fluoroscopic Angle for Delivery: LAO 1 CAU 1  Slow flowing contrast in left atrial appendage however no definite findings for thrombus seen.  IMPRESSION: 1. Bicuspid aortic valve with raphe between right and left coronary cusps. Severely Reduced cusp separation. Severely thickened, mildly calcified aortic valve cusps.  2. AV calcium score: 408  3. Annulus Area: 493 mm2. No significant LVOT calcifications. The annulus would be suitable for a 26 mm Sapien 3 valve. Recommend Heart Team discussion for TAVR vs. surgical aortic valve replacement.  4.  Sufficient coronary artery height from annulus.  5. Optimum Fluoroscopic Angle for Delivery: LAO 1 CAU 1  6. Slow flowing contrast in left atrial appendage however no definite findings for thrombus seen.  7. Severe aortic atherosclerosis. Conventional 3 vessel branch pattern of aortic arch. No coarctation of the aorta. Normal caliber thoracic aorta.  Electronically Signed: By: Weston Brass M.D. On: 09/26/2021 17:09   CT SCANS  CT CORONARY MORPH W/CTA COR W/SCORE 07/25/2017  Addendum 07/25/2017  5:11 PM ADDENDUM REPORT: 07/25/2017 17:09  CLINICAL DATA:  73 year old female with past medical history of hypertension, tobacco use and heart murmur who presented to the ED with complaints of ongoing chest pain.  EXAM: Cardiac/Coronary  CT  TECHNIQUE: The patient was scanned on a [employer].  FINDINGS: A 100 kV prospective scan was triggered in the descending thoracic aorta at 111 HU's. Axial non-contrast 3 mm slices were carried out through the heart. The data set was analyzed on a dedicated work station and scored using the  Agatson method. Gantry rotation speed was 250 msecs and collimation was .5 mm. 5 mg of iv Metoprolol and 0.8  mg of sl NTG was given. The 3D data set was reconstructed in 5% intervals of the 67-82 % of the R-R cycle. Diastolic phases were analyzed on a dedicated work station using MPR, MIP and VRT modes. The patient received 80 cc of contrast.  Aorta: Normal size. Mild diffuse calcifications in the aortic arch. No dissection.  Aortic Valve: Trileaflet, unusually thickened leaflets and mild calcifications in the non-coronary cusp. Leaflets opening can't be evaluated as only diastolic images were obtained.  Coronary Arteries:  Normal coronary origin.  Right dominance.  RCA is a large dominant artery that gives rise to PDA and PLVB. There is no plaque.  Left main is a large artery that gives rise to LAD and LCX arteries.  LAD is a large vessel that gives rise to two diagonal branches and has no plaque.  LCX is a non-dominant artery that gives rise to one OM1 branch. There is no plaque.  Other findings:  Normal pulmonary vein drainage into the left atrium.  Normal let atrial appendage without a thrombus.  Normal size of the pulmonary artery.  IMPRESSION: 1. Coronary calcium score of 0. This was 0 percentile for age and sex matched control.  2. Normal coronary origin with right dominance.  3. No evidence of CAD.  4. Unusually thickened leaflets of the tricuspid aortic valve mild calcifications in the non-coronary cusp. Leaflets opening can't be evaluated as only diastolic images were obtained. An echocardiogram is recommended for further evaluation.  Tobias Alexander   Electronically Signed By: Tobias Alexander On: 07/25/2017 17:09  Narrative EXAM: OVER-READ INTERPRETATION  CT CHEST  The following report is an over-read performed by radiologist Dr. Arliss [facility] on 07/25/2017. This over-read does not include interpretation of cardiac or  coronary anatomy or pathology. The coronary CTA interpretation by the cardiologist is attached.  COMPARISON:  Chest radiograph of earlier today. Diagnostic chest CT of 07/19/2017.  FINDINGS: Vascular: Normal thoracic aortic caliber. Aortic atherosclerosis. No aortic dissection. No central pulmonary embolism, on this non-dedicated study.  Mediastinum/Nodes: 9 mm right paratracheal node is not pathologic by size criteria. No hilar adenopathy.  Lungs/Pleura: No pleural fluid.  Clear imaged lungs.  Upper Abdomen: Normal imaged portions of the liver, spleen, stomach.  Musculoskeletal: No acute osseous abnormality. Mild thoracic spondylosis.  IMPRESSION: 1. No acute findings in the extracardiac imaged chest. 2.  Aortic Atherosclerosis (ICD10-I70.0).  Electronically Signed: By: Jeronimo Greaves M.D. On: 07/25/2017 16:59     ______________________________________________________________________________________________      EKG:   EKG Interpretation Date/Time:  Monday February 11 2024 11:01:06 EDT Ventricular Rate:  60 PR Interval:    QRS Duration:  84 QT Interval:  490 QTC Calculation: 490 R Axis:   65  Text Interpretation: Normal sinus rhythm with frequent PAC's Left ventricular hypertrophy with repolarization abnormality ( Sokolow-Lyon ) Prolonged QT When compared with ECG of 20-Dec-2023 08:37,PAC's are new, no other significant changes Confirmed by Tracy James [phone]) on 02/11/2024 1:01:31 PM    Recent Labs: 12/13/2023: ALT 19; BUN 18; Creatinine, Ser 1.01; Hemoglobin 12.9; Platelets 275.0; Potassium 4.5; Sodium 141; TSH 1.51 12/20/2023: Magnesium 2.0  Recent Lipid Panel    Component Value Date/Time   CHOL 164 12/13/2023 1438   CHOL 144 10/19/2021 1036   TRIG 152.0 (H) 12/13/2023 1438   HDL 50.30 12/13/2023 1438   HDL 40 10/19/2021 1036   CHOLHDL 3 12/13/2023 1438   VLDL 30.4 12/13/2023 1438   LDLCALC 83 12/13/2023 1438   LDLCALC 81  10/19/2021 1036   LDLCALC 95  09/26/2017 0910     Risk Assessment/Calculations:    CHA2DS2-VASc Score = 5   This indicates a 7.2% annual risk of stroke. The patient's score is based upon: CHF History: 1 HTN History: 1 Diabetes History: 0 Stroke History: 0 Vascular Disease History: 1 (aoritc atherosclerosis) Age Score: 1 Gender Score: 1               Physical Exam:    VS:  BP 128/80   Pulse 64   Ht 5\' 6"  (1.676 m)   Wt 180 lb 12.8 oz (82 kg)   SpO2 96%   BMI 29.18 kg/m     Wt Readings from Last 3 Encounters:  02/11/24 180 lb 12.8 oz (82 kg)  12/20/23 184 lb (83.5 kg)  12/13/23 184 lb 3.2 oz (83.6 kg)     GEN:  Well nourished, well developed in no acute distress HEENT: Normal NECK: No JVD; No carotid bruits LYMPHATICS: No lymphadenopathy CARDIAC: RRR, 3/6 harsh crescendo decrescendo murmur at the right upper sternal border RESPIRATORY:  Clear to auscultation without rales, wheezing or rhonchi  ABDOMEN: Soft, non-tender, non-distended MUSCULOSKELETAL:  No edema; No deformity  SKIN: Warm and dry NEUROLOGIC:  Alert and oriented x 3 PSYCHIATRIC:  Normal affect   Assessment & Plan Nonrheumatic aortic valve stenosis The patient has severe aortic stenosis with borderline symptoms.  At a low workload she appears to be asymptomatic.  She has some occasional dizziness but no problems with her day-to-day activities.  We discussed the natural history of aortic stenosis today.  I reviewed her serial echo studies.  She has developed significant LVH with restrictive diastolic filling on her most recent echo in September.  An echocardiogram this morning is reviewed as above with a calculated aortic valve area less than 0.4 cm.  Formal interpretation is pending, but it appears she has had progressive changes of aortic stenosis.  We discussed the pros and cons of continued surveillance versus intervention with TAVR.  After shared decision making conversation, we decided to proceed with further evaluation to  update her CTA studies and perform cardiac catheterization as part of her preoperative assessment.  I think in the setting of LVH and diastolic dysfunction with severe aortic stenosis, it is appropriate to intervene on her aortic valve rather than allow her heart to have further hypertrophy placing her at risk for heart failure.  Will order a CTA of the heart as well as a CTA of the chest, abdomen, and pelvis.  Will order a cardiac catheterization to evaluate her coronary anatomy.  Once her studies are completed, her case will be reviewed with her multidisciplinary heart valve team and she will be sent for formal cardiac surgical consultation as part of a multidisciplinary approach to her care. Pre-procedural cardiovascular examination Check preprocedure labs.     Informed Consent   Shared Decision Making/Informed Consent The risks [stroke (1 in 1000), death (1 in 1000), kidney failure [usually temporary] (1 in 500), bleeding (1 in 200), allergic reaction [possibly serious] (1 in 200)], benefits (diagnostic support and management of coronary artery disease) and alternatives of a cardiac catheterization were discussed in detail with Ms. Ratterree and she is willing to proceed.       Medication Adjustments/Labs and Tests Ordered: Current medicines are reviewed at length with the patient today.  Concerns regarding medicines are outlined above.  Orders Placed This Encounter  Procedures   Basic metabolic panel   CBC   EKG  12-Lead   No orders of the defined types were placed in this encounter.   Patient Instructions  Lab Work: CBC, BMET today If you have labs (blood work) drawn today and your tests are completely normal, you will receive your results only by: MyChart Message (if you have MyChart) OR A paper copy in the mail If you have any lab test that is abnormal or we need to change your treatment, we will call you to review the results.   Testing/Procedures: L Heart Catheterization Your  physician has requested that you have a cardiac catheterization. Cardiac catheterization is used to diagnose and/or treat various heart conditions. Doctors may recommend this procedure for a number of different reasons. The most common reason is to evaluate chest pain. Chest pain can be a symptom of coronary artery disease (CAD), and cardiac catheterization can show whether plaque is narrowing or blocking your heart's arteries. This procedure is also used to evaluate the valves, as well as measure the blood flow and oxygen levels in different parts of your heart. For further information please visit [email or web address] Please follow instruction sheet, as given.  TAVR CT's and surgical evaluation (you will be called to schedule)  Follow-Up: At [facility], you and your health needs are our priority.  As part of our continuing mission to provide you with exceptional heart care, we have created designated Provider Care Teams.  These Care Teams include your primary Cardiologist (physician) and [facility] (APPs -  Physician Assistants and Nurse Practitioners) who all work together to provide you with the care you need, when you need it.  Your next appointment:   Structural Team will follow-up  Provider:   Tonny Bollman, MD     Other Instructions       Cardiac/Peripheral Catheterization   You are scheduled for a Cardiac Catheterization on Monday, March 31 with Dr. Tonny James.  1. Please arrive at the [facility] (Main Entrance A) at [facility]: [address] [place], [place] [address] at 7:00 AM (This time is two hour(s) before your procedure to ensure your preparation).   Free valet parking service is available. You will check in at ADMITTING. The support person will be asked to wait in the waiting room.  It is OK to have someone drop you off and come back when you are ready to be discharged.        Special note: Every effort is made to have your  procedure done on time. Please understand that emergencies sometimes delay scheduled procedures.  2. Diet: Do not eat solid foods after midnight.  You may have clear liquids until 5 AM the day of the procedure.  3. Labs: TODAY  4. Medication instructions in preparation for your procedure:   Contrast Allergy: No  DO NOT TAKE Eliquis for 2 days prior to procedure and hold day of procedure (last dose Fri 2/28, resume Tues 4/1)  DO NOT TAKE Furosemide the morning of procedure  On the morning of your procedure, take Aspirin 81 mg and any morning medicines NOT listed above.  You may use sips of water.  5. Plan to go home the same day, you will only stay overnight if medically necessary. 6. You MUST have a responsible adult to drive you home. 7. An adult MUST be with you the first 24 hours after you arrive home. 8. Bring a current list of your medications, and the last time and date medication taken. 9. Bring ID and current insurance  cards. 10.Please wear clothes that are easy to get on and off and wear slip-on shoes.  Thank you for allowing [place] to care for you!   -- [facility] Invasive Cardiovascular services    Signed, Tracy Bollman, MD  02/11/2024 1:02 PM    [facility]

## 2024-02-11 NOTE — Patient Instructions (Addendum)
 Lab Work: CBC, BMET today If you have labs (blood work) drawn today and your tests are completely normal, you will receive your results only by: MyChart Message (if you have MyChart) OR A paper copy in the mail If you have any lab test that is abnormal or we need to change your treatment, we will call you to review the results.   Testing/Procedures: L Heart Catheterization Your physician has requested that you have a cardiac catheterization. Cardiac catheterization is used to diagnose and/or treat various heart conditions. Doctors may recommend this procedure for a number of different reasons. The most common reason is to evaluate chest pain. Chest pain can be a symptom of coronary artery disease (CAD), and cardiac catheterization can show whether plaque is narrowing or blocking your heart's arteries. This procedure is also used to evaluate the valves, as well as measure the blood flow and oxygen levels in different parts of your heart. For further information please visit [email or web address] Please follow instruction sheet, as given.  TAVR CT's and surgical evaluation (you will be called to schedule)  Follow-Up: At [facility], you and your health needs are our priority.  As part of our continuing mission to provide you with exceptional heart care, we have created designated Provider Care Teams.  These Care Teams include your primary Cardiologist (physician) and Advanced Practice Providers (APPs -  Physician Assistants and Nurse Practitioners) who all work together to provide you with the care you need, when you need it.  Your next appointment:   Structural Team will follow-up  Provider:   Tonny Bollman, MD     Other Instructions       Cardiac/Peripheral Catheterization   You are scheduled for a Cardiac Catheterization on Monday, March 31 with Dr. Tonny Bollman.  1. Please arrive at the [facility] (Main Entrance A) at [facility]: [address]  [place], [place] [address] at 7:00 AM (This time is two hour(s) before your procedure to ensure your preparation).   Free valet parking service is available. You will check in at ADMITTING. The support person will be asked to wait in the waiting room.  It is OK to have someone drop you off and come back when you are ready to be discharged.        Special note: Every effort is made to have your procedure done on time. Please understand that emergencies sometimes delay scheduled procedures.  2. Diet: Do not eat solid foods after midnight.  You may have clear liquids until 5 AM the day of the procedure.  3. Labs: TODAY  4. Medication instructions in preparation for your procedure:   Contrast Allergy: No  DO NOT TAKE Eliquis for 2 days prior to procedure and hold day of procedure (last dose Fri 2/28, resume Tues 4/1)  DO NOT TAKE Furosemide the morning of procedure  On the morning of your procedure, take Aspirin 81 mg and any morning medicines NOT listed above.  You may use sips of water.  5. Plan to go home the same day, you will only stay overnight if medically necessary. 6. You MUST have a responsible adult to drive you home. 7. An adult MUST be with you the first 24 hours after you arrive home. 8. Bring a current list of your medications, and the last time and date medication taken. 9. Bring ID and current insurance cards. 10.Please wear clothes that are easy to get on and off and wear slip-on shoes.  Thank you for  allowing [place] to care for you!   -- [facility] services

## 2024-02-11 NOTE — Assessment & Plan Note (Signed)
 The patient has severe aortic stenosis with borderline symptoms.  At a low workload she appears to be asymptomatic.  She has some occasional dizziness but no problems with her day-to-day activities.  We discussed the natural history of aortic stenosis today.  I reviewed her serial echo studies.  She has developed significant LVH with restrictive diastolic filling on her most recent echo in September.  An echocardiogram this morning is reviewed as above with a calculated aortic valve area less than 0.4 cm.  Formal interpretation is pending, but it appears she has had progressive changes of aortic stenosis.  We discussed the pros and cons of continued surveillance versus intervention with TAVR.  After shared decision making conversation, we decided to proceed with further evaluation to update her CTA studies and perform cardiac catheterization as part of her preoperative assessment.  I think in the setting of LVH and diastolic dysfunction with severe aortic stenosis, it is appropriate to intervene on her aortic valve rather than allow her heart to have further hypertrophy placing her at risk for heart failure.  Will order a CTA of the heart as well as a CTA of the chest, abdomen, and pelvis.  Will order a cardiac catheterization to evaluate her coronary anatomy.  Once her studies are completed, her case will be reviewed with her multidisciplinary heart valve team and she will be sent for formal cardiac surgical consultation as part of a multidisciplinary approach to her care.

## 2024-02-11 NOTE — Progress Notes (Addendum)
 Pre Surgical Assessment: 5 M Walk Test  74M=16.84ft  5 Meter Walk Test- trial 1: 3.46 seconds 5 Meter Walk Test- trial 2: 3.84 seconds 5 Meter Walk Test- trial 3: 3.31 seconds 5 Meter Walk Test Average: 3.53 seconds  _________________________  Procedure Type: Isolated AVR Perioperative Outcome Estimate % Operative Mortality 1.73% Morbidity & Mortality 5.86% Stroke 0.893% Renal Failure 1.08% Reoperation 2.39% Prolonged Ventilation 3.24% Deep Sternal Wound Infection 0.058% Long Hospital Stay (>14 days) 2.82% Short Hospital Stay (<6 days)* 53.5%

## 2024-02-12 DIAGNOSIS — I350 Nonrheumatic aortic (valve) stenosis: Secondary | ICD-10-CM | POA: Diagnosis not present

## 2024-02-12 DIAGNOSIS — Z01810 Encounter for preprocedural cardiovascular examination: Secondary | ICD-10-CM | POA: Diagnosis not present

## 2024-02-12 LAB — CBC

## 2024-02-13 LAB — BASIC METABOLIC PANEL
BUN/Creatinine Ratio: 19 (ref 12–28)
BUN: 26 mg/dL (ref 8–27)
CO2: 25 mmol/L (ref 20–29)
Calcium: 9.8 mg/dL (ref 8.7–10.3)
Chloride: 101 mmol/L (ref 96–106)
Creatinine, Ser: 1.38 mg/dL — ABNORMAL HIGH (ref 0.57–1.00)
Glucose: 95 mg/dL (ref 70–99)
Potassium: 4.1 mmol/L (ref 3.5–5.2)
Sodium: 140 mmol/L (ref 134–144)
eGFR: 41 mL/min/{1.73_m2} — ABNORMAL LOW (ref >59)

## 2024-02-13 LAB — CBC
Hematocrit: 39.6 % (ref 34.0–46.6)
Hemoglobin: 13 g/dL (ref 11.1–15.9)
MCH: 29 pg (ref 26.6–33.0)
MCHC: 32.8 g/dL (ref 31.5–35.7)
MCV: 88 fL (ref 79–97)
Platelets: 314 10*3/uL (ref 150–450)
RBC: 4.48 x10E6/uL (ref 3.77–5.28)
RDW: 13.1 % (ref 11.7–15.4)
WBC: 8.3 10*3/uL (ref 3.4–10.8)

## 2024-02-14 ENCOUNTER — Encounter: Payer: Medicare PPO | Admitting: Dietician

## 2024-02-14 ENCOUNTER — Ambulatory Visit (HOSPITAL_COMMUNITY): Attending: Internal Medicine

## 2024-02-14 ENCOUNTER — Encounter: Payer: Self-pay | Admitting: Dietician

## 2024-02-14 DIAGNOSIS — E119 Type 2 diabetes mellitus without complications: Secondary | ICD-10-CM

## 2024-02-14 DIAGNOSIS — Z713 Dietary counseling and surveillance: Secondary | ICD-10-CM | POA: Diagnosis not present

## 2024-02-14 NOTE — Progress Notes (Signed)
 Patient was seen on 02/14/2024 for the second of a series of three diabetes self-management courses at the [facility]. The following learning objectives were met by the patient during this class:  Describe the role of different macronutrients on glucose Explain how carbohydrates affect blood glucose State what foods contain the most carbohydrates Demonstrate carbohydrate counting Demonstrate how to read Nutrition Facts food label Describe effects of various fats on heart health Describe the importance of good nutrition for health and healthy eating strategies Describe techniques for managing your shopping, cooking and meal planning List strategies to follow meal plan when dining out Describe the effects of alcohol on glucose and how to use it safely  Goals:  Follow Diabetes Meal Plan as instructed  Aim to spread carbs evenly throughout the day  Aim for 3 meals per day and snacks as needed Include lean protein foods to meals/snacks  Monitor glucose levels as instructed by your doctor   Follow-Up Plan: Attend Core 3 Work towards following your personal food plan.

## 2024-02-15 LAB — ECHOCARDIOGRAM COMPLETE
AR max vel: 0.48 cm2
AV Area VTI: 0.36 cm2
AV Area mean vel: 0.42 cm2
AV Mean grad: 32.8 mmHg
AV Peak grad: 50.3 mmHg
Ao pk vel: 3.55 m/s
Area-P 1/2: 3.72 cm2
P 1/2 time: 478 ms
S' Lateral: 2.3 cm

## 2024-02-19 ENCOUNTER — Ambulatory Visit
Admission: RE | Admit: 2024-02-19 | Discharge: 2024-02-19 | Disposition: A | Discharge status: Home / Self Care | Payer: Medicare PPO | Source: Ambulatory Visit | Attending: Nurse Practitioner | Admitting: Nurse Practitioner

## 2024-02-19 DIAGNOSIS — Z13820 Encounter for screening for osteoporosis: Secondary | ICD-10-CM | POA: Diagnosis not present

## 2024-02-19 DIAGNOSIS — Z780 Asymptomatic menopausal state: Secondary | ICD-10-CM | POA: Insufficient documentation

## 2024-02-19 DIAGNOSIS — Z1231 Encounter for screening mammogram for malignant neoplasm of breast: Secondary | ICD-10-CM

## 2024-02-21 ENCOUNTER — Encounter: Payer: Medicare PPO | Admitting: Dietician

## 2024-02-21 ENCOUNTER — Encounter: Payer: Self-pay | Admitting: Dietician

## 2024-02-21 DIAGNOSIS — E119 Type 2 diabetes mellitus without complications: Secondary | ICD-10-CM | POA: Diagnosis not present

## 2024-02-21 DIAGNOSIS — Z713 Dietary counseling and surveillance: Secondary | ICD-10-CM | POA: Diagnosis not present

## 2024-02-21 NOTE — Progress Notes (Signed)
 Patient was seen on 02/21/2024 for the third of a series of three diabetes self-management courses at the [facility].   State the amount of activity recommended for healthy living Describe activities suitable for individual needs Identify ways to regularly incorporate activity into daily life Identify barriers to activity and ways to over come these barriers Identify diabetes medications being personally used and their primary action for lowering glucose and possible side effects Describe role of stress on blood glucose and develop strategies to address psychosocial issues Identify diabetes complications and ways to prevent them Explain how to manage diabetes during illness Evaluate success in meeting personal goal Establish 2-3 goals that they will plan to diligently work on  Goals:  I will work on counting my carb choices at most meals and snacks I will be active 45 minutes or more 2 times a week To help manage stress I will  meditate and pray at least 4 times a week  Your patient has identified these potential barriers to change:  N/A  Your patient has identified their diabetes self-care support plan as  Family Support On-line Resources  Plan:  Attend Support Group as desired

## 2024-02-25 ENCOUNTER — Telehealth: Payer: Self-pay | Admitting: Cardiovascular Disease

## 2024-02-25 NOTE — Telephone Encounter (Signed)
 Pt called in to go over instructions for cath

## 2024-02-25 NOTE — Telephone Encounter (Signed)
 Called patient back about her message. Went over cath instructions. Informed patient that someone will call her towards the end of the week to go over instructions again. Patient thanked me for the call back.

## 2024-02-26 ENCOUNTER — Other Ambulatory Visit: Payer: Self-pay | Admitting: Nurse Practitioner

## 2024-02-26 DIAGNOSIS — I5022 Chronic systolic (congestive) heart failure: Secondary | ICD-10-CM

## 2024-02-26 NOTE — Telephone Encounter (Signed)
 Called patient and reviewed procedure instructions/pre-procedure hydration with patient.    Cardiac Catheterization scheduled at [facility] for: Monday March 03, 2024 12 Noon  Patient aware procedure time has been moved to 12 Noon to allow time for hydration. Arrival time [facility] Main Entrance A at: 7 AM-pre-procedure hydration  Nothing to eat after midnight prior to procedure, clear liquids until 5 AM day of procedure.  Medication instructions: -Hold:  Eliquis-none 03/01/24 until post procedure  Lasix-day before and day of procedure-per protocol GFR < 60 (41) -Other usual morning medications can be taken with sips of water including aspirin 81 mg.  Plan to go home the same day, you will only stay overnight if medically necessary.  You must have responsible adult to drive you home.  Someone must be with you the first 24 hours after you arrive home.  Reviewed procedure instructions with patient.

## 2024-03-03 ENCOUNTER — Encounter (HOSPITAL_COMMUNITY)
Admission: RE | Disposition: A | Discharge status: Home / Self Care | Payer: Self-pay | Source: Home / Self Care | Attending: Cardiovascular Disease

## 2024-03-03 ENCOUNTER — Ambulatory Visit (HOSPITAL_COMMUNITY)
Admission: RE | Admit: 2024-03-03 | Discharge: 2024-03-03 | Disposition: A | Discharge status: Home / Self Care | Attending: Cardiovascular Disease | Admitting: Cardiovascular Disease

## 2024-03-03 ENCOUNTER — Other Ambulatory Visit: Payer: Self-pay

## 2024-03-03 DIAGNOSIS — I4819 Other persistent atrial fibrillation: Secondary | ICD-10-CM | POA: Diagnosis not present

## 2024-03-03 DIAGNOSIS — Z7901 Long term (current) use of anticoagulants: Secondary | ICD-10-CM | POA: Diagnosis not present

## 2024-03-03 DIAGNOSIS — I350 Nonrheumatic aortic (valve) stenosis: Secondary | ICD-10-CM | POA: Insufficient documentation

## 2024-03-03 DIAGNOSIS — I119 Hypertensive heart disease without heart failure: Secondary | ICD-10-CM | POA: Insufficient documentation

## 2024-03-03 HISTORY — PX: LEFT HEART CATH AND CORONARY ANGIOGRAPHY: CATH118249

## 2024-03-03 SURGERY — LEFT HEART CATH AND CORONARY ANGIOGRAPHY
Anesthesia: LOCAL

## 2024-03-03 MED ORDER — MIDAZOLAM HCL 2 MG/2ML IJ SOLN
INTRAMUSCULAR | Status: DC | PRN
  Administered 2024-03-03: 2 mg via INTRAVENOUS

## 2024-03-03 MED ORDER — FENTANYL CITRATE (PF) 100 MCG/2ML IJ SOLN
INTRAMUSCULAR | Status: DC | PRN
  Administered 2024-03-03: 25 ug via INTRAVENOUS

## 2024-03-03 MED ORDER — IOHEXOL 350 MG/ML SOLN
INTRAVENOUS | Status: DC | PRN
  Administered 2024-03-03: 50 mL

## 2024-03-03 MED ORDER — ACETAMINOPHEN 325 MG PO TABS: 650.0000 mg | ORAL_TABLET | ORAL | Status: DC | PRN

## 2024-03-03 MED ORDER — FENTANYL CITRATE (PF) 100 MCG/2ML IJ SOLN
INTRAMUSCULAR | Status: AC
  Filled 2024-03-03: qty 2

## 2024-03-03 MED ORDER — LIDOCAINE HCL (PF) 1 % IJ SOLN
INTRAMUSCULAR | Status: DC | PRN
  Administered 2024-03-03: 5 mL

## 2024-03-03 MED ORDER — VERAPAMIL HCL 2.5 MG/ML IV SOLN
INTRAVENOUS | Status: DC | PRN
  Administered 2024-03-03: 10 mL via INTRA_ARTERIAL

## 2024-03-03 MED ORDER — HEPARIN SODIUM (PORCINE) 1000 UNIT/ML IJ SOLN
INTRAMUSCULAR | Status: DC | PRN
  Administered 2024-03-03: 4000 [IU] via INTRAVENOUS

## 2024-03-03 MED ORDER — VERAPAMIL HCL 2.5 MG/ML IV SOLN
INTRAVENOUS | Status: AC
  Filled 2024-03-03: qty 2

## 2024-03-03 MED ORDER — HEPARIN (PORCINE) IN NACL 1000-0.9 UT/500ML-% IV SOLN
INTRAVENOUS | Status: DC | PRN
  Administered 2024-03-03 (×2): 500 mL via INTRA_ARTERIAL

## 2024-03-03 MED ORDER — ASPIRIN 81 MG PO CHEW: 81.0000 mg | CHEWABLE_TABLET | ORAL | Status: DC

## 2024-03-03 MED ORDER — SODIUM CHLORIDE 0.9 % WEIGHT BASED INFUSION: 1.0000 mL/kg/h | INTRAVENOUS | Status: DC

## 2024-03-03 MED ORDER — HEPARIN SODIUM (PORCINE) 1000 UNIT/ML IJ SOLN
INTRAMUSCULAR | Status: AC
  Filled 2024-03-03: qty 10

## 2024-03-03 MED ORDER — MIDAZOLAM HCL 2 MG/2ML IJ SOLN
INTRAMUSCULAR | Status: AC
  Filled 2024-03-03: qty 2

## 2024-03-03 MED ORDER — HYDRALAZINE HCL 20 MG/ML IJ SOLN: 10.0000 mg | INTRAMUSCULAR | Status: DC | PRN

## 2024-03-03 MED ORDER — LIDOCAINE HCL (PF) 1 % IJ SOLN
INTRAMUSCULAR | Status: AC
  Filled 2024-03-03: qty 30

## 2024-03-03 MED ORDER — ONDANSETRON HCL 4 MG/2ML IJ SOLN: 4.0000 mg | Freq: Four times a day (QID) | INTRAMUSCULAR | Status: DC | PRN

## 2024-03-03 MED ORDER — LABETALOL HCL 5 MG/ML IV SOLN: 10.0000 mg | INTRAVENOUS | Status: DC | PRN

## 2024-03-03 MED ORDER — SODIUM CHLORIDE 0.9 % WEIGHT BASED INFUSION
3.0000 mL/kg/h | INTRAVENOUS | Status: AC
  Administered 2024-03-03: 3 mL/kg/h via INTRAVENOUS

## 2024-03-03 SURGICAL SUPPLY — 9 items
CATH 5FR JL3.5 JR4 ANG PIG MP (CATHETERS) IMPLANT
CATH INFINITI 5 FR 3DRC (CATHETERS) IMPLANT
DEVICE RAD COMP TR BAND LRG (VASCULAR PRODUCTS) IMPLANT
GLIDESHEATH SLEND SS 6F .021 (SHEATH) IMPLANT
GUIDEWIRE INQWIRE 1.5J.035X260 (WIRE) IMPLANT
INQWIRE 1.5J .035X260CM (WIRE) ×1 IMPLANT
PACK CARDIAC CATHETERIZATION (CUSTOM PROCEDURE TRAY) ×1 IMPLANT
SET ATX-X65L (MISCELLANEOUS) IMPLANT
WIRE HI TORQ VERSACORE-J 145CM (WIRE) IMPLANT

## 2024-03-03 NOTE — Interval H&P Note (Signed)
 History and Physical Interval Note:  03/03/2024 11:09 AM  Tracy James  has presented today for surgery, with the diagnosis of aortic stenosis.  The various methods of treatment have been discussed with the patient and family. After consideration of risks, benefits and other options for treatment, the patient has consented to  Procedure(s): LEFT HEART CATH AND CORONARY ANGIOGRAPHY (N/A) as a surgical intervention.  The patient's history has been reviewed, patient examined, no change in status, stable for surgery.  I have reviewed the patient's chart and labs.  Questions were answered to the patient's satisfaction.     Tonny Bollman

## 2024-03-03 NOTE — Progress Notes (Signed)
 TR BAND REMOVAL  LOCATION:    right radial  DEFLATED PER PROTOCOL:    Yes.    TIME BAND OFF / DRESSING APPLIED:    1525 gauze dressing applied   SITE UPON ARRIVAL:    Level 0  SITE AFTER BAND REMOVAL:    Level 0  CIRCULATION SENSATION AND MOVEMENT:    Within Normal Limits   Yes.    COMMENTS:   no issues noted

## 2024-03-03 NOTE — Discharge Instructions (Signed)

## 2024-03-04 ENCOUNTER — Encounter (HOSPITAL_COMMUNITY): Payer: Self-pay | Admitting: Cardiovascular Disease

## 2024-03-04 ENCOUNTER — Other Ambulatory Visit: Payer: Self-pay | Admitting: Cardiovascular Disease

## 2024-03-10 ENCOUNTER — Telehealth: Payer: Self-pay | Admitting: Cardiovascular Disease

## 2024-03-10 NOTE — Telephone Encounter (Signed)
 Pt called in stating she was told to f/u on when she will have a valve replacement. Please advise.

## 2024-03-10 NOTE — Telephone Encounter (Signed)
 I spoke with the patient and made her aware that we are currently waiting for the CT schedule to be released so that TAVR CT scans can be booked.  I made the patient aware that I will contact her as soon as the schedule becomes available.

## 2024-03-18 ENCOUNTER — Other Ambulatory Visit: Payer: Self-pay

## 2024-03-18 DIAGNOSIS — I350 Nonrheumatic aortic (valve) stenosis: Secondary | ICD-10-CM

## 2024-03-19 ENCOUNTER — Ambulatory Visit: Payer: Medicare PPO | Admitting: Nurse Practitioner

## 2024-03-19 VITALS — BP 122/60 | HR 57 | Temp 97.6°F | Ht 66.5 in | Wt 179.6 lb

## 2024-03-19 DIAGNOSIS — E119 Type 2 diabetes mellitus without complications: Secondary | ICD-10-CM | POA: Insufficient documentation

## 2024-03-19 DIAGNOSIS — I10 Essential (primary) hypertension: Secondary | ICD-10-CM

## 2024-03-19 LAB — POCT GLYCOSYLATED HEMOGLOBIN (HGB A1C): Hemoglobin A1C: 6 % — AB (ref 4.0–5.6)

## 2024-03-19 LAB — BASIC METABOLIC PANEL WITH GFR
BUN: 23 mg/dL (ref 6–23)
CO2: 30 meq/L (ref 19–32)
Calcium: 9.4 mg/dL (ref 8.4–10.5)
Chloride: 102 meq/L (ref 96–112)
Creatinine, Ser: 1.3 mg/dL — ABNORMAL HIGH (ref 0.40–1.20)
GFR: 41.61 mL/min — ABNORMAL LOW (ref >60.00)
Glucose, Bld: 93 mg/dL (ref 70–99)
Potassium: 3.7 meq/L (ref 3.5–5.1)
Sodium: 139 meq/L (ref 135–145)

## 2024-03-19 LAB — MICROALBUMIN / CREATININE URINE RATIO
Creatinine,U: 23.3 mg/dL
Microalb Creat Ratio: UNDETERMINED mg/g (ref 0.0–30.0)
Microalb, Ur: <0.7 mg/dL

## 2024-03-19 NOTE — Patient Instructions (Signed)
 It was a pleasure seeing you today.  We will follow up with you after your labs result.  We will see you again in 6 months for a diabetes recheck.

## 2024-03-19 NOTE — Progress Notes (Signed)
 Established Patient Office Visit  Subjective   Patient ID: Tracy James, female    DOB: 07-Oct-1951  Age: 73 y.o. MRN: [id number]  Chief Complaint  Patient presents with   Diabetes    DM2: During annual physical pt was found to have in increase in A1C to 6.5 at the DM2 level. She was referred to the diabetic educator and encouraged to work on lifestyle modifications. She did follow up with the educator and has made some dietary changes. She has cut back on sweet foods and increased more whole unprocessed food in her diet.      Review of Systems  Constitutional:  Negative for chills, fever and weight loss.  Respiratory:  Negative for shortness of breath.   Cardiovascular:  Negative for chest pain and palpitations.  Gastrointestinal:  Negative for abdominal pain, nausea and vomiting.  Musculoskeletal:  Negative for myalgias.  Neurological:  Positive for headaches. Negative for dizziness.  Psychiatric/Behavioral:  Negative for depression and suicidal ideas.       Objective:     BP 122/60   Pulse (!) 57   Temp 97.6 F (36.4 C) (Oral)   Ht 5' 6.5" (1.689 m)   Wt 81.5 kg   SpO2 98%   BMI 28.55 kg/m  BP Readings from Last 3 Encounters:  03/19/24 122/60  03/03/24 115/79  02/11/24 128/80   Wt Readings from Last 3 Encounters:  03/19/24 81.5 kg  03/03/24 81.6 kg  02/11/24 82 kg   SpO2 Readings from Last 3 Encounters:  03/19/24 98%  03/03/24 98%  02/11/24 96%      Physical Exam Constitutional:      Appearance: Normal appearance.  Cardiovascular:     Rate and Rhythm: Normal rate and regular rhythm.     Heart sounds: Murmur heard.     No friction rub. No gallop.  Pulmonary:     Effort: Pulmonary effort is normal.     Breath sounds: Normal breath sounds.  Neurological:     Mental Status: She is alert and oriented to person, place, and time.  Psychiatric:        Mood and Affect: Mood normal.        Behavior: Behavior normal.    Title   Diabetic Foot Exam  - detailed Date & Time: 03/19/2024  9:27 AM Diabetic Foot exam was performed with the following findings: Yes  Is there a history of foot ulcer?: No Is there a foot ulcer now?: No Is there swelling?: No Is there elevated skin temperature?: No Is there abnormal foot shape?: No Is there a claw toe deformity?: No Are the toenails long?: No Are the toenails thick?: No Are the toenails ingrown?: No Is the skin thin, fragile, shiny and hairless?": No Pulse Foot Exam completed.: Yes   Right Posterior Tibialis: Present Left posterior Tibialis: Present   Right Dorsalis Pedis: Present Left Dorsalis Pedis: Present     Semmes-Weinstein Monofilament Test "+" means "has sensation" and "-" means "no sensation"      Image components are not supported.   Image components are not supported. Image components are not supported.  Tuning Fork Comments Sensation intact to all 10 point monofilament exam locations.  Callous to the L plantar surface.        Results for orders placed or performed in visit on 03/19/24  POCT glycosylated hemoglobin (Hb A1C)  Result Value Ref Range   Hemoglobin A1C 6.0 (A) 4.0 - 5.6 %   HbA1c POC (<> result, manual  entry)     HbA1c, POC (prediabetic range)     HbA1c, POC (controlled diabetic range)        The 10-year ASCVD risk score (Arnett DK, et al., 2019) is: 20.5%    Assessment & Plan:   Problem List Items Addressed This Visit     Essential hypertension - Primary   Pt's anticipating having a TAVR procedure and needs updated renal function. Labs pending.       Relevant Orders   Basic metabolic panel with GFR   Well controlled type 2 diabetes mellitus (HCC)   A1C down today to 6.0 from previous 6.5. Pt is working on lifestyle modifications and will continue to do so. UACR pending.       Relevant Orders   POCT glycosylated hemoglobin (Hb A1C) (Completed)   Microalbumin / creatinine urine ratio    Return in about 6 months (around 09/18/2024) for  DM.    Armond Lands, RN

## 2024-03-19 NOTE — Assessment & Plan Note (Addendum)
 A1C down today to 6.0 from previous 6.5. Pt is working on lifestyle modifications and will continue to do so. UACR pending.   I evaluated patient, was consulted regarding treatment, and agree with assessment and plan per Milinda Allen RN, FNP Student   Margarie Shay, DNP, AGNP-C

## 2024-03-19 NOTE — Assessment & Plan Note (Addendum)
 Pt's anticipating having a TAVR procedure and needs updated renal function. Labs pending.   I evaluated patient, was consulted regarding treatment, and agree with assessment and plan per Milinda Allen RN, FNP Student   Margarie Shay, DNP, AGNP-C

## 2024-03-19 NOTE — Progress Notes (Signed)
 Established Patient Office Visit  Subjective   Patient ID: Tracy James, female    DOB: [birth year]  Age: 73 y.o. MRN: [id number]  Chief Complaint  Patient presents with   Diabetes      DM2: patient is currently maintained on lifestyle modifications only. She was dx at her last office visit when we did preventative blood work. States that she did follow up with the educator. She has cut back on sweets and eating more whole foods.     Review of Systems  Constitutional:  Negative for chills and fever.  Respiratory:  Negative for shortness of breath.   Cardiovascular:  Negative for chest pain.  Neurological:  Positive for headaches.      Objective:     BP 122/60   Pulse (!) 57   Temp 97.6 F (36.4 C) (Oral)   Ht 5' 6.5" (1.689 m)   Wt 179 lb 9.6 oz (81.5 kg)   SpO2 98%   BMI 28.55 kg/m  BP Readings from Last 3 Encounters:  03/19/24 122/60  03/03/24 115/79  02/11/24 128/80   Wt Readings from Last 3 Encounters:  03/19/24 179 lb 9.6 oz (81.5 kg)  03/03/24 180 lb (81.6 kg)  02/11/24 180 lb 12.8 oz (82 kg)   SpO2 Readings from Last 3 Encounters:  03/19/24 98%  03/03/24 98%  02/11/24 96%      Physical Exam Vitals and nursing note reviewed.  Constitutional:      Appearance: Normal appearance.  Cardiovascular:     Rate and Rhythm: Normal rate and regular rhythm.     Heart sounds: Murmur heard.  Pulmonary:     Effort: Pulmonary effort is normal.     Breath sounds: Normal breath sounds.  Neurological:     Mental Status: She is alert.    Title   Diabetic Foot Exam - detailed Date & Time: 03/19/2024  9:27 AM Diabetic Foot exam was performed with the following findings: Yes  Is there a history of foot ulcer?: No Is there a foot ulcer now?: No Is there swelling?: No Is there elevated skin temperature?: No Is there abnormal foot shape?: No Is there a claw toe deformity?: No Are the toenails long?: No Are the toenails thick?: No Are the toenails  ingrown?: No Is the skin thin, fragile, shiny and hairless?": No Pulse Foot Exam completed.: Yes   Right Posterior Tibialis: Present Left posterior Tibialis: Present   Right Dorsalis Pedis: Present Left Dorsalis Pedis: Present     Semmes-Weinstein Monofilament Test "+" means "has sensation" and "-" means "no sensation"      Image components are not supported.   Image components are not supported. Image components are not supported.  Tuning Fork Comments Sensation intact to all 10 point monofilament exam locations.  Callous to the L plantar surface.      Results for orders placed or performed in visit on 03/19/24  Basic metabolic panel with GFR  Result Value Ref Range   Sodium 139 135 - 145 mEq/L   Potassium 3.7 3.5 - 5.1 mEq/L   Chloride 102 96 - 112 mEq/L   CO2 30 19 - 32 mEq/L   Glucose, Bld 93 70 - 99 mg/dL   BUN 23 6 - 23 mg/dL   Creatinine, Ser 4.69 (H) 0.40 - 1.20 mg/dL   GFR 62.95 (L) >28.41 mL/min   Calcium 9.4 8.4 - 10.5 mg/dL  Microalbumin / creatinine urine ratio  Result Value Ref Range   Microalb, Ur <0.7  mg/dL   Creatinine,U 40.9 mg/dL   Microalb Creat Ratio Unable to calculate 0.0 - 30.0 mg/g  POCT glycosylated hemoglobin (Hb A1C)  Result Value Ref Range   Hemoglobin A1C 6.0 (A) 4.0 - 5.6 %   HbA1c POC (<> result, manual entry)     HbA1c, POC (prediabetic range)     HbA1c, POC (controlled diabetic range)        The 10-year ASCVD risk score (Arnett DK, et al., 2019) is: 20.5%    Assessment & Plan:   Problem List Items Addressed This Visit       Cardiovascular and Mediastinum   Essential hypertension - Primary   Pt's anticipating having a TAVR procedure and needs updated renal function. Labs pending.   I evaluated patient, was consulted regarding treatment, and agree with assessment and plan per Tracy Allen RN, FNP Student   Tracy Shay, DNP, AGNP-C       Relevant Orders   Basic metabolic panel with GFR (Completed)     Endocrine   Well  controlled type 2 diabetes mellitus (HCC)   A1C down today to 6.0 from previous 6.5. Pt is working on lifestyle modifications and will continue to do so. UACR pending.   I evaluated patient, was consulted regarding treatment, and agree with assessment and plan per Tracy Allen RN, FNP Student   Tracy Shay, DNP, AGNP-C       Relevant Orders   POCT glycosylated hemoglobin (Hb A1C) (Completed)   Microalbumin / creatinine urine ratio (Completed)    Return in about 6 months (around 09/18/2024) for DM.    Tracy Shay, NP

## 2024-04-03 ENCOUNTER — Ambulatory Visit (HOSPITAL_COMMUNITY)
Admission: RE | Admit: 2024-04-03 | Discharge: 2024-04-03 | Disposition: A | Discharge status: Home / Self Care | Source: Ambulatory Visit | Attending: Cardiology | Admitting: Cardiology

## 2024-04-03 DIAGNOSIS — K5730 Diverticulosis of large intestine without perforation or abscess without bleeding: Secondary | ICD-10-CM | POA: Diagnosis not present

## 2024-04-03 DIAGNOSIS — I517 Cardiomegaly: Secondary | ICD-10-CM | POA: Diagnosis not present

## 2024-04-03 DIAGNOSIS — Z01818 Encounter for other preprocedural examination: Secondary | ICD-10-CM | POA: Diagnosis not present

## 2024-04-03 DIAGNOSIS — I70201 Unspecified atherosclerosis of native arteries of extremities, right leg: Secondary | ICD-10-CM | POA: Diagnosis not present

## 2024-04-03 DIAGNOSIS — Z48812 Encounter for surgical aftercare following surgery on the circulatory system: Secondary | ICD-10-CM | POA: Diagnosis not present

## 2024-04-03 DIAGNOSIS — I350 Nonrheumatic aortic (valve) stenosis: Secondary | ICD-10-CM | POA: Insufficient documentation

## 2024-04-03 DIAGNOSIS — I700 Atherosclerosis of aorta: Secondary | ICD-10-CM | POA: Diagnosis not present

## 2024-04-03 DIAGNOSIS — Z01810 Encounter for preprocedural cardiovascular examination: Secondary | ICD-10-CM | POA: Diagnosis not present

## 2024-04-03 MED ORDER — IOHEXOL 350 MG/ML SOLN
100.0000 mL | Freq: Once | INTRAVENOUS | Status: AC | PRN
  Administered 2024-04-03: 100 mL via INTRAVENOUS

## 2024-04-04 ENCOUNTER — Encounter: Payer: Self-pay | Admitting: Physician Assistant

## 2024-04-04 DIAGNOSIS — I480 Paroxysmal atrial fibrillation: Secondary | ICD-10-CM | POA: Insufficient documentation

## 2024-04-17 NOTE — Progress Notes (Unsigned)
 [address]       [place] [address]             [phone]        Tracy James [facility] Medical Record #[id number] Date of Birth: [birth year]  Referring: Tracy Gaster, NP Primary Care: Tracy Gaster, NP Primary Cardiologist:Michael Arlester Ladd, MD  Chief Complaint:   No chief complaint on file.   History of Present Illness:     KAMEELA DEMERCHANT is a 73 y.o. female presents for surgical evaluation of ***      Past Medical History:  Diagnosis Date   CKD (chronic kidney disease)    CKD IIIb   Hypertension    Nonischemic cardiomyopathy    Tachy induced CM // TEE 12/2018: EF 30-35 // Echo 06/2019: EF 55-60 // Cor CTA 8/18: Ca score 0; no CAD   PAF (paroxysmal atrial fibrillation) (HCC)    Severe aortic stenosis     Past Surgical History:  Procedure Laterality Date   COLONOSCOPY WITH PROPOFOL  N/A 03/30/2022   Procedure: COLONOSCOPY WITH PROPOFOL ;  Surgeon: Asencion Blacksmith, MD;  Location: [facility] ENDOSCOPY;  Service: Gastroenterology;  Laterality: N/A;   LEFT HEART CATH AND CORONARY ANGIOGRAPHY N/A 03/03/2024   Procedure: LEFT HEART CATH AND CORONARY ANGIOGRAPHY;  Surgeon: Arnoldo Lapping, MD;  Location: [facility] INVASIVE CV LAB;  Service: Cardiovascular;  Laterality: N/A;   POLYPECTOMY  03/30/2022   Procedure: POLYPECTOMY;  Surgeon: Asencion Blacksmith, MD;  Location: [facility] ENDOSCOPY;  Service: Gastroenterology;;   TEE WITHOUT CARDIOVERSION N/A 12/06/2018   Procedure: TRANSESOPHAGEAL ECHOCARDIOGRAM (TEE);  Surgeon: Lenise Quince, MD;  Location: [facility] ENDOSCOPY;  Service: Cardiovascular;  Laterality: N/A;   TEE WITHOUT CARDIOVERSION N/A 01/20/2019   Procedure: TRANSESOPHAGEAL ECHOCARDIOGRAM (TEE);  Surgeon: Euell Herrlich, MD;  Location: [facility] ENDOSCOPY;  Service: Cardiovascular;  Laterality: N/A;   TONSILLECTOMY      Social History:  Social History   Tobacco Use  Smoking Status Former   Current packs/day: 0.00   Types: Cigarettes   Quit date: 12/05/2011   Years  since quitting: 12.3  Smokeless Tobacco Never  Tobacco Comments    Former smoker 06/15/22    Social History   Substance and Sexual Activity  Alcohol Use Not Currently   Alcohol/week: 1.0 standard drink of alcohol   Types: 1 Glasses of wine per week   Comment: stop drinking occ 2 months 06/15/22     No Known Allergies    Current Outpatient Medications  Medication Sig Dispense Refill   acetaminophen  (TYLENOL ) 500 MG tablet Take 500 mg by mouth every 8 (eight) hours as needed for moderate pain (pain score 4-6) (for pain.).     apixaban  (ELIQUIS ) 5 MG TABS tablet Take 1 tablet by mouth twice daily 180 tablet 1   atorvastatin  (LIPITOR) 20 MG tablet Take 1 tablet by mouth once daily 90 tablet 1   calcium -vitamin D  (OSCAL WITH D) 500-200 MG-UNIT TABS tablet Take 1 tablet by mouth daily.      dofetilide  (TIKOSYN ) 250 MCG capsule Take 1 capsule by mouth twice daily 180 capsule 3   furosemide  (LASIX ) 40 MG tablet Take 1 tablet by mouth once daily 90 tablet 0   hydroxypropyl methylcellulose / hypromellose (ISOPTO TEARS / GONIOVISC) 2.5 % ophthalmic solution Place 1 drop into both eyes as needed for dry eyes.     loratadine  (CLARITIN ) 10 MG tablet Take 10 mg by mouth daily as needed for allergies.  metoprolol  tartrate (LOPRESSOR ) 25 MG tablet Take 1 tablet (25 mg total) by mouth 2 (two) times daily. 180 tablet 2   mometasone  (ELOCON ) 0.1 % ointment Apply 1 application  topically daily as needed (for skin rash). 45 g 1   nitroGLYCERIN  (NITROSTAT ) 0.4 MG SL tablet Place 1 tablet (0.4 mg total) under the tongue every 5 (five) minutes as needed for chest pain. 20 tablet 0   Olopatadine  HCl 0.2 % SOLN Apply 1 drop to eye daily. One drop to affected eye once daily (Patient taking differently: Apply 1 drop to eye daily as needed (eye allergies). One drop to affected eye once daily) 2.5 mL 2   Omega-3 1000 MG CAPS Take 1,000 mg by mouth daily.      No current facility-administered medications for  this visit.    (Not in a hospital admission)   Family History  Problem Relation Age of Onset   Arthritis Mother    Cancer Mother        colon cancer   Diabetes Mother    Hypertension Mother    Heart murmur Mother    Early death Brother    Heart murmur Brother    Lung cancer Brother    Arthritis Maternal Grandmother    Heart disease Maternal Grandmother        massive heart attack   Heart disease Paternal Grandmother    Asthma Brother    Diabetes Brother    Hypertension Brother    Heart murmur Brother    Heart disease Father        anuersym- ruptered     Review of Systems:   ROS    Physical Exam: There were no vitals taken for this visit. Physical Exam    Diagnostic Studies & Laboratory data:    Left Heart Catherization: *** Echo: ***   EKG: *** CT Scan: *** I have independently reviewed the above radiologic studies and discussed with the patient   Recent Lab Findings: Lab Results  Component Value Date   WBC 8.3 02/12/2024   HGB 13.0 02/12/2024   HCT 39.6 02/12/2024   PLT 314 02/12/2024   GLUCOSE 93 03/19/2024   CHOL 164 12/13/2023   TRIG 152.0 (H) 12/13/2023   HDL 50.30 12/13/2023   LDLCALC 83 12/13/2023   ALT 19 12/13/2023   AST 24 12/13/2023   NA 139 03/19/2024   K 3.7 03/19/2024   CL 102 03/19/2024   CREATININE 1.30 (H) 03/19/2024   BUN 23 03/19/2024   CO2 30 03/19/2024   TSH 1.51 12/13/2023   INR 1.2 03/27/2022   HGBA1C 6.0 (A) 03/19/2024      Assessment / Plan:   73 y.o. female with severe aortic stenosis.  STS score: ***.  NYHA Class ***.  The risks and benefits of *** TAVR were discussed in detail.  We also discussed possibility of an emergent sternotomy to address any procedural complications.  Based on our discussion, we collectively decided that an emergent sternotomy would *** be indicated.  The patient is *** agreeable to proceed.  Based on my review of her LHC, echo, and CTA, I agree with the multidisciplinary plan to  proceed with a *** TAVR.      I  spent {CHL ONC TIME VISIT - JYNWG:[id number]} counseling the patient face to face.   Hilarie Lovely 04/17/2024 3:04 PM

## 2024-04-18 ENCOUNTER — Ambulatory Visit
Attending: Thoracic Surgery (Cardiothoracic Vascular Surgery) | Admitting: Thoracic Surgery (Cardiothoracic Vascular Surgery)

## 2024-04-18 ENCOUNTER — Encounter: Payer: Self-pay | Admitting: Thoracic Surgery (Cardiothoracic Vascular Surgery)

## 2024-04-18 VITALS — BP 108/65 | HR 86 | Resp 20 | Ht 66.5 in | Wt 179.0 lb

## 2024-04-18 DIAGNOSIS — I350 Nonrheumatic aortic (valve) stenosis: Secondary | ICD-10-CM

## 2024-04-18 NOTE — Progress Notes (Signed)
 Pre Surgical Assessment: 5 M Walk Test  1M=16.78ft  5 Meter Walk Test- trial 1: 5.85 seconds 5 Meter Walk Test- trial 2: 6.51 seconds 5 Meter Walk Test- trial 3: 5.70 seconds 5 Meter Walk Test Average: 6.02 seconds

## 2024-04-25 ENCOUNTER — Telehealth: Payer: Self-pay | Admitting: Cardiovascular Disease

## 2024-04-25 ENCOUNTER — Ambulatory Visit
Attending: Thoracic Surgery (Cardiothoracic Vascular Surgery) | Admitting: Thoracic Surgery (Cardiothoracic Vascular Surgery)

## 2024-04-25 DIAGNOSIS — I350 Nonrheumatic aortic (valve) stenosis: Secondary | ICD-10-CM | POA: Diagnosis not present

## 2024-04-25 NOTE — Telephone Encounter (Signed)
 I spoke with the patient and explained our plan. She understands and agrees. I'd like to see her in follow-up in 6 months. thx

## 2024-04-25 NOTE — Telephone Encounter (Signed)
 Pt called in asking to speak with Dr. Arlester Ladd about her recent visit today with [facility]. Did not wish to go into detail. Please advise.

## 2024-04-25 NOTE — Telephone Encounter (Signed)
 Spoke with patient and she would like to speak to Dr. Arlester Ladd about a call she received from Dr. Dema Filler. She did not want to discuss with me. She states can I just ask Dr. Arlester Ladd to call her.  She is aware he is out of office

## 2024-04-25 NOTE — Progress Notes (Signed)
     [address]       [name] [address]             [phone]       Patient: Home Provider: Office Consent for Telemedicine visit obtained.  Today's visit was completed via a real-time telehealth (see specific modality noted below). The patient/authorized person provided oral consent at the time of the visit to engage in a telemedicine encounter with the present provider at [facility]. The patient/authorized person was informed of the potential benefits, limitations, and risks of telemedicine. The patient/authorized person expressed understanding that the laws that protect confidentiality also apply to telemedicine. The patient/authorized person acknowledged understanding that telemedicine does not provide emergency services and that he or she would need to call 911 or proceed to the nearest hospital for help if such a need arose.   Total time spent in the clinical discussion 10 minutes.  Telehealth Modality: Phone visit (audio only)  I had a telephone visit with Tracy James We discussed the plan from a multidisciplinary meeting after reviewing her images.  She does have a bicuspid valve with minimal calcification, and sinus asymmetry.  Based off of her anatomy as it relates to her left coronary height and sinus length, she would not be a good candidate for TAVR.  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  Currently she is asymptomatic, and doing water aerobics on a weekly basis without any complaints.  She will continue to follow-up with cardiology, and when she does become symptomatic she will be treated with a surgical aortic valve.

## 2024-04-29 NOTE — Telephone Encounter (Signed)
 I spoke with the patient and advised her that she will be due to see Dr Arlester Ladd in November and his schedule is currently not available.  I will contact the patient to arrange appointment once his clinic schedule has been released.  I reviewed symptoms that the patient should monitor for due to aortic stenosis and advised that she contact the office if she experiences these symptoms.  Pt agreed with plan.

## 2024-05-06 ENCOUNTER — Other Ambulatory Visit: Payer: Self-pay | Admitting: Physician Assistant

## 2024-05-06 ENCOUNTER — Other Ambulatory Visit: Payer: Self-pay | Admitting: Nurse Practitioner

## 2024-05-06 DIAGNOSIS — N1831 Chronic kidney disease, stage 3a: Secondary | ICD-10-CM

## 2024-05-06 DIAGNOSIS — I10 Essential (primary) hypertension: Secondary | ICD-10-CM

## 2024-05-06 DIAGNOSIS — I5022 Chronic systolic (congestive) heart failure: Secondary | ICD-10-CM

## 2024-05-27 ENCOUNTER — Telehealth: Payer: Self-pay

## 2024-05-27 NOTE — Telephone Encounter (Signed)
   Name: Tracy James  DOB: [birth year]  MRN: [id number]  Primary Cardiologist: Ozell Fell, MD   Preoperative team, please contact this patient and set up a phone call appointment for further preoperative risk assessment. Please obtain consent and complete medication review. Thank you for your help.  I confirm that guidance regarding antiplatelet and oral anticoagulation therapy has been completed and, if necessary, noted below.  Patient does not require pre-op antibiotics for dental procedure.   Per office protocol, patient can hold Eliquis  for 1 days prior to procedure.   Patient will not need bridging with Lovenox (enoxaparin) around procedure.  I also confirmed the patient resides in the state of [place] . As per [place] [employer] telemedicine laws, the patient must reside in the state in which the provider is licensed.   [name], NP 05/27/2024, 4:34 PM [facility]

## 2024-05-27 NOTE — Telephone Encounter (Signed)
   Pre-operative Risk Assessment    Patient Name: Tracy James  DOB: 09/16/1951 MRN: [id number]   Date of last office visit: 02/11/24 OZELL FELL, MD Date of next office visit: NONE  Request for Surgical Clearance    Procedure:  Dental Extraction - Amount of Teeth to be Pulled:  3 TEETH  Date of Surgery:  Clearance TBD                                Surgeon:  NOT INDICATED Surgeon's Group or Practice Name:  [facility] Phone number:  [phone] Fax number:  [phone]   Type of Clearance Requested:   - Medical  - Pharmacy:  Hold Apixaban  (Eliquis ) 1-2 DAYS PRIOR   Type of Anesthesia:  Not Indicated   Additional requests/questions:    Signed, Lucie DELENA Ku   05/27/2024, 1:10 PM

## 2024-05-27 NOTE — Telephone Encounter (Signed)
 Patient with diagnosis of afib on Eliquis  for anticoagulation.    Procedure: Dental Extraction - Amount of Teeth to be Pulled:  3 TEETH  Date of procedure: TBD   CHA2DS2-VASc Score = 5   This indicates a 7.2% annual risk of stroke. The patient's score is based upon: CHF History: 1 HTN History: 1 Diabetes History: 0 Stroke History: 0 Vascular Disease History: 1 (aoritc atherosclerosis) Age Score: 1 Gender Score: 1    CrCl 44 mL/min Platelet count 314 K    Patient does not require pre-op antibiotics for dental procedure.  Per office protocol, patient can hold Eliquis  for 1 days prior to procedure.   Patient will not need bridging with Lovenox (enoxaparin) around procedure.  **This guidance is not considered finalized until pre-operative APP has relayed final recommendations.**

## 2024-05-27 NOTE — Telephone Encounter (Signed)
 S/W pt and scheduled TELE Preop appt 06/10/24. Med Rec and consent done   Pt stated that the dates set for dental procedure is 07/07/24.

## 2024-05-27 NOTE — Telephone Encounter (Signed)
 Med Rec and consent done     Patient Consent for Virtual Visit        Tracy James has provided verbal consent on 05/27/2024 for a virtual visit (video or telephone).   CONSENT FOR VIRTUAL VISIT FOR:  Tracy James  By participating in this virtual visit I agree to the following:  I hereby voluntarily request, consent and authorize [facility] and its employed or contracted physicians, physician assistants, nurse practitioners or other licensed health care professionals (the Practitioner), to provide me with telemedicine health care services (the "Services) as deemed necessary by the treating Practitioner. I acknowledge and consent to receive the Services by the Practitioner via telemedicine. I understand that the telemedicine visit will involve communicating with the Practitioner through live audiovisual communication technology and the disclosure of certain medical information by electronic transmission. I acknowledge that I have been given the opportunity to request an in-person assessment or other available alternative prior to the telemedicine visit and am voluntarily participating in the telemedicine visit.  I understand that I have the right to withhold or withdraw my consent to the use of telemedicine in the course of my care at any time, without affecting my right to future care or treatment, and that the Practitioner or I may terminate the telemedicine visit at any time. I understand that I have the right to inspect all information obtained and/or recorded in the course of the telemedicine visit and may receive copies of available information for a reasonable fee.  I understand that some of the potential risks of receiving the Services via telemedicine include:  Delay or interruption in medical evaluation due to technological equipment failure or disruption; Information transmitted may not be sufficient (e.g. poor resolution of images) to allow for appropriate medical  decision making by the Practitioner; and/or  In rare instances, security protocols could fail, causing a breach of personal health information.  Furthermore, I acknowledge that it is my responsibility to provide information about my medical history, conditions and care that is complete and accurate to the best of my ability. I acknowledge that Practitioner's advice, recommendations, and/or decision may be based on factors not within their control, such as incomplete or inaccurate data provided by me or distortions of diagnostic images or specimens that may result from electronic transmissions. I understand that the practice of medicine is not an exact science and that Practitioner makes no warranties or guarantees regarding treatment outcomes. I acknowledge that a copy of this consent can be made available to me via my patient portal [facility] MyChart), or I can request a printed copy by calling the office of [facility].    I understand that my insurance will be billed for this visit.   I have read or had this consent read to me. I understand the contents of this consent, which adequately explains the benefits and risks of the Services being provided via telemedicine.  I have been provided ample opportunity to ask questions regarding this consent and the Services and have had my questions answered to my satisfaction. I give my informed consent for the services to be provided through the use of telemedicine in my medical care

## 2024-06-03 ENCOUNTER — Telehealth: Payer: Self-pay

## 2024-06-03 NOTE — Telephone Encounter (Signed)
 Received a clearance from this dental office ([facility]) today. Patient already had a Dental clearance in her chart on 06/24 for [facility]. I spoke with the patient and the patient states she will be having her procedure with [place] and not with Dental care.

## 2024-06-04 ENCOUNTER — Other Ambulatory Visit: Payer: Self-pay

## 2024-06-04 DIAGNOSIS — I350 Nonrheumatic aortic (valve) stenosis: Secondary | ICD-10-CM

## 2024-06-10 ENCOUNTER — Ambulatory Visit: Attending: Cardiology | Admitting: Nurse Practitioner

## 2024-06-10 DIAGNOSIS — Z01810 Encounter for preprocedural cardiovascular examination: Secondary | ICD-10-CM | POA: Diagnosis not present

## 2024-06-10 NOTE — Progress Notes (Signed)
 Virtual Visit via Telephone Note   Because of [name] co-morbid illnesses, she is at least at moderate risk for complications without adequate follow up.  This format is felt to be most appropriate for this patient at this time.  Due to technical limitations with video connection (technology), today's appointment will be conducted as an audio only telehealth visit, and [name] verbally agreed to proceed in this manner.   All issues noted in this document were discussed and addressed.  No physical exam could be performed with this format.  Evaluation Performed:  Preoperative cardiovascular risk assessment _____________   Date:  06/10/2024   Patient ID:  Tracy James, Tracy James 05-29-1951, MRN [id number] Patient Location:  Home Provider location:   Office  Primary Care Provider:  Wendee Lynwood HERO, NP Primary Cardiologist:  Ozell Fell, MD  Chief Complaint / Patient Profile   73 y.o. y/o female with a h/o aortic stenosis, paroxysmal atrial fibrillation, NICM, pretension, and CKD stage IIIb who is pending dental extraction (3 teeth) with [place] [facility] and presents today for telephonic preoperative cardiovascular risk assessment.  History of Present Illness    Tracy James is a 73 y.o. female who presents via audio/video conferencing for a telehealth visit today.  Pt was last seen in cardiology clinic on 02/11/2024 by Dr. Fell. At that time Tracy James was doing well.  The patient is now pending procedure as outlined above. Since her last visit, she has done well from a cardiac standpoint.  She participates in water aerobics 2-3 times a week.  She denies chest pain, palpitations, dyspnea, pnd, orthopnea, n, v, dizziness, syncope, edema, weight gain, or early satiety. All other systems reviewed and are otherwise negative except as noted above.   Past Medical History    Past Medical History:  Diagnosis Date   CKD (chronic  kidney disease)    CKD IIIb   Hypertension    Nonischemic cardiomyopathy    Tachy induced CM // TEE 12/2018: EF 30-35 // Echo 06/2019: EF 55-60 // Cor CTA 8/18: Ca score 0; no CAD   PAF (paroxysmal atrial fibrillation) (HCC)    Severe aortic stenosis    Past Surgical History:  Procedure Laterality Date   COLONOSCOPY WITH PROPOFOL  N/A 03/30/2022   Procedure: COLONOSCOPY WITH PROPOFOL ;  Surgeon: Aneita Gwendlyn DASEN, MD;  Location[facility] ENDOSCOPY;  Service: Gastroenterology;  Laterality: N/A;   LEFT HEART CATH AND CORONARY ANGIOGRAPHY N/A 03/03/2024   Procedure: LEFT HEART CATH AND CORONARY ANGIOGRAPHY;  Surgeon: Fell Ozell, MD;  Location: [facility] INVASIVE CV LAB;  Service: Cardiovascular;  Laterality: N/A;   POLYPECTOMY  03/30/2022   Procedure: POLYPECTOMY;  Surgeon: Aneita Gwendlyn DASEN, MD;  Location: [facility] ENDOSCOPY;  Service: Gastroenterology;;   TEE WITHOUT CARDIOVERSION N/A 12/06/2018   Procedure: TRANSESOPHAGEAL ECHOCARDIOGRAM (TEE);  Surgeon: Pietro Redell RAMAN, MD;  Location: [facility] ENDOSCOPY;  Service: Cardiovascular;  Laterality: N/A;   TEE WITHOUT CARDIOVERSION N/A 01/20/2019   Procedure: TRANSESOPHAGEAL ECHOCARDIOGRAM (TEE);  Surgeon: Loni Soyla LABOR, MD;  Location: [facility] ENDOSCOPY;  Service: Cardiovascular;  Laterality: N/A;   TONSILLECTOMY      Allergies  No Known Allergies  Home Medications    Prior to Admission medications   Medication Sig Start Date End Date Taking? Authorizing Provider  acetaminophen  (TYLENOL ) 500 MG tablet Take 500 mg by mouth every 8 (eight) hours as needed for moderate pain (pain score 4-6) (for pain.).    [provider]  apixaban  (ELIQUIS ) 5 MG TABS tablet Take 1 tablet by mouth twice daily 05/07/24   Fenton, Clint R, PA  atorvastatin  (LIPITOR) 20 MG tablet Take 1 tablet by mouth once daily 02/27/24   Wendee Lynwood HERO, NP  calcium -vitamin D  (OSCAL WITH D) 500-200 MG-UNIT TABS tablet Take 1 tablet by mouth daily.     [provider]  dofetilide  (TIKOSYN )  250 MCG capsule Take 1 capsule by mouth twice daily 03/05/24   Cooper, Michael, MD  furosemide  (LASIX ) 40 MG tablet Take 1 tablet by mouth once daily 05/07/24   Wendee Lynwood HERO, NP  hydroxypropyl methylcellulose / hypromellose (ISOPTO TEARS / GONIOVISC) 2.5 % ophthalmic solution Place 1 drop into both eyes as needed for dry eyes.    [provider]  loratadine  (CLARITIN ) 10 MG tablet Take 10 mg by mouth daily as needed for allergies.    [provider]  metoprolol  tartrate (LOPRESSOR ) 25 MG tablet Take 1 tablet (25 mg total) by mouth 2 (two) times daily. 10/22/23   Wonda Sharper, MD  mometasone  (ELOCON ) 0.1 % ointment Apply 1 application  topically daily as needed (for skin rash). 07/31/22   Newlin, Enobong, MD  nitroGLYCERIN  (NITROSTAT ) 0.4 MG SL tablet Place 1 tablet (0.4 mg total) under the tongue every 5 (five) minutes as needed for chest pain. 07/24/17   Bari Theodoro FALCON, MD  Olopatadine  HCl 0.2 % SOLN Apply 1 drop to eye daily. One drop to affected eye once daily Patient taking differently: Apply 1 drop to eye daily as needed (eye allergies). One drop to affected eye once daily 03/24/21   Newlin, Enobong, MD  Omega-3 1000 MG CAPS Take 1,000 mg by mouth daily.     [provider]    Physical Exam    Vital Signs:  DARRELLE BARRELL does not have vital signs available for review today.  Given telephonic nature of communication, physical exam is limited. AAOx3. NAD. Normal affect.  Speech and respirations are unlabored.  Accessory Clinical Findings    None  Assessment & Plan    1.  Preoperative Cardiovascular Risk Assessment:  According to the Revised Cardiac Risk Index (RCRI), her Perioperative Risk of Major Cardiac Event is (%): 0.4. Her Functional Capacity in METs is: 5.62 according to the [facility] Activity Status Index (DASI).Therefore, based on ACC/AHA guidelines, patient would be at acceptable risk for the planned procedure without further cardiovascular  testing.  The patient was advised that if she develops new symptoms prior to surgery to contact our office to arrange for a follow-up visit, and she verbalized understanding.  Patient does not require pre-op antibiotics for dental procedure.   Per office protocol, patient can hold Eliquis  for 1 days prior to procedure.  Patient will not need bridging with Lovenox (enoxaparin) around procedure.  A copy of this note will be routed to requesting surgeon.  Time:   Today, I have spent 6 minutes with the patient with telehealth technology discussing medical history, symptoms, and management plan.     Damien JAYSON Braver, NP  06/10/2024, 9:09 AM;e

## 2024-06-19 ENCOUNTER — Ambulatory Visit (HOSPITAL_COMMUNITY)
Admission: RE | Admit: 2024-06-19 | Discharge: 2024-06-19 | Disposition: A | Discharge status: Home / Self Care | Payer: Medicare PPO | Source: Ambulatory Visit | Attending: Physician Assistant | Admitting: Physician Assistant

## 2024-06-19 ENCOUNTER — Encounter (HOSPITAL_COMMUNITY): Payer: Self-pay | Admitting: Physician Assistant

## 2024-06-19 VITALS — BP 122/80 | HR 64 | Ht 66.5 in | Wt 180.6 lb

## 2024-06-19 DIAGNOSIS — I4819 Other persistent atrial fibrillation: Secondary | ICD-10-CM | POA: Diagnosis not present

## 2024-06-19 DIAGNOSIS — Z79899 Other long term (current) drug therapy: Secondary | ICD-10-CM

## 2024-06-19 DIAGNOSIS — D6869 Other thrombophilia: Secondary | ICD-10-CM | POA: Diagnosis not present

## 2024-06-19 DIAGNOSIS — Z5181 Encounter for therapeutic drug level monitoring: Secondary | ICD-10-CM | POA: Diagnosis not present

## 2024-06-19 NOTE — Progress Notes (Signed)
 Primary Care Physician: Wendee Lynwood HERO, NP Primary Cardiologist: Dr Wonda Primary Electrophysiologist: Dr Kelsie (previously) Referring Physician: Glendia Ferrier PA-C   Tracy James is a 73 y.o. female with a history of persistent atrial fibrillation, tachycardia induced cardiomyopathy, AS, HTN, and tobacco abuse who presents for follow up in the [facility]. She was admitted in 11/2018 with AF with RVR.  EF was 40-45 on Echo and TEE demonstrated thrombus in the R and L atria and possible thrombus adjacent to the heart.  After a month of anticoagulation, she underwent repeat TEE-DCCV 01/20/2019.  Unfortunately, the TEE continued to show residual clot in the LA.  Therefore, DCCV was not performed.  She was seen in follow up on 02/2019 and her HR was uncontrolled.  Unfortunately, repeat DCCV was not arranged due to COVID-19 restrictions (elective procedures on hold).  She was placed on Digoxin  for rate control. She was admitted 07/15/19-07/18/19 for dofetilide  loading. She is on Eliquis  for stroke prevention.   Patient was hospitalized 03/2022 with lower GI bleed, was not transfused. GI performed a colonoscopy and bleeding felt to be diverticular.   Patient returns for follow up for atrial fibrillation and dofetilide  monitoring. Patient reports that she has done well since her last visit with no interim symptoms of afib. No bleeding issues on anticoagulation. She is having dental work done in preparation for her eventual aortic valve replacement.   Today, she  denies symptoms of palpitations, chest pain, shortness of breath, orthopnea, PND, lower extremity edema, dizziness, presyncope, syncope, bleeding, or neurologic sequela. The patient is tolerating medications without difficulties and is otherwise without complaint today.    Atrial Fibrillation Risk Factors:  she does have symptoms of sleep apnea. she does not have a history of rheumatic fever. she does not have a  history of alcohol use. The patient does not have a history of early familial atrial fibrillation or other arrhythmias.   Atrial Fibrillation Management history:  Previous antiarrhythmic drugs: dofetilide  Previous cardioversions: none Previous ablations: none Anticoagulation history: Eliquis    Past Medical History:  Diagnosis Date   CKD (chronic kidney disease)    CKD IIIb   Hypertension    Nonischemic cardiomyopathy    Tachy induced CM // TEE 12/2018: EF 30-35 // Echo 06/2019: EF 55-60 // Cor CTA 8/18: Ca score 0; no CAD   PAF (paroxysmal atrial fibrillation) (HCC)    Severe aortic stenosis     ROS- All systems are reviewed and negative except as per the HPI above.  Physical Exam: Vitals:   06/19/24 0817  BP: 122/80  Pulse: 64  Weight: 81.9 kg  Height: 5' 6.5 (1.689 m)    GEN: Well nourished, well developed in no acute distress CARDIAC: Regular rate and rhythm, no rubs, gallops, 3/6 systolic murmur RESPIRATORY:  Clear to auscultation without rales, wheezing or rhonchi  ABDOMEN: Soft, non-tender, non-distended EXTREMITIES:  No edema; No deformity    Wt Readings from Last 3 Encounters:  06/19/24 81.9 kg  04/18/24 81.2 kg  03/19/24 81.5 kg    EKG today demonstrates  SR, PAC Vent. rate 64 BPM PR interval 168 ms QRS duration 74 ms QT/QTcB 470/484 ms   Echo 08/27/23 demonstrated  1. 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   2. Left ventricular ejection fraction, by estimation, is 60 to 65%. The  left ventricle has normal function. The left ventricle has no regional  wall motion abnormalities. There is moderate left ventricular hypertrophy.  Left ventricular diastolic parameters are consistent with Grade III diastolic dysfunction (restrictive). Elevated left atrial pressure.   3. Right ventricular systolic function is normal.  The right ventricular  size is normal. Tricuspid regurgitation signal is inadequate for assessing  PA pressure.   4. Left atrial size was severely dilated.   5. The mitral valve is normal in structure. Trivial mitral valve  regurgitation. No evidence of mitral stenosis.   6. The aortic valve is calcified. Aortic valve regurgitation is trivial.  Severe aortic valve stenosis.   7. The inferior vena cava is normal in size with greater than 50%  respiratory variability, suggesting right atrial pressure of 3 mmHg.   Epic records are reviewed at length today  CHA2DS2-VASc Score = 5  The patient's score is based upon: CHF History: 1 HTN History: 1 Diabetes History: 0 Stroke History: 0 Vascular Disease History: 1 (aoritc atherosclerosis) Age Score: 1 Gender Score: 1       ASSESSMENT AND PLAN: Persistent Atrial Fibrillation (ICD10:  I48.19) The patient's CHA2DS2-VASc score is 5, indicating a 7.2% annual risk of stroke.   Patient appears to be maintaining SR Continue dofetilide  250 mcg BID Continue Lopressor  25 mg BID Continue Eliquis  5 mg BID Could consider MAZE at the time of her valve surgery   Secondary Hypercoagulable State (ICD10:  D68.69) The patient is at significant risk for stroke/thromboembolism based upon her CHA2DS2-VASc Score of 5.  Continue Apixaban  (Eliquis ). No bleeding issues.   High Risk Medication Monitoring (ICD 10: Z79.899) QT interval on ECG acceptable for dofetilide  monitoring. Check bmet/mag today.     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    Daril Kicks PA-C [facility] [address] [place], [place] [address] [phone] 06/19/2024 8:30 AM

## 2024-06-20 ENCOUNTER — Ambulatory Visit (HOSPITAL_COMMUNITY): Payer: Self-pay | Admitting: Physician Assistant

## 2024-06-20 LAB — MAGNESIUM: Magnesium: 1.9 mg/dL (ref 1.6–2.3)

## 2024-06-20 LAB — BASIC METABOLIC PANEL WITH GFR
BUN/Creatinine Ratio: 17 (ref 12–28)
BUN: 22 mg/dL (ref 8–27)
CO2: 22 mmol/L (ref 20–29)
Calcium: 9.5 mg/dL (ref 8.7–10.3)
Chloride: 102 mmol/L (ref 96–106)
Creatinine, Ser: 1.27 mg/dL — ABNORMAL HIGH (ref 0.57–1.00)
Glucose: 99 mg/dL (ref 70–99)
Potassium: 4.2 mmol/L (ref 3.5–5.2)
Sodium: 141 mmol/L (ref 134–144)
eGFR: 45 mL/min/1.73 — ABNORMAL LOW (ref >59)

## 2024-06-30 ENCOUNTER — Telehealth: Payer: Self-pay

## 2024-06-30 DIAGNOSIS — F418 Other specified anxiety disorders: Secondary | ICD-10-CM

## 2024-06-30 NOTE — Telephone Encounter (Signed)
 Clint,  Got a message that the patient is requesting medications prior to a procedure. I do not see a procedure scheduled. Can you help me out in regards to what procedure she is planning to have done? I see she was cleared for dental extraction on 06/10/2024   Dartha Rozzell pool: can we call and see what procedure she is having done and when?

## 2024-06-30 NOTE — Telephone Encounter (Signed)
 Copied from CRM #[id number]. Topic: Clinical - Medication Question >> Jun 30, 2024  1:15 PM Paige D wrote: Reason for CRM: Pts heart Dr. Odette her to reach out to the office if she can get something prescribed for her to calm her nerves due to her having a procedure this upcoming Monday. She is asking for something to take the morning of the procedure.  Pt would like a call back in regards to this situation and would like a call back in regards to the status of all this.

## 2024-06-30 NOTE — Telephone Encounter (Signed)
 Pt procedure is for extracting 3 teeth. Pt has never had more than one tooth removed and is very nervous. Her dentist recommended her to reach out to PCP for meds to calm her during procedure.  Tooth extraction is being done on Monday.

## 2024-07-01 MED ORDER — LORAZEPAM 0.5 MG PO TABS
ORAL_TABLET | ORAL | 0 refills | Status: DC
Start: 2024-07-01 — End: 2024-10-15

## 2024-07-01 NOTE — Telephone Encounter (Signed)
 If something is prescribed she will have to have a driver for the procedure. I will send in a pill to the pharmacy that she can take approx 30 mins prior to the procedure

## 2024-07-01 NOTE — Telephone Encounter (Signed)
 Contacted pt.  Informed her of script sent in and provided instructions per pcp.  Pt verbalized understanding and has no questions or concerns.

## 2024-07-01 NOTE — Addendum Note (Signed)
 Addended by: WENDEE LYNWOOD HERO on: 07/01/2024 01:19 PM   Modules accepted: Orders

## 2024-07-04 NOTE — Telephone Encounter (Signed)
Patient is calling for update. Please advise

## 2024-07-04 NOTE — Telephone Encounter (Signed)
 S/W pt and confirmed that clearance has been completed and was faxed over 06/10/24 to the surgons office.  Pt stated that the surgeons office has not received the clearance. I informed the pt that I would have the clearance again and call them to confirm that it was received.

## 2024-08-16 ENCOUNTER — Other Ambulatory Visit: Payer: Self-pay | Admitting: Nurse Practitioner

## 2024-08-16 DIAGNOSIS — I5022 Chronic systolic (congestive) heart failure: Secondary | ICD-10-CM

## 2024-08-16 DIAGNOSIS — N1831 Chronic kidney disease, stage 3a: Secondary | ICD-10-CM

## 2024-08-16 DIAGNOSIS — I10 Essential (primary) hypertension: Secondary | ICD-10-CM

## 2024-08-23 ENCOUNTER — Other Ambulatory Visit: Payer: Self-pay | Admitting: Nurse Practitioner

## 2024-08-23 DIAGNOSIS — I5022 Chronic systolic (congestive) heart failure: Secondary | ICD-10-CM

## 2024-09-18 ENCOUNTER — Ambulatory Visit: Admitting: Nurse Practitioner

## 2024-09-18 VITALS — BP 102/60 | HR 62 | Temp 98.2°F | Ht 66.5 in | Wt 183.4 lb

## 2024-09-18 DIAGNOSIS — H1012 Acute atopic conjunctivitis, left eye: Secondary | ICD-10-CM

## 2024-09-18 DIAGNOSIS — E119 Type 2 diabetes mellitus without complications: Secondary | ICD-10-CM | POA: Diagnosis not present

## 2024-09-18 DIAGNOSIS — H1013 Acute atopic conjunctivitis, bilateral: Secondary | ICD-10-CM

## 2024-09-18 LAB — POCT GLYCOSYLATED HEMOGLOBIN (HGB A1C): Hemoglobin A1C: 6.1 % — AB (ref 4.0–5.6)

## 2024-09-18 MED ORDER — NITROGLYCERIN 0.4 MG SL SUBL: 0.4000 mg | SUBLINGUAL_TABLET | SUBLINGUAL | 0 refills | Status: AC | PRN

## 2024-09-18 MED ORDER — OLOPATADINE HCL 0.2 % OP SOLN: 1.0000 [drp] | Freq: Every day | OPHTHALMIC | 2 refills | Status: DC

## 2024-09-18 NOTE — Progress Notes (Signed)
 Established Patient Office Visit  Subjective   Patient ID: Tracy James, female    DOB: 01-15-1951  Age: 73 y.o. MRN: [id number]  Chief Complaint  Patient presents with   Diabetes    Discussed the use of AI scribe software for clinical note transcription with the patient, who gave verbal consent to proceed.  History of Present Illness Tracy James is a 73 year old female with diabetes who presents for a follow-up on her sugar levels and eye symptoms.  She does not monitor her blood sugar levels at home, but her recent A1c was 6.1. She struggles with dietary management, particularly with cravings for sweets, which she attributes to changes in her taste preferences as she ages. Her mother, who was also diabetic, exhibited similar behaviors.  She has been experiencing eye symptoms for a couple of weeks, including swelling and itching, particularly in the left eye. The discharge is clear and watery, with some crusting but no purulent discharge. She has a history of using olopatadine  (Pataday ) eye drops, which have been effective in the past.  Her current medications include Eliquis , atorvastatin , Tikosyn  (dofetilide ), furosemide , and metoprolol . She also uses eye drops for her symptoms.  She engages in water aerobics twice a week for physical activity. She does not receive the flu shot but has had the pneumonia vaccine earlier this year.  No fever, chills, chest pain, or shortness of breath. Bowel movements occur every day or every other day.     Review of Systems  Constitutional:  Negative for chills and fever.  Eyes:  Positive for discharge. Negative for pain and redness.  Respiratory:  Negative for shortness of breath.   Cardiovascular:  Negative for chest pain.  Skin:  Positive for itching.  Neurological:  Negative for headaches.  Psychiatric/Behavioral:  Negative for hallucinations and suicidal ideas.       Objective:     BP 102/60   Pulse 62   Temp 98.2 F (36.8  C) (Oral)   Ht 5' 6.5 (1.689 m)   Wt 183 lb 6.4 oz (83.2 kg)   SpO2 100%   BMI 29.16 kg/m  BP Readings from Last 3 Encounters:  09/18/24 102/60  06/19/24 122/80  04/18/24 108/65   Wt Readings from Last 3 Encounters:  09/18/24 183 lb 6.4 oz (83.2 kg)  06/19/24 180 lb 9.6 oz (81.9 kg)  04/18/24 179 lb (81.2 kg)   SpO2 Readings from Last 3 Encounters:  09/18/24 100%  04/18/24 98%  03/19/24 98%      Physical Exam Vitals and nursing note reviewed.  Constitutional:      Appearance: Normal appearance.  Eyes:     Extraocular Movements: Extraocular movements intact.     Conjunctiva/sclera: Conjunctivae normal.     Pupils: Pupils are equal, round, and reactive to light.     Comments: Periorbital edema. Upper lid only   Cardiovascular:     Rate and Rhythm: Normal rate and regular rhythm.     Heart sounds: Murmur heard.  Pulmonary:     Effort: Pulmonary effort is normal.     Breath sounds: Normal breath sounds.  Lymphadenopathy:     Cervical: No cervical adenopathy.  Neurological:     Mental Status: She is alert.      Results for orders placed or performed in visit on 09/18/24  POCT glycosylated hemoglobin (Hb A1C)  Result Value Ref Range   Hemoglobin A1C 6.1 (A) 4.0 - 5.6 %   HbA1c POC (<> result, manual  entry)     HbA1c, POC (prediabetic range)     HbA1c, POC (controlled diabetic range)        The 10-year ASCVD risk score (Arnett DK, et al., 2019) is: 15.8%    Assessment & Plan:   Problem List Items Addressed This Visit       Endocrine   Well controlled type 2 diabetes mellitus (HCC) - Primary   Relevant Orders   POCT glycosylated hemoglobin (Hb A1C) (Completed)   Other Visit Diagnoses       Allergic conjunctivitis of left eye         Allergic conjunctivitis of both eyes       Relevant Medications   Olopatadine  HCl 0.2 % SOLN      Assessment and Plan Assessment & Plan Type 2 diabetes mellitus Well-controlled with A1c of 6.1. Difficulty  reducing sweet intake noted.  Allergic conjunctivitis Symptoms of eye swelling, itching, and clear watery discharge likely due to seasonal allergies. No infection signs. - Prescribed olopatadine  eye drops. - Advised to monitor for infection signs and report if present. - Consider eye gel (erythromycin) if symptoms persist or worsen.  General Health Maintenance Declined flu vaccination. Received pneumonia vaccination. Engages in water aerobics twice weekly.  Follow-Up - Follow-up in six months for physical examination and laboratory tests unless issues arise.  Return in about 6 months (around 03/19/2025) for CPE and Labs.    Adina Crandall, NP

## 2024-09-18 NOTE — Patient Instructions (Signed)
 Nice to see you today  I have sent in the eye drops and the nitroglycerin  If the eye does not improve or continues to swell or starts having colored discharge call and let me know Follow up with me in 6 months for your physical and labs, sooner if you need me

## 2024-10-06 ENCOUNTER — Telehealth (HOSPITAL_BASED_OUTPATIENT_CLINIC_OR_DEPARTMENT_OTHER): Payer: Self-pay | Admitting: *Deleted

## 2024-10-06 NOTE — Telephone Encounter (Addendum)
 We will need to clarify who is doing the dental procedure. The 1st request came from [facility] .   The request today seems to have come from [facility]   I have confirmed DDS office and will start a new request.

## 2024-10-06 NOTE — Telephone Encounter (Signed)
   Pre-operative Risk Assessment    Patient Name: Tracy James  DOB: November 27, 1951 MRN: [id number]   Date of last office visit: 02/11/24 DR. COOPER Date of next office visit: 10/15/24 DR. COOPER  Request for Surgical Clearance    Procedure:  PT IS HAVING DEEP DENTAL CLEANING (SRP); A FEW DAYS LATER THE PT WILL HAVE 4 TEETH EXTRACTED-SURGICALLY   Date of Surgery:  Clearance TBD                                Surgeon:  DR. CLAYBORNE JUBILEE, DMD Surgeon's Group or Practice Name: [facility] Phone number:  [phone] Fax number:  [phone]   Type of Clearance Requested:   - Medical  - Pharmacy:  Hold Apixaban  (Eliquis )     Type of Anesthesia:  Local    Additional requests/questions:    Bonney Niels Jest   10/06/2024, 12:04 PM

## 2024-10-06 NOTE — Telephone Encounter (Signed)
 Called [facility] at [facility] after receiving another dental clearance. Fax was missing some information so I LMTCB asking for clarification of provider, whether it would be a simple or deep cleaning, and if it was still 3 teeth to be extracted (simple or surgical).

## 2024-10-10 ENCOUNTER — Ambulatory Visit: Payer: Medicare PPO

## 2024-10-10 VITALS — Ht 66.5 in | Wt 183.0 lb

## 2024-10-10 DIAGNOSIS — Z0000 Encounter for general adult medical examination without abnormal findings: Secondary | ICD-10-CM

## 2024-10-10 NOTE — Patient Instructions (Signed)
 [name],  Thank you for taking the time for your Medicare Wellness Visit. I appreciate your continued commitment to your health goals. Please review the care plan we discussed, and feel free to reach out if I can assist you further.  Please note that Annual Wellness Visits do not include a physical exam. Some assessments may be limited, especially if the visit was conducted virtually. If needed, we may recommend an in-person follow-up with your provider.  Ongoing Care Seeing your primary care provider every 3 to 6 months helps us  monitor your health and provide consistent, personalized care.   Referrals If a referral was made during today's visit and you haven't received any updates within two weeks, please contact the referred provider directly to check on the status.  Recommended Screenings:  Health Maintenance  Topic Date Due   Eye exam for diabetics  Never done   DTaP/Tdap/Td vaccine (1 - Tdap) Never done   Zoster (Shingles) Vaccine (1 of 2) Never done   COVID-19 Vaccine (5 - 2025-26 season) 08/04/2024   Medicare Annual Wellness Visit  10/09/2024   Flu Shot  03/03/2025*   Cologuard (Stool DNA test)  10/24/2024   Yearly kidney health urinalysis for diabetes  03/19/2025   Complete foot exam   03/19/2025   Hemoglobin A1C  03/19/2025   Yearly kidney function blood test for diabetes  06/19/2025   Breast Cancer Screening  02/18/2026   Pneumococcal Vaccine for age over 44  Completed   DEXA scan (bone density measurement)  Completed   Hepatitis C Screening  Completed   Meningitis B Vaccine  Aged Out   Colon Cancer Screening  Discontinued  *Topic was postponed. The date shown is not the original due date.       10/10/2024   11:08 AM  Advanced Directives  Does Patient Have a Medical Advance Directive? Yes  Type of Estate Agent of [place];Living will  Copy of Healthcare Power of Attorney in Chart? No - copy requested    Vision: Annual vision screenings  are recommended for early detection of glaucoma, cataracts, and diabetic retinopathy. These exams can also reveal signs of chronic conditions such as diabetes and high blood pressure.  Dental: Annual dental screenings help detect early signs of oral cancer, gum disease, and other conditions linked to overall health, including heart disease and diabetes.

## 2024-10-10 NOTE — Progress Notes (Signed)
 Subjective:   Tracy James is a 73 y.o. female who presents for a Medicare Annual Wellness Visit.  I connected with  Tracy James on 10/10/24 by a audio enabled telemedicine application and verified that I am speaking with the correct person using two identifiers.  Patient Location: Home  Provider Location: Office/Clinic  Persons Participating in Visit: Patient.  I discussed the limitations of evaluation and management by telemedicine. The patient expressed understanding and agreed to proceed.  Vital Signs: Because this visit was a virtual/telehealth visit, some criteria may be missing or patient reported. Any vitals not documented were not able to be obtained and vitals that have been documented are patient reported.   Allergies (verified) Patient has no known allergies.   History: Past Medical History:  Diagnosis Date   CKD (chronic kidney disease)    CKD IIIb   Hypertension    Nonischemic cardiomyopathy    Tachy induced CM // TEE 12/2018: EF 30-35 // Echo 06/2019: EF 55-60 // Cor CTA 8/18: Ca score 0; no CAD   PAF (paroxysmal atrial fibrillation) (HCC)    Severe aortic stenosis    Past Surgical History:  Procedure Laterality Date   COLONOSCOPY WITH PROPOFOL  N/A 03/30/2022   Procedure: COLONOSCOPY WITH PROPOFOL ;  Surgeon: Aneita Gwendlyn DASEN, MD;  Location: [facility] ENDOSCOPY;  Service: Gastroenterology;  Laterality: N/A;   LEFT HEART CATH AND CORONARY ANGIOGRAPHY N/A 03/03/2024   Procedure: LEFT HEART CATH AND CORONARY ANGIOGRAPHY;  Surgeon: Wonda Sharper, MD;  Location: [facility] INVASIVE CV LAB;  Service: Cardiovascular;  Laterality: N/A;   POLYPECTOMY  03/30/2022   Procedure: POLYPECTOMY;  Surgeon: Aneita Gwendlyn DASEN, MD;  Location: [facility] ENDOSCOPY;  Service: Gastroenterology;;   TEE WITHOUT CARDIOVERSION N/A 12/06/2018   Procedure: TRANSESOPHAGEAL ECHOCARDIOGRAM (TEE);  Surgeon: Pietro Redell RAMAN, MD;  Location: [facility] ENDOSCOPY;  Service: Cardiovascular;  Laterality: N/A;   TEE WITHOUT  CARDIOVERSION N/A 01/20/2019   Procedure: TRANSESOPHAGEAL ECHOCARDIOGRAM (TEE);  Surgeon: Loni Soyla LABOR, MD;  Location: [facility] ENDOSCOPY;  Service: Cardiovascular;  Laterality: N/A;   TONSILLECTOMY     Family History  Problem Relation Age of Onset   Arthritis Mother    Cancer Mother        colon cancer   Diabetes Mother    Hypertension Mother    Heart murmur Mother    Early death Brother    Heart murmur Brother    Lung cancer Brother    Arthritis Maternal Grandmother    Heart disease Maternal Grandmother        massive heart attack   Heart disease Paternal Grandmother    Asthma Brother    Diabetes Brother    Hypertension Brother    Heart murmur Brother    Heart disease Father        [email or web address]   Social History   Occupational History   Not on file  Tobacco Use   Smoking status: Former    Current packs/day: 0.00    Types: Cigarettes    Quit date: 12/05/2011    Years since quitting: 12.8   Smokeless tobacco: Never   Tobacco comments:     Former smoker 06/15/22  Vaping Use   Vaping status: Never Used  Substance and Sexual Activity   Alcohol use: Not Currently    Alcohol/week: 1.0 standard drink of alcohol    Types: 1 Glasses of wine per week    Comment: stop drinking occ 2 months 06/15/22   Drug use: No   Sexual activity:  Not Currently    Birth control/protection: None   Tobacco Counseling Counseling given: Not Answered Tobacco comments:  Former smoker 06/15/22  SDOH Screenings   Food Insecurity: No Food Insecurity (10/10/2024)  Housing: Unknown (10/10/2024)  Transportation Needs: No Transportation Needs (10/10/2024)  Utilities: Not At Risk (10/10/2024)  Alcohol Screen: Low Risk  (10/10/2024)  Depression (PHQ2-9): Low Risk  (10/10/2024)  Financial Resource Strain: Low Risk  (10/10/2024)  Physical Activity: Sufficiently Active (10/10/2024)  Social Connections: Moderately Integrated (10/10/2024)  Stress: No Stress Concern Present (10/10/2024)  Tobacco Use:  Medium Risk (10/10/2024)  Health Literacy: Adequate Health Literacy (10/10/2024)   Depression Screen    10/10/2024   11:02 AM 09/18/2024    8:34 AM 03/19/2024    9:13 AM 02/07/2024   11:25 AM 12/13/2023    2:08 PM 10/10/2023   10:03 AM 08/08/2023    3:02 PM  PHQ 2/9 Scores  PHQ - 2 Score 0 0 0 0 1 0 0  PHQ- 9 Score  0  0     0      Data saved with a previous flowsheet row definition     Goals Addressed   None    Visit info / Clinical Intake: Medicare Wellness Visit Type:: Subsequent Annual Wellness Visit Medicare Wellness Visit Mode:: Telephone If telephone:: video declined If telephone or video:: unable to obtan vitals due to lack of equipment Interpreter Needed?: No Pre-visit prep was completed: yes AWV questionnaire completed by patient prior to visit?: no Living arrangements:: (!) lives alone Patient's Overall Health Status Rating: very good Typical amount of pain: none Does pain affect daily life?: no Are you currently prescribed opioids?: no  Dietary Habits and Nutritional Risks How many meals a day?: 2 Eats fruit and vegetables daily?: yes Most meals are obtained by: preparing own meals In the last 2 weeks, have you had any of the following?: -- (none) Diabetic:: (!) yes Any non-healing wounds?: no How often do you check your BS?: as needed Would you like to be referred to a Nutritionist or for Diabetic Management? : no  Functional Status Activities of Daily Living (to include ambulation/medication): Independent Ambulation: Independent Medication Administration: Independent Home Management: Independent Manage your own finances?: yes Primary transportation is: driving Concerns about vision?: no *vision screening is required for WTM* Concerns about hearing?: no  Fall Screening Falls in the past year?: 0 Number of falls in past year: 0 Was there an injury with Fall?: 0 Fall Risk Category Calculator: 0 Patient Fall Risk Level: Low Fall Risk  Fall Risk Patient  at Risk for Falls Due to: No Fall Risks Fall risk Follow up: Education provided; Falls prevention discussed  Home and Transportation Safety: All rugs have non-skid backing?: (!) no All stairs or steps have railings?: yes Grab bars in the bathtub or shower?: yes Have non-skid surface in bathtub or shower?: (!) no Good home lighting?: yes Regular seat belt use?: yes Hospital stays in the last year:: no  Cognitive Assessment Difficulty concentrating, remembering, or making decisions? : no Will 6CIT or Mini Cog be Completed: yes What year is it?: 0 points What month is it?: 0 points Give patient an address phrase to remember (5 components): [address] [place]  About what time is it?: 0 points Count backwards from 20 to 1: 0 points Say the months of the year in reverse: 0 points Repeat the address phrase from earlier: 0 points 6 CIT Score: 0 points  Advance Directives (For Healthcare) Does Patient Have  a Medical Advance Directive?: Yes Type of Advance Directive: Healthcare Power of [place]; Living will Copy of Healthcare Power of Attorney in Chart?: No - copy requested Copy of Living Will in Chart?: No - copy requested Would patient like information on creating a medical advance directive?: No - Patient declined  Reviewed/Updated  Reviewed/Updated: All        Objective:    Today's Vitals   10/10/24 1057  Weight: 183 lb (83 kg)  Height: 5' 6.5 (1.689 m)   Body mass index is 29.09 kg/m.  Current Medications (verified) Outpatient Encounter Medications as of 10/10/2024  Medication Sig   acetaminophen  (TYLENOL ) 500 MG tablet Take 500 mg by mouth every 8 (eight) hours as needed for moderate pain (pain score 4-6) (for pain.).   apixaban  (ELIQUIS ) 5 MG TABS tablet Take 1 tablet by mouth twice daily   atorvastatin  (LIPITOR) 20 MG tablet Take 1 tablet by mouth once daily   calcium -vitamin D  (OSCAL WITH D) 500-200 MG-UNIT TABS tablet Take 1 tablet by mouth daily.     dofetilide  (TIKOSYN ) 250 MCG capsule Take 1 capsule by mouth twice daily   furosemide  (LASIX ) 40 MG tablet Take 1 tablet by mouth once daily   hydroxypropyl methylcellulose / hypromellose (ISOPTO TEARS / GONIOVISC) 2.5 % ophthalmic solution Place 1 drop into both eyes as needed for dry eyes.   loratadine  (CLARITIN ) 10 MG tablet Take 10 mg by mouth daily as needed for allergies.   LORazepam  (ATIVAN ) 0.5 MG tablet Take 1 pill 30 mins prior to procedure.   metoprolol  tartrate (LOPRESSOR ) 25 MG tablet Take 1 tablet (25 mg total) by mouth 2 (two) times daily.   mometasone  (ELOCON ) 0.1 % ointment Apply 1 application  topically daily as needed (for skin rash).   nitroGLYCERIN  (NITROSTAT ) 0.4 MG SL tablet Place 1 tablet (0.4 mg total) under the tongue every 5 (five) minutes as needed for chest pain.   Olopatadine  HCl 0.2 % SOLN Apply 1 drop to eye daily. One drop to affected eye once daily   Omega-3 1000 MG CAPS Take 1,000 mg by mouth daily.    No facility-administered encounter medications on file as of 10/10/2024.   Hearing/Vision screen Vision Screening - Comments:: UTD w/visits to [employer] Immunizations and Health Maintenance Health Maintenance  Topic Date Due   OPHTHALMOLOGY EXAM  Never done   DTaP/Tdap/Td (1 - Tdap) Never done   Zoster Vaccines- Shingrix (1 of 2) Never done   COVID-19 Vaccine (5 - 2025-26 season) 08/04/2024   Medicare Annual Wellness (AWV)  10/09/2024   Influenza Vaccine  03/03/2025 (Originally 07/04/2024)   Fecal DNA (Cologuard)  10/24/2024   Diabetic kidney evaluation - Urine ACR  03/19/2025   FOOT EXAM  03/19/2025   HEMOGLOBIN A1C  03/19/2025   Diabetic kidney evaluation - eGFR measurement  06/19/2025   Mammogram  02/18/2026   Pneumococcal Vaccine: 50+ Years  Completed   DEXA SCAN  Completed   Hepatitis C Screening  Completed   Meningococcal B Vaccine  Aged Out   Colonoscopy  Discontinued        Assessment/Plan:  This is a routine  wellness examination for [place].  Patient Care Team: Wendee Lynwood HERO, NP as PCP - General (Nurse Practitioner) Wonda Sharper, MD as PCP - Cardiology (Cardiology)  I have personally reviewed and noted the following in the patient's chart:   Medical and social history Use of alcohol, tobacco or illicit drugs  Current medications and supplements including opioid  prescriptions. Functional ability and status Nutritional status Physical activity Advanced directives List of other physicians Hospitalizations, surgeries, and ER visits in previous 12 months Vitals Screenings to include cognitive, depression, and falls Referrals and appointments  No orders of the defined types were placed in this encounter.  In addition, I have reviewed and discussed with patient certain preventive protocols, quality metrics, and best practice recommendations. A written personalized care plan for preventive services as well as general preventive health recommendations were provided to patient.   Erminio LITTIE Saris, LPN   [id number]/[id number]   No follow-ups on file.  After Visit Summary: (Declined) Due to this being a telephonic visit, with patients personalized plan was offered to patient but patient Declined AVS at this time   Nurse Notes: Pt has no concerns or questions. AWV to be made with next CPE Jan 2027. Pt informed to schedule same day simultaneously.

## 2024-10-14 NOTE — Telephone Encounter (Signed)
 SABRA

## 2024-10-15 ENCOUNTER — Ambulatory Visit (HOSPITAL_COMMUNITY)
Admission: RE | Admit: 2024-10-15 | Discharge: 2024-10-15 | Disposition: A | Discharge status: Home / Self Care | Source: Ambulatory Visit | Attending: Internal Medicine | Admitting: Internal Medicine

## 2024-10-15 ENCOUNTER — Encounter: Payer: Self-pay | Admitting: Cardiovascular Disease

## 2024-10-15 ENCOUNTER — Ambulatory Visit: Attending: Student in an Organized Health Care Education/Training Program | Admitting: Cardiovascular Disease

## 2024-10-15 VITALS — BP 136/82 | HR 63 | Ht 66.0 in | Wt 186.4 lb

## 2024-10-15 DIAGNOSIS — I5032 Chronic diastolic (congestive) heart failure: Secondary | ICD-10-CM

## 2024-10-15 DIAGNOSIS — I4819 Other persistent atrial fibrillation: Secondary | ICD-10-CM | POA: Diagnosis not present

## 2024-10-15 DIAGNOSIS — I350 Nonrheumatic aortic (valve) stenosis: Secondary | ICD-10-CM

## 2024-10-15 DIAGNOSIS — I10 Essential (primary) hypertension: Secondary | ICD-10-CM

## 2024-10-15 LAB — ECHOCARDIOGRAM COMPLETE
AR max vel: 0.59 cm2
AV Area VTI: 0.55 cm2
AV Area mean vel: 0.58 cm2
AV Mean grad: 37.8 mmHg
AV Peak grad: 65.1 mmHg
Ao pk vel: 4.03 m/s
Area-P 1/2: 2.11 cm2
MV VTI: 1.36 cm2
P 1/2 time: 374 ms
S' Lateral: 1.8 cm

## 2024-10-15 NOTE — Assessment & Plan Note (Signed)
 The patient is maintaining sinus rhythm on dofetilide  and is tolerating anticoagulation with apixaban .  She denies any bleeding problems.  Will continue current management.

## 2024-10-15 NOTE — Patient Instructions (Signed)
 Medication Instructions:  No medication changes were made at this visit. Continue current regimen.   *If you need a refill on your cardiac medications before your next appointment, please call your pharmacy*  Lab Work: None ordered today. If you have labs (blood work) drawn today and your tests are completely normal, you will receive your results only by: MyChart Message (if you have MyChart) OR A paper copy in the mail If you have any lab test that is abnormal or we need to change your treatment, we will call you to review the results.  Testing/Procedures: None ordered today.  Follow-Up: At [facility], you and your health needs are our priority.  As part of our continuing mission to provide you with exceptional heart care, our providers are all part of one team.  This team includes your primary Cardiologist (physician) and Advanced Practice Providers or APPs (Physician Assistants and Nurse Practitioners) who all work together to provide you with the care you need, when you need it.  Your next appointment:   6 month(s)  Provider:   Ozell Fell, MD

## 2024-10-15 NOTE — Progress Notes (Signed)
 Cardiology Office Note:    Date:  10/15/2024   ID:  Tracy James 03/27/1951, MRN [id number]  PCP:  Tracy Lynwood HERO, NP   [facility] Providers Cardiologist:  Tracy Fell, MD     Referring MD: Tracy Lynwood HERO, NP   Chief Complaint  Patient presents with   Aortic Stenosis    History of Present Illness:    Tracy James is a 73 y.o. female with a hx of persistent atrial fibrillation, tachycardia mediated cardiomyopathy, hypertension, and severe aortic stenosis, presenting for follow-up evaluation.  The patient was evaluated for TAVR in 2022 but treatment was deferred because of the presence of ovarian masses.  This turned out to take a benign course and she was not found to have any significant malignancy.  After multidisciplinary heart team review of her case, her anatomy is unfavorable for TAVR and she is felt to be a better candidate for surgical aortic valve replacement.  She is noted to have a bicuspid valve with minimal calcification and sinus asymmetry.  After that she was evaluated by Dr. Shyrl with cardiac surgery, clinical surveillance was decided upon and she presents today for follow-up evaluation.  Last echo 02/11/2024 showed an LVEF of 60 to 65%, restrictive diastolic filling with grade 3 diastolic dysfunction, severe left atrial dilatation, and a functionally bicuspid aortic valve with peak and mean gradients of 52 and 35 mmHg, dimensionless index 0.12, low stroke-volume index of 19, findings felt to be consistent with paradoxical low-flow low gradient severe aortic stenosis.  The patient is here alone today.  She feels remarkably well.  She does water aerobics at least twice a week.  She has absolutely no symptoms and specifically denies chest pain, chest pressure, lightheadedness, or shortness of breath.  We talked about her previous history of ovarian mass.  I went back and reviewed the notes and she had stability demonstrated over time by MRI testing.   Recommendations were made to do follow-up imaging only if symptoms change.  The patient reports no bleeding or pelvic fullness.  She will discuss things with her primary care physician as well.   Current Medications: Current Meds  Medication Sig   acetaminophen  (TYLENOL ) 500 MG tablet Take 500 mg by mouth every 8 (eight) hours as needed for moderate pain (pain score 4-6) (for pain.).   apixaban  (ELIQUIS ) 5 MG TABS tablet Take 1 tablet by mouth twice daily   atorvastatin  (LIPITOR) 20 MG tablet Take 1 tablet by mouth once daily   calcium -vitamin D  (OSCAL WITH D) 500-200 MG-UNIT TABS tablet Take 1 tablet by mouth daily.    dofetilide  (TIKOSYN ) 250 MCG capsule Take 1 capsule by mouth twice daily   furosemide  (LASIX ) 40 MG tablet Take 1 tablet by mouth once daily   hydroxypropyl methylcellulose / hypromellose (ISOPTO TEARS / GONIOVISC) 2.5 % ophthalmic solution Place 1 drop into both eyes as needed for dry eyes.   loratadine  (CLARITIN ) 10 MG tablet Take 10 mg by mouth daily as needed for allergies.   metoprolol  tartrate (LOPRESSOR ) 25 MG tablet Take 1 tablet (25 mg total) by mouth 2 (two) times daily.   mometasone  (ELOCON ) 0.1 % ointment Apply 1 application  topically daily as needed (for skin rash).   nitroGLYCERIN  (NITROSTAT ) 0.4 MG SL tablet Place 1 tablet (0.4 mg total) under the tongue every 5 (five) minutes as needed for chest pain.   Olopatadine  HCl 0.2 % SOLN Apply 1 drop to eye daily. One drop to affected eye  once daily   Omega-3 1000 MG CAPS Take 1,000 mg by mouth daily.      Allergies:   Patient has no known allergies.   ROS:   Please see the history of present illness.    All other systems reviewed and are negative.  EKGs/Labs/Other Studies Reviewed:    The following studies were reviewed today: Cardiac Studies & Procedures   ______________________________________________________________________________________________ CARDIAC CATHETERIZATION  CARDIAC CATHETERIZATION  03/03/2024  Conclusion 1.  Mildly calcified aortic valve with peak to peak gradient 46 mmHg and mean gradient 42 mmHg consistent with severe aortic stenosis 2.  Widely patent coronary arteries with tortuosity of the LAD and RCA, but no significant stenoses are present in any of the major coronary vessels  Recommendations: Continue TAVR evaluation.  CTA studies will be performed, then cardiac surgical consultation.  Findings Coronary Findings Diagnostic  Dominance: Right  Left Main Vessel is angiographically normal.  Left Anterior Descending The vessel exhibits minimal luminal irregularities. The vessel is tortuous. The LAD is patent to the apex.  The vessel has no obstructive disease.  The proximal vessel is large, after the first diagonal the LAD becomes tortuous.  There is no stenosis present.  Left Circumflex The vessel exhibits minimal luminal irregularities. The circumflex is patent with no obstructive disease.  The vessel is relatively small in caliber.  Right Coronary Artery The vessel exhibits minimal luminal irregularities. The vessel is tortuous. Patent vessel, dominant, tortuous, with no significant stenosis.  PDA and PLA branches are patent.  Intervention  No interventions have been documented.     ECHOCARDIOGRAM  ECHOCARDIOGRAM COMPLETE 02/11/2024  Narrative ECHOCARDIOGRAM REPORT    Patient Name:   Tracy James Date of Exam: 02/11/2024 Medical Rec #:  [id number]       Height:       66.0 in Accession #:    [id number]      Weight:       184.0 lb Date of Birth:  07-13-1951       BSA:          1.930 m Patient Age:    72 years        BP:           102/72 mmHg Patient Gender: F               HR:           62 bpm. Exam Location:  [address]  Procedure: 2D Echo, Cardiac Doppler and Color Doppler (Both Spectral and Color Flow Doppler were utilized during procedure).  Indications:    I35.9 Aortic Valve Disorder  History:        Patient has prior history of  Echocardiogram examinations, most recent 08/27/2023. CHF; Arrythmias:Atrial Fibrillation. Nonischemic cardiomyopathy.  Sonographer:    Carl Rodgers-Jones RDCS Referring Phys: [id number] Tracy James  IMPRESSIONS   1. Left ventricular ejection fraction, by estimation, is 60 to 65%. The left ventricle has normal function. The left ventricle has no regional wall motion abnormalities. Left ventricular diastolic parameters are consistent with Grade III diastolic dysfunction (restrictive). Elevated left atrial pressure. 2. Right ventricular systolic function is low normal. The right ventricular size is normal. 3. Left atrial size was severely dilated. 4. The mitral valve is normal in structure. Mild mitral valve regurgitation. 5. AV appears functionally bicuspid with fusion of the R and L coronary cusps Peak and mean gradients through the valve are 52 and 35 mm Hg AVA (VTI) is 0.36 cm2. Dimensionless index is 0.12.  Compared to echo from fall 2024, gradients are lower (mean gradient 46 now 35 mm Hg). SVI is now 19 (compared to 40) Overall consistent with low flow, low gradient critical AS. Note: Pt brought back for additional imaging on 02/14/24 to confirm gradients. No significant change.. Aortic valve regurgitation is mild. 6. The inferior vena cava is normal in size with greater than 50% respiratory variability, suggesting right atrial pressure of 3 mmHg.  FINDINGS Left Ventricle: Left ventricular ejection fraction, by estimation, is 60 to 65%. The left ventricle has normal function. The left ventricle has no regional wall motion abnormalities. The left ventricular internal cavity size was normal in size. There is no left ventricular hypertrophy. Left ventricular diastolic parameters are consistent with Grade III diastolic dysfunction (restrictive). Elevated left atrial pressure.  Right Ventricle: The right ventricular size is normal. Right vetricular wall thickness was not assessed. Right  ventricular systolic function is low normal.  Left Atrium: Left atrial size was severely dilated.  Right Atrium: Right atrial size was normal in size.  Pericardium: There is no evidence of pericardial effusion.  Mitral Valve: The mitral valve is normal in structure. Mild mitral valve regurgitation.  Tricuspid Valve: The tricuspid valve is normal in structure. Tricuspid valve regurgitation is trivial.  AV appears functionally bicuspid with fusion of the R and L coronary cusps Peak and mean gradients through the valve are 52 and 35 mm Hg AVA (VTI) is 0.36 cm2. Dimensionless index is 0.12. Compared to echo from fall 2024, gradients are lower (mean gradient 46 now 35 mm Hg). SVI is now 19 (compared to 40) Overall consistent with low flow, low gradient critical AS. Note: Pt brought back for additional imaging on 02/14/24 to confirm gradients. No significant change. Aortic valve regurgitation is mild. Pulmonic Valve: The pulmonic valve was normal in structure. Pulmonic valve regurgitation is mild.  Aorta: The aortic root and ascending aorta are structurally normal, with no evidence of dilitation.  Venous: The inferior vena cava is normal in size with greater than 50% respiratory variability, suggesting right atrial pressure of 3 mmHg.  IAS/Shunts: No atrial level shunt detected by color flow Doppler.   LEFT VENTRICLE PLAX 2D LVIDd:         3.80 cm   Diastology LVIDs:         2.30 cm   LV e' medial:    6.53 cm/s LV PW:         1.10 cm   LV E/e' medial:  19.4 LV IVS:        1.10 cm   LV e' lateral:   8.60 cm/s LVOT diam:     2.00 cm   LV E/e' lateral: 14.7 LV SV:         36 LV SV Index:   19 LVOT Area:     3.14 cm   RIGHT VENTRICLE RV Basal diam:  3.50 cm RV S prime:     8.81 cm/s TAPSE (M-mode): 1.8 cm  LEFT ATRIUM              Index        RIGHT ATRIUM           Index LA diam:        4.70 cm  2.44 cm/m   RA Area:     12.50 cm LA Vol (A2C):   105.0 ml 54.40 ml/m  RA Volume:    31.30 ml  16.22 ml/m LA Vol (A4C):   96.3 ml  49.89  ml/m LA Biplane Vol: 102.0 ml 52.84 ml/m AORTIC VALVE AV Area (Vmax):    0.48 cm AV Area (Vmean):   0.42 cm AV Area (VTI):     0.36 cm AV Vmax:           354.60 cm/s AV Vmean:          274.800 cm/s AV VTI:            1.000 m AV Peak Grad:      50.3 mmHg AV Mean Grad:      32.8 mmHg LVOT Vmax:         54.35 cm/s LVOT Vmean:        36.900 cm/s LVOT VTI:          0.116 m LVOT/AV VTI ratio: 0.12 AI PHT:            478 msec  AORTA Ao Root diam: 2.80 cm Ao Asc diam:  2.90 cm  MITRAL VALVE MV Area (PHT): 3.72 cm     SHUNTS MV Decel Time: 204 msec     Systemic VTI:  0.12 m MV E velocity: 126.50 cm/s  Systemic Diam: 2.00 cm MV A velocity: 63.85 cm/s MV E/A ratio:  1.98  Vina Gull MD Electronically signed by Vina Gull MD Signature Date/Time: 02/15/2024/12:58:50 PM    Final   TEE  ECHO TEE 01/20/2019  Narrative TRANSESOPHOGEAL ECHO REPORT    Patient Name:   LIZET KELSO Date of Exam: 01/20/2019 Medical Rec #:  [id number]       Height:       66.0 in Accession #:    [id number]      Weight:       173.8 lb Date of Birth:  [birth year]       BSA:          1.88 m Patient Age:    67 years        BP:           123/70 mmHg Patient Gender: F               HR:           130 bpm. Exam Location:  Inpatient   Procedure: Transesophageal Echo, Color Doppler, Cardiac Doppler and Intracardiac Opacification Agent  Indications:     Atrial fibrillation 427.31 / I48.91  History:         Patient has prior history of Echocardiogram examinations, most recent 12/06/2018. Aortic Valve Disease; Signs/Symptoms: Murmur; Risk Factors: Hypertension and Current Smoker.  Sonographer:     Annabella James RDCS Referring Phys:  [id number] GLENDIA DASEN WEAVER Diagnosing Phys: Soyla Merck MD   Sonographer Comments: Limited TEE to rule out LAA thrombus. Definity  used to better visualize LAA.     PROCEDURE: After discussion of the risks and  benefits of a TEE, an informed consent was obtained from the patient. Patients was monitored while under deep sedation. Imaged were obtained with the patient in a left lateral decubitus position. The patient's vital signs; including heart rate, blood pressure, and oxygen saturation; remained stable throughout the procedure. The patient developed no complications during the procedure.  IMPRESSIONS   1. The left ventricle has moderate-severely reduced systolic function, with an ejection fraction of 30-35%. The cavity size was normal. 2. Evidence of atrial level shunting detected by color flow Doppler. 3. Small fixed left atrial thrombus on the interior of the left atrial appendage. This was not well seen with 2D echo, however appears present  with Definity  enhancement (see clips 30-33). 4. No right atrial appendage thrombus seen. 5. Cardioversion not performed, referring provider notified.  FINDINGS Left Ventricle: The left ventricle has moderate-severely reduced systolic function, with an ejection fraction of 30-35%. The cavity size was normal. There is no increase in left ventricular wall thickness. Definity  contrast agent was given IV to delineate the left ventricular endocardial borders. Right Ventricle: The right ventricle has normal systolic function. The cavity was normal. There is no increase in right ventricular wall thickness.   Left Atrium: Left atrial size was normal in size. There is a small fixed left atrial thrombus seen adhering to the interior wall of the left atrial appendage. Right Atrium: Right atrial size was normal in size.  Interatrial Septum: Evidence of atrial level shunting detected by color flow Doppler. Pericardium: There is no evidence of pericardial effusion.  AORTIC VALVE AV Vmax:      296.00 cm/s AV Vmean:     227.000 cm/s AV VTI:       0.587 m AV Peak Grad: 35.0 mmHg AV Mean Grad: 22.0 mmHg   Soyla Merck MD Electronically signed by Soyla Merck  MD Signature Date/Time: 01/20/2019/5:00:27 PM    Final    CT SCANS  CT CORONARY MORPH W/CTA COR W/SCORE 04/03/2024  Addendum 04/19/2024  3:46 AM ADDENDUM REPORT: 04/19/2024 03:43  EXAM: OVER-READ INTERPRETATION  CT CHEST  The following report is an over-read performed by radiologist Dr. Oneil Devonshire of [facility] on 04/19/2024. This over-read does not include interpretation of cardiac or coronary anatomy or pathology. The coronary calcium  score/coronary CTA interpretation by the cardiologist is attached.  COMPARISON:  None.  FINDINGS: Cardiovascular: Atherosclerotic calcifications are noted without aneurysmal dilatation.  Mediastinum/Nodes: There are no enlarged lymph nodes within the visualized mediastinum.  Lungs/Pleura: There is no pleural effusion. Mild subpleural fibrosis is noted. No focal infiltrate is seen.  Upper abdomen: No significant findings in the visualized upper abdomen.  Musculoskeletal/Chest wall: No chest wall mass or suspicious osseous findings within the visualized chest.  IMPRESSION: No significant extracardiac findings within the visualized chest.   Electronically Signed By: Oneil Devonshire M.D. On: 04/19/2024 03:43  Narrative CLINICAL DATA:  Severe Aortic Stenosis.  EXAM: Cardiac TAVR CT  TECHNIQUE: A non-contrast, gated CT scan was obtained with axial slices of 2.5 mm through the heart for aortic valve scoring. A 120 kV retrospective, gated, contrast cardiac scan was obtained. Gantry rotation speed was 230 msec and collimation was 0.63 mm. Nitroglycerin  was not given. A delayed scan was obtained to exclude left atrial appendage thrombus. The 3D dataset was reconstructed in systole with motion correction. The 3D data set was reconstructed in 5% intervals of the 0-95% of the R-R cycle. Systolic and diastolic phases were analyzed on a dedicated workstation using MPR, MIP, and VRT modes. The patient received 100 cc of  contrast.  FINDINGS: Image quality: Good.  Noise artifact is: Limited with mild affect on annulus.  Valve Morphology: Three sinus bicuspid aortic valve with left-right raphe (Siever's 1 classification) with heavy thickening but minimal calcification.  Aortic Valve Calcium  score: 433  Aortic valve area: 0.803 cm2  Aortic annular dimension  Phase assessed: 35%  Annular area: 4.92 cm2  Annular perimeter: 81 mm  Max diameter: 29 mm  Min diameter: 22 mm  Annular and subannular calcification: none  Membranous septum length: 5 mm  Angle: LAO 9, CAU 8  Coronary Artery Height above Annulus:  Left Main: 13 mm  Right Coronary: 16  mm  Sinus of Valsalva Height:  Right-coronary: 17 mm  Left-coronary: 18 mm  Sinus of Valsalva Measurements:  Non-coronary: 30 mm  Right-coronary: 27 mm  Left-coronary: 28 mm  Sinotubular Junction: 26 mm  Ascending Thoracic Aorta: 30 mm  Coronary Arteries: Normal coronary origin. Right dominance. The study was performed without use of NTG and is insufficient for plaque evaluation. Please refer to recent cardiac catheterization for coronary assessment.  Cardiac Morphology:  Right Atrium: Right atrial size is mildly dilated.  Right Ventricle: The right ventricular cavity is within normal limits.  Left Atrium: Left atrial size is significantly dilated with no left atrial appendage filling defect.  Left Ventricle: The ventricular cavity size is within normal limits. There are no stigmata of prior infarction. Basal septal hypertrophy, 15 mm.  Pulmonary arteries: Normal in size without proximal filling defect.  Pulmonary veins: Normal pulmonary venous drainage. Short common left ostium.  Pericardium: Normal thickness with no significant effusion or calcium  present.  Mitral Valve: The mitral valve is normal structure without significant calcification.  Aortic atherosclerosis.  Extra-cardiac findings: See attached  radiology report for non-cardiac structures.  IMPRESSION: 1. Severe aortic valve stenosis.  2. Bicuspid aortic valve.  3. There is sinus asymmetry and minimal calcification. 29 mm Evolut may not be ideal. If heart team approach is TAVR, a 26 mm Sapien Valve is recommended.  4. Optimal Fluoroscopic Angle for Delivery: LAO 9, CAU 8  Stanly Leavens MD  Electronically Signed: By: Stanly Leavens M.D. On: 04/03/2024 15:12   CT SCANS  CT CORONARY MORPH W/CTA COR W/SCORE 09/26/2021  Addendum 10/04/2021  6:17 AM ADDENDUM REPORT: 10/04/2021 06:15  ADDENDUM: Extracardiac findings were described separately under dictation for contemporaneously obtained CTA chest, abdomen and pelvis dated 09/26/2021. Please see that report for full description of relevant extracardiac findings.   Electronically Signed By: Toribio Aye M.D. On: 10/04/2021 06:15  Narrative CLINICAL DATA:  Pre-op transcatheter aortic valve replacement (TAVR)  EXAM: Cardiac TAVR CT  TECHNIQUE: The patient was scanned on a Siemens Force 192 slice scanner. A 120 kV retrospective scan was triggered in the descending thoracic aorta at 111 HU's. Gantry rotation speed was 270 msecs and collimation was .9 mm. The 3D data set was reconstructed in 5% intervals of the R-R cycle. Systolic and diastolic phases were analyzed on a dedicated work station using MPR, MIP and VRT modes. The patient received OMNIPAQUE  IOHEXOL  350 MG/ML SOLN of contrast.  FINDINGS: Aortic Valve: Bicuspid aortic valve with raphe between right and left coronary cusps. Severely Reduced cusp separation. Severely thickened, mildly calcified aortic valve cusps.  AV calcium  score: 408  Virtual Basal Annulus Measurements:  Maximum/Minimum Diameter: 29.6 x 21.3 mm  Perimeter: 80.9 mm  Area: 493 mm2  No significant LVOT calcifications.  Based on these measurements, the annulus would be suitable for a 26 mm Sapien 3  valve.  Sinus of Valsalva Measurements: 29 mm x 25 mm  Sinus of Valsalva Height:  Left: 16.5 mm  Right: 19.7 mm  Aorta: Severe aortic atherosclerosis. Conventional 3 vessel branch pattern of aortic arch. No coarctation of the aorta.  Sinotubular Junction:  25 mm  Ascending Thoracic Aorta: 28 mm  Aortic Arch:  24 mm  Descending Thoracic Aorta:  21 mm  Coronary Artery Height above Annulus:  Left Main: 12 mm  Right Coronary: 15.4 mm  Coronary Arteries: Normal coronary artery origins.  Optimum Fluoroscopic Angle for Delivery: LAO 1 CAU 1  Slow flowing contrast in left  atrial appendage however no definite findings for thrombus seen.  IMPRESSION: 1. Bicuspid aortic valve with raphe between right and left coronary cusps. Severely Reduced cusp separation. Severely thickened, mildly calcified aortic valve cusps.  2. AV calcium  score: 408  3. Annulus Area: 493 mm2. No significant LVOT calcifications. The annulus would be suitable for a 26 mm Sapien 3 valve. Recommend Heart Team discussion for TAVR vs. surgical aortic valve replacement.  4.  Sufficient coronary artery height from annulus.  5. Optimum Fluoroscopic Angle for Delivery: LAO 1 CAU 1  6. Slow flowing contrast in left atrial appendage however no definite findings for thrombus seen.  7. Severe aortic atherosclerosis. Conventional 3 vessel branch pattern of aortic arch. No coarctation of the aorta. Normal caliber thoracic aorta.  Electronically Signed: By: Soyla Merck M.D. On: 09/26/2021 17:09   CT SCANS  CT CORONARY MORPH W/CTA COR W/SCORE 07/25/2017  Addendum 07/25/2017  5:11 PM ADDENDUM REPORT: 07/25/2017 17:09  CLINICAL DATA:  73 year old female with past medical history of hypertension, tobacco use and heart murmur who presented to the ED with complaints of ongoing chest pain.  EXAM: Cardiac/Coronary  CT  TECHNIQUE: The patient was scanned on a [employer].  FINDINGS: A  100 kV prospective scan was triggered in the descending thoracic aorta at 111 HU's. Axial non-contrast 3 mm slices were carried out through the heart. The data set was analyzed on a dedicated work station and scored using the Agatson method. Gantry rotation speed was 250 msecs and collimation was .5 mm. 5 mg of iv Metoprolol  and 0.8 mg of sl NTG was given. The 3D data set was reconstructed in 5% intervals of the 67-82 % of the R-R cycle. Diastolic phases were analyzed on a dedicated work station using MPR, MIP and VRT modes. The patient received 80 cc of contrast.  Aorta: Normal size. Mild diffuse calcifications in the aortic arch. No dissection.  Aortic Valve: Trileaflet, unusually thickened leaflets and mild calcifications in the non-coronary cusp. Leaflets opening can't be evaluated as only diastolic images were obtained.  Coronary Arteries:  Normal coronary origin.  Right dominance.  RCA is a large dominant artery that gives rise to PDA and PLVB. There is no plaque.  Left main is a large artery that gives rise to LAD and LCX arteries.  LAD is a large vessel that gives rise to two diagonal branches and has no plaque.  LCX is a non-dominant artery that gives rise to one OM1 branch. There is no plaque.  Other findings:  Normal pulmonary vein drainage into the left atrium.  Normal let atrial appendage without a thrombus.  Normal size of the pulmonary artery.  IMPRESSION: 1. Coronary calcium  score of 0. This was 0 percentile for age and sex matched control.  2. Normal coronary origin with right dominance.  3. No evidence of CAD.  4. Unusually thickened leaflets of the tricuspid aortic valve mild calcifications in the non-coronary cusp. Leaflets opening can't be evaluated as only diastolic images were obtained. An echocardiogram is recommended for further evaluation.  Leim Moose   Electronically Signed By: Leim Moose On: 07/25/2017  17:09  Narrative EXAM: OVER-READ INTERPRETATION  CT CHEST  The following report is an over-read performed by radiologist Dr. Rockey Jacquet [facility] on 07/25/2017. This over-read does not include interpretation of cardiac or coronary anatomy or pathology. The coronary CTA interpretation by the cardiologist is attached.  COMPARISON:  Chest radiograph of earlier today. Diagnostic chest CT of 07/19/2017.  FINDINGS: Vascular: Normal thoracic aortic caliber. Aortic atherosclerosis. No aortic dissection. No central pulmonary embolism, on this non-dedicated study.  Mediastinum/Nodes: 9 mm right paratracheal node is not pathologic by size criteria. No hilar adenopathy.  Lungs/Pleura: No pleural fluid.  Clear imaged lungs.  Upper Abdomen: Normal imaged portions of the liver, spleen, stomach.  Musculoskeletal: No acute osseous abnormality. Mild thoracic spondylosis.  IMPRESSION: 1. No acute findings in the extracardiac imaged chest. 2.  Aortic Atherosclerosis (ICD10-I70.0).  Electronically Signed: By: Rockey Kilts M.D. On: 07/25/2017 16:59     ______________________________________________________________________________________________      EKG:        Recent Labs: 12/13/2023: ALT 19; TSH 1.51 02/12/2024: Hemoglobin 13.0; Platelets 314 06/19/2024: BUN 22; Creatinine, Ser 1.27; Magnesium  1.9; Potassium 4.2; Sodium 141  Recent Lipid Panel    Component Value Date/Time   CHOL 164 12/13/2023 1438   CHOL 144 10/19/2021 1036   TRIG 152.0 (H) 12/13/2023 1438   HDL 50.30 12/13/2023 1438   HDL 40 10/19/2021 1036   CHOLHDL 3 12/13/2023 1438   VLDL 30.4 12/13/2023 1438   LDLCALC 83 12/13/2023 1438   LDLCALC 81 10/19/2021 1036   LDLCALC 95 09/26/2017 0910     Risk Assessment/Calculations:    CHA2DS2-VASc Score = 5   This indicates a 7.2% annual risk of stroke. The patient's score is based upon: CHF History: 1 HTN History: 1 Diabetes History: 0 Stroke  History: 0 Vascular Disease History: 1 (aoritc atherosclerosis) Age Score: 1 Gender Score: 1               Physical Exam:    VS:  BP 136/82 (BP Location: Right Arm, Patient Position: Sitting, Cuff Size: Normal)   Pulse 63   Ht 5' 6 (1.676 m)   Wt 186 lb 6.4 oz (84.6 kg)   SpO2 99%   BMI 30.09 kg/m     Wt Readings from Last 3 Encounters:  10/15/24 186 lb 6.4 oz (84.6 kg)  10/10/24 183 lb (83 kg)  09/18/24 183 lb 6.4 oz (83.2 kg)     GEN:  Well nourished, well developed in no acute distress HEENT: Normal NECK: No JVD; No carotid bruits LYMPHATICS: No lymphadenopathy CARDIAC: RRR, 3/6 harsh crescendo decrescendo murmur at the right upper sternal border RESPIRATORY:  Clear to auscultation without rales, wheezing or rhonchi  ABDOMEN: Soft, non-tender, non-distended MUSCULOSKELETAL:  No edema; No deformity  SKIN: Warm and dry NEUROLOGIC:  Alert and oriented x 3 PSYCHIATRIC:  Normal affect   Assessment & Plan Persistent atrial fibrillation (HCC) The patient is maintaining sinus rhythm on dofetilide  and is tolerating anticoagulation with apixaban .  She denies any bleeding problems.  Will continue current management. Nonrheumatic aortic valve stenosis Patient with severe, stage C aortic stenosis.  She remains completely asymptomatic.  We discussed her situation again today.  She had an echo this morning and it is currently pending.  I will review it as soon as it is interpreted.  With her asymptomatic status, she feels strongly about avoiding surgery.  Will continue with clinical surveillance.  She has met with Dr. Shyrl and is not interested in pursuing surgery unless she develops symptoms.  I counseled her again about the cardinal symptoms of aortic stenosis with keeping an eye out for chest discomfort, shortness of breath, or lightheadedness. Chronic heart failure with preserved ejection fraction (HCC) Clinically stable, NYHA functional class I symptoms.  Continue  observation. Essential hypertension Blood pressure is well-controlled, continue metoprolol .     Medication Adjustments/Labs  and Tests Ordered: Current medicines are reviewed at length with the patient today.  Concerns regarding medicines are outlined above.  No orders of the defined types were placed in this encounter.  No orders of the defined types were placed in this encounter.   Patient Instructions  Medication Instructions:  No medication changes were made at this visit. Continue current regimen.   *If you need a refill on your cardiac medications before your next appointment, please call your pharmacy*  Lab Work: None ordered today. If you have labs (blood work) drawn today and your tests are completely normal, you will receive your results only by: MyChart Message (if you have MyChart) OR A paper copy in the mail If you have any lab test that is abnormal or we need to change your treatment, we will call you to review the results.  Testing/Procedures: None ordered today.  Follow-Up: At [facility], you and your health needs are our priority.  As part of our continuing mission to provide you with exceptional heart care, our providers are all part of one team.  This team includes your primary Cardiologist (physician) and Advanced Practice Providers or APPs (Physician Assistants and Nurse Practitioners) who all work together to provide you with the care you need, when you need it.  Your next appointment:   6 month(s)  Provider:   Ozell Fell, MD      Signed, Tracy Fell, MD  10/15/2024 1:24 PM    [facility]

## 2024-10-16 ENCOUNTER — Ambulatory Visit: Payer: Self-pay | Admitting: Cardiovascular Disease

## 2024-10-16 NOTE — Telephone Encounter (Signed)
     Primary Cardiologist: Ozell Fell, MD  Chart reviewed as part of pre-operative protocol coverage. Given past medical history and time since last visit, based on ACC/AHA guidelines, TEMILOLUWA RECCHIA would be at acceptable risk for the planned procedure without further cardiovascular testing.   Patient has not had an Afib/aflutter ablation in the last 3 months, DCCV within the last 4 weeks or a watchman implanted in the last 45 days    Patient does not require pre-op antibiotics for dental procedure.    Recommend patient can hold Eliquis  for 1 days prior to extractions.  (Do not hold for cleaning) Patient will not need bridging with Lovenox (enoxaparin) around procedure.  I will route this recommendation to the requesting party via Epic fax function and remove from pre-op pool.  Please call with questions.  Josefa HERO. Marykatherine Sherwood NP-C     10/16/2024, 10:15 AM [facility] [address] [place], [place] [address] Office [phone]

## 2024-10-16 NOTE — Telephone Encounter (Signed)
 Thx Kristin - I agree with your recommendation. I'm ok with a 1 day hold is appropriate but her time off of anticoagulation should be minimized for the reasons you outlined. Fortunately she is remaining in sinus rhythm at this time.

## 2024-10-16 NOTE — Telephone Encounter (Signed)
 Patient with diagnosis of atrial fibrillation on Eliquis  for anticoagulation.    Procedure:  PT IS HAVING DEEP DENTAL CLEANING (SRP); A FEW DAYS LATER THE PT WILL HAVE 4 TEETH EXTRACTED-SURGICALLY    Date of Surgery:  Clearance TBD         CHA2DS2-VASc Score = 5   This indicates a 7.2% annual risk of stroke. The patient's score is based upon: CHF History: 1 HTN History: 1 Diabetes History: 0 Stroke History: 0 Vascular Disease History: 1 (aoritc atherosclerosis) Age Score: 1 Gender Score: 1   Chart indicated prior L and R atrial throbmi in 2020.  Repeat echo yesterday still notes small fixed left atrial thrombus on the interior of the left atrial appendage.  No indication of R atrial thrombus  CrCl 53 Platelet count 314  Patient has not had an Afib/aflutter ablation in the last 3 months, DCCV within the last 4 weeks or a watchman implanted in the last 45 days   Patient does not require pre-op antibiotics for dental procedure.  Because thrombus still present on echo, will confirm with primary cardiologist,   Recommend patient can hold Eliquis  for 1 days prior to extractions.  (Do not hold for cleaning) Patient will not need bridging with Lovenox (enoxaparin) around procedure.   **This guidance is not considered finalized until pre-operative APP has relayed final recommendations.**

## 2024-12-15 ENCOUNTER — Ambulatory Visit: Payer: Medicare PPO | Admitting: Nurse Practitioner

## 2024-12-15 ENCOUNTER — Other Ambulatory Visit: Payer: Self-pay | Admitting: Nurse Practitioner

## 2024-12-15 ENCOUNTER — Encounter: Payer: Self-pay | Admitting: Nurse Practitioner

## 2024-12-15 VITALS — BP 112/82 | HR 60 | Temp 97.8°F | Ht 66.0 in | Wt 188.0 lb

## 2024-12-15 DIAGNOSIS — N1832 Chronic kidney disease, stage 3b: Secondary | ICD-10-CM | POA: Diagnosis not present

## 2024-12-15 DIAGNOSIS — R879 Unspecified abnormal finding in specimens from female genital organs: Secondary | ICD-10-CM

## 2024-12-15 DIAGNOSIS — I480 Paroxysmal atrial fibrillation: Secondary | ICD-10-CM

## 2024-12-15 DIAGNOSIS — E1122 Type 2 diabetes mellitus with diabetic chronic kidney disease: Secondary | ICD-10-CM

## 2024-12-15 DIAGNOSIS — E781 Pure hyperglyceridemia: Secondary | ICD-10-CM | POA: Diagnosis not present

## 2024-12-15 DIAGNOSIS — I5022 Chronic systolic (congestive) heart failure: Secondary | ICD-10-CM

## 2024-12-15 DIAGNOSIS — Z0000 Encounter for general adult medical examination without abnormal findings: Secondary | ICD-10-CM | POA: Diagnosis not present

## 2024-12-15 DIAGNOSIS — D6869 Other thrombophilia: Secondary | ICD-10-CM

## 2024-12-15 DIAGNOSIS — L308 Other specified dermatitis: Secondary | ICD-10-CM | POA: Diagnosis not present

## 2024-12-15 DIAGNOSIS — Z1231 Encounter for screening mammogram for malignant neoplasm of breast: Secondary | ICD-10-CM

## 2024-12-15 DIAGNOSIS — Z126 Encounter for screening for malignant neoplasm of bladder: Secondary | ICD-10-CM | POA: Diagnosis not present

## 2024-12-15 DIAGNOSIS — I10 Essential (primary) hypertension: Secondary | ICD-10-CM

## 2024-12-15 DIAGNOSIS — E119 Type 2 diabetes mellitus without complications: Secondary | ICD-10-CM

## 2024-12-15 LAB — COMPREHENSIVE METABOLIC PANEL WITH GFR
ALT: 15 U/L (ref 3–35)
AST: 19 U/L (ref 5–37)
Albumin: 4 g/dL (ref 3.5–5.2)
Alkaline Phosphatase: 83 U/L (ref 39–117)
BUN: 19 mg/dL (ref 6–23)
CO2: 31 meq/L (ref 19–32)
Calcium: 9.3 mg/dL (ref 8.4–10.5)
Chloride: 101 meq/L (ref 96–112)
Creatinine, Ser: 1.15 mg/dL (ref 0.40–1.20)
GFR: 47.96 mL/min — ABNORMAL LOW
Glucose, Bld: 91 mg/dL (ref 70–99)
Potassium: 4.1 meq/L (ref 3.5–5.1)
Sodium: 139 meq/L (ref 135–145)
Total Bilirubin: 0.8 mg/dL (ref 0.2–1.2)
Total Protein: 7.2 g/dL (ref 6.0–8.3)

## 2024-12-15 LAB — TSH: TSH: 1.38 u[IU]/mL (ref 0.35–5.50)

## 2024-12-15 LAB — CBC WITH DIFFERENTIAL/PLATELET
Basophils Absolute: 0 K/uL (ref 0.0–0.1)
Basophils Relative: 0.4 % (ref 0.0–3.0)
Eosinophils Absolute: 0.1 K/uL (ref 0.0–0.7)
Eosinophils Relative: 1.9 % (ref 0.0–5.0)
HCT: 37.4 % (ref 36.0–46.0)
Hemoglobin: 12.4 g/dL (ref 12.0–15.0)
Lymphocytes Relative: 28 % (ref 12.0–46.0)
Lymphs Abs: 2 K/uL (ref 0.7–4.0)
MCHC: 33.3 g/dL (ref 30.0–36.0)
MCV: 86.6 fl (ref 78.0–100.0)
Monocytes Absolute: 0.5 K/uL (ref 0.1–1.0)
Monocytes Relative: 7.5 % (ref 3.0–12.0)
Neutro Abs: 4.4 K/uL (ref 1.4–7.7)
Neutrophils Relative %: 62.2 % (ref 43.0–77.0)
Platelets: 232 K/uL (ref 150.0–400.0)
RBC: 4.31 Mil/uL (ref 3.87–5.11)
RDW: 14.5 % (ref 11.5–15.5)
WBC: 7.1 K/uL (ref 4.0–10.5)

## 2024-12-15 LAB — LIPID PANEL
Cholesterol: 157 mg/dL (ref 28–200)
HDL: 51.4 mg/dL
LDL Cholesterol: 85 mg/dL (ref 10–99)
NonHDL: 105.56
Total CHOL/HDL Ratio: 3
Triglycerides: 103 mg/dL (ref 10.0–149.0)
VLDL: 20.6 mg/dL (ref 0.0–40.0)

## 2024-12-15 LAB — MICROALBUMIN / CREATININE URINE RATIO
Creatinine,U: 11.9 mg/dL
Microalb Creat Ratio: UNDETERMINED mg/g (ref 0.0–30.0)
Microalb, Ur: <0.7 mg/dL

## 2024-12-15 LAB — URINALYSIS, MICROSCOPIC ONLY

## 2024-12-15 NOTE — Assessment & Plan Note (Signed)
Pending lipid panel today 

## 2024-12-15 NOTE — Assessment & Plan Note (Signed)
 Currently on maintained on dofetilide , apixaban , metoprolol .  Followed by cardiology

## 2024-12-15 NOTE — Assessment & Plan Note (Signed)
Pending renal function today 

## 2024-12-15 NOTE — Patient Instructions (Signed)
 Nice to see you today  I will be in touch with the labs once I have them  Follow up with me in 6 months sooner if you need them  Lets switch eye drops and try something called Systane

## 2024-12-15 NOTE — Progress Notes (Signed)
 "  Established Patient Office Visit  Subjective   Patient ID: Tracy James, female    DOB: 11-16-1951  Age: 74 y.o. MRN: [id number]  Chief Complaint  Patient presents with   Annual Exam    HPI  A-fib: Patient currently maintained on apixaban  5 mg twice daily, dofetilide  250 mcg twice daily, metoprolol  25 mg twice daily  CHF: Currently maintained on metoprolol  25 mg twice daily and furosemide  40 mg daily  DM2: Currently maintained on lifestyle modifications only  HLD: Currently maintained on atorvastatin  20 mg daily  for complete physical and follow up of chronic conditions.  Immunizations: -Tetanus: Completed in update at pharmacy  -Influenza: refused  -Shingles: get at local pharmacy  -Pneumonia: Completed 2025  Diet: Fair diet. She is eating 2-3 meals a day. She is trying to cut back to 2 meals a day. She is doing water and some snacks Exercise:  Water aerobics 2 days a week. She will walk every day   Eye exam: Completes annually. Wears glasses. Needs updating   Dental exam: Completes semi-annually    Colonoscopy: Completed in 03/30/2022.  Repeat 7 years.  Patient will be due 2030 Lung Cancer Screening: Former smoker N/A  Pap smear: 10/04/2018, aged out  Mammogram: 02/19/2024, repeat 2 year  DEXA: 02/19/2024, normal BMD  Sleep: going to bed around 630 and she will get up around 430-6.      Review of Systems  Constitutional:  Negative for chills and fever.  Respiratory:  Negative for shortness of breath.   Cardiovascular:  Negative for chest pain and leg swelling.  Gastrointestinal:  Negative for abdominal pain, blood in stool, constipation, diarrhea, nausea and vomiting.       BM daily  Genitourinary:  Negative for dysuria and hematuria.  Neurological:  Negative for tingling and headaches.  Psychiatric/Behavioral:  Negative for hallucinations and suicidal ideas.       Objective:     BP 112/82   Pulse 60   Temp 97.8 F (36.6 C) (Oral)   Ht 5' 6  (1.676 m)   Wt 188 lb (85.3 kg)   SpO2 99%   BMI 30.34 kg/m  BP Readings from Last 3 Encounters:  12/15/24 112/82  10/15/24 136/82  09/18/24 102/60   Wt Readings from Last 3 Encounters:  12/15/24 188 lb (85.3 kg)  10/15/24 186 lb 6.4 oz (84.6 kg)  10/10/24 183 lb (83 kg)   SpO2 Readings from Last 3 Encounters:  12/15/24 99%  10/15/24 99%  09/18/24 100%      Physical Exam Vitals and nursing note reviewed.  Constitutional:      Appearance: Normal appearance.  HENT:     Right Ear: Tympanic membrane, ear canal and external ear normal.     Left Ear: Tympanic membrane, ear canal and external ear normal.     Mouth/Throat:     Mouth: Mucous membranes are moist.     Pharynx: Oropharynx is clear.  Eyes:     Extraocular Movements: Extraocular movements intact.     Pupils: Pupils are equal, round, and reactive to light.  Cardiovascular:     Rate and Rhythm: Normal rate and regular rhythm.     Pulses: Normal pulses.     Heart sounds: Murmur heard.  Pulmonary:     Effort: Pulmonary effort is normal.     Breath sounds: Normal breath sounds.  Abdominal:     General: Bowel sounds are normal. There is no distension.     Palpations: There is  no mass.     Tenderness: There is no abdominal tenderness.     Hernia: No hernia is present.  Musculoskeletal:     Right lower leg: No edema.     Left lower leg: No edema.  Lymphadenopathy:     Cervical: No cervical adenopathy.  Skin:    General: Skin is warm.  Neurological:     General: No focal deficit present.     Mental Status: She is alert.     Deep Tendon Reflexes:     Reflex Scores:      Bicep reflexes are 2+ on the right side and 2+ on the left side.      Patellar reflexes are 2+ on the right side and 2+ on the left side.    Comments: Bilateral upper and lower extremity strength 5/5  Psychiatric:        Mood and Affect: Mood normal.        Behavior: Behavior normal.        Thought Content: Thought content normal.         Judgment: Judgment normal.     Title   Diabetic Foot Exam - detailed Is there a history of foot ulcer?: No Is there a foot ulcer now?: No Is there swelling?: No Is there elevated skin temperature?: No Is there abnormal foot shape?: No Is there a claw toe deformity?: No Are the toenails long?: No Are the toenails thick?: No Are the toenails ingrown?: Yes Pulse Foot Exam completed.: Yes   Right Posterior Tibialis: Present Left posterior Tibialis: Present   Right Dorsalis Pedis: Present Left Dorsalis Pedis: Present     Sensory Foot Exam Completed.: Yes Semmes-Weinstein Monofilament Test + means has sensation and - means no sensation      Image components are not supported.   Image components are not supported. Image components are not supported.  Tuning [place]ork Comments All 10 sites tested sensation intact bilaterally  Bilateral calluses to plantar lateral area     No results found for any visits on 12/15/24.    The 10-year ASCVD risk score (Arnett DK, et al., 2019) is: 18.6%    Assessment & Plan:   Problem List Items Addressed This Visit       Cardiovascular and Mediastinum   Hypertension   Chronic systolic CHF (congestive heart failure) (HCC)   Currently maintained on metoprolol  25 mg twice daily and furosemide  40 mg daily.  Patient followed by cardiology.Most recent echocardiogram was done on 10/15/2024 that showed a EF of 65 to 70% patient is well compensated      Relevant Orders   CBC with Differential/Platelet   Comprehensive metabolic panel with GFR   TSH   Hypercoagulable state due to persistent atrial fibrillation (HCC)   PAF (paroxysmal atrial fibrillation) (HCC)   Currently on maintained on dofetilide , apixaban , metoprolol .  Followed by cardiology        Endocrine   Well controlled type 2 diabetes mellitus (HCC)   Maintained on dietary modifications only.  Most recent A1c within normal limits.      Relevant Orders   CBC with  Differential/Platelet   Comprehensive metabolic panel with GFR   Lipid panel   Microalbumin / creatinine urine ratio     Musculoskeletal and Integument   Eczema   History of the same.  Patient currently maintained on Elocon  cream that has become ineffective.  Currently established with dermatology.  Ambulatory referral to dermatology placed today      Relevant Orders  Ambulatory referral to Dermatology     Genitourinary   Chronic kidney disease, stage 3b (HCC)   Pending renal function today      Relevant Orders   Comprehensive metabolic panel with GFR     Other   Hypertriglyceridemia   Pending lipid panel today      Relevant Orders   Lipid panel   Preventative health care - Primary   Discussed age-appropriate immunizations and screening exams.  Did review patient's personal, surgical, social, family histories.  Patient is up-to-date with all age-appropriate vaccinations she would like.  Patient declined flu vaccine today.  Patient up-to-date on CRC screening and breast cancer screening.  Patient is aged out of cervical cancer screening.  She does have a fibroid uterus that seems stable per imaging but we will refer to GYN just to make sure.  Patient is up-to-date on bone density scan.  Patient was given information at discharge about preventative healthcare maintenance with anticipatory guidance.      Other Visit Diagnoses       Screening for bladder cancer       Relevant Orders   Urine Microscopic     Abnormal tissue in uterus       Relevant Orders   Ambulatory referral to Gynecology       Return in about 6 months (around 06/14/2025) for DM recheck.    Adina Crandall, NP  "

## 2024-12-15 NOTE — Assessment & Plan Note (Signed)
 Maintained on dietary modifications only.  Most recent A1c within normal limits.

## 2024-12-15 NOTE — Assessment & Plan Note (Signed)
 Discussed age-appropriate immunizations and screening exams.  Did review patient's personal, surgical, social, family histories.  Patient is up-to-date with all age-appropriate vaccinations she would like.  Patient declined flu vaccine today.  Patient up-to-date on CRC screening and breast cancer screening.  Patient is aged out of cervical cancer screening.  She does have a fibroid uterus that seems stable per imaging but we will refer to GYN just to make sure.  Patient is up-to-date on bone density scan.  Patient was given information at discharge about preventative healthcare maintenance with anticipatory guidance.

## 2024-12-15 NOTE — Assessment & Plan Note (Signed)
 History of the same.  Patient currently maintained on Elocon  cream that has become ineffective.  Currently established with dermatology.  Ambulatory referral to dermatology placed today

## 2024-12-15 NOTE — Assessment & Plan Note (Signed)
 Currently maintained on metoprolol  25 mg twice daily and furosemide  40 mg daily.  Patient followed by cardiology.Most recent echocardiogram was done on 10/15/2024 that showed a EF of 65 to 70% patient is well compensated

## 2024-12-18 ENCOUNTER — Ambulatory Visit (HOSPITAL_COMMUNITY)
Admission: RE | Admit: 2024-12-18 | Discharge: 2024-12-18 | Disposition: A | Discharge status: Home / Self Care | Source: Ambulatory Visit | Attending: Physician Assistant | Admitting: Physician Assistant

## 2024-12-18 VITALS — BP 170/74 | HR 56 | Ht 66.0 in | Wt 187.0 lb

## 2024-12-18 DIAGNOSIS — Z79899 Other long term (current) drug therapy: Secondary | ICD-10-CM | POA: Diagnosis not present

## 2024-12-18 DIAGNOSIS — I10 Essential (primary) hypertension: Secondary | ICD-10-CM | POA: Diagnosis not present

## 2024-12-18 DIAGNOSIS — I4819 Other persistent atrial fibrillation: Secondary | ICD-10-CM | POA: Diagnosis not present

## 2024-12-18 DIAGNOSIS — I4891 Unspecified atrial fibrillation: Secondary | ICD-10-CM

## 2024-12-18 DIAGNOSIS — Z5181 Encounter for therapeutic drug level monitoring: Secondary | ICD-10-CM | POA: Diagnosis not present

## 2024-12-18 DIAGNOSIS — D6869 Other thrombophilia: Secondary | ICD-10-CM | POA: Diagnosis not present

## 2024-12-18 NOTE — Progress Notes (Signed)
 "   Primary Care Physician: Wendee Lynwood HERO, NP Primary Cardiologist: Dr Wonda Primary Electrophysiologist: Dr Kelsie (previously) Referring Physician: Glendia Ferrier PA-C   Tracy James is a 74 y.o. female with a history of persistent atrial fibrillation, tachycardia induced cardiomyopathy, AS, HTN, and tobacco abuse who presents for follow up in the [facility]. She was admitted in 11/2018 with AF with RVR.  EF was 40-45 on Echo and TEE demonstrated thrombus in the R and L atria and possible thrombus adjacent to the heart.  After a month of anticoagulation, she underwent repeat TEE-DCCV 01/20/2019.  Unfortunately, the TEE continued to show residual clot in the LA.  Therefore, DCCV was not performed.  She was seen in follow up on 02/2019 and her HR was uncontrolled.  Unfortunately, repeat DCCV was not arranged due to COVID-19 restrictions (elective procedures on hold).  She was placed on Digoxin  for rate control. She was admitted 07/15/19-07/18/19 for dofetilide  loading. She is on Eliquis  for stroke prevention.   Patient was hospitalized 03/2022 with lower GI bleed, was not transfused. GI performed a colonoscopy and bleeding felt to be diverticular.   Patient returns for follow up for atrial fibrillation and dofetilide  monitoring. She remains in SR today and feels well. She did have one brief episode of dyspnea on exertion, similar to her previous afib symptoms, a few weeks ago. Otherwise, she has been maintaining SR. No bleeding issues on anticoagulation.   Today, she  denies symptoms of palpitations, chest pain, shortness of breath, orthopnea, PND, lower extremity edema, dizziness, presyncope, syncope, bleeding, or neurologic sequela. The patient is tolerating medications without difficulties and is otherwise without complaint today.    Atrial Fibrillation Risk Factors:  she does have symptoms of sleep apnea. she does not have a history of rheumatic fever. she does not  have a history of alcohol use. The patient does not have a history of early familial atrial fibrillation or other arrhythmias.   Atrial Fibrillation Management history:  Previous antiarrhythmic drugs: dofetilide  Previous cardioversions: none Previous ablations: none Anticoagulation history: Eliquis    Past Medical History:  Diagnosis Date   CKD (chronic kidney disease)    CKD IIIb   Hypertension    Nonischemic cardiomyopathy    Tachy induced CM // TEE 12/2018: EF 30-35 // Echo 06/2019: EF 55-60 // Cor CTA 8/18: Ca score 0; no CAD   PAF (paroxysmal atrial fibrillation) (HCC)    Severe aortic stenosis     ROS- All systems are reviewed and negative except as per the HPI above.  Physical Exam: Vitals:   12/18/24 0820  BP: (!) 170/74  Pulse: (!) 56  Weight: 84.8 kg  Height: 5' 6 (1.676 m)     GEN: Well nourished, well developed in no acute distress CARDIAC: Regular rate and rhythm, no rubs, gallops. 3/6 systolic murmur  RESPIRATORY:  Clear to auscultation without rales, wheezing or rhonchi  ABDOMEN: Soft, non-tender, non-distended EXTREMITIES:  No edema; No deformity    Wt Readings from Last 3 Encounters:  12/18/24 84.8 kg  12/15/24 85.3 kg  10/15/24 84.6 kg    EKG Interpretation Date/Time:  Thursday December 18 2024 08:29:05 EST Ventricular Rate:  56 PR Interval:  172 QRS Duration:  70 QT Interval:  482 QTC Calculation: 465 R Axis:   72  Text Interpretation: Sinus bradycardia with Premature atrial complexes Nonspecific ST abnormality Abnormal QRS-T angle, consider primary T wave abnormality Abnormal ECG When compared with ECG of 19-Jun-2024 08:20, No significant  change was found Confirmed by Gelene Recktenwald ([id number]) on 12/18/2024 8:38:03 AM    Echo 10/15/24 demonstrated   1. Left ventricular ejection fraction, by estimation, is 65 to 70%. Left  ventricular ejection fraction by 3D volume is 70 %. The left ventricle has  normal function. The left ventricle has no  regional wall motion  abnormalities. Left ventricular diastolic   parameters are consistent with Grade III diastolic dysfunction  (restrictive). The average left ventricular global longitudinal strain is  -17.6 %. The global longitudinal strain is abnormal.   2. Right ventricular systolic function is low normal. The right  ventricular size is normal. There is mildly elevated pulmonary artery  systolic pressure. The estimated right ventricular systolic pressure is  33.9 mmHg.   3. Left atrial size was severely dilated.   4. The mitral valve is degenerative. No evidence of mitral valve  regurgitation. No evidence of mitral stenosis.   5. Given SVI 35, there is evidence of severe paradoxical low flow low  gradient AS.SABRA The aortic valve is bicuspid. There is severe calcifcation  of the aortic valve. Aortic valve regurgitation is mild. Severe aortic  valve stenosis. Aortic valve area, by  VTI measures 0.55 cm. Aortic valve mean gradient measures 37.8 mmHg.  Aortic valve Vmax measures 4.03 m/s.   6. The inferior vena cava is normal in size with greater than 50%  respiratory variability, suggesting right atrial pressure of 3 mmHg.   Epic records are reviewed at length today   CHA2DS2-VASc Score = 5  The patient's score is based upon: CHF History: 1 HTN History: 1 Diabetes History: 0 Stroke History: 0 Vascular Disease History: 1 (aoritc atherosclerosis) Age Score: 1 Gender Score: 1       ASSESSMENT AND PLAN: Persistent Atrial Fibrillation (ICD10:  I48.19) The patient's CHA2DS2-VASc score is 5, indicating a 7.2% annual risk of stroke.   Patient appears to be maintaining SR Continue dofetilide  250 mcg BID Continue Lopressor  25 mg BID Continue Eliquis  5 mg BID  Secondary Hypercoagulable State (ICD10:  D68.69) The patient is at significant risk for stroke/thromboembolism based upon her CHA2DS2-VASc Score of 5.  Continue Apixaban  (Eliquis ). No bleeding issues.   High Risk  Medication Monitoring (ICD 10: J342684) Patient requires ongoing monitoring for anti-arrhythmic medication which has the potential to cause life threatening arrhythmias. QT interval on ECG acceptable for dofetilide  monitoring. Check mag today. Recent bmet reviewed.    HFrecEF EF 65-70% GDMT per primary cardiology team Fluid status appears stable today  HTN Unusually elevated today, was well controlled at visit with Dr Wonda and PCP. She will continue to monitor with her home BP machine.   VHD Bicuspid AV with severe stenosis. Followed by Dr Wonda   Follow up in the AF clinic in 6 months.    Daril Kicks PA-C [facility] [address] [place], [place] [address] [phone] 12/18/2024 8:56 AM "

## 2024-12-19 ENCOUNTER — Ambulatory Visit (HOSPITAL_COMMUNITY): Payer: Self-pay | Admitting: Physician Assistant

## 2024-12-19 ENCOUNTER — Ambulatory Visit: Payer: Self-pay | Admitting: Nurse Practitioner

## 2024-12-19 LAB — MAGNESIUM: Magnesium: 1.9 mg/dL (ref 1.6–2.3)

## 2025-02-19 ENCOUNTER — Encounter

## 2025-03-19 ENCOUNTER — Encounter: Admitting: Nurse Practitioner

## 2025-06-15 ENCOUNTER — Ambulatory Visit: Admitting: Nurse Practitioner

## 2025-06-25 ENCOUNTER — Ambulatory Visit (HOSPITAL_COMMUNITY): Admitting: Physician Assistant
